# Patient Record
Sex: Male | Born: 1945
Health system: Southern US, Community
[De-identification: ages and names within clinical notes are randomized; demographics above are authoritative.]

## PROBLEM LIST (undated history)

## (undated) DIAGNOSIS — F29 Unspecified psychosis not due to a substance or known physiological condition: Secondary | ICD-10-CM

## (undated) DIAGNOSIS — I10 Essential (primary) hypertension: Secondary | ICD-10-CM

## (undated) DIAGNOSIS — I639 Cerebral infarction, unspecified: Secondary | ICD-10-CM

## (undated) DIAGNOSIS — F209 Schizophrenia, unspecified: Secondary | ICD-10-CM

## (undated) HISTORY — DX: Essential (primary) hypertension: I10

## (undated) HISTORY — DX: Unspecified psychosis not due to a substance or known physiological condition: F29

## (undated) HISTORY — DX: Cerebral infarction, unspecified: I63.9

## (undated) HISTORY — DX: Schizophrenia, unspecified: F20.9

---

## 1972-12-07 DIAGNOSIS — F209 Schizophrenia, unspecified: Secondary | ICD-10-CM

## 1972-12-07 HISTORY — DX: Schizophrenia, unspecified: F20.9

## 2008-08-26 ENCOUNTER — Encounter (INDEPENDENT_AMBULATORY_CARE_PROVIDER_SITE_OTHER): Payer: Self-pay | Admitting: Internal Medicine

## 2008-08-26 ENCOUNTER — Inpatient Hospital Stay (HOSPITAL_COMMUNITY): Admission: EM | Admit: 2008-08-26 | Discharge: 2008-09-03 | Payer: Self-pay | Admitting: Emergency Medicine

## 2008-08-26 ENCOUNTER — Ambulatory Visit: Payer: Self-pay | Admitting: Internal Medicine

## 2008-09-21 ENCOUNTER — Encounter (INDEPENDENT_AMBULATORY_CARE_PROVIDER_SITE_OTHER): Payer: Self-pay | Admitting: Internal Medicine

## 2008-09-25 ENCOUNTER — Encounter: Payer: Self-pay | Admitting: Internal Medicine

## 2008-10-16 ENCOUNTER — Ambulatory Visit: Payer: Self-pay | Admitting: Infectious Diseases

## 2008-10-16 DIAGNOSIS — F29 Unspecified psychosis not due to a substance or known physiological condition: Secondary | ICD-10-CM | POA: Insufficient documentation

## 2008-10-16 DIAGNOSIS — F1911 Other psychoactive substance abuse, in remission: Secondary | ICD-10-CM

## 2008-10-16 DIAGNOSIS — I10 Essential (primary) hypertension: Secondary | ICD-10-CM | POA: Insufficient documentation

## 2008-10-16 DIAGNOSIS — R269 Unspecified abnormalities of gait and mobility: Secondary | ICD-10-CM

## 2008-10-16 DIAGNOSIS — D649 Anemia, unspecified: Secondary | ICD-10-CM

## 2008-10-16 DIAGNOSIS — R5381 Other malaise: Secondary | ICD-10-CM

## 2008-10-16 DIAGNOSIS — R5383 Other fatigue: Secondary | ICD-10-CM

## 2008-11-15 ENCOUNTER — Ambulatory Visit: Payer: Self-pay | Admitting: Internal Medicine

## 2008-12-07 DIAGNOSIS — I639 Cerebral infarction, unspecified: Secondary | ICD-10-CM

## 2008-12-07 HISTORY — DX: Cerebral infarction, unspecified: I63.9

## 2009-01-02 ENCOUNTER — Telehealth (INDEPENDENT_AMBULATORY_CARE_PROVIDER_SITE_OTHER): Payer: Self-pay | Admitting: *Deleted

## 2009-02-14 ENCOUNTER — Ambulatory Visit: Payer: Self-pay | Admitting: Internal Medicine

## 2009-02-14 ENCOUNTER — Telehealth: Payer: Self-pay | Admitting: Internal Medicine

## 2009-02-14 ENCOUNTER — Encounter: Payer: Self-pay | Admitting: Internal Medicine

## 2009-02-14 LAB — CONVERTED CEMR LAB
BUN: 19 mg/dL (ref 6–23)
CO2: 24 meq/L (ref 19–32)
Chloride: 106 meq/L (ref 96–112)
Creatinine, Ser: 1.52 mg/dL — ABNORMAL HIGH (ref 0.40–1.50)
Glucose, Bld: 106 mg/dL — ABNORMAL HIGH (ref 70–99)

## 2009-03-05 ENCOUNTER — Telehealth (INDEPENDENT_AMBULATORY_CARE_PROVIDER_SITE_OTHER): Payer: Self-pay | Admitting: *Deleted

## 2009-04-01 ENCOUNTER — Telehealth: Payer: Self-pay | Admitting: Internal Medicine

## 2009-04-15 ENCOUNTER — Telehealth: Payer: Self-pay | Admitting: Internal Medicine

## 2009-05-14 ENCOUNTER — Telehealth: Payer: Self-pay | Admitting: Internal Medicine

## 2009-05-29 ENCOUNTER — Telehealth: Payer: Self-pay | Admitting: Internal Medicine

## 2009-06-03 ENCOUNTER — Telehealth: Payer: Self-pay | Admitting: Internal Medicine

## 2009-07-08 ENCOUNTER — Ambulatory Visit: Payer: Self-pay | Admitting: Surgery

## 2009-07-08 ENCOUNTER — Emergency Department (HOSPITAL_COMMUNITY): Admission: EM | Admit: 2009-07-08 | Discharge: 2009-07-08 | Payer: Self-pay | Admitting: Emergency Medicine

## 2009-07-08 ENCOUNTER — Telehealth: Payer: Self-pay | Admitting: *Deleted

## 2009-07-08 ENCOUNTER — Encounter (INDEPENDENT_AMBULATORY_CARE_PROVIDER_SITE_OTHER): Payer: Self-pay | Admitting: Emergency Medicine

## 2009-07-30 ENCOUNTER — Ambulatory Visit: Payer: Self-pay | Admitting: Infectious Disease

## 2009-07-30 ENCOUNTER — Inpatient Hospital Stay (HOSPITAL_COMMUNITY): Admission: EM | Admit: 2009-07-30 | Discharge: 2009-08-05 | Payer: Self-pay | Admitting: Emergency Medicine

## 2009-07-30 ENCOUNTER — Ambulatory Visit: Payer: Self-pay | Admitting: Internal Medicine

## 2009-07-30 ENCOUNTER — Encounter (INDEPENDENT_AMBULATORY_CARE_PROVIDER_SITE_OTHER): Payer: Self-pay | Admitting: Internal Medicine

## 2009-07-31 ENCOUNTER — Encounter: Payer: Self-pay | Admitting: *Deleted

## 2009-08-01 ENCOUNTER — Encounter: Payer: Self-pay | Admitting: Internal Medicine

## 2009-08-02 ENCOUNTER — Ambulatory Visit: Payer: Self-pay | Admitting: Physical Medicine & Rehabilitation

## 2009-08-02 ENCOUNTER — Encounter: Payer: Self-pay | Admitting: Internal Medicine

## 2009-08-02 ENCOUNTER — Ambulatory Visit: Payer: Self-pay | Admitting: Vascular Surgery

## 2009-08-02 ENCOUNTER — Encounter (INDEPENDENT_AMBULATORY_CARE_PROVIDER_SITE_OTHER): Payer: Self-pay | Admitting: Internal Medicine

## 2009-08-05 ENCOUNTER — Inpatient Hospital Stay (HOSPITAL_COMMUNITY)
Admission: RE | Admit: 2009-08-05 | Discharge: 2009-08-16 | Payer: Self-pay | Admitting: Physical Medicine & Rehabilitation

## 2009-08-20 ENCOUNTER — Encounter: Payer: Self-pay | Admitting: *Deleted

## 2009-09-02 ENCOUNTER — Telehealth: Payer: Self-pay | Admitting: Internal Medicine

## 2009-09-03 ENCOUNTER — Encounter
Admission: RE | Admit: 2009-09-03 | Discharge: 2009-09-10 | Payer: Self-pay | Admitting: Physical Medicine & Rehabilitation

## 2009-09-04 ENCOUNTER — Encounter: Payer: Self-pay | Admitting: Internal Medicine

## 2009-09-04 ENCOUNTER — Ambulatory Visit: Payer: Self-pay | Admitting: Vascular Surgery

## 2009-09-10 ENCOUNTER — Ambulatory Visit: Payer: Self-pay | Admitting: Physical Medicine & Rehabilitation

## 2009-10-04 ENCOUNTER — Encounter: Payer: Self-pay | Admitting: Internal Medicine

## 2009-10-17 ENCOUNTER — Ambulatory Visit: Payer: Self-pay | Admitting: Internal Medicine

## 2009-10-17 DIAGNOSIS — I69959 Hemiplegia and hemiparesis following unspecified cerebrovascular disease affecting unspecified side: Secondary | ICD-10-CM

## 2009-10-21 ENCOUNTER — Encounter: Payer: Self-pay | Admitting: Licensed Clinical Social Worker

## 2009-11-04 ENCOUNTER — Encounter: Payer: Self-pay | Admitting: Licensed Clinical Social Worker

## 2009-12-13 ENCOUNTER — Telehealth: Payer: Self-pay | Admitting: Internal Medicine

## 2010-02-14 ENCOUNTER — Telehealth: Payer: Self-pay | Admitting: Internal Medicine

## 2010-02-18 ENCOUNTER — Telehealth: Payer: Self-pay | Admitting: Internal Medicine

## 2010-06-16 ENCOUNTER — Telehealth: Payer: Self-pay | Admitting: Internal Medicine

## 2010-06-17 ENCOUNTER — Telehealth: Payer: Self-pay | Admitting: Internal Medicine

## 2010-07-18 ENCOUNTER — Telehealth (INDEPENDENT_AMBULATORY_CARE_PROVIDER_SITE_OTHER): Payer: Self-pay | Admitting: *Deleted

## 2010-08-06 ENCOUNTER — Ambulatory Visit: Payer: Self-pay | Admitting: Internal Medicine

## 2010-08-06 DIAGNOSIS — R143 Flatulence: Secondary | ICD-10-CM

## 2010-08-06 DIAGNOSIS — R141 Gas pain: Secondary | ICD-10-CM

## 2010-08-06 DIAGNOSIS — R142 Eructation: Secondary | ICD-10-CM

## 2010-08-06 LAB — CONVERTED CEMR LAB

## 2010-08-07 ENCOUNTER — Encounter: Payer: Self-pay | Admitting: Internal Medicine

## 2010-08-07 ENCOUNTER — Telehealth: Payer: Self-pay | Admitting: *Deleted

## 2010-08-13 ENCOUNTER — Ambulatory Visit: Payer: Self-pay | Admitting: Internal Medicine

## 2010-08-13 ENCOUNTER — Ambulatory Visit (HOSPITAL_COMMUNITY): Admission: RE | Admit: 2010-08-13 | Discharge: 2010-08-13 | Payer: Self-pay | Admitting: Internal Medicine

## 2010-08-13 ENCOUNTER — Encounter: Payer: Self-pay | Admitting: Internal Medicine

## 2010-08-13 LAB — CONVERTED CEMR LAB
Basophils Relative: 0 % (ref 0–1)
MCHC: 32.8 g/dL (ref 30.0–36.0)
Monocytes Relative: 11 % (ref 3–12)
Neutro Abs: 2.8 10*3/uL (ref 1.7–7.7)
Neutrophils Relative %: 54 % (ref 43–77)
RBC: 4.04 M/uL — ABNORMAL LOW (ref 4.22–5.81)
WBC: 5.2 10*3/uL (ref 4.0–10.5)

## 2010-08-14 LAB — CONVERTED CEMR LAB
ALT: 17 units/L (ref 0–53)
AST: 19 units/L (ref 0–37)
Albumin: 4.3 g/dL (ref 3.5–5.2)
BUN: 19 mg/dL (ref 6–23)
Calcium: 9.7 mg/dL (ref 8.4–10.5)
Chloride: 107 meq/L (ref 96–112)
Potassium: 4.7 meq/L (ref 3.5–5.3)

## 2010-10-10 ENCOUNTER — Telehealth: Payer: Self-pay | Admitting: Internal Medicine

## 2011-01-06 NOTE — Progress Notes (Signed)
Summary: Refill/gh  Phone Note Refill Request Message from:  Fax from Pharmacy on October 10, 2010 9:32 AM  Refills Requested: Medication #1:  KLOR-CON M20 20 MEQ CR-TABS Take 1 tablet by mouth two times a day   Last Refilled: 09/11/2010  Method Requested: Electronic Initial call taken by: Sander Nephew RN,  October 10, 2010 9:32 AM  Follow-up for Phone Call       Follow-up by: Pershing Cox MD,  October 10, 2010 12:09 PM    Prescriptions: KLOR-CON M20 20 MEQ CR-TABS (POTASSIUM CHLORIDE CRYS CR) Take 1 tablet by mouth two times a day  #60 x 3   Entered and Authorized by:   Pershing Cox MD   Signed by:   Pershing Cox MD on 10/10/2010   Method used:   Electronically to        [facility] [phone]* (retail)       [place], [place]  [id number]       Ph: [phone] or [phone]       Fax: [phone]   RxID:   [id number]

## 2011-01-06 NOTE — Progress Notes (Signed)
Summary: med refill/gp  Phone Note Refill Request Message from:  Fax from Pharmacy on December 13, 2009 10:58 AM  Refills Requested: Medication #1:  KLOR-CON M20 20 MEQ CR-TABS Take 1 tablet by mouth two times a day   Last Refilled: 11/14/2009  Method Requested: Electronic Initial call taken by: Morrison Old RN,  December 13, 2009 10:58 AM  Follow-up for Phone Call       Follow-up by: Pershing Cox MD,  December 13, 2009 11:04 AM    Prescriptions: KLOR-CON M20 20 MEQ CR-TABS (POTASSIUM CHLORIDE CRYS CR) Take 1 tablet by mouth two times a day  #60 x 3   Entered and Authorized by:   Pershing Cox MD   Signed by:   Pershing Cox MD on 12/13/2009   Method used:   Electronically to        [facility] [phone]* (retail)       [place], [place]  [id number]       Ph: [phone] or [phone]       Fax: [phone]   RxID:   [id number]

## 2011-01-06 NOTE — Progress Notes (Signed)
Summary: refil/gg  Phone Note Refill Request  on June 16, 2010 3:23 PM  Refills Requested: Medication #1:  PLAVIX 75 MG TABS Take 1 tablet by mouth once daily..   Last Refilled: 05/14/2010  Medication #2:  HYDROCHLOROTHIAZIDE 25 MG TABS Take 1 tablet by mouth once a day   Last Refilled: 05/14/2010  Medication #3:  LISINOPRIL 20 MG TABS Take 1 tablet by mouth once a day   Last Refilled: 05/14/2010  Method Requested: Electronic Initial call taken by: Gevena Cotton RN,  June 16, 2010 3:23 PM  Follow-up for Phone Call        Pt needs to be seen in the clinic. We last refilled in march so I am not sure who refilled it until june. He has not been seen in the clinic in last 6 months.  Follow-up by: Pershing Cox MD,  June 16, 2010 8:36 PM    Prescriptions: PLAVIX 75 MG TABS (CLOPIDOGREL BISULFATE) Take 1 tablet by mouth once daily.  #30 x 3   Entered and Authorized by:   Pershing Cox MD   Signed by:   Pershing Cox MD on 06/16/2010   Method used:   Electronically to        [facility] [phone]* (retail)       [place], [place]  [id number]       Ph: [phone] or [phone]       Fax: [phone]   RxID:   [id number] HYDROCHLOROTHIAZIDE 25 MG TABS (HYDROCHLOROTHIAZIDE) Take 1 tablet by mouth once a day  #30 x 3   Entered and Authorized by:   Pershing Cox MD   Signed by:   Pershing Cox MD on 06/16/2010   Method used:   Electronically to        [facility] [phone]* (retail)       [place], [place]  [id number]       Ph: [phone] or [phone]       Fax: [phone]   RxID:   [id number] LISINOPRIL 20 MG TABS (LISINOPRIL) Take 1 tablet by mouth once a day  #30 x 3   Entered and Authorized by:   Pershing Cox MD   Signed by:   Pershing Cox MD on 06/16/2010   Method used:   Electronically to        [facility] [phone]* (retail)       [place], [place]  [id number]       Ph: [phone] or [id number]       Fax: [phone]   RxID:   [id number]

## 2011-01-06 NOTE — Progress Notes (Signed)
Summary: REfill/gh  Phone Note Refill Request Message from:  Fax from Pharmacy on February 14, 2010 12:27 PM  Refills Requested: Medication #1:  LISINOPRIL 20 MG TABS Take 1 tablet by mouth once a day   Last Refilled: 01/14/2010  Medication #2:  PLAVIX 75 MG TABS Take 1 tablet by mouth once daily..   Last Refilled: 01/14/2010  Medication #3:  HYDROCHLOROTHIAZIDE 25 MG TABS Take 1 tablet by mouth once a day   Last Refilled: 01/14/2010  Method Requested: Electronic Initial call taken by: Sander Nephew RN,  February 14, 2010 12:29 PM  Follow-up for Phone Call       Follow-up by: Pershing Cox MD,  February 14, 2010 2:11 PM    Prescriptions: METOPROLOL TARTRATE 100 MG TABS (METOPROLOL TARTRATE) Take 1 tablet by mouth two times a day  #60 x 4   Entered and Authorized by:   Pershing Cox MD   Signed by:   Pershing Cox MD on 02/14/2010   Method used:   Electronically to        [facility] [phone]* (retail)       [place], [place]  [id number]       Ph: [phone] or [phone]       Fax: [phone]   RxID:   [id number] PLAVIX 75 MG TABS (CLOPIDOGREL BISULFATE) Take 1 tablet by mouth once daily.  #30 x 3   Entered and Authorized by:   Pershing Cox MD   Signed by:   Pershing Cox MD on 02/14/2010   Method used:   Electronically to        [facility] [phone]* (retail)       [place], [place]  [id number]       Ph: [phone] or [phone]       Fax: [phone]   RxID:   [id number] KLOR-CON M20 20 MEQ CR-TABS (POTASSIUM CHLORIDE CRYS CR) Take 1 tablet by mouth two times a day  #60 x 3   Entered and Authorized by:   Pershing Cox MD   Signed by:   Pershing Cox MD on 02/14/2010   Method used:   Electronically to        [facility] [phone]* (retail)       [place], [place]  PL:[id number]       Ph:  [phone] or [phone]       Fax: [phone]   [id number] HYDROCHLOROTHIAZIDE 25 MG TABS (HYDROCHLOROTHIAZIDE) Take 1 tablet by mouth once a day  #30 x 3   Entered and Authorized by:   Pershing Cox MD   Signed by:   Pershing Cox MD on 02/14/2010   Method used:   Electronically to        [facility] [phone]* (retail)       [place], [place]  [id number]       Ph: [phone] or [id number]       Fax: [phone]   RxID:   [id number] LISINOPRIL 20 MG TABS (LISINOPRIL) Take 1 tablet by mouth once a day  #  30 x 3   Entered and Authorized by:   Pershing Cox MD   Signed by:   Pershing Cox MD on 02/14/2010   Method used:   Electronically to        [facility] [phone]* (retail)       [place], [place]  [id number]       Ph: [phone] or [phone]       Fax: [phone]   RxID:   [id number]

## 2011-01-06 NOTE — Progress Notes (Signed)
Summary: med refill/gp  Phone Note Refill Request Message from:  Fax from Pharmacy on July 18, 2010 10:34 AM  Refills Requested: Medication #1:  METOPROLOL TARTRATE 100 MG TABS Take 1 tablet by mouth two times a day   Last Refilled: 06/16/2010 Last appt. 10/17/09; has an appt. scheduled Aug 30,2011.   Method Requested: Electronic Initial call taken by: Morrison Old RN,  July 18, 2010 10:34 AM    Prescriptions: METOPROLOL TARTRATE 100 MG TABS (METOPROLOL TARTRATE) Take 1 tablet by mouth two times a day  #60 x 6   Entered and Authorized by:   Burman Freestone MD   Signed by:   Burman Freestone MD on 07/18/2010   Method used:   Electronically to        [facility] [phone]* (retail)       [place], [place]  [id number]       Ph: [phone] or [phone]       Fax: [phone]   RxID:   [id number]

## 2011-01-06 NOTE — Progress Notes (Signed)
Summary: refill/ hla  Phone Note Refill Request Message from:  Fax from Pharmacy on February 18, 2010 4:32 PM  Refills Requested: Medication #1:  NEURONTIN 100 MG CAPS one tablet three times daily   Last Refilled: 10/17/2009 Initial call taken by: Freddy Finner RN,  February 18, 2010 4:49 PM  Follow-up for Phone Call       Follow-up by: Pershing Cox MD,  February 20, 2010 1:59 PM    Prescriptions: NEURONTIN 100 MG CAPS (GABAPENTIN) one tablet three times daily  #90 x 3   Entered and Authorized by:   Pershing Cox MD   Signed by:   Pershing Cox MD on 02/20/2010   Method used:   Electronically to        [facility] [phone]* (retail)       [place], [place]  [id number]       Ph: [phone] or [phone]       Fax: [phone]   RxID:   [id number]

## 2011-01-06 NOTE — Progress Notes (Signed)
  Phone Note Outgoing Call   Call placed by: Lucky Rathke NT II,  August 07, 2010 2:23 PM Call placed to: Patient Details for Reason: ABD U/S Summary of Call: [facility] , [place]. THEN CALLED HIS DAUGHTER'S # [phone] LEFT VOICE MESSAGE WITH APPT INFO. LEFT MESSAGE FOR HER TO CALL BACK IF SHE HAS ANY QUESTIONS ABOUT THIS CALL. Lela Sturdivant NT II  August 07, 2010 2:24 PM

## 2011-01-06 NOTE — Progress Notes (Signed)
Summary: refill/ hla  Phone Note Refill Request Message from:  Fax from Pharmacy on June 17, 2010 12:07 PM  Refills Requested: Medication #1:  KLOR-CON M20 20 MEQ CR-TABS Take 1 tablet by mouth two times a day   Dosage confirmed as above?Dosage Confirmed   Last Refilled: 12/9  Medication #2:  NEURONTIN 100 MG CAPS one tablet three times daily   Last Refilled: 6/14 10/17/2009  Initial call taken by: Freddy Finner RN,  June 17, 2010 12:07 PM  Follow-up for Phone Call       Follow-up by: Pershing Cox MD,  June 17, 2010 7:59 PM    Prescriptions: NEURONTIN 100 MG CAPS (GABAPENTIN) one tablet three times daily  #90 x 3   Entered and Authorized by:   Pershing Cox MD   Signed by:   Pershing Cox MD on 06/17/2010   Method used:   Electronically to        [facility] [phone]* (retail)       [place], [place]  [id number]       Ph: [phone] or [phone]       Fax: [phone]   RxID:   [id number] KLOR-CON M20 20 MEQ CR-TABS (POTASSIUM CHLORIDE CRYS CR) Take 1 tablet by mouth two times a day  #60 x 3   Entered and Authorized by:   Pershing Cox MD   Signed by:   Pershing Cox MD on 06/17/2010   Method used:   Electronically to        [facility] [phone]* (retail)       [place], [place]  PL:[id number]       Ph: [phone] or [phone]       Fax: [phone]   RxID:   [id number]

## 2011-01-06 NOTE — Progress Notes (Signed)
  Phone Note Outgoing Call   Call placed by: Lucky Rathke NT II,  August 07, 2010 12:07 PM Call placed to: Patient Details for Reason: ABD ULTRASOUND Summary of Call: [place] [facility] PHONE, CALLED AND NO ANSWER APPT: [facility] 7, 011 AT [place] AT 8:45AM PATIENT TO BE NPO AFTERMIDNIGHT. Lela Sturdivant NT II  August 07, 2010 12:08 PM

## 2011-01-06 NOTE — Progress Notes (Signed)
Summary: Refill/gh  Phone Note Refill Request Message from:  Fax from Pharmacy on October 10, 2010 9:26 AM  Refills Requested: Medication #1:  PLAVIX 75 MG TABS Take 1 tablet by mouth once daily..   Last Refilled: 09/11/2010  Medication #2:  HYDROCHLOROTHIAZIDE 25 MG TABS Take 1 tablet by mouth once a day   Last Refilled: 09/11/2010  Medication #3:  NEURONTIN 100 MG CAPS one tablet three times daily   Last Refilled: 09/11/2010  Medication #4:  LISINOPRIL 20 MG TABS Take 1 tablet by mouth once a day   Last Refilled: 09/11/2010  Method Requested: Electronic Initial call taken by: Sander Nephew RN,  October 10, 2010 9:26 AM  Follow-up for Phone Call       Follow-up by: Pershing Cox MD,  October 10, 2010 12:09 PM    Prescriptions: PLAVIX 75 MG TABS (CLOPIDOGREL BISULFATE) Take 1 tablet by mouth once daily.  #30 x 3   Entered and Authorized by:   Pershing Cox MD   Signed by:   Pershing Cox MD on 10/10/2010   Method used:   Electronically to        [facility] [phone]* (retail)       [place], [place]  [id number]       Ph: [phone] or [phone]       Fax: [phone]   RxID:   [id number] HYDROCHLOROTHIAZIDE 25 MG TABS (HYDROCHLOROTHIAZIDE) Take 1 tablet by mouth once a day  #30 x 3   Entered and Authorized by:   Pershing Cox MD   Signed by:   Pershing Cox MD on 10/10/2010   Method used:   Electronically to        [facility] [phone]* (retail)       [place], [place]  PL:[id number]       Ph: [phone] or [phone]       Fax: [phone]   RxID:   [id number] LISINOPRIL 20 MG TABS (LISINOPRIL) Take 1 tablet by mouth once a day  #30 x 3   Entered and Authorized by:   Pershing Cox MD   Signed by:   Pershing Cox MD on 10/10/2010   Method used:   Electronically to        [facility] [phone]* (retail)       [place], [place]  [id number]       Ph: [phone] or [id number]       Fax: [phone]   RxID:   [id number] FOLIC ACID 1 MG TABS (FOLIC ACID) Take 1 tablet by mouth once a day  #30 x 11   Entered and Authorized by:   Pershing Cox MD   Signed by:   Pershing Cox MD on 10/10/2010   Method used:   Electronically to        [facility] [phone]* (retail)       [place], [place]  PL:[id number]       Ph: [phone] or [phone]       Fax: [phone]   RxID:  [id number] NEURONTIN 100 MG CAPS (GABAPENTIN) one tablet three times daily  #90 x 3   Entered and Authorized by:   Pershing Cox MD   Signed by:   Pershing Cox MD on 10/10/2010   Method used:   Electronically to        [facility] [phone]* (retail)       [place], [place]  [id number]       Ph: [phone] or [phone]       Fax: [phone]   RxID:   [id number]
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
## 2011-02-10 ENCOUNTER — Other Ambulatory Visit: Payer: Self-pay | Admitting: Internal Medicine

## 2011-02-12 ENCOUNTER — Other Ambulatory Visit: Payer: Self-pay | Admitting: *Deleted

## 2011-02-12 MED ORDER — GABAPENTIN 100 MG PO CAPS: 100.0000 mg | ORAL_CAPSULE | Freq: Three times a day (TID) | ORAL | Status: DC

## 2011-02-12 MED ORDER — POTASSIUM CHLORIDE CRYS ER 20 MEQ PO TBCR: 20.0000 meq | EXTENDED_RELEASE_TABLET | Freq: Two times a day (BID) | ORAL | Status: DC

## 2011-02-12 MED ORDER — LISINOPRIL 20 MG PO TABS: 20.0000 mg | ORAL_TABLET | Freq: Every day | ORAL | Status: DC

## 2011-02-12 MED ORDER — HYDROCHLOROTHIAZIDE 25 MG PO TABS: 25.0000 mg | ORAL_TABLET | Freq: Every day | ORAL | Status: DC

## 2011-02-12 MED ORDER — CLOPIDOGREL BISULFATE 75 MG PO TABS: 75.0000 mg | ORAL_TABLET | Freq: Every day | ORAL | Status: DC

## 2011-03-13 LAB — BASIC METABOLIC PANEL
BUN: 19 mg/dL (ref 6–23)
Creatinine, Ser: 1.54 mg/dL — ABNORMAL HIGH (ref 0.4–1.5)
GFR calc non Af Amer: 46 mL/min — ABNORMAL LOW (ref >60)
Potassium: 4 mEq/L (ref 3.5–5.1)

## 2011-03-14 LAB — COMPREHENSIVE METABOLIC PANEL
ALT: 16 U/L (ref 0–53)
ALT: 16 U/L (ref 0–53)
AST: 23 U/L (ref 0–37)
Albumin: 4.1 g/dL (ref 3.5–5.2)
Alkaline Phosphatase: 65 U/L (ref 39–117)
BUN: 23 mg/dL (ref 6–23)
CO2: 24 mEq/L (ref 19–32)
CO2: 25 mEq/L (ref 19–32)
Calcium: 9.5 mg/dL (ref 8.4–10.5)
Chloride: 102 mEq/L (ref 96–112)
Chloride: 106 mEq/L (ref 96–112)
GFR calc Af Amer: 57 mL/min — ABNORMAL LOW (ref >60)
GFR calc non Af Amer: 45 mL/min — ABNORMAL LOW (ref >60)
GFR calc non Af Amer: 47 mL/min — ABNORMAL LOW (ref >60)
Glucose, Bld: 86 mg/dL (ref 70–99)
Potassium: 3.9 mEq/L (ref 3.5–5.1)
Sodium: 135 mEq/L (ref 135–145)
Sodium: 138 mEq/L (ref 135–145)
Total Bilirubin: 0.5 mg/dL (ref 0.3–1.2)
Total Protein: 7.2 g/dL (ref 6.0–8.3)

## 2011-03-14 LAB — DIFFERENTIAL
Basophils Absolute: 0.1 10*3/uL (ref 0.0–0.1)
Basophils Relative: 1 % (ref 0–1)
Basophils Relative: 1 % (ref 0–1)
Eosinophils Absolute: 0.3 10*3/uL (ref 0.0–0.7)
Lymphs Abs: 1.3 10*3/uL (ref 0.7–4.0)
Monocytes Relative: 8 % (ref 3–12)
Neutro Abs: 2.6 10*3/uL (ref 1.7–7.7)
Neutro Abs: 4.8 10*3/uL (ref 1.7–7.7)
Neutrophils Relative %: 49 % (ref 43–77)
Neutrophils Relative %: 70 % (ref 43–77)

## 2011-03-14 LAB — CBC
HCT: 35.5 % — ABNORMAL LOW (ref 39.0–52.0)
Hemoglobin: 11.9 g/dL — ABNORMAL LOW (ref 13.0–17.0)
MCHC: 33.5 g/dL (ref 30.0–36.0)
MCV: 93 fL (ref 78.0–100.0)
Platelets: 257 10*3/uL (ref 150–400)
RBC: 3.74 MIL/uL — ABNORMAL LOW (ref 4.22–5.81)
RBC: 3.82 MIL/uL — ABNORMAL LOW (ref 4.22–5.81)
RDW: 12.2 % (ref 11.5–15.5)
WBC: 6.9 10*3/uL (ref 4.0–10.5)

## 2011-03-14 LAB — BASIC METABOLIC PANEL
BUN: 28 mg/dL — ABNORMAL HIGH (ref 6–23)
BUN: 39 mg/dL — ABNORMAL HIGH (ref 6–23)
CO2: 25 mEq/L (ref 19–32)
CO2: 25 mEq/L (ref 19–32)
Calcium: 9.2 mg/dL (ref 8.4–10.5)
Calcium: 9.8 mg/dL (ref 8.4–10.5)
Chloride: 100 mEq/L (ref 96–112)
Creatinine, Ser: 1.68 mg/dL — ABNORMAL HIGH (ref 0.4–1.5)
Creatinine, Ser: 1.84 mg/dL — ABNORMAL HIGH (ref 0.4–1.5)
Creatinine, Ser: 2.03 mg/dL — ABNORMAL HIGH (ref 0.4–1.5)
GFR calc Af Amer: 45 mL/min — ABNORMAL LOW (ref >60)
GFR calc Af Amer: 50 mL/min — ABNORMAL LOW (ref >60)
GFR calc non Af Amer: 37 mL/min — ABNORMAL LOW (ref >60)
Glucose, Bld: 88 mg/dL (ref 70–99)

## 2011-03-14 LAB — URINALYSIS, ROUTINE W REFLEX MICROSCOPIC
Bilirubin Urine: NEGATIVE
Glucose, UA: NEGATIVE mg/dL
Glucose, UA: NEGATIVE mg/dL
Hgb urine dipstick: NEGATIVE
Ketones, ur: NEGATIVE mg/dL
Ketones, ur: NEGATIVE mg/dL
Protein, ur: NEGATIVE mg/dL
Specific Gravity, Urine: 1.015 (ref 1.005–1.030)
pH: 5 (ref 5.0–8.0)

## 2011-03-14 LAB — URINE CULTURE: Special Requests: NEGATIVE

## 2011-03-14 LAB — IRON AND TIBC
Saturation Ratios: 18 % — ABNORMAL LOW (ref 20–55)
TIBC: 244 ug/dL (ref 215–435)

## 2011-03-14 LAB — POCT I-STAT, CHEM 8
Creatinine, Ser: 1.9 mg/dL — ABNORMAL HIGH (ref 0.4–1.5)
Glucose, Bld: 104 mg/dL — ABNORMAL HIGH (ref 70–99)
Hemoglobin: 12.2 g/dL — ABNORMAL LOW (ref 13.0–17.0)
TCO2: 23 mmol/L (ref 0–100)

## 2011-03-14 LAB — FERRITIN: Ferritin: 488 ng/mL — ABNORMAL HIGH (ref 22–322)

## 2011-03-14 LAB — FOLATE: Folate: >20 ng/mL

## 2011-03-14 LAB — VITAMIN B12: Vitamin B-12: 1622 pg/mL — ABNORMAL HIGH (ref 211–911)

## 2011-03-14 LAB — LIPID PANEL
LDL Cholesterol: 107 mg/dL — ABNORMAL HIGH (ref 0–99)
Total CHOL/HDL Ratio: 4.2 RATIO
Triglycerides: 69 mg/dL (ref <150)
VLDL: 14 mg/dL (ref 0–40)

## 2011-03-14 LAB — GLUCOSE, CAPILLARY: Glucose-Capillary: 120 mg/dL — ABNORMAL HIGH (ref 70–99)

## 2011-03-14 LAB — RAPID URINE DRUG SCREEN, HOSP PERFORMED
Barbiturates: NOT DETECTED
Benzodiazepines: NOT DETECTED

## 2011-03-14 LAB — TSH: TSH: 0.45 u[IU]/mL (ref 0.350–4.500)

## 2011-03-14 LAB — CARDIAC PANEL(CRET KIN+CKTOT+MB+TROPI)
CK, MB: 3.9 ng/mL (ref 0.3–4.0)
Relative Index: 2.2 (ref 0.0–2.5)
Troponin I: 0.03 ng/mL (ref 0.00–0.06)

## 2011-03-14 LAB — CREATININE, URINE, RANDOM: Creatinine, Urine: 114.5 mg/dL

## 2011-03-14 LAB — RETICULOCYTES
RBC.: 4.11 MIL/uL — ABNORMAL LOW (ref 4.22–5.81)
Retic Ct Pct: 0.7 % (ref 0.4–3.1)

## 2011-03-14 LAB — HIV ANTIBODY (ROUTINE TESTING W REFLEX): HIV: NONREACTIVE
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
## 2011-04-21 NOTE — Discharge Summary (Signed)
NAMEDMITRI, TSO NO.:  [id number]   MEDICAL RECORD NO.:  [id number]          PATIENT TYPE:  INP   LOCATION:  6705                         FACILITY:  [facility]   PHYSICIAN:  Alcide Evener, MD  DATE OF BIRTH:  [birth year]   DATE OF ADMISSION:  07/30/2009  DATE OF DISCHARGE:                               DISCHARGE SUMMARY   DATE OF DISCHARGE:  August 05, 2009.   DISCHARGE DIAGNOSES:  1. Acute infarct in the left temporal brain and extension into the      left frontal region.  2. Acute renal failure.  3. Normocytic anemia.  4. Hypertension.  5. Remote alcohol abuse.  6. Smoking.   DISCHARGE MEDICATIONS WITH ACCURATE DOSES:  1. Men's multivitamin plus tablets take 1 tablet by mouth once a day.  2. Neurontin 100 mg capsules 1 tablet 3 times a day.  3. Folic acid 1 mg tablets 1 tablet by mouth once daily.  4. Metoprolol tartrate 100 mg tablets 1 tablet by mouth 2 times a day.  5. Lisinopril 20 mg tablets 1 tablet by mouth once a day.  6. Hydrochlorothiazide 25 mg tablets 1 tablet by mouth once a day.  7. Klor-Con M20 20 mEq CR tablets take 1 tablet by mouth 2 times a      day.  8. Plavix 75 mg tablets 1 tablet by mouth once daily.   DISPOSITION AND FOLLOW UP:  The patient is to be transferred to [place] and he also has an appointment with Dr. Manuella Ghazi  on September 13 at 1:50 p.m. and he is to follow with a 2-D Doppler and  echo results and the regular followup visit.   PROCEDURES PERFORMED:  1. CT head without contrast was done on July 30, 2009.  Stable,      noncontrast appearance of the brain and no acute intracranial      abnormality.  2. Chest x-ray done July 30, 2009.  No acute disease.  3. X-ray of rip, right, done on July 31, 2009.  There is no evidence      of any hip fracture or dislocation.  It was negative.  4. MRA head without contrast done on July 31, 2009, suggestive of      acute infarction of the left  temporoparietal lobe extending over      the convexity and anteriorly into the left frontal lobe.  This      pattern arises the possibility of a watershed infarct due to      hypoperfusion in the anterior condition on the left.  Cerebral      emboli are another possibility.  Also, moderate-to-advanced      intracranial atherosclerotic disease most severe in the anterior      circulation.  5. A 2-D echo and Doppler results are pending, look for an addendum to      this discharge summary to no further results of that.   CONSULTATIONS:  No consultations were done during the inpatient. See the  addendum for details.   ADMITTING HISTORY AND PHYSICAL:  Mr. Cappella is a 65 year old man with  past medical history of left extremity weakness, hypertension,  hyperlipidemia, possible schizophrenia accompanied by his sister who  presents with progressive weakness of right leg and numbness of right  arm and leg.  He reports that he has had left extremity weakness for  approximately 9 months and has been using a cane or a walker to get  around.  For the past few days, he has only been able to get to the  bathroom and back and today, he was not able to get out of bed.  He does  note 3-4 falls over the last month and notes his right leg just gave out  on him at 1 time.  He denies any injuries associated with these falls.  Of note, history is inconsistent at times.  He denies headache, vision  change, altered mental status, dysphagia, and loss of consciousness.  Then, asked about his slurred speech and right facial droop, he reports  his speech has been slurred off and on and it is no different from his  usual speech.  In terms of his facial droop, he reports this is a new  finding.  He denies any other complaints including shortness of breath,  cough and nausea, vomiting, diarrhea, and urinary symptoms.   PHYSICAL EXAMINATION AT THE TIME OF PRESENTATION:  VITAL SIGNS:  Temperature 98.4, pulse 107,  blood pressure 150/92, respirations 19, and  oxygen saturation 100% on 2 L.  GENERAL:  Alert, interactive, mild slurred speech with right facial  droop, pleasant, no distress.  HEENT: Normocephalic, atraumatic.  Mucous moist membranes.  Poor  dentition, multiple teeth missing.  NECK:  No carotid bruits.  JVD of left adenopathy.  CARDIOVASCULAR:  Regular rate, slightly irregular rhythm.  Frequent PVCs  on monitor.  No murmurs, rubs, or gallops.  LUNGS:  Clear to auscultation bilaterally.  Normal effort.  ABDOMEN:  Positive bowel sounds, soft, nontender, nondistended.  Ventral  hernia noted.  EXTREMITIES:  No peripheral edema.  Decreased DP pulses bilaterally.  Right lower extremity cooler than left.  Capillary refill appropriate.  Doppler, dorsalis pedis and posterior tibial on right.  Unable to  Doppler the dorsalis pedis pulse on the left.  Doppler, posterior tibial  pulse on the left.  Right hip was externally rotated.  SKIN:  Dry.  NEUROLOGIC:  Alert, oriented x3, slightly dysarthric, right facial  droop.  Cranial nerves II through XII intact except for cranial nerve  VII, which was slightly weak.  Decreased strength in right lower  extremity, completely unable to plantar flex.  Strength with hip flexion  significantly decreased 2/5, strength in right upper extremity 3/5.  Somewhat difficult to assess because he has difficulty with following  even 1-stage commands at times.  Finger-to-nose normal bilaterally.  Reflexes decreased and symmetric.  Unable to assess gait because the  patient is unable to stand.   HOSPITAL COURSE BY PROBLEM:  1. Acute stroke, the left side.  The differential diagnosis that were      considered at the time of admission were unclear, etiology was      unclear and the CT scan was negative for any acute stroke.  We      considered transient ischemic attack, neuropathy, thyroid      dysfunction on electrolyte abnormality as some of the potential      causes  of development of new onset weakness and slurred speech.      Electrolytes were normal.  Thyroid  was normal.  There was no      significant neuropathy noted.  He had an MRI showing significant      vascular disease less than 1 year ago, so he was definitely a risk      factor for another cardiovascular events, so we went ahead and did      an MRI of his head and the results were consistent with acute left-      sided stroke.  The patient was monitored on telemetry initially,      but then he was transferred to floor.  There were no acute events      during the hospital stay.  The patient got physiotherapy and      occupational therapy consults and he was doing well.  The condition      did not deteriorate during the hospital course of stay and the      patient was transferred to the rehab facility of [facility] in view of continued followup with PT and OT.  2. Acute renal failure.  Creatinine during previous admission was 1.5,      so we considered an element of chronic renal failure.  The      creatinine during the hospital a course of stay did not      deteriorate.  It was around 1.5, so we did hold ACE inhibitors and      diuretics for initial one day, but then we restarted them and there      was no deterioration in renal function during the hospital course      of stay.  3. Normocytic anemia.  He was followed up with anemia panel.  Anemia      panel was consistent with low percentage saturation of 18%, mildly      low.  We did not start any medication and decided to continue with      folic acid therapy in view of chronic alcohol abuse.  4. Hypertension.  He continued the metoprolol and ACE inhibitors were      held because of possible acute renal failure, but was restarted at      the time of discharge.  5. Remote alcohol abuse.  We continued him on folic acid.  6. For smoking, counseling was provided during the hospital stay.  7. For DVT prophylaxis, he was started  on heparin.   DISCHARGE LABORATORY DATA AND VITALS:  Temperature 98.5, pulse 76,  respirations 16, systolic blood pressure 123XX123, diastolic 73, and oxygen  saturation 96% on 2 L.   CRP was 0.6.  HIV antibody nonreactive.  TSH was 0.450.  Urine drug  screen was positive for tetrahydrocannabinols.  ESR was high at 35.  CMET is from July 30, 2009, sodium 138, potassium 4.1, chloride 106,  carbon dioxide 24, glucose 109, BUN 14, creatinine 1.51, total bilirubin  0.6, alkaline phosphate 74, SGOT 23, SGPT 16, total protein 7.7, albumin  bound 4.1, and calcium 9.5.  CBC:  WBC 6.9, RBC 3.74, hemoglobin 11.7,  hematocrit 34.5, MCV 92.0, MCHC 33.9, RDW 12.8, and platelets 281.      Janell Quiet, MD  Electronically Signed      Alcide Evener, MD  Electronically Signed    AG/MEDQ  D:  08/01/2009  T:  08/02/2009  Job:  [id number]   cc:   Jessy Oto. Oneida Alar, MD

## 2011-04-21 NOTE — Procedures (Signed)
CAROTID DUPLEX EXAM   INDICATION:  Re-evaluate left carotid.   HISTORY:  Diabetes:  No.  Cardiac:  No.  Hypertension:  No.  Smoking:  Previous.  Previous Surgery:  No.  CV History:  History of mini stroke with right lower extremity residual  symptoms.  Amaurosis Fugax No, Paresthesias No, Hemiparesis Yes                                       RIGHT             LEFT  Brachial systolic pressure:  Brachial Doppler waveforms:         Normal            Normal  Vertebral direction of flow:                          Antegrade  DUPLEX VELOCITIES (cm/sec)  CCA peak systolic                                     88  ECA peak systolic                                     Q000111Q  ICA peak systolic                                     Q000111Q  ICA end diastolic                                     121  PLAQUE MORPHOLOGY:                                    Heterogeneous  PLAQUE AMOUNT:                                        Severe  PLAQUE LOCATION:                                      ICA/ECA/CCA   IMPRESSION:  Doppler velocity suggests an 80-99% stenosis of the left  proximal internal carotid artery.   ___________________________________________  Jessy Oto Fields, MD   CH/MEDQ  D:  09/04/2009  T:  09/04/2009  Job:  [id number]

## 2011-04-21 NOTE — Assessment & Plan Note (Signed)
OFFICE VISIT   Daniel [name], Daniel [name]  DOB:  24-Mar-1946                                       09/04/2009  CHART#:[id number]   Patient is a 65 year old male who was previously seen in the hospital at  the time of a left brain stroke.  This was in late August 2010.  At that  time, he had a fairly dense right hemiplegia and it was decided to wait  several weeks to consider carotid endarterectomy.  Carotid duplex at the  time of his hospitalization showed a 60 to 80% left internal carotid  artery stenosis.   He returns for follow-up today.  He is currently on Plavix for stroke  prophylaxis.  Other atherosclerotic risk factors include hypertension  and elevated cholesterol, smoking, prior stroke.   He reports no new neurologic symptoms since his stroke.  He states he  has made significant improvement in physical therapy and is able to walk  currently with a walker.  He still has some weakness in his right arm or  leg, but they are improved significantly.   MEDICATIONS:  Plavix 75 mg once a day, Crestor 20 mg q.h.s., Lopressor  100 mg twice a day, ferrous sulfate 325 mg twice a day, multivitamin  once a day, Norvasc 5 mg once a day, gabapentin 300 mg t.i.d., folate 1  mg once a day.   ALLERGIES:  He has no known drug allergies.   FAMILY HISTORY:  Unremarkable.   SOCIAL HISTORY:  He is single.  Currently smokes 1 cigarette per day but  has been a heavier smoker in the past.  He slowed down on his smoking  considerably in 1999.  He does not consume alcohol regularly.  He does  have a history of perhaps some type of psychosis or schizophrenia.   REVIEW OF SYSTEMS:  He is 5 foot 6, 170 pounds.  Cardiac, pulmonary, GI,  renal, neurologic, orthopedic, psychiatric, ENT, and hematologic review  of systems are otherwise negative.  Vascular system:  He has some  occasional pain in his legs with walking, but this does not sound like  claudication.   PHYSICAL EXAMINATION:   Blood pressure is 192/93 in the left arm, 172/91  in the right arm.  Pulse is 60 and regular.  HEENT is unremarkable.  Neck:  He has a left carotid bruit.  Carotid pulses are 2+ bilaterally.  Chest is clear to auscultation.  Cardiac:  A regular rate and rhythm  without murmur.  Abdomen is soft, nontender, nondistended.  No mass.  Extremities:  He has 2+ brachial, radial, femoral pulses bilaterally.  He has no lower extremity edema.  He has no ulcerations on the feet.  Neurologic exam shows 4/5 right upper extremity and right lower  extremity motor strength.  He also has a right foot drop.  He has some  difficulty raising his right shoulder and extending his right arm  completely.   I had a lengthy discussion with Daniel [name] today regarding the need for  left carotid endarterectomy for further stroke prophylaxis.  He  currently is refusing this.  I informed him today that his risk of  stroke is higher with medical therapy than with an operation.  He still  continues to refuse the operation.  Also because of the question of  possible mental illness in the past, I discussed  all of these findings  and treatment options with his sister, Daniel [name], who was here for the  office visit today as well.  She understands his risk of stroke also.  Currently patient is refusing to have carotid endarterectomy.  He will  follow up on an as-needed basis.  He will call if he wishes to schedule  this at some point in the future.   Jessy Oto. Fields, MD  Electronically Signed   CEF/MEDQ  D:  09/04/2009  T:  09/05/2009  Job:  [id number]   cc:   Charlett Blake, M.D.  Alcide Evener, MD  Pershing Cox, MD PhD

## 2011-04-21 NOTE — Consult Note (Signed)
Daniel [name], Daniel NO.:  [id number]   MEDICAL RECORD NO.:  [id number]          PATIENT TYPE:  INP   LOCATION:  6705                         FACILITY:  [facility]   PHYSICIAN:  Jessy Oto. Fields, MD  DATE OF BIRTH:  19-Oct-1946   DATE OF CONSULTATION:  08/02/2009  DATE OF DISCHARGE:                                 CONSULTATION   REQUESTING PHYSICIAN:  Alcide Evener, MD   REASON FOR CONSULTATION:  Left internal carotid artery stenosis in the  face of stroke.   HISTORY OF PRESENT ILLNESS:  The patient is a 65 year old male with  recent history of a left brain stroke manifesting with symptoms of right  hemiplegia.  He had a carotid duplex scan recently which showed 60-80%  left internal carotid artery stenosis.  He had no significant right  internal carotid artery stenosis.  Atherosclerotic risk factors include  hypertension and tobacco abuse as well as elevated cholesterol.  The  patient states he has had right-sided weakness for approximately 9  months, but this acutely got worse this week.  He was using a cane to  walk and now is requiring a walker to be ambulatory.   PAST MEDICAL HISTORY:  Psychosis, schizophrenia, renal insufficiency,  anemia.   REVIEW OF SYSTEMS:  Negative for full review of systems including GI,  renal, neuro, pulmonary, cardiac, endocrine, musculoskeletal, overall  general, as well as all other systems.   MEDICATIONS:  Multivitamin, Neurontin, folate, aspirin, Lopressor,  Crestor, Plavix.   SOCIAL HISTORY:  History of alcohol abuse in the past.  Tobacco history  as mentioned above, lives with his sister currently.   PHYSICAL EXAM:  VITAL SIGNS:  Blood pressure is 140/72, heart rate 76,  temperature 98.4.  GENERAL:  Black male in no acute distress, alert, oriented x3.  HEENT:  Unremarkable.  NECK:  2+ carotid pulses bilaterally.  CHEST:  Clear to auscultation.  CARDIAC:  Regular rate and rhythm.  ABDOMEN:  Soft, nontender,  nondistended.  EXTREMITIES:  He has 2+ radial pulses bilaterally.  NEUROLOGIC:  Right upper extremity is 3/5 motor, right lower extremity  is 0/5 motor, left upper extremity is 4/5 motor, left lower extremity  4/5 motor.   Review of his MRI of the brain shows a large frontal and parietal  infarct on the left side.   ASSESSMENT:  Large left parietal, frontal infarct with hemiplegia and  internal carotid artery stenosis on the left side.  I will consider left  carotid endarterectomy in 4-6 weeks as his risk of intracranial  hemorrhage would be high  currently with revascularizations in the setting of this large stroke.  I spoke with the patient and his sister and will schedule followup for  them in 1 month.  Sister was also given the address of our office as  well as the phone number.  He is okay for him to continue on his Plavix  for now for stroke prophylaxis.      Jessy Oto. Fields, MD  Electronically Signed     CEF/MEDQ  D:  08/02/2009  T:  08/03/2009  Job:  [id number]

## 2011-04-21 NOTE — Consult Note (Signed)
Daniel [name], BLYDEN NO.:  [id number]   MEDICAL RECORD NO.:  [id number]          PATIENT TYPE:  INP   LOCATION:  [id number]                         FACILITY:  [facility]   PHYSICIAN:  Felizardo Hoffmann, M.D.  DATE OF BIRTH:  10/30/1947   DATE OF CONSULTATION:  08/29/2008  DATE OF DISCHARGE:                                 CONSULTATION   REQUESTING PHYSICIAN:  Grayland Jack Phifer, MD   REASON FOR CONSULTATION:  Psychosis and mental status changes.   HISTORY OF PRESENT ILLNESS:  [name] is a 65 year old male  admitted to the [facility] on August 25, 2008, due to evaluation  of hypertensive crisis.   [name] presented with bilateral lower extremity weakness.  At home,  he has been having frequent falls.  He also has been displaying  hallucinations.  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  He also demonstrates impaired judgment.   Upon admission, [name] did have a presentation of alcohol dependence  and was placed on the Ativan protocol for withdrawal.   His hallucinations were occurring for approximately 2 weeks.  They have  now resolved.  However, he does have the chronic delusion as mentioned  above.   [name] is cooperative with bedside care.  He is noncombative.  He is  redirectable.   PAST PSYCHIATRIC HISTORY:  In review of the past medical record, there  is no account of [name] psychiatric admission or treatment.  However, his family stated to the general medical team that he was  admitted to [facility] for 2 weeks in the 1980s.   The patient cannot recall what psychotropic medication that he was  receiving.   FAMILY PSYCHIATRIC HISTORY:  None known.   SOCIAL HISTORY:  [name] has been drinking 2 glasses of gin per day  or beer or wine daily.  He resides with his 2 sisters.  Occupation:  Unemployed.  He denies any use of illegal drugs.  However, his urine  drug screen is positive  for marijuana.   Mr. Rhew has not received any medical care for several years.  He is  from [place].  He has 2 brothers and 7 sisters.  He is not married.  He has no children.   PAST MEDICAL HISTORY:  High blood pressure.   MEDICATIONS:  MAR is reviewed.  He is on a multivitamin daily, thiamine  100 mg daily, Haldol 1 mg q.4 h. p.r.n., Ambien 5 mg nightly p.r.n.   ALLERGIES:  He has no known drug allergies.   An MRI without contrast as well as a CT without contrast showed  nonspecific white matter changes.   Sodium 133, BUN 5, creatinine 0.87, glucose 116, WBC 7.5, hemoglobin  11.8, platelet count 278, SGOT 30, SGPT 32.   INR, RPR, HIV, ammonia, folic acid, hepatitis panel, ASA, magnesium,  alcohol, TSH:  All unremarkable.   Urine drug screen positive for marijuana.   REVIEW OF SYSTEMS:  Constitutional,  HEENT,  mouth, neurologic,  psychiatric, cardiovascular, respiratory, gastrointestinal,  genitourinary, skin,  musculoskeletal, hematologic, lymphatic, endocrine,  metabolic:  All unremarkable.   PHYSICAL EXAMINATION:  VITAL SIGNS:  Temperature 97.9, pulse 84,  respiratory rate 20, blood pressure 125/80, O2 saturation on room air  99%.  GENERAL APPEARANCE:  [name] is a middle-aged male sitting up in his  hospital chair with no abnormal involuntary movements.   MENTAL STATUS EXAM:  [name] alternates between eye contact with the  undersigned and scribbling on his notepad.  He is making large lines in  a diagonal pattern.   His attention span is mildly decreased.  His concentration is mildly  decreased.  Affect involves inappropriate laughter at times.  Abstraction testing:  He fails 3/4 proverbs.  His mood is grossly within  normal limits other than the inappropriate laughter that he displays at  times.  On orientation testing, he is completely intact except for the  day of the month.  He thinks it is September 05, 2008.  Memory testing:  3/3 words immediate,  0/3 words on recall.  His speech involves normal  rate and prosody without dysarthria except when his prosody is  interrupted by some inappropriate laughter occasionally.  Thought  process involves some vague speech.  There is no looseness of  associations.  There is occasional illogical thought content, no  thoughts of harming himself, no thoughts of harming others, no  hallucinations.  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  This was thoroughly explored, and it does not  represent a primitive manner in which he describes how he is paid.  He  describes a process of working in [employer] office.   Insight is poor.  Judgment is impaired.   ASSESSMENT:  Axis I:  293.81  Psychotic disorder, not otherwise  specified.  [name] displays at least partial criteria for  schizophrenia, chronic, undifferentiated type.  His delusion is not  likely to respond to antipsychotic medication.    294.9  Unspecified persistent mental disorder, not otherwise  specified.  [name] does have a form of dementia.  It may be primary.  Axis II:  None.  Axis III:  See past medical history.  Axis IV:  General medical primary support group.  Axis V:  30.   [name] does display critical deficits in memory as well as the  ability to appreciate his risks.  He has impaired reasoning.   [name] does not have the capacity for informed consent.  He does not  have the capacity to choose his environment after discharge.   RECOMMENDATIONS:  Would schedule [name] an appointment with an  outpatient psychiatrist.  Clinics available:  [facility] or [facility].  This will allow monitoring of his mental  status and possibly an antipsychotic trial, although please see the  discussion above.   The outpatient psychiatric appointment would also allow further mental  status screening as he continues to receive his thiamine.  Some cases of   alcoholism with nutritional deficit will continue to respond to thiamine  over 3-4 weeks.      Felizardo Hoffmann, M.D.  Electronically Signed     JW/MEDQ  D:  08/29/2008  T:  08/29/2008  Job:  [id number]

## 2011-04-21 NOTE — Discharge Summary (Signed)
NAMEPARLEY, [name] NO.:  [id number]   MEDICAL RECORD NO.:  [id number]          PATIENT TYPE:  INP   LOCATION:  6705                         FACILITY:  [facility]   PHYSICIAN:  Daniel Evener, MD  DATE OF BIRTH:  [birth year]   DATE OF ADMISSION:  07/30/2009  DATE OF DISCHARGE:  08/05/2009                               DISCHARGE SUMMARY   ADDENDUM   DISCHARGE DIAGNOSES:  1. Acute stroke, left sided.  2. Acute renal failure.  3. Normocytic anemia.  4. Hypertension.  5. Remote alcohol abuse.  6. Tobacco abuse.   DISCHARGE MEDICATIONS:  With accurate doses, please see the dictated  discharge on August 01, 2009.   DISPOSITION AND FOLLOWUP:  The patient is to follow up with Dr. Manuella [name] in  the outpatient clinic on August 19, 2009, at 1:50 p.m. and he has to  follow up with the patient's recovery and continue medication  compliance.  The patient also has to follow up with Dr. Ruta [name],  vascular surgeon in 1 month time from the discharge.  The patient's  sister has the information for Dr. Ruta [name]' office and she was  told to schedule an appointment with him in 1 month time.   PROCEDURES PERFORMED:  Other than the procedures that has been dictated  on the previous discharge summary on August 01, 2009, the patient  underwent 2D echocardiography. 2D echocardiography on August 02, 2009,  left ventricle was hyperdynamic with near cavity obliteration and  systole.  The cavity size was normal.  Wall thickness was normal.  Systolic function was vigorous.  The estimated ejection fraction was in  the range of 65-70%.  Doppler parameters are consistent with abnormal  left ventricular relaxation and grade 1 diastolic dysfunction.  Echocardiography, M-mode complete, 2D - spectral Doppler and color  Doppler.  The patient status was inpatient.  Location was bedside.  There was carotid Doppler done on August 02, 2009, study was routine.  The conclusion of the study  was EBI on right demonstrated 0.37 and on  the left ABI demonstrated 0.37.  Other specific details can be found on  the table.  No DVT was noted.  Neurovascular carotid exam for the  Doppler was done on August 02, 2009.  The carotid duplex exam was  suggestive of mild-to-moderate mixed plaque on the posterior wall and  bifurcation extending into the proximal segment of the internal carotid  artery this was on the right side. There was 60-79% stenosis noted in  proximal segment of left internal carotid artery, moderate mixed plaque  noted in the bifurcation and into the proximal internal carotid artery.   CONSULTATIONS:  Dr. Ruta [name], vascular surgeon was consulted for  carotid Doppler abnormalities and he suggested that the patient should  go carotid endarterectomy after 4-6 weeks.   ADMITTING HISTORY OF PRESENT ILLNESS:  Same as dictated on the previous  discharge.   HOSPITAL COURSE:  As dictated in the previous discharge summary on  August 01, 2009.  Other than that, the patient did not have any  significant events over the next  3 days of his hospital stay.  1. Left sided weakness:The patient's slurred speech and the patient's      strength improved with physical therapy during the hospital course      of stay and the patient was transferred to rehab in the [facility] on August 05, 2009.  2. Acute on Chronic RF: The patient's labs improved and on the day of      discharge, his creatinine level was 1.68 as compared to the      creatinine level of 2 before.  The patient was making good recovery      for his loss of renal function as noted by improvement in the      creatinine.   DISCHARGING VITALS AND LABS:  On August 05, 2009, temperature 98.4,  pulse 69, respirations 20, systolic blood pressure [id number], diastolic blood  pressure 81, oxygen saturation 100% on room air.  His labs, sodium 135,  potassium 3.7, chloride 102, carbon dioxide 225, glucose 95, BUN 28,   creatinine 1.68, GFR 41, calcium 9.2.      Daniel Quiet, MD  Electronically Signed      Daniel Evener, MD  Electronically Signed    AG/MEDQ  D:  08/05/2009  T:  08/06/2009  Job:  [id number]   cc:   Daniel [name]. Daniel Alar, MD  Daniel Cox, MD PhD

## 2011-04-24 NOTE — Discharge Summary (Signed)
Daniel [name], YAP NO.:  [id number]   MEDICAL RECORD NO.:  [id number]          PATIENT TYPE:  INP   LOCATION:  3002                         FACILITY:  [facility]   PHYSICIAN:  Evette Doffing, M.D.  DATE OF BIRTH:  10/30/1947   DATE OF ADMISSION:  08/25/2008  DATE OF DISCHARGE:  09/03/2008                               DISCHARGE SUMMARY   DISCHARGE DIAGNOSES:  1. Lower extremity weakness, likely secondary to ischemic dementia.  2. Psychosis with schizophrenia.  3. Hypertension.  4. Hypotension.  5. Vitamin B12 deficiency.  6. Anemia.  7. Nonspecific T-wave abnormalities.   DISCHARGE MEDICATIONS:  1. Metoprolol 100 mg twice daily.  2. Lisinopril 20 mg once daily.  3. Hydrochlorothiazide 25 mg once daily.  4. Amlodipine 5 mg once daily.  5. Aspirin 325 mg once daily.  6. Neurontin 100 mg 3 times a day for nerve pain.  7. Multivitamin 1 tablet daily.  8. K-Dur 40 mEq once daily.  9. Folic acid 1 mg once daily.   DISPOSITION AND FOLLOWUP:  The patient to return to outpatient care to  see Dr. Manuella Ghazi in 1 month.  Appointment date would be obtained by the  patient's family by calling [phone].  Plan to obtain CBC, to follow  anemia and BMET, to follow electrolytes after starting metoprolol and  lisinopril as well as hydrochlorothiazide.   PROCEDURES PERFORMED:  Lumbar puncture was performed by Dr. Manuella Ghazi and Dr.  Ermalinda Memos on the bedside.  Procedure did not result in successful obtaining  of CSF fluid for further analysis.  The patient had no complications  from the procedure.  The patient then was referred to Interventional  Radiology for lumbar puncture under fluoroscopy.  The patient had  collection of CSF for analysis under Interventional Radiology procedure.   CONSULTATIONS:  Psychiatry, Daniel [name], M.D. from the [facility].   BRIEF ADMITTING HISTORY AND PHYSICAL:  A 65 year old African-American  male without known significant medical  history, who presented to  emergency department with complaint of fall and progressive weakness for  last 1 week.  The patient had 3 falls in last week.  The last fall  happened on the day of admission.  In the morning, the patient fell down  near bathtub, could not easily get up, and required family help.  Family  called EMS, and he was brought to the emergency department for further  evaluation.  The patient remained conscious before the fall and after  the fall.  The patient was hurt on the left hand.  The patient denies  having chest pain, shortness of breath, nausea or vomiting, diarrhea,  urinary incontinence, headache, loss of consciousness, or seizure-like  activity.  The patient was incoherent at the time of admission.  Sister  who came to the ER to drop him off was contacted on the phone.  Further  history was acquired from sister.  Sister informed us that he was having  severe gait disturbance for last 1 week.  He has a history of substance  abuse including marijuana and alcohol.  She also confirmed  all of the  history obtained from the patient.   ALLERGIES:  The patient has no known drug allergies.   PHYSICAL EXAMINATION:  VITALS:  On presentation, temperature 97.4, blood  pressure 180/91, pulse of 116, respiratory rate of 18, O2 saturation of  99% on room air.  GENERAL:  The patient appeared disheveled, sitting on the edge of the  bed and uncomfortable.  HEENT:  EOMI, muddy sclerae, PERRLA.  NECK:  Supple, full range of motion.  No prominent mass palpitated.  RESPIRATION:  Good air entry bilaterally.  No wheezing.  CARDIOVASCULAR:  Normal S1, S2.  No murmur.  GI:  Soft and nontender belly, no varicose veins, no fluid observed.  No  hepatosplenomegaly.  EXTREMITIES:  Left hand bruises and leg bruises.  No pedal edema.  SKIN:  No rashes.  LYMPH NODE:  No lymphadenopathy.  NEUROLOGICAL:  No focal deficits.  Cranial nerves were grossly normal.  Motor 5/5, hypotonia, and  decreased tone in the legs.  Reflexes are  present.  Sensation normal in both legs and both arms.  Gait abnormal,  wide, spastic, and unsteady.  PSYCH:  Normal affect and mood.   LABORATORY DATA:  Sodium 136, potassium 3.5, chloride 106, bicarb of 17,  BUN of 3, creatinine of 0.9, glucose of 107, anion gap of 13, bilirubin  1.1, alkaline phosphatase 118, SGOT 40, SGPT 32, protein 6.7, albumin  3.5, calcium 8.9.  UDS positive for THC, alcohol level less than 5.  Urinalysis clear.  Point-of-care cardiac enzymes, myoglobin of 361,  troponin less than 0.05, CK-MB 8, PT of 13.3, INR is 1, blood ammonia  level is 20.  CT of head, no acute intracranial abnormality.  Chronic  microvascular changes but advanced for age.   Chest x-ray borderline heart size, mild hyperinflation, no active  disease.   Hemoglobin 12.1, white blood cell count of 6.5, platelet of 289, ANC of  4.7, MCV of 106.3.  MRA showed suspicion of high-grade stenosis and  right middle cerebral artery occlusions, possible moderate stenosis of  right vertebrobasilar junction, probable high-grade stenosis of the left  posterior cerebral artery at its proximal aspect.  MRI of the brain  showed no evidence of acute ischemia, nonspecific subcortical white  matter changes supratentorially, likely ischemic gliosis related to  small-vessel disease due to hypertension and/or diabetes, possible  demyelinating process or vasculitis.  Mild-to-moderate thickening of  mucosa in ethmoid air cells and frontal sinuses.  CSF showed no anomalies for IgG in serum or CSF culture.  No WBCs were  seen.  No organisms were seen and no growth were seen in 3 days.  CSF-  VDRL remained negative.  On cell count and differential of CSF, color  was colorless, clarity was clear, WBC count was 0, and RBC count was 80,  likely secondary to hemorrhaging  puncture.  On protein and glucose,  glucose was 63 while total protein was mildly elevated at 54, normal   range is 15-45.   HOSPITAL COURSE:  1. Lower extremity weakness, gait disturbance likely secondary to      ischemic dementia and gliosis.  The patient was admitted with      unstable gait.  The patient was evaluated clinically with CT and      MRI.  The results clearly indicated no acute process.  The patient      had no medical attention for last 20 years.  The patient had a      severe and likely chronic  hypertension for many years that was      untreated.  His CT scan showed ischemic gliosis secondary to      chronic small vessel disease likely precipitated by hypertension.      He also is a chronic alcoholic which may have also contributed to      this process.  The patient had a gait disturbance that persisted      through his admission.  The patient, at times, did not realize his      fall risk, tried to get up to use bathroom, and fell down on      several occasions.  The patient had a sitter available to prevent      further falls.  The patient's family was contacted and requested to      be at the bedside to ensure his safety.  It remained difficult to      contact family on a regular basis and to help them participate in      the patient's care.  Clinical Social Work was consulted.  Family      meeting was arranged to discuss his care.  We remained concerned      about his safety and explained to the family that he needs 24x7      help for his day-to-day activity.  Mono was      suggested.  The patient and his family did not agree to such      placement.  Dr. Rhona Raider from [facility] was also      contacted to evaluate his psychiatric condition as well as to      facilitate the patient's care.  The patient's hypertension was      addressed with antihypertensive medications.  Along with CT and      MRI, the patient was evaluated for neurosyphilis with RPR for HIV      infection, cryptococcal infection which was negative.  Clinical      Social  Work advised that family would be helped in the patient's      care by making social worker as well as Marine scientist available at home      health.  The patient was discharged home with clear instructions to      family to provide continuous observation and help to ensure the      patient's safety.  Social Work as well as the Marine scientist aid would also      document the patient's well-being inside the home in future.  The      patient will be closely followed at the Outpatient Clinic with      appointment to be made by family with Dr. Manuella Ghazi.  2. Alcohol withdrawal.  The patient had history of alcohol abuse.  On      admission, the patient's alcohol level was less than 5.  The      patient was incoherent and generally malnourished.  The patient did      not demonstrate any tremors, agitation, confusion at the time of      exam.  The patient was put on a watch for alcohol withdrawal and      lorazepam if withdrawal became more apparent during the course of      admission.  The patient was given Ativan for agitation,      approximately 5-6 times over 3 days of admission.  The patient was      provided banana bag containing glucose and  vitamin B12 after      providing thiamine.  The patient was also provided magnesium      supplementation.  The patient was provided substance abuse      counseling.  3. The patient was progressively hypertensive at the time of      admission.  The patient's systolic blood pressure remained around      [id number] while diastolic blood pressure climbed up to 110-117.  He      also became tachycardic with pulse of about 112.  The patient's      blood pressure was initially treated with nitroglycerin drip.  The      patient was then transferred to oral medication regimen that      included metoprolol 100 mg twice daily, lisinopril 20 mg once      daily, hydrochlorothiazide 25 mg once daily, amlodipine 5 mg once      daily.  The patient's blood pressure at the time of discharge was       well controlled.  The patient will continue to use the same      medications at home.  4. Psychosis with schizophrenia.  The patient, on admission,      repeatedly informed me that he works at the [employer].      This information was not true as per family as well as his health      records.  The patient also demonstrated lack of understanding and      safety by frequent falls.  The patient had mini-mental status exam.      He scored 20 on this exam, clearly demonstrating lack of ability to      recall as well as carry out complex command.  The patient was      referred to [facility].  Dr. Rhona Raider was      consulted.  He evaluated the patient and determined that he did not      need active antipsychotic medications as his delusion was unlikely      to respond to them.  He also determined that the patient required      24x7 supervision secondary to lack of capacity to self care.      According to his advice, the Ativan was discontinued and Haldol was      started for agitation.  5. Hypotension.  On September 01, 2008, the patient had systolic blood      pressure in 0000000 and diastolic blood pressure in 50s.  This      transient hypotension corrected itself.  No active management was      required for his hypotension.  6. Anemia.  The patient had a hemoglobin between 11-15 throughout the      admission.  The patient had no active bleeding.  The patient had no      occult blood in the stool.  His MCV was more than 106.5.  This was      likely secondary to alcoholism.  The patient was not actively      treated for anemia while inpatient.  This would be followed up at      outpatient clinic.  His vitamin B12 level was 737, folate was 6.4,      ferritin 168, iron was 38, total iron-binding capacity was 180,      retic count of 1.4%, absolute retic count 42.6.  Based on the      anemia panel result, the patient likely was  having anemia secondary      to chronic  disease and alcoholism.  7. Nonspecific T-wave anomaly.  On admission, EKG had nonspecific T-      wave anomalies and no previous EKG was available to compare.  His      point-of-care cardiac markers remained negative as well as the      cardiac enzymes that were cycled later on.  The patient had no      complaint of chest pain.  This was likely secondary to chronic      hypertension.  We will continue to monitor his EKG.   On the day of the discharge, the patient's vitals were as following:  Temperature of 98.1, pulse of 85, respirations of 18, systolic pressure  of [id number], diastolic of 70, O2 saturation 99% on room air.   On September 02, 2008, his white blood cell count was 6.1, hemoglobin of  11.7, MCV of 106.5 with platelet of 343.  Sodium was 136, potassium was  4.5, chloride was 104, bicarb was 22, BUN was 94, creatinine 1.48.      Pershing Cox, MD  Electronically Signed      Evette Doffing, M.D.  Electronically Signed    RS/MEDQ  D:  09/12/2008  T:  09/13/2008  Job:  [id number]   cc:   Outpatient Clinic  Daniel [name], M.D.

## 2011-06-03 ENCOUNTER — Encounter: Payer: Self-pay | Admitting: Internal Medicine

## 2011-07-22 ENCOUNTER — Other Ambulatory Visit: Payer: Self-pay | Admitting: *Deleted

## 2011-07-22 NOTE — Telephone Encounter (Signed)
Pharmacy is requesting [facility] Men's Multi-Vit Tablet

## 2011-07-22 NOTE — Telephone Encounter (Signed)
Has not been seen since 8/11 and reading last note he really needs close F/U. Will ask that he make appt - first available.

## 2011-07-31 ENCOUNTER — Ambulatory Visit: Payer: Self-pay | Admitting: Internal Medicine

## 2011-08-04 ENCOUNTER — Ambulatory Visit: Payer: Self-pay | Admitting: Internal Medicine

## 2011-08-12 ENCOUNTER — Ambulatory Visit: Payer: Self-pay | Admitting: Internal Medicine

## 2011-08-17 ENCOUNTER — Ambulatory Visit (INDEPENDENT_AMBULATORY_CARE_PROVIDER_SITE_OTHER): Payer: Medicare Other | Admitting: Internal Medicine

## 2011-08-17 ENCOUNTER — Encounter: Payer: Self-pay | Admitting: Internal Medicine

## 2011-08-17 DIAGNOSIS — I10 Essential (primary) hypertension: Secondary | ICD-10-CM

## 2011-08-17 DIAGNOSIS — E785 Hyperlipidemia, unspecified: Secondary | ICD-10-CM | POA: Insufficient documentation

## 2011-08-17 DIAGNOSIS — I69959 Hemiplegia and hemiparesis following unspecified cerebrovascular disease affecting unspecified side: Secondary | ICD-10-CM

## 2011-08-17 MED ORDER — CLOPIDOGREL BISULFATE 75 MG PO TABS: 75.0000 mg | ORAL_TABLET | Freq: Every day | ORAL | Status: DC

## 2011-08-17 MED ORDER — METOPROLOL TARTRATE 100 MG PO TABS: 100.0000 mg | ORAL_TABLET | Freq: Two times a day (BID) | ORAL | Status: DC

## 2011-08-17 MED ORDER — LISINOPRIL 20 MG PO TABS: 20.0000 mg | ORAL_TABLET | Freq: Every day | ORAL | Status: DC

## 2011-08-17 MED ORDER — HYDROCHLOROTHIAZIDE 25 MG PO TABS: 25.0000 mg | ORAL_TABLET | Freq: Every day | ORAL | Status: DC

## 2011-08-17 MED ORDER — FOLIC ACID 1 MG PO TABS: 1.0000 mg | ORAL_TABLET | Freq: Every day | ORAL | Status: DC

## 2011-08-17 MED ORDER — GABAPENTIN 100 MG PO CAPS: 100.0000 mg | ORAL_CAPSULE | Freq: Three times a day (TID) | ORAL | Status: DC

## 2011-08-17 MED ORDER — MULTI-VITAMIN/MINERALS PO TABS: 1.0000 | ORAL_TABLET | Freq: Every day | ORAL | Status: DC

## 2011-08-17 MED ORDER — POTASSIUM CHLORIDE CRYS ER 20 MEQ PO TBCR: 20.0000 meq | EXTENDED_RELEASE_TABLET | Freq: Two times a day (BID) | ORAL | Status: DC

## 2011-08-17 NOTE — Progress Notes (Signed)
Subjective:   Patient ID: Daniel [name] male   DOB: 09-18-1946 65 y.o.   MRN: [id number]  HPI: Daniel [name] is a 65 y.o. man who presents to clinic today for refill of his medications.  He has not been seen in clinic for over a year.  He states that he has been taking his medications as prescribed.  He denies any side effects from his medications and states that overall he has been feeling well.  His sister is present with him and states that he has been doing well since his last visit.   Past Medical History  Diagnosis Date  . Schizophrenia 1974  . Stroke 2010    Right Hemiparesis  . Hypertension   . Psychosis    Current Outpatient Prescriptions  Medication Sig Dispense Refill  . folic acid (FOLVITE) 1 MG tablet Take 1 mg by mouth daily.        . Multiple Vitamins-Minerals (MULTIVITAMIN WITH MINERALS) tablet Take 1 tablet by mouth daily.        . clopidogrel (PLAVIX) 75 MG tablet Take 1 tablet (75 mg total) by mouth daily.  30 tablet  5  . gabapentin (NEURONTIN) 100 MG capsule Take 1 capsule (100 mg total) by mouth 3 (three) times daily.  90 capsule  5  . hydrochlorothiazide 25 MG tablet Take 1 tablet (25 mg total) by mouth daily.  30 tablet  11  . lisinopril (PRINIVIL,ZESTRIL) 20 MG tablet Take 1 tablet (20 mg total) by mouth daily.  30 tablet  5  . metoprolol (LOPRESSOR) 100 MG tablet TAKE 1 TABLET BY MOUTH TWO TIMES A DAY  60 tablet  6  . potassium chloride SA (KLOR-CON M20) 20 MEQ tablet Take 1 tablet (20 mEq total) by mouth 2 (two) times daily.  60 tablet  5   No family history on file. History   Social History  . Marital Status: Single    Spouse Name: N/A    Number of Children: N/A  . Years of Education: N/A   Social History Main Topics  . Smoking status: Current Everyday Smoker -- 0.1 packs/day    Types: Cigarettes  . Smokeless tobacco: None  . Alcohol Use: None  . Drug Use: None  . Sexually Active: None   Other Topics Concern  . None   Social History  Narrative   Lives with 2 sisters. Has clear signs of psychosis, being cared for younger sister.    Review of Systems: Constitutional: Denies fever, chills, diaphoresis, appetite change and fatigue.  HEENT: Denies photophobia, eye pain, redness, hearing loss, ear pain, congestion, sore throat, rhinorrhea, sneezing, mouth sores, trouble swallowing, neck pain, neck stiffness and tinnitus.   Respiratory: Denies SOB, DOE, cough, chest tightness,  and wheezing.   Cardiovascular: Denies chest pain, palpitations and leg swelling.  Gastrointestinal: Denies nausea, vomiting, abdominal pain, diarrhea, constipation, blood in stool and abdominal distention.  Genitourinary: Denies dysuria, urgency, frequency, hematuria, flank pain and difficulty urinating.  Musculoskeletal: Denies myalgias, back pain, joint swelling, arthralgias and gait problem.  Skin: Denies pallor, rash and wound.  Neurological: Denies dizziness, seizures, syncope, weakness, light-headedness, numbness and headaches.  Hematological: Denies adenopathy. Easy bruising, personal or family bleeding history  Psychiatric/Behavioral: Denies suicidal ideation, mood changes, confusion, nervousness, sleep disturbance and agitation  Objective:  Physical Exam: Filed Vitals:   08/17/11 1630  BP: 144/77  Pulse: 65  Temp: 98.5 F (36.9 C)  TempSrc: Oral  Height: 5' 8.9" (1.75 m)  Weight: 174 lb 12.8 oz (  79.289 kg)  SpO2: 98%   Constitutional: Vital signs reviewed.  Patient is a well-developed and well-nourished man in no acute distress and cooperative with exam. Alert and oriented x3.  Head: Normocephalic and atraumatic Ear: TM normal bilaterally Mouth: no erythema or exudates, MMM Eyes: PERRL, EOMI, conjunctivae normal, No scleral icterus.  Neck: Supple, Trachea midline normal ROM, No JVD, mass, thyromegaly, or carotid bruit present.  Cardiovascular: RRR, S1 normal, S2 normal, no MRG, pulses symmetric and intact bilaterally Pulmonary/Chest:  CTAB, no wheezes, rales, or rhonchi Abdominal: Soft. Non-tender, non-distended, bowel sounds are normal, no masses, organomegaly, or guarding present.  GU: no CVA tenderness Musculoskeletal: No joint deformities, erythema, or stiffness, ROM full and no nontender Hematology: no cervical, inginal, or axillary adenopathy.  Neurological: A&O x3, mild right hemiparesis noted Strength is normal on the left and 4/5 in the right.  cranial nerve II-XII are grossly intact, no focal motor deficit, sensory intact to light touch bilaterally.  Skin: Warm, dry and intact. No rash, cyanosis, or clubbing.  Psychiatric: Normal mood and affect. speech and behavior is normal. Judgment and thought content normal. Cognition and memory are normal.   Assessment & Plan:

## 2011-08-17 NOTE — Patient Instructions (Signed)
Please come back in the morning before breakfast to have your blood drawn.  If we need to follow up on anything I will give you a call.  Continue taking your medications as prescribed.  Follow up with [place] in 3-6 months.

## 2011-08-21 ENCOUNTER — Other Ambulatory Visit (INDEPENDENT_AMBULATORY_CARE_PROVIDER_SITE_OTHER): Payer: Medicare Other

## 2011-08-21 DIAGNOSIS — E785 Hyperlipidemia, unspecified: Secondary | ICD-10-CM

## 2011-08-21 DIAGNOSIS — I10 Essential (primary) hypertension: Secondary | ICD-10-CM

## 2011-08-21 LAB — COMPREHENSIVE METABOLIC PANEL
ALT: 10 U/L (ref 0–53)
AST: 16 U/L (ref 0–37)
CO2: 25 mEq/L (ref 19–32)
Calcium: 9.3 mg/dL (ref 8.4–10.5)
Chloride: 102 mEq/L (ref 96–112)
Sodium: 140 mEq/L (ref 135–145)
Total Bilirubin: 0.3 mg/dL (ref 0.3–1.2)
Total Protein: 7.8 g/dL (ref 6.0–8.3)

## 2011-08-21 LAB — LIPID PANEL: LDL Cholesterol: 99 mg/dL (ref 0–99)

## 2011-08-24 NOTE — Assessment & Plan Note (Addendum)
BP Readings from Last 3 Encounters:  08/17/11 144/77  08/06/10 140/72  10/17/09 183/86   Blood pressure today is stable from previous.  He is due for monitoring labs today but will be coming back fasting for his lipid panel so we will defer the draw until then.  We will follow up when his labs are available.   Basic Metabolic Panel:    Component Value Date/Time   NA 140 08/21/2011 0919   K 4.2 08/21/2011 0919   CL 102 08/21/2011 0919   CO2 25 08/21/2011 0919   BUN 23 08/21/2011 0919   CREATININE 1.97* 08/21/2011 0919   CREATININE 1.60* 08/13/2010 1012   GLUCOSE 94 08/21/2011 0919   CALCIUM 9.3 08/21/2011 0919   His creatinine is elevated.  He has no previously noted kidney dysfunction in his problem list but in reviewing his creatinine and GFR he has been in stage III CKD for sometime.  He will likely need a referral to nephrology at his follow up appointment if his creatinine continues to rise.

## 2011-08-24 NOTE — Assessment & Plan Note (Signed)
He has some mild residual right sided hemiparesis but is otherwise being managed for his risk factors and is doing relatively well.  We will continue to monitor and aggressively treat his risk factors.

## 2011-08-24 NOTE — Assessment & Plan Note (Addendum)
Lab Results  Component Value Date   CHOL 164 08/21/2011   CHOL  Value: 159        ATP III CLASSIFICATION:  <200     mg/dL   Desirable  200-239  mg/dL   Borderline High  >=240    mg/dL   High        07/31/2009   CHOL 159 07/31/2009   Lab Results  Component Value Date   HDL 36* 08/21/2011   HDL 38* 07/31/2009   HDL 38 07/31/2009   Lab Results  Component Value Date   LDLCALC 99 08/21/2011   LDLCALC  Value: 107        07/31/2009   LDLCALC 107 07/31/2009   Lab Results  Component Value Date   TRIG 146 08/21/2011   TRIG 69 07/31/2009   TRIG 69 07/31/2009   Lab Results  Component Value Date   CHOLHDL 4.6 08/21/2011   CHOLHDL 4.2 07/31/2009   No results found for this basename: LDLDIRECT   His LDL is below his goal of <100 but just barely.  We will encourage him to continue to work with his diet and weight loss.  If he continues to skirt the border at his follow up because of his history of CVA he may benefit from a low level statin therapy.

## 2011-09-07 LAB — SALICYLATE LEVEL: Salicylate Lvl: <4

## 2011-09-07 LAB — CK TOTAL AND CKMB (NOT AT ARMC): Relative Index: 4.5 — ABNORMAL HIGH

## 2011-09-07 LAB — OLIGOCLONAL BANDS, CSF + SERM
Albumin Index: 8.6 ratio (ref 0.0–9.0)
Albumin, CSF: 34 mg/dL (ref 0–35)
Albumin, Serum(Neph): 3940 mg/dL (ref 3500–5200)
CSF Oligoclonal Bands: NEGATIVE
IgG Index, CSF: 0.53 ratio (ref 0.28–0.66)
IgG, CSF: 5.3 mg/dL (ref 0.0–6.0)
IgG, Serum: 1150 mg/dL (ref 768–1632)
IgG/Albumin Ratio, CSF: 0.16 ratio (ref 0.09–0.25)
MS CNS IgG Synthesis Rate: 0 mg/d (ref <=8.0)

## 2011-09-07 LAB — BASIC METABOLIC PANEL
BUN: 22
BUN: 3 — ABNORMAL LOW
BUN: 4 — ABNORMAL LOW
BUN: 4 — ABNORMAL LOW
BUN: 5 — ABNORMAL LOW
BUN: 6
BUN: 7
CO2: 19
CO2: 22
CO2: 22
CO2: 24
Calcium: 8.4
Calcium: 8.8
Calcium: 8.9
Calcium: 9.5
Calcium: 9.5
Chloride: 102
Chloride: 103
Chloride: 104
Chloride: 108
Creatinine, Ser: 0.79
Creatinine, Ser: 0.87
Creatinine, Ser: 0.9
Creatinine, Ser: 0.92
Creatinine, Ser: 0.96
Creatinine, Ser: 1.48
GFR calc Af Amer: 58 — ABNORMAL LOW
GFR calc Af Amer: >60
GFR calc Af Amer: >60
GFR calc non Af Amer: 48 — ABNORMAL LOW
GFR calc non Af Amer: >60
GFR calc non Af Amer: >60
GFR calc non Af Amer: >60
Glucose, Bld: 100 — ABNORMAL HIGH
Glucose, Bld: 101 — ABNORMAL HIGH
Glucose, Bld: 94
Glucose, Bld: 95
Glucose, Bld: 99
Potassium: 3.4 — ABNORMAL LOW
Potassium: 4.1
Potassium: 4.5
Sodium: 131 — ABNORMAL LOW
Sodium: 136

## 2011-09-07 LAB — CBC
HCT: 35.2 — ABNORMAL LOW
HCT: 35.7 — ABNORMAL LOW
HCT: 38 — ABNORMAL LOW
Hemoglobin: 11.7 — ABNORMAL LOW
Hemoglobin: 11.8 — ABNORMAL LOW
Hemoglobin: 12.1 — ABNORMAL LOW
MCHC: 33.3
MCHC: 33.4
MCV: 106.3 — ABNORMAL HIGH
MCV: 106.7 — ABNORMAL HIGH
Platelets: 278
Platelets: 323
Platelets: 343
RBC: 3.41 — ABNORMAL LOW
RDW: 14.3
RDW: 14.6
RDW: 15
WBC: 7.1

## 2011-09-07 LAB — COMPREHENSIVE METABOLIC PANEL
CO2: 17 — ABNORMAL LOW
Calcium: 8.9
Creatinine, Ser: 0.9
GFR calc non Af Amer: >60
Glucose, Bld: 107 — ABNORMAL HIGH

## 2011-09-07 LAB — CSF CELL COUNT WITH DIFFERENTIAL: WBC, CSF: 0

## 2011-09-07 LAB — CARDIAC PANEL(CRET KIN+CKTOT+MB+TROPI)
CK, MB: 7 — ABNORMAL HIGH
Relative Index: 3.4 — ABNORMAL HIGH
Total CK: 205
Total CK: 248 — ABNORMAL HIGH
Troponin I: 0.03
Troponin I: 0.03

## 2011-09-07 LAB — DIFFERENTIAL
Lymphocytes Relative: 16
Lymphs Abs: 1
Neutro Abs: 4.7
Neutrophils Relative %: 73

## 2011-09-07 LAB — RAPID URINE DRUG SCREEN, HOSP PERFORMED: Benzodiazepines: NOT DETECTED

## 2011-09-07 LAB — LIPID PANEL
Cholesterol: 143
HDL: 42
LDL Cholesterol: 87
Triglycerides: 71

## 2011-09-07 LAB — IRON AND TIBC
Iron: 38 — ABNORMAL LOW
Saturation Ratios: 21
TIBC: 180 — ABNORMAL LOW
UIBC: 142

## 2011-09-07 LAB — POCT I-STAT 3, ART BLOOD GAS (G3+)
Acid-base deficit: 1
pO2, Arterial: 94

## 2011-09-07 LAB — URINALYSIS, ROUTINE W REFLEX MICROSCOPIC
Bilirubin Urine: NEGATIVE
Glucose, UA: NEGATIVE
Ketones, ur: NEGATIVE
Protein, ur: NEGATIVE

## 2011-09-07 LAB — VITAMIN B12: Vitamin B-12: 737 (ref 211–911)

## 2011-09-07 LAB — TSH: TSH: 0.783

## 2011-09-07 LAB — CRYPTOCOCCAL ANTIGEN: Crypto Ag: NEGATIVE

## 2011-09-07 LAB — POCT CARDIAC MARKERS
CKMB, poc: 8
Myoglobin, poc: 361

## 2011-09-07 LAB — CSF CULTURE W GRAM STAIN: Gram Stain: NONE SEEN

## 2011-09-07 LAB — AMMONIA: Ammonia: 20

## 2011-09-07 LAB — HEPATITIS PANEL, ACUTE: Hepatitis B Surface Ag: NEGATIVE

## 2011-09-07 LAB — TROPONIN I: Troponin I: 0.03

## 2011-09-07 LAB — PROTIME-INR
INR: 1
Prothrombin Time: 13.3

## 2011-09-07 LAB — LACTIC ACID, PLASMA: Lactic Acid, Venous: 1.6

## 2011-09-07 LAB — PROTEIN AND GLUCOSE, CSF: Glucose, CSF: 63

## 2011-09-07 LAB — FERRITIN: Ferritin: 868 — ABNORMAL HIGH (ref 22–322)

## 2012-03-14 ENCOUNTER — Other Ambulatory Visit: Payer: Self-pay | Admitting: Internal Medicine

## 2012-03-14 NOTE — Telephone Encounter (Signed)
Pt needs appt. I gave 2 month supply so that he has time to get appt with PCP. THnaks

## 2012-03-15 NOTE — Telephone Encounter (Signed)
Pt has an appt 04/18/12.

## 2012-04-12 ENCOUNTER — Other Ambulatory Visit: Payer: Self-pay | Admitting: Internal Medicine

## 2012-04-18 ENCOUNTER — Encounter: Payer: Self-pay | Admitting: Internal Medicine

## 2012-04-18 ENCOUNTER — Ambulatory Visit (INDEPENDENT_AMBULATORY_CARE_PROVIDER_SITE_OTHER): Payer: Medicare Other | Admitting: Internal Medicine

## 2012-04-18 VITALS — BP 140/72 | HR 63 | Temp 97.5°F | Ht 69.0 in | Wt 173.6 lb

## 2012-04-18 DIAGNOSIS — Z0000 Encounter for general adult medical examination without abnormal findings: Secondary | ICD-10-CM

## 2012-04-18 DIAGNOSIS — F1021 Alcohol dependence, in remission: Secondary | ICD-10-CM

## 2012-04-18 DIAGNOSIS — Z23 Encounter for immunization: Secondary | ICD-10-CM

## 2012-04-18 DIAGNOSIS — I10 Essential (primary) hypertension: Secondary | ICD-10-CM

## 2012-04-18 MED ORDER — LISINOPRIL 20 MG PO TABS: 20.0000 mg | ORAL_TABLET | Freq: Every day | ORAL | Status: DC

## 2012-04-18 MED ORDER — POTASSIUM CHLORIDE CRYS ER 20 MEQ PO TBCR: 20.0000 meq | EXTENDED_RELEASE_TABLET | Freq: Two times a day (BID) | ORAL | Status: DC

## 2012-04-18 MED ORDER — CLOPIDOGREL BISULFATE 75 MG PO TABS: 75.0000 mg | ORAL_TABLET | Freq: Every day | ORAL | Status: DC

## 2012-04-18 MED ORDER — GABAPENTIN 100 MG PO CAPS: 100.0000 mg | ORAL_CAPSULE | Freq: Three times a day (TID) | ORAL | Status: DC

## 2012-04-18 NOTE — Patient Instructions (Signed)
Please return to clinic in 3-4 months

## 2012-05-07 ENCOUNTER — Encounter: Payer: Self-pay | Admitting: Internal Medicine

## 2012-05-07 NOTE — Assessment & Plan Note (Signed)
140/72 on my recheck today. This is an ok pressure.  Continue current regimen, follow-up in 3 months

## 2012-05-07 NOTE — Assessment & Plan Note (Addendum)
Pneumovax given today.  Med refills given. Discussed FOBT cards with patient, he is going to do them at home

## 2012-05-07 NOTE — Assessment & Plan Note (Signed)
Pt reported he quit drinking 1 month ago.  Congratulated him on this

## 2012-05-07 NOTE — Progress Notes (Signed)
Subjective:   Patient ID: Daniel [name] male   DOB: 12-04-1946 66 y.o.   MRN: [id number]  HPI: [name] is a 66 y.o. with HTN here for follow-up.  He is doing well with no complaints.  He took his BP meds today.  He is here for refills of lisinopril, gabapentin, Plavix, and potassium.      Past Medical History  Diagnosis Date  . Schizophrenia 1974  . Stroke 2010    Right Hemiparesis  . Hypertension   . Psychosis    Current Outpatient Prescriptions  Medication Sig Dispense Refill  . clopidogrel (PLAVIX) 75 MG tablet Take 1 tablet (75 mg total) by mouth daily.  30 tablet  3  . folic acid (FOLVITE) 1 MG tablet Take 1 tablet (1 mg total) by mouth daily.  30 tablet  11  . gabapentin (NEURONTIN) 100 MG capsule Take 1 capsule (100 mg total) by mouth 3 (three) times daily.  90 capsule  3  . hydrochlorothiazide 25 MG tablet Take 1 tablet (25 mg total) by mouth daily.  30 tablet  11  . lisinopril (PRINIVIL,ZESTRIL) 20 MG tablet Take 1 tablet (20 mg total) by mouth daily.  30 tablet  3  . metoprolol (LOPRESSOR) 100 MG tablet Take 1 tablet (100 mg total) by mouth 2 (two) times daily.  60 tablet  6  . Multiple Vitamins-Minerals (MULTIVITAMIN WITH MINERALS) tablet Take 1 tablet by mouth daily.  30 tablet  11  . potassium chloride SA (KLOR-CON M20) 20 MEQ tablet Take 1 tablet (20 mEq total) by mouth 2 (two) times daily.  60 tablet  5   No family history on file. History   Social History  . Marital Status: Single    Spouse Name: N/A    Number of Children: N/A  . Years of Education: N/A   Social History Main Topics  . Smoking status: Current Everyday Smoker -- 0.1 packs/day    Types: Cigarettes  . Smokeless tobacco: None  . Alcohol Use: None  . Drug Use: None  . Sexually Active: None   Other Topics Concern  . None   Social History Narrative   Lives with 2 sisters. Has clear signs of psychosis, being cared for younger sister.    Review of Systems: Constitutional: Denies  fever, chills, diaphoresis, appetite change and fatigue.  Respiratory: Denies SOB, DOE, cough, chest tightness,  and wheezing.   Cardiovascular: Denies chest pain, palpitations and leg swelling.  Gastrointestinal: Denies nausea, vomiting, abdominal pain, diarrhea, constipation, blood in stool and abdominal distention.  Genitourinary: Denies dysuria, urgency, frequency, hematuria, flank pain and difficulty urinating.  Skin: Denies pallor, rash and wound.  Neurological: Denies dizziness, seizures, syncope, weakness, light-headedness, numbness and headaches.    Objective:  Physical Exam: Filed Vitals:   04/18/12 1439 04/18/12 1445  BP: 149/88 140/72  Pulse: 63   Temp: 97.5 F (36.4 C)   TempSrc: Oral   Height: 5\' 9"  (1.753 m)   Weight: 173 lb 9.6 oz (78.744 kg)    Constitutional: Vital signs reviewed.  Patient is a well-developed and well-nourished man in no acute distress and cooperative with exam. Alert and oriented x3.  Head: Normocephalic and atraumatic Mouth: no erythema or exudates, MMM Eyes: PERRL, EOMI, conjunctivae normal, No scleral icterus.  Neck: No JVD Cardiovascular: RRR, S1 normal, S2 normal, no MRG, pulses symmetric and intact bilaterally Pulmonary/Chest: CTAB, no wheezes, rales, or rhonchi Abdominal: Soft. Non-tender, non-distended, bowel sounds are normal, no masses, organomegaly, or guarding present.  Neurological: A&O x3, Strength is normal and symmetric bilaterally, cranial nerve II-XII are grossly intact, no focal motor deficit, sensory intact to light touch bilaterally.  Skin: Warm, dry and intact. No rash, cyanosis, or clubbing.    Assessment & Plan:

## 2012-06-13 ENCOUNTER — Other Ambulatory Visit: Payer: Self-pay | Admitting: Internal Medicine

## 2012-08-17 ENCOUNTER — Other Ambulatory Visit: Payer: Self-pay | Admitting: *Deleted

## 2012-08-17 MED ORDER — CLOPIDOGREL BISULFATE 75 MG PO TABS: 75.0000 mg | ORAL_TABLET | Freq: Every day | ORAL | Status: DC

## 2012-08-17 MED ORDER — LISINOPRIL 20 MG PO TABS: 20.0000 mg | ORAL_TABLET | Freq: Every day | ORAL | Status: DC

## 2012-09-12 ENCOUNTER — Other Ambulatory Visit: Payer: Self-pay | Admitting: Internal Medicine

## 2012-09-13 ENCOUNTER — Other Ambulatory Visit: Payer: Self-pay | Admitting: *Deleted

## 2012-09-13 MED ORDER — GABAPENTIN 100 MG PO CAPS: 100.0000 mg | ORAL_CAPSULE | Freq: Three times a day (TID) | ORAL | Status: DC

## 2012-10-12 ENCOUNTER — Other Ambulatory Visit: Payer: Self-pay | Admitting: *Deleted

## 2012-10-12 DIAGNOSIS — I10 Essential (primary) hypertension: Secondary | ICD-10-CM

## 2012-10-12 NOTE — Telephone Encounter (Signed)
Patient needs a CMP before KCl is refilled, he hasn't had his potassium checked in over a year and has kidney disease. Thanks. Please call him and inform him of need to have this blood test in next two weeks.

## 2012-10-17 NOTE — Telephone Encounter (Signed)
Flag sent to front desk pool - needs lab next 2 weeks per Dr Murlean Caller ( [place] ).

## 2012-10-26 ENCOUNTER — Encounter: Payer: Self-pay | Admitting: Internal Medicine

## 2012-10-27 ENCOUNTER — Other Ambulatory Visit: Payer: Self-pay | Admitting: *Deleted

## 2012-10-27 DIAGNOSIS — I10 Essential (primary) hypertension: Secondary | ICD-10-CM

## 2012-10-27 NOTE — Telephone Encounter (Signed)
Needs appointment and repeat CMET before refilled, I see none on file since 9/12.

## 2012-11-01 ENCOUNTER — Other Ambulatory Visit: Payer: Medicare Other

## 2012-11-07 ENCOUNTER — Other Ambulatory Visit: Payer: Medicare Other

## 2012-11-22 ENCOUNTER — Other Ambulatory Visit: Payer: Self-pay | Admitting: *Deleted

## 2012-11-23 ENCOUNTER — Other Ambulatory Visit: Payer: Self-pay | Admitting: *Deleted

## 2012-11-23 DIAGNOSIS — I10 Essential (primary) hypertension: Secondary | ICD-10-CM

## 2012-11-23 MED ORDER — GABAPENTIN 100 MG PO CAPS: 100.0000 mg | ORAL_CAPSULE | Freq: Three times a day (TID) | ORAL | Status: DC

## 2012-11-23 MED ORDER — HYDROCHLOROTHIAZIDE 25 MG PO TABS: 25.0000 mg | ORAL_TABLET | Freq: Every day | ORAL | Status: DC

## 2012-11-23 NOTE — Telephone Encounter (Signed)
Needs to have CMET drawn before Rx for KCl is given. He has chronic kidney disease. Thanks.

## 2012-11-28 NOTE — Telephone Encounter (Signed)
Talked with sister why potassium med was refused at this time. Pt has appt 12/08/12 - will wait and do labs on 12/08/12.

## 2012-12-08 ENCOUNTER — Ambulatory Visit: Payer: Medicare Other | Admitting: Internal Medicine

## 2012-12-19 ENCOUNTER — Other Ambulatory Visit: Payer: Self-pay | Admitting: *Deleted

## 2012-12-19 DIAGNOSIS — Z79899 Other long term (current) drug therapy: Secondary | ICD-10-CM

## 2012-12-19 MED ORDER — CLOPIDOGREL BISULFATE 75 MG PO TABS: 75.0000 mg | ORAL_TABLET | Freq: Every day | ORAL | Status: DC

## 2012-12-19 MED ORDER — LISINOPRIL 20 MG PO TABS: 20.0000 mg | ORAL_TABLET | Freq: Every day | ORAL | Status: DC

## 2012-12-19 NOTE — Telephone Encounter (Signed)
Schedule request sent to front desk.

## 2012-12-19 NOTE — Telephone Encounter (Signed)
Received request to refill Plavix and Lisinopril. He needs to have CMP that was scheduled for Nov 2013 done and I have added a CBC. Please have him come in this month for these labs, then a follow up appointment within 1 month.

## 2012-12-26 ENCOUNTER — Other Ambulatory Visit: Payer: Self-pay | Admitting: Internal Medicine

## 2013-01-05 ENCOUNTER — Ambulatory Visit: Payer: Medicare Other | Admitting: Internal Medicine

## 2013-01-12 ENCOUNTER — Ambulatory Visit (INDEPENDENT_AMBULATORY_CARE_PROVIDER_SITE_OTHER): Payer: Medicare Other | Admitting: Internal Medicine

## 2013-01-12 VITALS — BP 171/89 | HR 63 | Temp 97.5°F | Ht 73.0 in | Wt 156.6 lb

## 2013-01-12 DIAGNOSIS — N189 Chronic kidney disease, unspecified: Secondary | ICD-10-CM

## 2013-01-12 DIAGNOSIS — I10 Essential (primary) hypertension: Secondary | ICD-10-CM

## 2013-01-12 DIAGNOSIS — R531 Weakness: Secondary | ICD-10-CM

## 2013-01-12 DIAGNOSIS — F17200 Nicotine dependence, unspecified, uncomplicated: Secondary | ICD-10-CM

## 2013-01-12 DIAGNOSIS — I739 Peripheral vascular disease, unspecified: Secondary | ICD-10-CM

## 2013-01-12 DIAGNOSIS — R5381 Other malaise: Secondary | ICD-10-CM

## 2013-01-12 DIAGNOSIS — D649 Anemia, unspecified: Secondary | ICD-10-CM

## 2013-01-12 DIAGNOSIS — Z720 Tobacco use: Secondary | ICD-10-CM

## 2013-01-12 DIAGNOSIS — Z79899 Other long term (current) drug therapy: Secondary | ICD-10-CM

## 2013-01-12 LAB — CBC WITH DIFFERENTIAL/PLATELET
Basophils Absolute: 0 10*3/uL (ref 0.0–0.1)
Basophils Relative: 0 % (ref 0–1)
MCHC: 33.7 g/dL (ref 30.0–36.0)
Neutro Abs: 4 10*3/uL (ref 1.7–7.7)
Neutrophils Relative %: 67 % (ref 43–77)
Platelets: 250 10*3/uL (ref 150–400)
RDW: 13.6 % (ref 11.5–15.5)

## 2013-01-12 LAB — LIPID PANEL
Cholesterol: 172 mg/dL (ref 0–200)
Total CHOL/HDL Ratio: 3.7 Ratio
VLDL: 20 mg/dL (ref 0–40)

## 2013-01-12 LAB — COMPLETE METABOLIC PANEL WITH GFR
AST: 15 U/L (ref 0–37)
Albumin: 4.6 g/dL (ref 3.5–5.2)
Alkaline Phosphatase: 80 U/L (ref 39–117)
BUN: 17 mg/dL (ref 6–23)
Potassium: 4.5 mEq/L (ref 3.5–5.3)
Sodium: 143 mEq/L (ref 135–145)
Total Protein: 8 g/dL (ref 6.0–8.3)

## 2013-01-12 LAB — VITAMIN B12: Vitamin B-12: 870 pg/mL (ref 211–911)

## 2013-01-12 MED ORDER — HYDROCHLOROTHIAZIDE 25 MG PO TABS: 25.0000 mg | ORAL_TABLET | Freq: Every day | ORAL | Status: DC

## 2013-01-12 NOTE — Patient Instructions (Addendum)
Blood work today. Return in one month. Have colonoscopy done. Start taking your water pill (hydrochlorothiazide).

## 2013-01-12 NOTE — Progress Notes (Signed)
  Subjective:    Patient ID: Daniel [name], male    DOB: 19-Dec-1945, 67 y.o.   MRN: [id number]  HPI Feels well. States he's not taking HCTZ or KCl because "the drug store ran out". Presents today with his sister.  He states he is able to get around with his cane.  Denies any falls.  Admits to 3 cigarettes a day for 10 years (5-7 cigs at the most).  Denies EtOH use, denies ever being a heavy drinker. He's lost 16 pounds since last visit in May, not purposeful. Denies CP, SOB, N/V/D/C. Denies abdominal pain. Denies cough.   Review of Systems Complete 12 point review of systems otherwise negative except for that stated in the HPI.    Objective:   Physical Exam Filed Vitals:   01/12/13 0931  BP: 171/89  Pulse: 63  Temp: 97.5 F (36.4 C)   GEN: AAOx3, NAD. HEENT: EOMI, very poor dentition, no adenopathy, EOMI. CV: S1S2, 2/6 SEM RUSB, no r/g. No JVD. PULM: CTA bilat but decreased breath sounds. GI/ABD: Soft, protuberant abdomen, no HSM, no definite, palpable masses, +BS. LE/UE: 2/4 pulses, poorly kempt toenails/ no lesions. NEURO: CN II-XII intact. 3/5 strength of RLE. 2/5 strength dorsiflexion L foot.      Assessment & Plan:  3 yr. Old AAM w/ pmhx significant for HTN, CVA w/ right hemiparesis, likely CKD 3, HL, presents for follow up. 1) HTN: Poorly controlled.  Advised him to begin taking his HCTZ, Rx sent to pharmacy. Nonadherence. 2) CKD 3, likely secondary to hypertensive nephropathy: Check BMP. Check urine microalb/creat ratio. 3) CVA w/ R hemiparesis: Continue plavix. Needs better BP control. 4) HL: Check Lipid panel. No currently on statin. 5) Normocytic anemia: Refer for colonoscopy. Check CBC. 6) Tobacco abuse/hx alcohol abuse: Educated on quitting. States he quit EtOH a year ago. 7) Poor dentition: Will ask for referral for dentistry, he may need some extractions. I do not see evidence of infection at this time. 8) Health Maintenance: States had flu vaccine. Pneumovax UTD.  Needs Colonoscopy. I will see him again next month to review care plan once again.

## 2013-01-13 LAB — MICROALBUMIN / CREATININE URINE RATIO: Creatinine, Urine: 79.4 mg/dL

## 2013-01-17 ENCOUNTER — Encounter: Payer: Self-pay | Admitting: Internal Medicine

## 2013-01-21 ENCOUNTER — Other Ambulatory Visit: Payer: Self-pay | Admitting: Internal Medicine

## 2013-02-23 ENCOUNTER — Ambulatory Visit: Payer: Medicare Other | Admitting: Internal Medicine

## 2013-02-24 ENCOUNTER — Telehealth: Payer: Self-pay | Admitting: *Deleted

## 2013-02-24 NOTE — Telephone Encounter (Signed)
Call to [facility] to check on pt's refills after talking with caregiver on yesterday who said that pt did not have refills on his B/P med.  Someone picked up patients B/P meds on 02/22/2013.  Erven Colla, RN 02/24/2013 5:39 PM.

## 2013-02-28 ENCOUNTER — Encounter: Payer: Self-pay | Admitting: *Deleted

## 2013-03-02 ENCOUNTER — Encounter: Payer: Medicare Other | Admitting: Internal Medicine

## 2013-03-06 ENCOUNTER — Encounter: Payer: Medicare Other | Admitting: Internal Medicine

## 2013-03-13 NOTE — Addendum Note (Signed)
Addended by: Truddie Crumble on: 03/13/2013 03:24 PM   Modules accepted: Orders

## 2013-03-13 NOTE — Addendum Note (Signed)
Addended by: Hulan Fray on: 03/13/2013 08:32 PM   Modules accepted: Orders

## 2013-03-14 NOTE — Addendum Note (Signed)
Addended by: Truddie Crumble on: 03/14/2013 12:33 PM   Modules accepted: Orders

## 2013-04-17 ENCOUNTER — Other Ambulatory Visit: Payer: Self-pay | Admitting: *Deleted

## 2013-04-17 MED ORDER — GABAPENTIN 100 MG PO CAPS: 100.0000 mg | ORAL_CAPSULE | Freq: Three times a day (TID) | ORAL | Status: DC

## 2013-04-20 ENCOUNTER — Encounter: Payer: Self-pay | Admitting: Internal Medicine

## 2013-04-20 ENCOUNTER — Ambulatory Visit (INDEPENDENT_AMBULATORY_CARE_PROVIDER_SITE_OTHER): Payer: Medicare Other | Admitting: Internal Medicine

## 2013-04-20 VITALS — BP 159/79 | HR 82 | Temp 97.8°F | Ht 68.0 in | Wt 165.5 lb

## 2013-04-20 DIAGNOSIS — E785 Hyperlipidemia, unspecified: Secondary | ICD-10-CM

## 2013-04-20 DIAGNOSIS — K089 Disorder of teeth and supporting structures, unspecified: Secondary | ICD-10-CM

## 2013-04-20 DIAGNOSIS — R079 Chest pain, unspecified: Secondary | ICD-10-CM

## 2013-04-20 MED ORDER — ATORVASTATIN CALCIUM 40 MG PO TABS: 40.0000 mg | ORAL_TABLET | Freq: Every day | ORAL | Status: DC

## 2013-04-20 MED ORDER — LISINOPRIL 20 MG PO TABS: 20.0000 mg | ORAL_TABLET | Freq: Every day | ORAL | Status: DC

## 2013-04-20 MED ORDER — CLOPIDOGREL BISULFATE 75 MG PO TABS: 75.0000 mg | ORAL_TABLET | Freq: Every day | ORAL | Status: DC

## 2013-04-20 NOTE — Progress Notes (Signed)
  Subjective:    Patient ID: Daniel [name], male    DOB: May 31, 1946, 67 y.o.   MRN: [id number]  HPI States he feels well.  States he's had episodes of CP while in bed, last 3-4 minutes.  States no associated symptoms. No SOB.  The pain feels like a "heavy load".  Denies CP with exertion. Denies any current CP. States the pain occurs randomly. Denies any radiation of the pain. States he has not been taking Plavix because he ran out of it. Admits to 1-2 cigarettes a day.  Denies alcohol use. He brings his medications but he is not taking Lisinopril or Plavix.  Review of Systems Complete 12 point review of systems otherwise negative except for that stated in the HPI.    Objective:   Physical Exam Filed Vitals:   04/20/13 1022  BP: 159/79  Pulse: 82  Temp: 97.8 F (36.6 C)   GEN: AAOx3, NAD.  HEENT: Poor dentition. EOMI, PERRL, no icterus, no adenopathy. CV: S1S2, no m/r/g, RRR PULM: CTA bilat ABD/GI: Soft, NT, +BS, no guarding, no distention. LE/UE: 2/4 pulses, no c/c/e. NEURO: CN II-XII intact.     Assessment & Plan:  48 yr. Old AAM w/ pmhx significant for HTN, CVA w/ right hemiparesis, likely CKD 3, HL, presents for follow up.  1) HTN: He is not taking lisinopril, states the pharmacy did not give this to him. I will resend this. This explains his BP not at goal. 2) CP: Does not occur with exertion. Due to risk factors, I will send him to cardiology for stress testing. 2) CKD 3, likely secondary to hypertensive nephropathy: +microalbuminuria.  Lisinopril re-sent to pharmacy, educated on taking his meds. 3) CVA w/ R hemiparesis: Plavix resent to pharmacy, educated on adherence. 4) HL: 10 year ASCVD risk is 38%.  I will start atorvastatin 40 mg at this time.  5) Normocytic anemia: He did not start colonoscopy process. I will hold off due to CP complaints. 6) Tobacco abuse/hx alcohol abuse: Educated on quitting. States he quit EtOH a year ago.  7) Poor dentition: Will ask for referral  for dentistry, he may need some extractions. I do not see evidence of infection at this time.  8) Health Maintenance: States had flu vaccine. Pneumovax UTD. Needs Colonoscopy, will hold off while he is ruled out for cardiac causes. Return in 3 months.

## 2013-04-20 NOTE — Patient Instructions (Addendum)
Make sure you are taking Lisinopril and plavix as on your medication list. I have added a medication for cholesterol, atorvastatin (Lipitor) for you to take daily. Return in 3 months. I have referred you to cardiology to evaluate your chest pain. If it reoccurs, call 911 and go to the emergency room. I have referred you to a dentist to perhaps extract some teeth.

## 2013-04-21 DIAGNOSIS — R079 Chest pain, unspecified: Secondary | ICD-10-CM | POA: Insufficient documentation

## 2013-05-31 ENCOUNTER — Ambulatory Visit: Payer: Medicare Other | Admitting: Cardiovascular Disease

## 2013-06-21 ENCOUNTER — Other Ambulatory Visit: Payer: Self-pay | Admitting: *Deleted

## 2013-06-21 MED ORDER — METOPROLOL TARTRATE 100 MG PO TABS: 100.0000 mg | ORAL_TABLET | Freq: Two times a day (BID) | ORAL | Status: DC

## 2013-06-29 ENCOUNTER — Other Ambulatory Visit: Payer: Self-pay | Admitting: *Deleted

## 2013-06-29 DIAGNOSIS — I10 Essential (primary) hypertension: Secondary | ICD-10-CM

## 2013-06-29 MED ORDER — HYDROCHLOROTHIAZIDE 25 MG PO TABS: 25.0000 mg | ORAL_TABLET | Freq: Every day | ORAL | Status: DC

## 2013-07-24 ENCOUNTER — Other Ambulatory Visit: Payer: Self-pay | Admitting: Internal Medicine

## 2013-08-21 ENCOUNTER — Other Ambulatory Visit: Payer: Self-pay | Admitting: Internal Medicine

## 2013-09-27 ENCOUNTER — Other Ambulatory Visit: Payer: Self-pay | Admitting: Internal Medicine

## 2013-09-29 ENCOUNTER — Other Ambulatory Visit: Payer: Self-pay | Admitting: Internal Medicine

## 2013-11-25 ENCOUNTER — Other Ambulatory Visit: Payer: Self-pay | Admitting: Internal Medicine

## 2014-01-18 ENCOUNTER — Ambulatory Visit (INDEPENDENT_AMBULATORY_CARE_PROVIDER_SITE_OTHER): Payer: Medicare Other | Admitting: Internal Medicine

## 2014-01-18 ENCOUNTER — Encounter: Payer: Self-pay | Admitting: Internal Medicine

## 2014-01-18 VITALS — BP 129/76 | HR 57 | Temp 97.5°F | Ht 68.0 in | Wt 162.9 lb

## 2014-01-18 DIAGNOSIS — N183 Chronic kidney disease, stage 3 unspecified: Secondary | ICD-10-CM | POA: Insufficient documentation

## 2014-01-18 DIAGNOSIS — E785 Hyperlipidemia, unspecified: Secondary | ICD-10-CM

## 2014-01-18 DIAGNOSIS — I10 Essential (primary) hypertension: Secondary | ICD-10-CM

## 2014-01-18 DIAGNOSIS — K089 Disorder of teeth and supporting structures, unspecified: Secondary | ICD-10-CM

## 2014-01-18 DIAGNOSIS — I69959 Hemiplegia and hemiparesis following unspecified cerebrovascular disease affecting unspecified side: Secondary | ICD-10-CM

## 2014-01-18 DIAGNOSIS — N189 Chronic kidney disease, unspecified: Secondary | ICD-10-CM

## 2014-01-18 DIAGNOSIS — Z23 Encounter for immunization: Secondary | ICD-10-CM

## 2014-01-18 DIAGNOSIS — R7302 Impaired glucose tolerance (oral): Secondary | ICD-10-CM

## 2014-01-18 DIAGNOSIS — I129 Hypertensive chronic kidney disease with stage 1 through stage 4 chronic kidney disease, or unspecified chronic kidney disease: Secondary | ICD-10-CM

## 2014-01-18 DIAGNOSIS — D649 Anemia, unspecified: Secondary | ICD-10-CM

## 2014-01-18 DIAGNOSIS — I739 Peripheral vascular disease, unspecified: Secondary | ICD-10-CM

## 2014-01-18 LAB — HEMOGLOBIN A1C
HEMOGLOBIN A1C: 5.9 % — AB (ref <5.7)
Mean Plasma Glucose: 123 mg/dL — ABNORMAL HIGH (ref <117)

## 2014-01-18 LAB — COMPLETE METABOLIC PANEL WITH GFR
ALBUMIN: 4.4 g/dL (ref 3.5–5.2)
ALT: 17 U/L (ref 0–53)
AST: 16 U/L (ref 0–37)
Alkaline Phosphatase: 94 U/L (ref 39–117)
BILIRUBIN TOTAL: 0.3 mg/dL (ref 0.2–1.2)
BUN: 28 mg/dL — ABNORMAL HIGH (ref 6–23)
CALCIUM: 9.2 mg/dL (ref 8.4–10.5)
CHLORIDE: 105 meq/L (ref 96–112)
CO2: 29 mEq/L (ref 19–32)
Creat: 2.1 mg/dL — ABNORMAL HIGH (ref 0.50–1.35)
GFR, EST AFRICAN AMERICAN: 36 mL/min — AB
GFR, EST NON AFRICAN AMERICAN: 31 mL/min — AB
GLUCOSE: 91 mg/dL (ref 70–99)
POTASSIUM: 3.6 meq/L (ref 3.5–5.3)
Sodium: 141 mEq/L (ref 135–145)
TOTAL PROTEIN: 7.4 g/dL (ref 6.0–8.3)

## 2014-01-18 LAB — CBC WITH DIFFERENTIAL/PLATELET
Basophils Absolute: 0 10*3/uL (ref 0.0–0.1)
Basophils Relative: 0 % (ref 0–1)
Eosinophils Absolute: 0.3 10*3/uL (ref 0.0–0.7)
Eosinophils Relative: 6 % — ABNORMAL HIGH (ref 0–5)
HCT: 35.4 % — ABNORMAL LOW (ref 39.0–52.0)
Hemoglobin: 12.2 g/dL — ABNORMAL LOW (ref 13.0–17.0)
LYMPHS PCT: 23 % (ref 12–46)
Lymphs Abs: 1.2 10*3/uL (ref 0.7–4.0)
MCH: 30.1 pg (ref 26.0–34.0)
MCHC: 34.5 g/dL (ref 30.0–36.0)
MCV: 87.4 fL (ref 78.0–100.0)
MONOS PCT: 10 % (ref 3–12)
Monocytes Absolute: 0.5 10*3/uL (ref 0.1–1.0)
NEUTROS ABS: 3.2 10*3/uL (ref 1.7–7.7)
NEUTROS PCT: 61 % (ref 43–77)
PLATELETS: 213 10*3/uL (ref 150–400)
RBC: 4.05 MIL/uL — AB (ref 4.22–5.81)
RDW: 13.4 % (ref 11.5–15.5)
WBC: 5.3 10*3/uL (ref 4.0–10.5)

## 2014-01-18 LAB — LIPID PANEL
CHOL/HDL RATIO: 2.4 ratio
Cholesterol: 99 mg/dL (ref 0–200)
HDL: 42 mg/dL (ref >39)
LDL Cholesterol: 45 mg/dL (ref 0–99)
Triglycerides: 62 mg/dL (ref <150)
VLDL: 12 mg/dL (ref 0–40)

## 2014-01-18 MED ORDER — ZOSTER VACCINE LIVE 19400 UNT/0.65ML ~~LOC~~ SOLR: 0.6500 mL | Freq: Once | SUBCUTANEOUS | Status: DC

## 2014-01-18 MED ORDER — LISINOPRIL 20 MG PO TABS: 20.0000 mg | ORAL_TABLET | Freq: Every day | ORAL | Status: DC

## 2014-01-18 MED ORDER — METOPROLOL TARTRATE 100 MG PO TABS: ORAL_TABLET | ORAL | Status: DC

## 2014-01-18 MED ORDER — ATORVASTATIN CALCIUM 40 MG PO TABS: ORAL_TABLET | ORAL | Status: DC

## 2014-01-18 MED ORDER — HYDROCHLOROTHIAZIDE 25 MG PO TABS: 25.0000 mg | ORAL_TABLET | Freq: Every day | ORAL | Status: DC

## 2014-01-18 MED ORDER — CLOPIDOGREL BISULFATE 75 MG PO TABS: ORAL_TABLET | ORAL | Status: DC

## 2014-01-18 MED ORDER — GABAPENTIN 100 MG PO CAPS: ORAL_CAPSULE | ORAL | Status: DC

## 2014-01-18 NOTE — Patient Instructions (Addendum)
-  Referred you to get a colonoscopy. -Referred you to dentistry. -Blood work today, urine will be tested as well for protein. -Continue current medications. -Flu vaccine today. -Shingles vaccine sent to your pharmacy.

## 2014-01-18 NOTE — Progress Notes (Signed)
   Subjective:    Patient ID: Daniel [name], male    DOB: 11-Jan-1946, 68 y.o.   MRN: [id number]  HPI States everything is all right. He states he's taking his medications. Denies any HA's, SOB, CP. States he's taking his medications.    Review of Systems  Constitutional: Negative for fever, chills and appetite change.  HENT: Negative for trouble swallowing.   Respiratory: Negative for chest tightness and shortness of breath.   Cardiovascular: Negative for chest pain, palpitations and leg swelling.  Gastrointestinal: Negative for nausea, vomiting, abdominal pain and blood in stool.  Genitourinary: Negative for dysuria, urgency and decreased urine volume.  Neurological: Negative for dizziness and syncope.  Psychiatric/Behavioral: Negative for confusion.       Objective:   Physical Exam  Constitutional: He is oriented to person, place, and time. He appears well-developed and well-nourished.  HENT:  Head: Normocephalic and atraumatic.  Poor dentition  Eyes: Conjunctivae and EOM are normal. Pupils are equal, round, and reactive to light. Right eye exhibits no discharge. Left eye exhibits no discharge.  Neck: Normal range of motion. Neck supple. No JVD present. No tracheal deviation present. No thyromegaly present.  Cardiovascular: Normal rate, regular rhythm and normal heart sounds.  Exam reveals no friction rub.   No murmur heard. Pulmonary/Chest: Effort normal and breath sounds normal. He has no wheezes. He has no rales.  Abdominal: Soft. Bowel sounds are normal. He exhibits no distension and no mass. There is no tenderness. There is no guarding.  Lymphadenopathy:    He has no cervical adenopathy.  Neurological: He is alert and oriented to person, place, and time.  3/5 strength in RLE and 4/5 strength RUE. 5/5 strength LUE and LLE.  Skin: Skin is warm.  Psychiatric: He has a normal mood and affect. His behavior is normal.   Filed Vitals:   01/18/14 1047  BP: 146/77  Pulse: 69    Temp: 97.5 F (36.4 C)       Assessment & Plan:  16 yr. Old AAM w/ pmhx significant for HTN, CVA w/ right hemiparesis, likely CKD 3, HL, presents for follow up.   1) HTN: States he's been taking medications, BP on repeat is 129/76 mmHg. He has evidence of proteinuric renal disease. No changes at this time. He denies any more CP, need stress test results. He saw Dr. Irish Lack of [place].  2) CKD 3, likely secondary to hypertensive nephropathy: +microalbuminuria, noted on 01/2013 (89.8 mg/g). Repeat today. BP below target.  3) CVA w/ R hemiparesis: On plavix. Continue therapy.  4) HL: 10 year ASCVD risk is 38%. He has had a CVA. Continue current tx with statin.   5) Normocytic anemia: repeat CBC today, obtain iron panel. Refer for colonoscopy.   6) Tobacco abuse/hx alcohol abuse: States he still uses a few cigarettes a day, educating on quitting. Denies any more EtOH use.  7) Poor dentition: He did not go to dentistry, will refer again.   8) Health Maintenance: Flu vaccine. Pneumovax UTD. Will need colonoscopy, referred. Shingles vaccine sent to pharmarcy. Return in 3 months.

## 2014-01-19 LAB — MICROALBUMIN / CREATININE URINE RATIO
Creatinine, Urine: 102.5 mg/dL
MICROALB/CREAT RATIO: 26.1 mg/g (ref 0.0–30.0)
Microalb, Ur: 2.68 mg/dL — ABNORMAL HIGH (ref 0.00–1.89)

## 2014-01-24 ENCOUNTER — Other Ambulatory Visit: Payer: Self-pay | Admitting: Internal Medicine

## 2014-03-24 ENCOUNTER — Other Ambulatory Visit: Payer: Self-pay | Admitting: Internal Medicine

## 2014-05-28 ENCOUNTER — Other Ambulatory Visit: Payer: Self-pay | Admitting: Internal Medicine

## 2014-05-28 NOTE — Telephone Encounter (Signed)
Former pt of DrPaya, last office visit 01/18/2014, no upcoming appts scheduled.  Will forward refill request to new pcp.Daniel [name], [name]6/22/20159:09 AM

## 2014-06-23 ENCOUNTER — Other Ambulatory Visit: Payer: Self-pay | Admitting: Internal Medicine

## 2014-08-25 ENCOUNTER — Other Ambulatory Visit: Payer: Self-pay | Admitting: Internal Medicine

## 2014-09-24 ENCOUNTER — Other Ambulatory Visit: Payer: Self-pay | Admitting: Internal Medicine

## 2014-10-24 ENCOUNTER — Other Ambulatory Visit: Payer: Self-pay | Admitting: Internal Medicine

## 2014-10-24 NOTE — Telephone Encounter (Signed)
Sent message to front desk pool for appt as outlined.

## 2014-11-22 ENCOUNTER — Other Ambulatory Visit: Payer: Self-pay | Admitting: Internal Medicine

## 2014-12-22 ENCOUNTER — Other Ambulatory Visit: Payer: Self-pay | Admitting: Internal Medicine

## 2014-12-23 ENCOUNTER — Other Ambulatory Visit: Payer: Self-pay | Admitting: Internal Medicine

## 2015-01-17 ENCOUNTER — Ambulatory Visit (INDEPENDENT_AMBULATORY_CARE_PROVIDER_SITE_OTHER): Payer: Medicare Other | Admitting: Internal Medicine

## 2015-01-17 ENCOUNTER — Encounter: Payer: Self-pay | Admitting: Internal Medicine

## 2015-01-17 DIAGNOSIS — N183 Chronic kidney disease, stage 3 unspecified: Secondary | ICD-10-CM

## 2015-01-17 DIAGNOSIS — D649 Anemia, unspecified: Secondary | ICD-10-CM

## 2015-01-17 DIAGNOSIS — K089 Disorder of teeth and supporting structures, unspecified: Secondary | ICD-10-CM | POA: Insufficient documentation

## 2015-01-17 DIAGNOSIS — Z23 Encounter for immunization: Secondary | ICD-10-CM

## 2015-01-17 DIAGNOSIS — K088 Other specified disorders of teeth and supporting structures: Secondary | ICD-10-CM

## 2015-01-17 DIAGNOSIS — I129 Hypertensive chronic kidney disease with stage 1 through stage 4 chronic kidney disease, or unspecified chronic kidney disease: Secondary | ICD-10-CM | POA: Diagnosis not present

## 2015-01-17 DIAGNOSIS — Z0000 Encounter for general adult medical examination without abnormal findings: Secondary | ICD-10-CM

## 2015-01-17 LAB — CBC WITH DIFFERENTIAL/PLATELET
BASOS ABS: 0 10*3/uL (ref 0.0–0.1)
Basophils Relative: 1 % (ref 0–1)
EOS ABS: 0.2 10*3/uL (ref 0.0–0.7)
EOS PCT: 6 % — AB (ref 0–5)
HCT: 34.4 % — ABNORMAL LOW (ref 39.0–52.0)
Hemoglobin: 11.6 g/dL — ABNORMAL LOW (ref 13.0–17.0)
Lymphocytes Relative: 28 % (ref 12–46)
Lymphs Abs: 1.1 10*3/uL (ref 0.7–4.0)
MCH: 29.7 pg (ref 26.0–34.0)
MCHC: 33.7 g/dL (ref 30.0–36.0)
MCV: 88.2 fL (ref 78.0–100.0)
MONO ABS: 0.4 10*3/uL (ref 0.1–1.0)
MPV: 11.2 fL (ref 8.6–12.4)
Monocytes Relative: 10 % (ref 3–12)
Neutro Abs: 2.2 10*3/uL (ref 1.7–7.7)
Neutrophils Relative %: 55 % (ref 43–77)
PLATELETS: 188 10*3/uL (ref 150–400)
RBC: 3.9 MIL/uL — AB (ref 4.22–5.81)
RDW: 13.5 % (ref 11.5–15.5)
WBC: 4 10*3/uL (ref 4.0–10.5)

## 2015-01-17 LAB — BASIC METABOLIC PANEL WITH GFR
BUN: 35 mg/dL — ABNORMAL HIGH (ref 6–23)
CALCIUM: 9.1 mg/dL (ref 8.4–10.5)
CO2: 24 mEq/L (ref 19–32)
Chloride: 107 mEq/L (ref 96–112)
Creat: 2.35 mg/dL — ABNORMAL HIGH (ref 0.50–1.35)
GFR, EST AFRICAN AMERICAN: 31 mL/min — AB
GFR, Est Non African American: 27 mL/min — ABNORMAL LOW
GLUCOSE: 88 mg/dL (ref 70–99)
POTASSIUM: 3.9 meq/L (ref 3.5–5.3)
SODIUM: 142 meq/L (ref 135–145)

## 2015-01-17 LAB — IRON: Iron: 44 ug/dL (ref 42–165)

## 2015-01-17 NOTE — Patient Instructions (Addendum)
-   It was a pleasure seeing you today - I have referred you to a dentist for your dental care - I have also referred you to a gastroenterologist for a colonoscopy - Your BP is a little high today. I will recheck it on your follow up appointment - Please follow up in 3 months - I am also going to check some blood work today  Colonoscopy A colonoscopy is an exam to look at the entire large intestine (colon). This exam can help find problems such as tumors, polyps, inflammation, and areas of bleeding. The exam takes about 1 hour.  LET [facility] CARE PROVIDER KNOW ABOUT:   Any allergies you have.  All medicines you are taking, including vitamins, herbs, eye drops, creams, and over-the-counter medicines.  Previous problems you or members of your family have had with the use of anesthetics.  Any blood disorders you have.  Previous surgeries you have had.  Medical conditions you have. RISKS AND COMPLICATIONS  Generally, this is a safe procedure. However, as with any procedure, complications can occur. Possible complications include:  Bleeding.  Tearing or rupture of the colon wall.  Reaction to medicines given during the exam.  Infection (rare). BEFORE THE PROCEDURE   Ask your health care provider about changing or stopping your regular medicines.  You may be prescribed an oral bowel prep. This involves drinking a large amount of medicated liquid, starting the day before your procedure. The liquid will cause you to have multiple loose stools until your stool is almost clear or light green. This cleans out your colon in preparation for the procedure.  Do not eat or drink anything else once you have started the bowel prep, unless your health care provider tells you it is safe to do so.  Arrange for someone to drive you home after the procedure. PROCEDURE   You will be given medicine to help you relax (sedative).  You will lie on your side with your knees bent.  A long, flexible  tube with a light and camera on the end (colonoscope) will be inserted through the rectum and into the colon. The camera sends video back to a computer screen as it moves through the colon. The colonoscope also releases carbon dioxide gas to inflate the colon. This helps your health care provider see the area better.  During the exam, your health care provider may take a small tissue sample (biopsy) to be examined under a microscope if any abnormalities are found.  The exam is finished when the entire colon has been viewed. AFTER THE PROCEDURE   Do not drive for 24 hours after the exam.  You may have a small amount of blood in your stool.  You may pass moderate amounts of gas and have mild abdominal cramping or bloating. This is caused by the gas used to inflate your colon during the exam.  Ask when your test results will be ready and how you will get your results. Make sure you get your test results. Document Released: 11/20/2000 Document Revised: 09/13/2013 Document Reviewed: 07/31/2013 [facility] Patient Information 2015 [place], [place]. This information is not intended to replace advice given to you by your health care provider. Make sure you discuss any questions you have with your health care provider.

## 2015-01-17 NOTE — Assessment & Plan Note (Signed)
BP Readings from Last 3 Encounters:  01/17/15 152/81  01/18/14 129/76  04/20/13 159/79    Lab Results  Component Value Date   NA 141 01/18/2014   K 3.6 01/18/2014   CREATININE 2.10* 01/18/2014    Assessment: Blood pressure control:  fair Progress toward BP goal:   deteriorated Comments: pt is compliant with his medications. Will not change meds given BP only mildly elevated and has been controlled prior  Plan: Medications:  continue current medications Educational resources provided: brochure (denies) Self management tools provided:   Other plans: Will check BMET today. If worsening CKD he may need referral to nephrology soon and have his BP meds changed off HCTZ and lisinopril. Will follow up

## 2015-01-17 NOTE — Assessment & Plan Note (Signed)
-   Uncertain etiology. Possibly secondary to CKD -Will repeat CBC today. Pt will need a colonoscopy for screening - Will check iron and ferritin today

## 2015-01-17 NOTE — Assessment & Plan Note (Signed)
-   Patient to follow up with dentist - Importance of follow up explained to patient - Will get patient in touch with Bonna Gains to see if she can refer the patient to a dentist

## 2015-01-17 NOTE — Progress Notes (Signed)
   Subjective:    Patient ID: Daniel [name], male    DOB: [birth year], 69 y.o.   MRN: [id number]  HPI Pt here for routine follow up. He has no complaints at the current time  As per Dr. Martie Round last note he had referred Mr. Hogenson for a colonoscopy and to a dentist but patient states that he hasn't gone to any appointments  He is compliant with his meds and states that he does not leave the house much. He ambulates at home with a cane. He denies any recent falls.   Review of Systems  Constitutional: Negative.   HENT: Negative.   Respiratory: Negative.   Cardiovascular: Negative.   Gastrointestinal: Negative.   Musculoskeletal: Negative.   Skin: Negative.   Neurological: Negative.   Psychiatric/Behavioral: Negative.        Objective:   Physical Exam  Constitutional: He is oriented to person, place, and time. He appears well-developed and well-nourished.  HENT:  Head: Normocephalic and atraumatic.  Eyes: Conjunctivae are normal.  Neck: Normal range of motion.  Cardiovascular: Normal rate and regular rhythm.   Pulmonary/Chest: Effort normal and breath sounds normal. No respiratory distress. He has no wheezes. He has no rales.  Abdominal: Soft. Bowel sounds are normal. He exhibits no distension. There is no tenderness.  Musculoskeletal: Normal range of motion. He exhibits no edema or tenderness.  Neurological: He is alert and oriented to person, place, and time.  Skin: Skin is warm and dry.  Psychiatric: He has a normal mood and affect. His behavior is normal.          Assessment & Plan:   Please see problem based charting for assessment and plan:

## 2015-01-17 NOTE — Assessment & Plan Note (Signed)
-   pt referred for colonoscopy  - Prevnar 13 given today

## 2015-01-18 ENCOUNTER — Telehealth: Payer: Self-pay | Admitting: Internal Medicine

## 2015-01-18 ENCOUNTER — Other Ambulatory Visit: Payer: Self-pay | Admitting: Internal Medicine

## 2015-01-18 LAB — FERRITIN: Ferritin: 208 ng/mL (ref 22–322)

## 2015-01-18 MED ORDER — AMLODIPINE BESYLATE 5 MG PO TABS: 5.0000 mg | ORAL_TABLET | Freq: Every day | ORAL | Status: DC

## 2015-01-18 NOTE — Telephone Encounter (Signed)
I attempted to call Mr. [place] regarding his blood work which showed mildly worsening renal failure. I was unable to reach him but left a voicemail asking him to call me back. I would like him to stop taking his HCTZ and start amlodipine 5 mg and follow up in 1 week. Will attempt to call him again later

## 2015-01-18 NOTE — Telephone Encounter (Signed)
I was able to get in touch with the sister of the patient who informed me that Regino Schultze R.N had already spoken to her about the changes in medication, I spoke about the need to follow up this week in clinic and she will attempto set up transport and call the clinic for an appointment once she finds out about the availability of transport. Would recheck BMET on follow up this week and consider holding ACE-I as well if no improvement in creatinine.

## 2015-02-12 ENCOUNTER — Other Ambulatory Visit: Payer: Self-pay | Admitting: Internal Medicine

## 2015-02-21 ENCOUNTER — Other Ambulatory Visit: Payer: Self-pay | Admitting: Internal Medicine

## 2015-03-18 ENCOUNTER — Encounter: Payer: Self-pay | Admitting: Internal Medicine

## 2015-03-21 ENCOUNTER — Other Ambulatory Visit: Payer: Self-pay | Admitting: Internal Medicine

## 2015-03-22 ENCOUNTER — Other Ambulatory Visit: Payer: Self-pay | Admitting: Internal Medicine

## 2015-04-11 ENCOUNTER — Ambulatory Visit: Payer: Medicare Other | Admitting: Internal Medicine

## 2015-05-30 ENCOUNTER — Ambulatory Visit (INDEPENDENT_AMBULATORY_CARE_PROVIDER_SITE_OTHER): Payer: Medicare Other | Admitting: Internal Medicine

## 2015-05-30 ENCOUNTER — Encounter: Payer: Self-pay | Admitting: Internal Medicine

## 2015-05-30 VITALS — BP 169/75 | HR 59 | Temp 98.0°F | Ht 72.0 in | Wt 156.4 lb

## 2015-05-30 DIAGNOSIS — IMO0001 Reserved for inherently not codable concepts without codable children: Secondary | ICD-10-CM | POA: Insufficient documentation

## 2015-05-30 DIAGNOSIS — Z23 Encounter for immunization: Secondary | ICD-10-CM

## 2015-05-30 DIAGNOSIS — N183 Chronic kidney disease, stage 3 (moderate): Secondary | ICD-10-CM

## 2015-05-30 DIAGNOSIS — E785 Hyperlipidemia, unspecified: Secondary | ICD-10-CM

## 2015-05-30 DIAGNOSIS — Z0000 Encounter for general adult medical examination without abnormal findings: Secondary | ICD-10-CM

## 2015-05-30 DIAGNOSIS — I129 Hypertensive chronic kidney disease with stage 1 through stage 4 chronic kidney disease, or unspecified chronic kidney disease: Secondary | ICD-10-CM

## 2015-05-30 DIAGNOSIS — K089 Disorder of teeth and supporting structures, unspecified: Secondary | ICD-10-CM

## 2015-05-30 DIAGNOSIS — K088 Other specified disorders of teeth and supporting structures: Secondary | ICD-10-CM

## 2015-05-30 LAB — BASIC METABOLIC PANEL WITH GFR
BUN: 20 mg/dL (ref 6–23)
CALCIUM: 9 mg/dL (ref 8.4–10.5)
CHLORIDE: 108 meq/L (ref 96–112)
CO2: 26 mEq/L (ref 19–32)
CREATININE: 1.96 mg/dL — AB (ref 0.50–1.35)
GFR, EST AFRICAN AMERICAN: 39 mL/min — AB
GFR, Est Non African American: 34 mL/min — ABNORMAL LOW
GLUCOSE: 92 mg/dL (ref 70–99)
POTASSIUM: 3.5 meq/L (ref 3.5–5.3)
SODIUM: 144 meq/L (ref 135–145)

## 2015-05-30 LAB — LIPID PANEL
Cholesterol: 92 mg/dL (ref 0–200)
HDL: 46 mg/dL (ref >=40)
LDL CALC: 34 mg/dL (ref 0–99)
Total CHOL/HDL Ratio: 2 Ratio
Triglycerides: 58 mg/dL (ref <150)
VLDL: 12 mg/dL (ref 0–40)

## 2015-05-30 MED ORDER — ATORVASTATIN CALCIUM 40 MG PO TABS: 40.0000 mg | ORAL_TABLET | Freq: Every day | ORAL | Status: DC

## 2015-05-30 MED ORDER — METOPROLOL TARTRATE 100 MG PO TABS: 100.0000 mg | ORAL_TABLET | Freq: Two times a day (BID) | ORAL | Status: DC

## 2015-05-30 MED ORDER — AMLODIPINE BESYLATE 5 MG PO TABS: ORAL_TABLET | ORAL | Status: DC

## 2015-05-30 MED ORDER — FOLIC ACID 1 MG PO TABS: 1.0000 mg | ORAL_TABLET | Freq: Every day | ORAL | Status: DC

## 2015-05-30 MED ORDER — LISINOPRIL 20 MG PO TABS: 20.0000 mg | ORAL_TABLET | Freq: Every day | ORAL | Status: DC

## 2015-05-30 NOTE — Assessment & Plan Note (Signed)
-   patient has not followed up with a dentist - Oral exam reveals caries and tooth decay - He will need follow up with a dentist - referral placed and importance of follow up explained to patient and his niece in detail

## 2015-05-30 NOTE — Progress Notes (Signed)
   Subjective:    Patient ID: Daniel [name], male    DOB: 05-Apr-1946, 69 y.o.   MRN: [id number]  HPI Patient seen and examined. He presents today for routine follow up of his HTN and CKD. He has no complaints at current time except that he occasionally gets gas at night relieved with burping.   Review of Systems  Constitutional: Negative.   HENT: Positive for dental problem. Negative for congestion, drooling, ear pain, hearing loss, mouth sores, sneezing, sore throat and voice change.   Eyes: Negative.   Respiratory: Negative.   Cardiovascular: Negative.   Gastrointestinal: Negative.   Genitourinary:       Complains of pruritis occasionally in the groin area  Musculoskeletal: Negative.   Skin: Negative.   Neurological: Negative.   Psychiatric/Behavioral: Negative.        Objective:   Physical Exam  Constitutional: He is oriented to person, place, and time. He appears well-developed and well-nourished.  HENT:  Head: Normocephalic and atraumatic.  Eyes: Conjunctivae are normal.  Neck: Normal range of motion.  Cardiovascular: Normal rate and regular rhythm.   No murmur heard. Pulmonary/Chest: Effort normal and breath sounds normal. No respiratory distress. He has no wheezes.  Abdominal: Bowel sounds are normal. He exhibits no distension. There is no tenderness.  Genitourinary: Penis normal.  No rash noted in the groin area  Musculoskeletal: Normal range of motion. He exhibits no edema.  Neurological: He is alert and oriented to person, place, and time.  Skin: Skin is warm and dry. No rash noted.  Psychiatric: He has a normal mood and affect. His behavior is normal.          Assessment & Plan:  Please see problem based charting for assessment and plan:

## 2015-05-30 NOTE — Assessment & Plan Note (Signed)
-   c/w atorvastatin for now - Will check lipid profile today

## 2015-05-30 NOTE — Assessment & Plan Note (Signed)
-   Patient referred for colonoscopy again - Importance of getting a colonoscopy once again explained to patient in detail and to niece as well - He states he will attempt to get an appointment in September - Will give Tdap today

## 2015-05-30 NOTE — Assessment & Plan Note (Signed)
BP Readings from Last 3 Encounters:  05/30/15 169/75  01/17/15 152/81  01/18/14 129/76    Lab Results  Component Value Date   NA 142 01/17/2015   K 3.9 01/17/2015   CREATININE 2.35* 01/17/2015    Assessment: Blood pressure control:  fair Progress toward BP goal:   deteriorated Comments: patient is compliant with his meds  Plan: Medications:  continue current medications Educational resources provided: brochure Self management tools provided:   Other plans: Will recheck BP on follow up and consider increasing norvasc to 10 mg if still elevated. Will also check BMP today given CKD

## 2015-05-30 NOTE — Patient Instructions (Signed)
-   It was a pleasure seeing you today - Please follow up with the stomach doctor for your colonoscopy. I will ask our office to try and make an appointment for you and call you - Please follow up with a dentist - We will give you a tetanus shot today - We will check some blood work today - If you get gas at night please try Gas- X over the counter - If gas pains persist please let me know - I will send refills today  Colonoscopy A colonoscopy is an exam to look at the entire large intestine (colon). This exam can help find problems such as tumors, polyps, inflammation, and areas of bleeding. The exam takes about 1 hour.  LET [facility] CARE PROVIDER KNOW ABOUT:   Any allergies you have.  All medicines you are taking, including vitamins, herbs, eye drops, creams, and over-the-counter medicines.  Previous problems you or members of your family have had with the use of anesthetics.  Any blood disorders you have.  Previous surgeries you have had.  Medical conditions you have. RISKS AND COMPLICATIONS  Generally, this is a safe procedure. However, as with any procedure, complications can occur. Possible complications include:  Bleeding.  Tearing or rupture of the colon wall.  Reaction to medicines given during the exam.  Infection (rare). BEFORE THE PROCEDURE   Ask your health care provider about changing or stopping your regular medicines.  You may be prescribed an oral bowel prep. This involves drinking a large amount of medicated liquid, starting the day before your procedure. The liquid will cause you to have multiple loose stools until your stool is almost clear or light green. This cleans out your colon in preparation for the procedure.  Do not eat or drink anything else once you have started the bowel prep, unless your health care provider tells you it is safe to do so.  Arrange for someone to drive you home after the procedure. PROCEDURE   You will be given medicine to  help you relax (sedative).  You will lie on your side with your knees bent.  A long, flexible tube with a light and camera on the end (colonoscope) will be inserted through the rectum and into the colon. The camera sends video back to a computer screen as it moves through the colon. The colonoscope also releases carbon dioxide gas to inflate the colon. This helps your health care provider see the area better.  During the exam, your health care provider may take a small tissue sample (biopsy) to be examined under a microscope if any abnormalities are found.  The exam is finished when the entire colon has been viewed. AFTER THE PROCEDURE   Do not drive for 24 hours after the exam.  You may have a small amount of blood in your stool.  You may pass moderate amounts of gas and have mild abdominal cramping or bloating. This is caused by the gas used to inflate your colon during the exam.  Ask when your test results will be ready and how you will get your results. Make sure you get your test results. Document Released: 11/20/2000 Document Revised: 09/13/2013 Document Reviewed: 07/31/2013 [facility] Patient Information 2015 [place], [place]. This information is not intended to replace advice given to you by your health care provider. Make sure you discuss any questions you have with your health care provider.

## 2015-06-19 ENCOUNTER — Other Ambulatory Visit: Payer: Self-pay | Admitting: Internal Medicine

## 2015-07-20 ENCOUNTER — Other Ambulatory Visit: Payer: Self-pay | Admitting: Internal Medicine

## 2015-08-18 ENCOUNTER — Other Ambulatory Visit: Payer: Self-pay | Admitting: Internal Medicine

## 2015-08-27 ENCOUNTER — Encounter: Payer: Self-pay | Admitting: Internal Medicine

## 2015-08-27 ENCOUNTER — Ambulatory Visit: Payer: Medicare Other | Admitting: Internal Medicine

## 2015-09-06 ENCOUNTER — Other Ambulatory Visit: Payer: Self-pay | Admitting: Internal Medicine

## 2015-09-06 DIAGNOSIS — Z1211 Encounter for screening for malignant neoplasm of colon: Secondary | ICD-10-CM

## 2015-09-12 ENCOUNTER — Encounter: Payer: Self-pay | Admitting: *Deleted

## 2015-09-12 ENCOUNTER — Telehealth: Payer: Self-pay | Admitting: *Deleted

## 2015-09-12 NOTE — Telephone Encounter (Signed)
Attempts to call patient to ask about Colonoscopy appointment calls from [facility] to schedule an appointment.  Have been unable to contact patient to schedule.  Call to contact number no answer.  Letter sent to patient to call the Clinics to verify if he wants to go for the Colonoscopy and a phone number  to contact him.  Sander Nephew, RN 09/12/2015 3:29 PM

## 2015-09-16 ENCOUNTER — Other Ambulatory Visit: Payer: Self-pay | Admitting: Internal Medicine

## 2015-10-16 ENCOUNTER — Other Ambulatory Visit: Payer: Self-pay | Admitting: Internal Medicine

## 2015-12-16 ENCOUNTER — Other Ambulatory Visit: Payer: Self-pay | Admitting: Internal Medicine

## 2016-02-17 ENCOUNTER — Other Ambulatory Visit: Payer: Self-pay | Admitting: Internal Medicine

## 2016-03-15 ENCOUNTER — Other Ambulatory Visit: Payer: Self-pay | Admitting: Internal Medicine

## 2016-03-16 NOTE — Telephone Encounter (Signed)
Asked to refill both clopidogrel and folic acid.  These medications are not linked to any diagnoses and "view all notes" for CVA with right hemiparesis, peripheral vascular disease, anemia, and EtOH abuse do not make note of these medications in the respective problem list diagnoses.  When last seen by Mr. Lewy PCP, Dr. Dareen Piano, these medications were prescribed and therefore I believe indicated, likely for one or more of the above reasons.  Also of note, he has an anemia that was normocytic when last checked in 01/2015.  I refilled 1 month of each medication with no refills and will ask Dr. Dareen Piano to review indications upon his return.  Thanks.

## 2016-03-21 ENCOUNTER — Other Ambulatory Visit: Payer: Self-pay | Admitting: Student in an Organized Health Care Education/Training Program

## 2016-04-18 ENCOUNTER — Telehealth: Payer: Self-pay | Admitting: Internal Medicine

## 2016-04-20 NOTE — Telephone Encounter (Signed)
Last appointment 05/30/2015. Letter sent to schedule appointment 08/27/2015. Could you call and resend a letter for appointment? Thanks!

## 2016-04-23 ENCOUNTER — Encounter: Payer: Self-pay | Admitting: Internal Medicine

## 2016-04-23 NOTE — Telephone Encounter (Signed)
Patient was called this am to get appt scheduled, but no answer.  Left message asking to please call back to make an appt.  Also will send a letter.

## 2016-04-23 NOTE — Telephone Encounter (Signed)
Thank you Charsetta!

## 2016-05-18 ENCOUNTER — Other Ambulatory Visit: Payer: Self-pay | Admitting: Internal Medicine

## 2016-05-18 NOTE — Telephone Encounter (Signed)
Attempts to get patient scheduled have been unsuccessful.  Not seen since this time last year.

## 2016-06-17 ENCOUNTER — Other Ambulatory Visit: Payer: Self-pay | Admitting: Internal Medicine

## 2016-06-17 NOTE — Telephone Encounter (Signed)
Patient has not been seen since 05/30/2015. Letter sent out for appointment 04/23/2016. No future appointments

## 2016-06-17 NOTE — Telephone Encounter (Signed)
Could you try to contact him for appointment. Per Dr. Dareen Piano we will not be able to fill his meds. If he does not follow up. Appointment letter sent 04/23/2016. Thanks

## 2016-06-18 ENCOUNTER — Encounter: Payer: Self-pay | Admitting: Internal Medicine

## 2016-06-18 ENCOUNTER — Other Ambulatory Visit: Payer: Self-pay | Admitting: Internal Medicine

## 2016-06-19 ENCOUNTER — Encounter: Payer: Self-pay | Admitting: Internal Medicine

## 2016-07-21 ENCOUNTER — Ambulatory Visit: Payer: Medicare Other | Admitting: Internal Medicine

## 2016-09-01 ENCOUNTER — Encounter: Payer: Self-pay | Admitting: Internal Medicine

## 2016-09-01 ENCOUNTER — Ambulatory Visit (INDEPENDENT_AMBULATORY_CARE_PROVIDER_SITE_OTHER): Payer: Medicare Other | Admitting: Internal Medicine

## 2016-09-01 VITALS — BP 155/74 | HR 86 | Temp 97.9°F | Ht 69.0 in | Wt 143.5 lb

## 2016-09-01 DIAGNOSIS — Z23 Encounter for immunization: Secondary | ICD-10-CM

## 2016-09-01 DIAGNOSIS — K089 Disorder of teeth and supporting structures, unspecified: Secondary | ICD-10-CM

## 2016-09-01 DIAGNOSIS — I129 Hypertensive chronic kidney disease with stage 1 through stage 4 chronic kidney disease, or unspecified chronic kidney disease: Secondary | ICD-10-CM

## 2016-09-01 DIAGNOSIS — I69351 Hemiplegia and hemiparesis following cerebral infarction affecting right dominant side: Secondary | ICD-10-CM | POA: Diagnosis not present

## 2016-09-01 DIAGNOSIS — E785 Hyperlipidemia, unspecified: Secondary | ICD-10-CM

## 2016-09-01 DIAGNOSIS — K0889 Other specified disorders of teeth and supporting structures: Secondary | ICD-10-CM

## 2016-09-01 DIAGNOSIS — Z79899 Other long term (current) drug therapy: Secondary | ICD-10-CM

## 2016-09-01 DIAGNOSIS — N183 Chronic kidney disease, stage 3 (moderate): Secondary | ICD-10-CM | POA: Diagnosis not present

## 2016-09-01 DIAGNOSIS — Z0000 Encounter for general adult medical examination without abnormal findings: Secondary | ICD-10-CM

## 2016-09-01 DIAGNOSIS — I69959 Hemiplegia and hemiparesis following unspecified cerebrovascular disease affecting unspecified side: Secondary | ICD-10-CM

## 2016-09-01 DIAGNOSIS — F17210 Nicotine dependence, cigarettes, uncomplicated: Secondary | ICD-10-CM

## 2016-09-01 MED ORDER — CLOPIDOGREL BISULFATE 75 MG PO TABS: ORAL_TABLET | ORAL | 3 refills | Status: DC

## 2016-09-01 MED ORDER — METOPROLOL TARTRATE 100 MG PO TABS: 100.0000 mg | ORAL_TABLET | Freq: Two times a day (BID) | ORAL | 1 refills | Status: DC

## 2016-09-01 MED ORDER — LISINOPRIL 20 MG PO TABS: ORAL_TABLET | ORAL | 3 refills | Status: DC

## 2016-09-01 NOTE — Assessment & Plan Note (Signed)
-   Has not followed up with dentist - States he feels well as is and is not interested in follow up at this time - Will readdress on next visit

## 2016-09-01 NOTE — Assessment & Plan Note (Signed)
-   Recheck lipid panel today - Will c/w atorvastatin

## 2016-09-01 NOTE — Assessment & Plan Note (Signed)
-   Patient with mild right residual hemiparesis - Will c/w lipitor, plavix for now

## 2016-09-01 NOTE — Assessment & Plan Note (Signed)
-   Flu shot given today - Once again discussed the importance of getting a colonoscopy. Patient did not schedule an appointment like we had discussed - Will readdress on follow in 3 months

## 2016-09-01 NOTE — Assessment & Plan Note (Signed)
BP Readings from Last 3 Encounters:  09/01/16 (!) 155/74  05/30/15 (!) 169/75  01/17/15 (!) 152/81    Lab Results  Component Value Date   NA 144 05/30/2015   K 3.5 05/30/2015   CREATININE 1.96 (H) 05/30/2015    Assessment: Blood pressure control:  fair Progress toward BP goal:   improved Comments: Patient has not been taking norvasc or metoprolol. Compliant with lisinopril 20 mg  Plan: Medications:  will c/w lisinopril and restart his home dose of metoprolol. Will d/c norvasc for now Educational resources provided: brochure (denies ) Self management tools provided:   Other plans: Check BMP. Patient with a history of CKD with last creatinine stable but would consider referral to nephrology if renal function worsens

## 2016-09-01 NOTE — Progress Notes (Signed)
   Subjective:    Patient ID: Daniel [name], male    DOB: 26-Feb-1946, 70 y.o.   MRN: [id number]  HPI  Patient seen and examined. He is here for routine follow up of his HTN and CKD. Patient has no new complaints and feels well. States he is compliant with his meds but on medication review it was noted he was not on norvasc or metoprolol. States he remembers being on them but does not take them anymore. He also forgot to set up his colonoscopy appointment and did not follow up with a dentist as we had discussed on last visit.     Review of Systems  Constitutional: Negative.   HENT: Negative.   Respiratory: Negative.   Cardiovascular: Positive for leg swelling.  Gastrointestinal: Negative.   Musculoskeletal: Negative.   Neurological: Negative.   Psychiatric/Behavioral: Negative.        Objective:   Physical Exam  Constitutional: He is oriented to person, place, and time. He appears well-developed and well-nourished.  Cardiovascular: Normal rate, regular rhythm and normal heart sounds.   Pulmonary/Chest: Effort normal and breath sounds normal. He has no wheezes. He has no rales.  Abdominal: Soft. Bowel sounds are normal. He exhibits no distension. There is no tenderness.  Musculoskeletal: Normal range of motion. He exhibits edema.  Trace b/l LE edema  Neurological: He is alert and oriented to person, place, and time.  Skin: Skin is warm. No rash noted. No erythema.  Psychiatric: He has a normal mood and affect. His behavior is normal. Thought content normal.          Assessment & Plan:  Please see problem based charting for assessment and plan:

## 2016-09-01 NOTE — Patient Instructions (Addendum)
-   It was a pleasure seeing you today - We will check some blood work today to assess your kidney function - I have restarted you on metoprolol which is a blood pressure pill - Please continue to take all your medications regularly - We need to get you an appointment for a colonoscopy - We will give you a flu shot today - Please follow up in 3 months

## 2016-09-02 ENCOUNTER — Telehealth: Payer: Self-pay | Admitting: Internal Medicine

## 2016-09-02 LAB — LIPID PANEL
CHOL/HDL RATIO: 1.9 ratio (ref 0.0–5.0)
CHOLESTEROL TOTAL: 89 mg/dL — AB (ref 100–199)
HDL: 48 mg/dL (ref >39)
LDL CALC: 32 mg/dL (ref 0–99)
Triglycerides: 44 mg/dL (ref 0–149)
VLDL CHOLESTEROL CAL: 9 mg/dL (ref 5–40)

## 2016-09-02 LAB — BMP8+ANION GAP
Anion Gap: 16 mmol/L (ref 10.0–18.0)
BUN / CREAT RATIO: 10 (ref 10–24)
BUN: 20 mg/dL (ref 8–27)
CO2: 23 mmol/L (ref 18–29)
CREATININE: 2 mg/dL — AB (ref 0.76–1.27)
Calcium: 9.2 mg/dL (ref 8.6–10.2)
Chloride: 105 mmol/L (ref 96–106)
GFR calc non Af Amer: 33 mL/min/{1.73_m2} — ABNORMAL LOW (ref >59)
GFR, EST AFRICAN AMERICAN: 38 mL/min/{1.73_m2} — AB (ref >59)
GLUCOSE: 97 mg/dL (ref 65–99)
Potassium: 4 mmol/L (ref 3.5–5.2)
SODIUM: 144 mmol/L (ref 134–144)

## 2016-09-02 NOTE — Telephone Encounter (Signed)
Lab work discussed with patient's sister (his emergency contact) over the phone. Explained that his creatinine is stable and his BMP is otherwise unremarkable. Lipid profile is wnl as well. No further w/u for now. She expresses understanding and will convey message to Mr. Madlock

## 2016-09-19 ENCOUNTER — Other Ambulatory Visit: Payer: Self-pay | Admitting: Internal Medicine

## 2017-01-16 ENCOUNTER — Other Ambulatory Visit: Payer: Self-pay | Admitting: Internal Medicine

## 2017-01-18 NOTE — Telephone Encounter (Signed)
Needs PCP appt HTN F/U

## 2017-01-19 NOTE — Telephone Encounter (Signed)
Message sent to front office to schedule pt an appt. 

## 2017-01-23 ENCOUNTER — Other Ambulatory Visit: Payer: Self-pay | Admitting: Internal Medicine

## 2017-03-09 ENCOUNTER — Other Ambulatory Visit: Payer: Self-pay | Admitting: Internal Medicine

## 2017-04-08 ENCOUNTER — Other Ambulatory Visit: Payer: Self-pay | Admitting: Internal Medicine

## 2017-04-08 DIAGNOSIS — I69959 Hemiplegia and hemiparesis following unspecified cerebrovascular disease affecting unspecified side: Secondary | ICD-10-CM

## 2017-04-08 NOTE — Assessment & Plan Note (Signed)
-   Patient has been on neurontin since his stroke in 2009 - I will continue this for now and address if he continues to need this on his follow up

## 2017-04-19 ENCOUNTER — Telehealth: Payer: Self-pay | Admitting: Internal Medicine

## 2017-04-19 NOTE — Telephone Encounter (Signed)
Unable to LVM for his appt on 04/20/2017 with Dr. Dareen Piano as his VM is not set up.

## 2017-04-20 ENCOUNTER — Ambulatory Visit: Payer: Medicare Other | Admitting: Internal Medicine

## 2017-04-20 ENCOUNTER — Encounter: Payer: Self-pay | Admitting: Internal Medicine

## 2017-07-20 ENCOUNTER — Encounter: Payer: Self-pay | Admitting: Internal Medicine

## 2017-07-20 ENCOUNTER — Other Ambulatory Visit: Payer: Self-pay | Admitting: Internal Medicine

## 2017-07-20 NOTE — Telephone Encounter (Signed)
Message sent to front office to schedule pt an appt. 

## 2017-07-21 ENCOUNTER — Encounter: Payer: Self-pay | Admitting: Internal Medicine

## 2017-08-12 ENCOUNTER — Other Ambulatory Visit: Payer: Self-pay | Admitting: Internal Medicine

## 2017-08-24 ENCOUNTER — Ambulatory Visit: Payer: Medicare Other | Admitting: Internal Medicine

## 2017-09-19 ENCOUNTER — Other Ambulatory Visit: Payer: Self-pay | Admitting: Internal Medicine

## 2017-09-19 DIAGNOSIS — I69959 Hemiplegia and hemiparesis following unspecified cerebrovascular disease affecting unspecified side: Secondary | ICD-10-CM

## 2017-09-20 ENCOUNTER — Encounter: Payer: Self-pay | Admitting: Internal Medicine

## 2017-09-20 NOTE — Telephone Encounter (Signed)
Sent 30 day letter Not clear why on gaba after reviewing Problem list. Only 30 day supply

## 2017-10-22 ENCOUNTER — Ambulatory Visit: Payer: Medicare Other

## 2017-10-26 ENCOUNTER — Encounter: Payer: Self-pay | Admitting: Internal Medicine

## 2017-11-12 DIAGNOSIS — E785 Hyperlipidemia, unspecified: Secondary | ICD-10-CM | POA: Diagnosis not present

## 2017-11-12 DIAGNOSIS — R2681 Unsteadiness on feet: Secondary | ICD-10-CM | POA: Diagnosis not present

## 2017-11-12 DIAGNOSIS — I10 Essential (primary) hypertension: Secondary | ICD-10-CM | POA: Diagnosis not present

## 2017-11-12 DIAGNOSIS — G629 Polyneuropathy, unspecified: Secondary | ICD-10-CM | POA: Diagnosis not present

## 2017-11-12 DIAGNOSIS — Z8673 Personal history of transient ischemic attack (TIA), and cerebral infarction without residual deficits: Secondary | ICD-10-CM | POA: Diagnosis not present

## 2017-11-12 DIAGNOSIS — Z7902 Long term (current) use of antithrombotics/antiplatelets: Secondary | ICD-10-CM | POA: Diagnosis not present

## 2017-11-16 ENCOUNTER — Other Ambulatory Visit: Payer: Self-pay | Admitting: Internal Medicine

## 2017-11-19 ENCOUNTER — Other Ambulatory Visit: Payer: Self-pay | Admitting: Internal Medicine

## 2017-11-19 ENCOUNTER — Other Ambulatory Visit: Payer: Self-pay | Admitting: Pharmacist

## 2017-11-19 NOTE — Patient Outreach (Signed)
Incoming call from [name] and his sister in response to the EMMI Medication Adherence Campaign. Speak with patient. HIPAA identifiers verified and verbal consent received. Mr. Haze gives permission for me to speak with his sister regarding his medications.  Patient's sister reports that the patient takes his lisinopril daily as directed. Denies any missed doses or barriers to taking his medication. Note that per Epic this prescription was just refilled today. Sister reports that they will pick this up from the pharmacy.  Patient's sister denies any further medication questions or concerns on behalf of the patient today.  Harlow Asa, PharmD, [facility] [phone]

## 2018-01-26 ENCOUNTER — Other Ambulatory Visit: Payer: Self-pay | Admitting: Internal Medicine

## 2018-02-18 ENCOUNTER — Other Ambulatory Visit: Payer: Self-pay | Admitting: Internal Medicine

## 2018-03-29 ENCOUNTER — Encounter: Payer: Self-pay | Admitting: Family Medicine

## 2018-03-29 ENCOUNTER — Ambulatory Visit: Payer: Medicare PPO | Admitting: Family Medicine

## 2018-03-29 VITALS — BP 170/80 | HR 100 | Temp 98.2°F | Ht 69.0 in | Wt 143.0 lb

## 2018-03-29 DIAGNOSIS — I129 Hypertensive chronic kidney disease with stage 1 through stage 4 chronic kidney disease, or unspecified chronic kidney disease: Secondary | ICD-10-CM | POA: Diagnosis not present

## 2018-03-29 DIAGNOSIS — N183 Chronic kidney disease, stage 3 unspecified: Secondary | ICD-10-CM

## 2018-03-29 DIAGNOSIS — R350 Frequency of micturition: Secondary | ICD-10-CM

## 2018-03-29 DIAGNOSIS — I739 Peripheral vascular disease, unspecified: Secondary | ICD-10-CM | POA: Diagnosis not present

## 2018-03-29 DIAGNOSIS — F17200 Nicotine dependence, unspecified, uncomplicated: Secondary | ICD-10-CM | POA: Diagnosis not present

## 2018-03-29 DIAGNOSIS — I69959 Hemiplegia and hemiparesis following unspecified cerebrovascular disease affecting unspecified side: Secondary | ICD-10-CM

## 2018-03-29 DIAGNOSIS — F209 Schizophrenia, unspecified: Secondary | ICD-10-CM

## 2018-03-29 DIAGNOSIS — E785 Hyperlipidemia, unspecified: Secondary | ICD-10-CM

## 2018-03-29 DIAGNOSIS — G629 Polyneuropathy, unspecified: Secondary | ICD-10-CM | POA: Diagnosis not present

## 2018-03-29 DIAGNOSIS — F17210 Nicotine dependence, cigarettes, uncomplicated: Secondary | ICD-10-CM | POA: Diagnosis not present

## 2018-03-29 DIAGNOSIS — F1911 Other psychoactive substance abuse, in remission: Secondary | ICD-10-CM | POA: Diagnosis not present

## 2018-03-29 DIAGNOSIS — R739 Hyperglycemia, unspecified: Secondary | ICD-10-CM | POA: Diagnosis not present

## 2018-03-29 LAB — TSH: TSH: 0.64 u[IU]/mL (ref 0.35–4.50)

## 2018-03-29 LAB — COMPREHENSIVE METABOLIC PANEL
ALT: 11 U/L (ref 0–53)
AST: 16 U/L (ref 0–37)
Albumin: 4.5 g/dL (ref 3.5–5.2)
Alkaline Phosphatase: 82 U/L (ref 39–117)
BUN: 21 mg/dL (ref 6–23)
CALCIUM: 9.6 mg/dL (ref 8.4–10.5)
CHLORIDE: 103 meq/L (ref 96–112)
CO2: 29 meq/L (ref 19–32)
CREATININE: 1.93 mg/dL — AB (ref 0.40–1.50)
GFR: 44.18 mL/min — ABNORMAL LOW (ref >60.00)
GLUCOSE: 79 mg/dL (ref 70–99)
Potassium: 3.7 mEq/L (ref 3.5–5.1)
Sodium: 140 mEq/L (ref 135–145)
Total Bilirubin: 0.4 mg/dL (ref 0.2–1.2)
Total Protein: 7.8 g/dL (ref 6.0–8.3)

## 2018-03-29 LAB — CBC
HCT: 37.7 % — ABNORMAL LOW (ref 39.0–52.0)
Hemoglobin: 12.5 g/dL — ABNORMAL LOW (ref 13.0–17.0)
MCHC: 33.2 g/dL (ref 30.0–36.0)
MCV: 91 fl (ref 78.0–100.0)
PLATELETS: 203 10*3/uL (ref 150.0–400.0)
RBC: 4.15 Mil/uL — ABNORMAL LOW (ref 4.22–5.81)
RDW: 13.4 % (ref 11.5–15.5)
WBC: 5.8 10*3/uL (ref 4.0–10.5)

## 2018-03-29 LAB — POCT URINALYSIS DIPSTICK
Bilirubin, UA: NEGATIVE
Glucose, UA: NEGATIVE
KETONES UA: NEGATIVE
LEUKOCYTES UA: NEGATIVE
NITRITE UA: NEGATIVE
PH UA: 7 (ref 5.0–8.0)
Spec Grav, UA: 1.015 (ref 1.010–1.025)
UROBILINOGEN UA: 0.2 U/dL

## 2018-03-29 LAB — FOLATE

## 2018-03-29 LAB — LIPID PANEL
CHOLESTEROL: 158 mg/dL (ref 0–200)
HDL: 66.2 mg/dL (ref >39.00)
LDL CALC: 81 mg/dL (ref 0–99)
NONHDL: 91.5
Total CHOL/HDL Ratio: 2
Triglycerides: 52 mg/dL (ref 0.0–149.0)
VLDL: 10.4 mg/dL (ref 0.0–40.0)

## 2018-03-29 LAB — HEMOGLOBIN A1C: Hgb A1c MFr Bld: 5.4 % (ref 4.6–6.5)

## 2018-03-29 LAB — VITAMIN B12: VITAMIN B 12: 1066 pg/mL — AB (ref 211–911)

## 2018-03-29 MED ORDER — CLOPIDOGREL BISULFATE 75 MG PO TABS: 75.0000 mg | ORAL_TABLET | Freq: Every day | ORAL | 1 refills | Status: DC

## 2018-03-29 MED ORDER — GABAPENTIN 100 MG PO CAPS: 100.0000 mg | ORAL_CAPSULE | Freq: Three times a day (TID) | ORAL | 1 refills | Status: DC

## 2018-03-29 MED ORDER — METOPROLOL TARTRATE 100 MG PO TABS: 100.0000 mg | ORAL_TABLET | Freq: Two times a day (BID) | ORAL | 1 refills | Status: DC

## 2018-03-29 MED ORDER — ATORVASTATIN CALCIUM 40 MG PO TABS: 40.0000 mg | ORAL_TABLET | Freq: Every day | ORAL | 1 refills | Status: DC

## 2018-03-29 MED ORDER — LISINOPRIL 20 MG PO TABS: 20.0000 mg | ORAL_TABLET | Freq: Every day | ORAL | 1 refills | Status: DC

## 2018-03-29 MED ORDER — FOLIC ACID 1 MG PO TABS: 1.0000 mg | ORAL_TABLET | Freq: Every day | ORAL | 1 refills | Status: DC

## 2018-03-29 NOTE — Assessment & Plan Note (Signed)
Patient was asked about his tobacco use today and was strongly advised to quit. Patient is currently pre-contemplative. We reviewed treatment options to assist him quit smoking. He is not ready to attempt to quit today. Follow up at next office visit.   Total time spent counseling approximately 3 minutes.

## 2018-03-29 NOTE — Assessment & Plan Note (Signed)
Continue Lipitor 40 mg daily.  Check lipid panel.

## 2018-03-29 NOTE — Assessment & Plan Note (Signed)
No apparent psychosis today.  Continue with watchful waiting.  Will need referral to psychiatry if starts to show recurrent symptoms.

## 2018-03-29 NOTE — Assessment & Plan Note (Signed)
Encouraged continued cessation from alcohol.

## 2018-03-29 NOTE — Assessment & Plan Note (Signed)
Stable.  Patient's worsening peripheral neuropathy most likely secondary to his stroke.  We will restart gabapentin 100 mg 3 times daily today.  We will also check CBC, TSH, B12, folate, and CMET to look for other causes of peripheral neuropathy.  The symptoms not improving despite gabapentin and above negative work-up, will will likely need repeat brain MRI and/or neurology referral.

## 2018-03-29 NOTE — Assessment & Plan Note (Signed)
Continue Plavix 75 mg daily and Lipitor 40 mg daily.

## 2018-03-29 NOTE — Progress Notes (Signed)
Subjective:  Daniel [name] is a 72 y.o. male who presents today with a chief complaint of urinary frequency and to establish care.  History is provided by patient and his niece.  HPI:  Urinary Frequency, New Problem Started within the past few months.  Patient complains of urinating frequently.  States that he goes about 3-4 times per day.  Does not wake up at night to use the restroom.  No dysuria.  No abdominal pain.  No fevers or chills.  No dribbling or weak stream.  Right foot pain, chronic problem, new to this provider Patient with worsening pins-and-needles pain in his feet bilaterally, however worse in the right.  No obvious precipitating events.  No fevers or chills.  He has a history of a stroke about 10 years ago with subsequent right lower extremity weakness.  Denies any new weakness.  Hypertension, chronic problem, new to this provider Several year history.  Currently on lisinopril 20 mg daily and metoprolol tartrate 100 mg twice daily.  Tolerates both these well without side effects.  Hyperlipidemia/history of stroke, chronic problems, new to this provider As noted above, patient has residual right-sided hemiparesis.  He is currently on Lipitor 80 mg daily and Plavix 75 mg daily.  Tolerates these well without side effects.  History of schizophrenia, chronic problem, new to this provider Patient diagnosed approximately 50 years ago.  Per his niece, he does not have any current symptoms.  He does not see psychiatry for this.  He is not currently on any medications.  Nicotine dependence, chronic problem, new to this provider Currently smokes 2 to 3 cigarettes/day.  ROS: Per HPI, otherwise a complete review of systems was negative.   PMH:  The following were reviewed and entered/updated in epic: Past Medical History:  Diagnosis Date  . Hypertension   . Psychosis ([place])   . Schizophrenia ([place]) 1974  . Stroke [facility]) 2010   Right Hemiparesis   Patient Active Problem  List   Diagnosis Date Noted  . Nicotine dependence with current use 03/29/2018  . Poor dentition 01/17/2015  . Peripheral vascular disease ([place]) 01/18/2014  . Benign hypertension with CKD (chronic kidney disease) stage III ([place]) 01/18/2014  . Hyperlipidemia 08/17/2011  . Hemiplegia, late effect of cerebrovascular disease ([place]) 10/17/2009  . Anemia 10/16/2008  . Substance abuse in remission ([place]) 10/16/2008  . Schizophrenia ([place]) 12/07/1972   History reviewed. No pertinent surgical history.  Family History  Problem Relation Age of Onset  . Hypertension Mother   . CVA Sister   . Breast cancer Sister   . Prostate cancer Brother     Medications- reviewed and updated Current Outpatient Medications  Medication Sig Dispense Refill  . atorvastatin (LIPITOR) 40 MG tablet Take 1 tablet (40 mg total) by mouth daily. 90 tablet 1  . clopidogrel (PLAVIX) 75 MG tablet Take 1 tablet (75 mg total) by mouth daily. 90 tablet 1  . folic acid (FOLVITE) 1 MG tablet Take 1 tablet (1 mg total) by mouth daily. 90 tablet 1  . gabapentin (NEURONTIN) 100 MG capsule Take 1 capsule (100 mg total) by mouth 3 (three) times daily. 270 capsule 1  . lisinopril (PRINIVIL,ZESTRIL) 20 MG tablet Take 1 tablet (20 mg total) by mouth daily. 90 tablet 1  . metoprolol tartrate (LOPRESSOR) 100 MG tablet Take 1 tablet (100 mg total) by mouth 2 (two) times daily. 180 tablet 1  . Multiple Vitamins-Minerals (MULTIVITAMIN WITH MINERALS) tablet Take 1 tablet by mouth daily. [place]  tablet 11   No current facility-administered medications for this visit.     Allergies-reviewed and updated No Known Allergies  Social History   Socioeconomic History  . Marital status: Single    Spouse name: Not on file  . Number of children: Not on file  . Years of education: Not on file  . Highest education level: Not on file  Occupational History  . Not on file  Social Needs  . Financial resource strain: Not on file  . Food insecurity:     Worry: Not on file    Inability: Not on file  . Transportation needs:    Medical: Not on file    Non-medical: Not on file  Tobacco Use  . Smoking status: Current Every Day Smoker    Packs/day: 0.10    Types: Cigarettes  . Smokeless tobacco: Never Used  . Tobacco comment: thinking about.  Cutting back 2-3 cigs per day  Substance and Sexual Activity  . Alcohol use: Not Currently    Alcohol/week: 0.0 oz  . Drug use: Not Currently  . Sexual activity: Not on file  Lifestyle  . Physical activity:    Days per week: Not on file    Minutes per session: Not on file  . Stress: Not on file  Relationships  . Social connections:    Talks on phone: Not on file    Gets together: Not on file    Attends religious service: Not on file    Active member of club or organization: Not on file    Attends meetings of clubs or organizations: Not on file    Relationship status: Not on file  Other Topics Concern  . Not on file  Social History Narrative   Lives with 2 sisters. Has clear signs of psychosis, being cared for younger sister.      Objective:  Physical Exam: BP (!) 170/80 Comment: PT HAS NOT TAKEN B/P MEDS  Pulse 100   Temp 98.2 F (36.8 C) (Oral)   Ht 5\' 9"  (1.753 m)   Wt 143 lb (64.9 kg)   SpO2 100%   BMI 21.12 kg/m   Gen: NAD, resting comfortably CV: RRR with no murmurs appreciated Pulm: NWOB, CTAB with no crackles, wheezes, or rhonchi GI: Normal bowel sounds present. Soft, Nontender, Nondistended. MSK:  -Lower extremities: Contracture noted in right foot.  Trace pretibial edema bilaterally.  Sensation light touch diminished on the right compared to left.  Diffuse weakness in right lower extremity compared to left (chronic finding) Skin: Warm, dry Neuro: Grossly normal, moves all extremities Psych: Blunted affect. Poor eye contact.   Assessment/Plan:  Benign hypertension with CKD (chronic kidney disease) stage III Will, though patient has not been on his blood pressure  medications.  We will refill his lisinopril 20 mg daily and metoprolol tartrate 100 mg twice daily.  He will follow-up with me in 2 to 3 months.  Check CMET today.  Peripheral vascular disease Continue Plavix 75 mg daily and Lipitor 40 mg daily.  Hemiplegia, late effect of cerebrovascular disease ([place]) Stable.  Patient's worsening peripheral neuropathy most likely secondary to his stroke.  We will restart gabapentin 100 mg 3 times daily today.  We will also check CBC, TSH, B12, folate, and CMET to look for other causes of peripheral neuropathy.  The symptoms not improving despite gabapentin and above negative work-up, will will likely need repeat brain MRI and/or neurology referral.  Substance abuse in remission [facility]) Encouraged continued cessation from alcohol.  Hyperlipidemia Continue Lipitor 40 mg daily.  Check lipid panel.  Schizophrenia ([place]) No apparent psychosis today.  Continue with watchful waiting.  Will need referral to psychiatry if starts to show recurrent symptoms.   Nicotine dependence with current use Patient was asked about his tobacco use today and was strongly advised to quit. Patient is currently pre-contemplative. We reviewed treatment options to assist him quit smoking. He is not ready to attempt to quit today. Follow up at next office visit.   Total time spent counseling approximately 3 minutes.    Urinary frequency Based on his history, this does not seem to be pathologic given that he is only urinating 3-4 times daily and he does not have nocturia or any other LUTS symptoms.  We will check UA with urine culture to rule out infection.  Screen for diabetes on blood work as well.  If continues to be problematic, would consider starting low-dose Flomax to treat for any possible underlying BPH.  [name]. [name], MD 03/29/2018 10:17 AM

## 2018-03-29 NOTE — Patient Instructions (Addendum)
We will check blood work today and your urine sample today.   Please let me know if you would like assistance to help with your smoking.  I will refill all of your meds today.  We may start a prostate medication if your urine sample is negative.  Come back to see me in 2-3 months, or sooner as needed.  Take care, Dr [name]

## 2018-03-29 NOTE — Assessment & Plan Note (Signed)
Will, though patient has not been on his blood pressure medications.  We will refill his lisinopril 20 mg daily and metoprolol tartrate 100 mg twice daily.  He will follow-up with me in 2 to 3 months.  Check CMET today.

## 2018-03-30 LAB — URINE CULTURE
MICRO NUMBER: 90495756
RESULT: NO GROWTH
SPECIMEN QUALITY:: ADEQUATE

## 2018-04-11 DIAGNOSIS — Z8673 Personal history of transient ischemic attack (TIA), and cerebral infarction without residual deficits: Secondary | ICD-10-CM | POA: Diagnosis not present

## 2018-04-11 DIAGNOSIS — M792 Neuralgia and neuritis, unspecified: Secondary | ICD-10-CM | POA: Diagnosis not present

## 2018-04-11 DIAGNOSIS — F17210 Nicotine dependence, cigarettes, uncomplicated: Secondary | ICD-10-CM | POA: Diagnosis not present

## 2018-04-11 DIAGNOSIS — Z681 Body mass index (BMI) 19 or less, adult: Secondary | ICD-10-CM | POA: Diagnosis not present

## 2018-04-11 DIAGNOSIS — Z7901 Long term (current) use of anticoagulants: Secondary | ICD-10-CM | POA: Diagnosis not present

## 2018-04-11 DIAGNOSIS — E785 Hyperlipidemia, unspecified: Secondary | ICD-10-CM | POA: Diagnosis not present

## 2018-04-11 DIAGNOSIS — I10 Essential (primary) hypertension: Secondary | ICD-10-CM | POA: Diagnosis not present

## 2018-04-11 DIAGNOSIS — R636 Underweight: Secondary | ICD-10-CM | POA: Diagnosis not present

## 2018-05-27 ENCOUNTER — Telehealth: Payer: Self-pay

## 2018-05-27 NOTE — Telephone Encounter (Signed)
I spoke with the patient's sister, [name], who confirmed that Daniel [name] is the patient's PCP. VDM (DD)

## 2018-06-23 ENCOUNTER — Other Ambulatory Visit: Payer: Self-pay | Admitting: Family Medicine

## 2018-06-23 DIAGNOSIS — I69959 Hemiplegia and hemiparesis following unspecified cerebrovascular disease affecting unspecified side: Secondary | ICD-10-CM

## 2018-09-30 ENCOUNTER — Other Ambulatory Visit: Payer: Self-pay | Admitting: Family Medicine

## 2018-12-30 ENCOUNTER — Other Ambulatory Visit: Payer: Self-pay | Admitting: Family Medicine

## 2019-02-07 ENCOUNTER — Other Ambulatory Visit: Payer: Self-pay | Admitting: Family Medicine

## 2019-05-09 ENCOUNTER — Other Ambulatory Visit: Payer: Self-pay | Admitting: Family Medicine

## 2019-05-12 NOTE — Telephone Encounter (Signed)
Appt scheduled

## 2019-05-15 ENCOUNTER — Ambulatory Visit (INDEPENDENT_AMBULATORY_CARE_PROVIDER_SITE_OTHER): Payer: Medicare PPO | Admitting: Family Medicine

## 2019-05-15 DIAGNOSIS — I69959 Hemiplegia and hemiparesis following unspecified cerebrovascular disease affecting unspecified side: Secondary | ICD-10-CM

## 2019-05-15 DIAGNOSIS — E785 Hyperlipidemia, unspecified: Secondary | ICD-10-CM | POA: Diagnosis not present

## 2019-05-15 DIAGNOSIS — I129 Hypertensive chronic kidney disease with stage 1 through stage 4 chronic kidney disease, or unspecified chronic kidney disease: Secondary | ICD-10-CM

## 2019-05-15 DIAGNOSIS — I739 Peripheral vascular disease, unspecified: Secondary | ICD-10-CM | POA: Diagnosis not present

## 2019-05-15 DIAGNOSIS — F209 Schizophrenia, unspecified: Secondary | ICD-10-CM

## 2019-05-15 DIAGNOSIS — N183 Chronic kidney disease, stage 3 (moderate): Secondary | ICD-10-CM

## 2019-05-15 MED ORDER — FOLIC ACID 1 MG PO TABS: 1.0000 mg | ORAL_TABLET | Freq: Every day | ORAL | 3 refills | Status: DC

## 2019-05-15 MED ORDER — LISINOPRIL 20 MG PO TABS: 20.0000 mg | ORAL_TABLET | Freq: Every day | ORAL | 3 refills | Status: DC

## 2019-05-15 MED ORDER — METOPROLOL TARTRATE 100 MG PO TABS: 100.0000 mg | ORAL_TABLET | Freq: Two times a day (BID) | ORAL | 3 refills | Status: DC

## 2019-05-15 MED ORDER — ATORVASTATIN CALCIUM 40 MG PO TABS: 40.0000 mg | ORAL_TABLET | Freq: Every day | ORAL | 3 refills | Status: DC

## 2019-05-15 MED ORDER — GABAPENTIN 100 MG PO CAPS: ORAL_CAPSULE | ORAL | 3 refills | Status: DC

## 2019-05-15 MED ORDER — CLOPIDOGREL BISULFATE 75 MG PO TABS: 75.0000 mg | ORAL_TABLET | Freq: Every day | ORAL | 3 refills | Status: DC

## 2019-05-15 NOTE — Assessment & Plan Note (Signed)
Check C met with next blood draw.  Will refill lisinopril 2 mg daily and Metroprolol tartrate 100 mg twice daily.

## 2019-05-15 NOTE — Assessment & Plan Note (Signed)
Stable off medications.  

## 2019-05-15 NOTE — Assessment & Plan Note (Signed)
Stable.  Will refill Plavix 75 mg daily and Lipitor 40 mg daily.

## 2019-05-15 NOTE — Assessment & Plan Note (Addendum)
Stable. Continue plavix and statin.   We will refill gabapentin.

## 2019-05-15 NOTE — Assessment & Plan Note (Addendum)
Stable.  Continue Lipitor 40 mg daily.  Check lipid panel with blood draw.

## 2019-05-15 NOTE — Progress Notes (Signed)
    Chief Complaint:  Daniel [name] is a 73 y.o. male who presents for a telephone visit with a chief complaint of HTN follow up.   Assessment/Plan:  Schizophrenia ([place]) Stable off medications.  Peripheral vascular disease Stable.  Will refill Plavix 75 mg daily and Lipitor 40 mg daily.  Hyperlipidemia Stable.  Continue Lipitor 40 mg daily.  Check lipid panel with blood draw.  Hemiplegia, late effect of cerebrovascular disease ([place]) Stable. Continue plavix and statin.   We will refill gabapentin.  Benign hypertension with CKD (chronic kidney disease) stage III Check C met with next blood draw.  Will refill lisinopril 2 mg daily and Metroprolol tartrate 100 mg twice daily.     Subjective:  HPI:  His stable, chronic medical conditions are outlined below:  # Benign hypertension with CKD (chronic kidney disease) stage III - On lisinopril 20 mg daily and metoprolol tartrate 100 mg twice daily. - ROS: No reported chest pain or shortness of breath  # Peripheral vascular disease / Dyslipidemia - On Plavix 75 mg daily and Lipitor 40 mg daily. tolerating well. - ROS: No reported myalgias  # Hemiplegia secondary to stroke / Neuropathy - On gabapentin '100mg'$  three times daily - Symptoms are stable  # Substance abuse in remission - Has been abstinent from alcohol  # Schizophrenia  - Not currently on any medications.    ROS: Per HPI  PMH: He reports that he has been smoking cigarettes. He has been smoking about 0.10 packs per day. He has never used smokeless tobacco. He reports previous alcohol use. He reports previous drug use.      Objective/Observations   NAD, speaking in full sentences.   Telephone Visit   I connected with Daniel [name] on 05/15/19 at 11:00 AM EDT via telephone and verified that I am speaking with the correct person using two identifiers. I discussed the limitations of evaluation and management by telemedicine and the availability of in person  appointments. The patient expressed understanding and agreed to proceed.   Patient location: Home Provider location: [place] participating in the virtual visit: Myself and Patient  A total of 11 minutes were spent on medical discussion.      [name]. [name], MD 05/15/2019 11:46 AM

## 2019-10-13 DIAGNOSIS — I10 Essential (primary) hypertension: Secondary | ICD-10-CM | POA: Diagnosis not present

## 2019-10-13 DIAGNOSIS — Z7902 Long term (current) use of antithrombotics/antiplatelets: Secondary | ICD-10-CM | POA: Diagnosis not present

## 2019-10-13 DIAGNOSIS — F17210 Nicotine dependence, cigarettes, uncomplicated: Secondary | ICD-10-CM | POA: Diagnosis not present

## 2019-10-13 DIAGNOSIS — G629 Polyneuropathy, unspecified: Secondary | ICD-10-CM | POA: Diagnosis not present

## 2019-10-13 DIAGNOSIS — Z6822 Body mass index (BMI) 22.0-22.9, adult: Secondary | ICD-10-CM | POA: Diagnosis not present

## 2019-10-13 DIAGNOSIS — I69398 Other sequelae of cerebral infarction: Secondary | ICD-10-CM | POA: Diagnosis not present

## 2019-10-13 DIAGNOSIS — E785 Hyperlipidemia, unspecified: Secondary | ICD-10-CM | POA: Diagnosis not present

## 2019-10-25 ENCOUNTER — Telehealth: Payer: Self-pay | Admitting: Family Medicine

## 2019-10-25 NOTE — Telephone Encounter (Signed)
I left a message asking the patient to call and schedule Medicare AWV with Courtney ([facility]).  If patient calls back, please schedule Medicare Wellness Visit (initial) at next available opening.  VDM (Dee-Dee) 

## 2019-12-11 ENCOUNTER — Encounter (HOSPITAL_COMMUNITY): Payer: Self-pay | Admitting: Emergency Medicine

## 2019-12-11 ENCOUNTER — Emergency Department (HOSPITAL_COMMUNITY): Payer: Medicare PPO

## 2019-12-11 ENCOUNTER — Inpatient Hospital Stay (HOSPITAL_COMMUNITY): Payer: Medicare PPO

## 2019-12-11 ENCOUNTER — Inpatient Hospital Stay (HOSPITAL_COMMUNITY)
Admission: EM | Admit: 2019-12-11 | Discharge: 2019-12-20 | DRG: 481 | Disposition: A | Discharge status: Home Health | Payer: Medicare PPO | Attending: Internal Medicine | Admitting: Internal Medicine

## 2019-12-11 DIAGNOSIS — R5381 Other malaise: Secondary | ICD-10-CM | POA: Diagnosis not present

## 2019-12-11 DIAGNOSIS — R7989 Other specified abnormal findings of blood chemistry: Secondary | ICD-10-CM

## 2019-12-11 DIAGNOSIS — S299XXA Unspecified injury of thorax, initial encounter: Secondary | ICD-10-CM | POA: Diagnosis not present

## 2019-12-11 DIAGNOSIS — S72001A Fracture of unspecified part of neck of right femur, initial encounter for closed fracture: Secondary | ICD-10-CM

## 2019-12-11 DIAGNOSIS — R531 Weakness: Secondary | ICD-10-CM | POA: Diagnosis not present

## 2019-12-11 DIAGNOSIS — M25571 Pain in right ankle and joints of right foot: Secondary | ICD-10-CM | POA: Diagnosis not present

## 2019-12-11 DIAGNOSIS — E611 Iron deficiency: Secondary | ICD-10-CM | POA: Diagnosis not present

## 2019-12-11 DIAGNOSIS — Z803 Family history of malignant neoplasm of breast: Secondary | ICD-10-CM

## 2019-12-11 DIAGNOSIS — I10 Essential (primary) hypertension: Secondary | ICD-10-CM | POA: Diagnosis not present

## 2019-12-11 DIAGNOSIS — D649 Anemia, unspecified: Secondary | ICD-10-CM | POA: Diagnosis not present

## 2019-12-11 DIAGNOSIS — W19XXXA Unspecified fall, initial encounter: Secondary | ICD-10-CM

## 2019-12-11 DIAGNOSIS — S0990XA Unspecified injury of head, initial encounter: Secondary | ICD-10-CM | POA: Diagnosis not present

## 2019-12-11 DIAGNOSIS — R4182 Altered mental status, unspecified: Secondary | ICD-10-CM | POA: Diagnosis present

## 2019-12-11 DIAGNOSIS — N281 Cyst of kidney, acquired: Secondary | ICD-10-CM | POA: Diagnosis not present

## 2019-12-11 DIAGNOSIS — R404 Transient alteration of awareness: Secondary | ICD-10-CM | POA: Diagnosis not present

## 2019-12-11 DIAGNOSIS — R29818 Other symptoms and signs involving the nervous system: Secondary | ICD-10-CM | POA: Diagnosis not present

## 2019-12-11 DIAGNOSIS — N179 Acute kidney failure, unspecified: Secondary | ICD-10-CM

## 2019-12-11 DIAGNOSIS — E785 Hyperlipidemia, unspecified: Secondary | ICD-10-CM | POA: Diagnosis not present

## 2019-12-11 DIAGNOSIS — E876 Hypokalemia: Secondary | ICD-10-CM | POA: Diagnosis not present

## 2019-12-11 DIAGNOSIS — S82831A Other fracture of upper and lower end of right fibula, initial encounter for closed fracture: Secondary | ICD-10-CM | POA: Diagnosis not present

## 2019-12-11 DIAGNOSIS — D62 Acute posthemorrhagic anemia: Secondary | ICD-10-CM | POA: Diagnosis present

## 2019-12-11 DIAGNOSIS — N183 Chronic kidney disease, stage 3 unspecified: Secondary | ICD-10-CM | POA: Diagnosis present

## 2019-12-11 DIAGNOSIS — K7689 Other specified diseases of liver: Secondary | ICD-10-CM | POA: Diagnosis not present

## 2019-12-11 DIAGNOSIS — I129 Hypertensive chronic kidney disease with stage 1 through stage 4 chronic kidney disease, or unspecified chronic kidney disease: Secondary | ICD-10-CM | POA: Diagnosis present

## 2019-12-11 DIAGNOSIS — M7989 Other specified soft tissue disorders: Secondary | ICD-10-CM | POA: Diagnosis not present

## 2019-12-11 DIAGNOSIS — S72141A Displaced intertrochanteric fracture of right femur, initial encounter for closed fracture: Principal | ICD-10-CM

## 2019-12-11 DIAGNOSIS — S72141D Displaced intertrochanteric fracture of right femur, subsequent encounter for closed fracture with routine healing: Secondary | ICD-10-CM | POA: Diagnosis not present

## 2019-12-11 DIAGNOSIS — M79661 Pain in right lower leg: Secondary | ICD-10-CM | POA: Diagnosis not present

## 2019-12-11 DIAGNOSIS — Z823 Family history of stroke: Secondary | ICD-10-CM | POA: Diagnosis not present

## 2019-12-11 DIAGNOSIS — Z20822 Contact with and (suspected) exposure to covid-19: Secondary | ICD-10-CM | POA: Diagnosis not present

## 2019-12-11 DIAGNOSIS — I69351 Hemiplegia and hemiparesis following cerebral infarction affecting right dominant side: Secondary | ICD-10-CM | POA: Diagnosis not present

## 2019-12-11 DIAGNOSIS — Z7401 Bed confinement status: Secondary | ICD-10-CM | POA: Diagnosis not present

## 2019-12-11 DIAGNOSIS — R55 Syncope and collapse: Secondary | ICD-10-CM | POA: Diagnosis not present

## 2019-12-11 DIAGNOSIS — Z87891 Personal history of nicotine dependence: Secondary | ICD-10-CM | POA: Diagnosis not present

## 2019-12-11 DIAGNOSIS — M2550 Pain in unspecified joint: Secondary | ICD-10-CM | POA: Diagnosis not present

## 2019-12-11 DIAGNOSIS — M25561 Pain in right knee: Secondary | ICD-10-CM | POA: Diagnosis not present

## 2019-12-11 DIAGNOSIS — Z03818 Encounter for observation for suspected exposure to other biological agents ruled out: Secondary | ICD-10-CM | POA: Diagnosis not present

## 2019-12-11 DIAGNOSIS — F209 Schizophrenia, unspecified: Secondary | ICD-10-CM | POA: Diagnosis present

## 2019-12-11 DIAGNOSIS — E782 Mixed hyperlipidemia: Secondary | ICD-10-CM | POA: Diagnosis not present

## 2019-12-11 DIAGNOSIS — M79662 Pain in left lower leg: Secondary | ICD-10-CM | POA: Diagnosis not present

## 2019-12-11 DIAGNOSIS — R7401 Elevation of levels of liver transaminase levels: Secondary | ICD-10-CM | POA: Diagnosis present

## 2019-12-11 DIAGNOSIS — F29 Unspecified psychosis not due to a substance or known physiological condition: Secondary | ICD-10-CM | POA: Diagnosis not present

## 2019-12-11 DIAGNOSIS — R079 Chest pain, unspecified: Secondary | ICD-10-CM | POA: Diagnosis not present

## 2019-12-11 DIAGNOSIS — Z8249 Family history of ischemic heart disease and other diseases of the circulatory system: Secondary | ICD-10-CM

## 2019-12-11 DIAGNOSIS — D631 Anemia in chronic kidney disease: Secondary | ICD-10-CM | POA: Diagnosis not present

## 2019-12-11 DIAGNOSIS — R0902 Hypoxemia: Secondary | ICD-10-CM | POA: Diagnosis not present

## 2019-12-11 DIAGNOSIS — M79671 Pain in right foot: Secondary | ICD-10-CM | POA: Diagnosis not present

## 2019-12-11 DIAGNOSIS — Z8042 Family history of malignant neoplasm of prostate: Secondary | ICD-10-CM | POA: Diagnosis not present

## 2019-12-11 DIAGNOSIS — Z419 Encounter for procedure for purposes other than remedying health state, unspecified: Secondary | ICD-10-CM

## 2019-12-11 LAB — URINALYSIS, ROUTINE W REFLEX MICROSCOPIC
Bilirubin Urine: NEGATIVE
Glucose, UA: NEGATIVE mg/dL
Ketones, ur: NEGATIVE mg/dL
Nitrite: NEGATIVE
Protein, ur: 30 mg/dL — AB
Specific Gravity, Urine: 1.012 (ref 1.005–1.030)
pH: 5 (ref 5.0–8.0)

## 2019-12-11 LAB — COMPREHENSIVE METABOLIC PANEL
ALT: 73 U/L — ABNORMAL HIGH (ref 0–44)
AST: 168 U/L — ABNORMAL HIGH (ref 15–41)
Albumin: 4 g/dL (ref 3.5–5.0)
Alkaline Phosphatase: 60 U/L (ref 38–126)
Anion gap: 13 (ref 5–15)
BUN: 55 mg/dL — ABNORMAL HIGH (ref 8–23)
CO2: 23 mmol/L (ref 22–32)
Calcium: 8.8 mg/dL — ABNORMAL LOW (ref 8.9–10.3)
Chloride: 104 mmol/L (ref 98–111)
Creatinine, Ser: 3.22 mg/dL — ABNORMAL HIGH (ref 0.61–1.24)
GFR calc Af Amer: 21 mL/min — ABNORMAL LOW (ref >60)
GFR calc non Af Amer: 18 mL/min — ABNORMAL LOW (ref >60)
Glucose, Bld: 106 mg/dL — ABNORMAL HIGH (ref 70–99)
Potassium: 3.8 mmol/L (ref 3.5–5.1)
Sodium: 140 mmol/L (ref 135–145)
Total Bilirubin: 1.7 mg/dL — ABNORMAL HIGH (ref 0.3–1.2)
Total Protein: 7.7 g/dL (ref 6.5–8.1)

## 2019-12-11 LAB — CBC WITH DIFFERENTIAL/PLATELET
Abs Immature Granulocytes: 0.07 10*3/uL (ref 0.00–0.07)
Basophils Absolute: 0 10*3/uL (ref 0.0–0.1)
Basophils Relative: 0 %
Eosinophils Absolute: 0 10*3/uL (ref 0.0–0.5)
Eosinophils Relative: 0 %
HCT: 26.2 % — ABNORMAL LOW (ref 39.0–52.0)
Hemoglobin: 8.7 g/dL — ABNORMAL LOW (ref 13.0–17.0)
Immature Granulocytes: 1 %
Lymphocytes Relative: 7 %
Lymphs Abs: 0.9 10*3/uL (ref 0.7–4.0)
MCH: 30.6 pg (ref 26.0–34.0)
MCHC: 33.2 g/dL (ref 30.0–36.0)
MCV: 92.3 fL (ref 80.0–100.0)
Monocytes Absolute: 1.5 10*3/uL — ABNORMAL HIGH (ref 0.1–1.0)
Monocytes Relative: 11 %
Neutro Abs: 10.9 10*3/uL — ABNORMAL HIGH (ref 1.7–7.7)
Neutrophils Relative %: 81 %
Platelets: 211 10*3/uL (ref 150–400)
RBC: 2.84 MIL/uL — ABNORMAL LOW (ref 4.22–5.81)
RDW: 13.2 % (ref 11.5–15.5)
WBC: 13.4 10*3/uL — ABNORMAL HIGH (ref 4.0–10.5)
nRBC: 0 % (ref 0.0–0.2)

## 2019-12-11 MED ORDER — CHLORHEXIDINE GLUCONATE 4 % EX LIQD: 60.0000 mL | Freq: Once | CUTANEOUS | Status: DC

## 2019-12-11 MED ORDER — ACETAMINOPHEN 500 MG PO TABS
1000.0000 mg | ORAL_TABLET | Freq: Once | ORAL | Status: AC
  Administered 2019-12-12: 12:00:00 1000 mg via ORAL
  Filled 2019-12-11: qty 2

## 2019-12-11 MED ORDER — SODIUM CHLORIDE 0.9 % IV SOLN: INTRAVENOUS | Status: DC

## 2019-12-11 MED ORDER — POVIDONE-IODINE 10 % EX SWAB: 2.0000 "application " | Freq: Once | CUTANEOUS | Status: DC

## 2019-12-11 MED ORDER — ADULT MULTIVITAMIN W/MINERALS CH
1.0000 | ORAL_TABLET | Freq: Every day | ORAL | Status: DC
  Administered 2019-12-13 – 2019-12-20 (×8): 1 via ORAL
  Filled 2019-12-11 (×9): qty 1

## 2019-12-11 MED ORDER — FOLIC ACID 1 MG PO TABS
1.0000 mg | ORAL_TABLET | Freq: Every day | ORAL | Status: DC
  Administered 2019-12-13 – 2019-12-20 (×8): 1 mg via ORAL
  Filled 2019-12-11 (×9): qty 1

## 2019-12-11 MED ORDER — ACETAMINOPHEN 325 MG PO TABS
650.0000 mg | ORAL_TABLET | Freq: Four times a day (QID) | ORAL | Status: DC | PRN
  Administered 2019-12-11 – 2019-12-20 (×10): 650 mg via ORAL
  Filled 2019-12-11 (×10): qty 2

## 2019-12-11 MED ORDER — ENSURE PRE-SURGERY PO LIQD
296.0000 mL | Freq: Once | ORAL | Status: AC
  Administered 2019-12-12: 04:00:00 296 mL via ORAL
  Filled 2019-12-11: qty 296

## 2019-12-11 MED ORDER — HEPARIN SODIUM (PORCINE) 5000 UNIT/ML IJ SOLN
5000.0000 [IU] | Freq: Three times a day (TID) | INTRAMUSCULAR | Status: DC
  Administered 2019-12-11: 21:00:00 5000 [IU] via SUBCUTANEOUS
  Filled 2019-12-11: qty 1

## 2019-12-11 MED ORDER — ATORVASTATIN CALCIUM 40 MG PO TABS
40.0000 mg | ORAL_TABLET | Freq: Every day | ORAL | Status: DC
  Administered 2019-12-13 – 2019-12-20 (×8): 40 mg via ORAL
  Filled 2019-12-11 (×9): qty 1

## 2019-12-11 MED ORDER — ACETAMINOPHEN 650 MG RE SUPP: 650.0000 mg | Freq: Four times a day (QID) | RECTAL | Status: DC | PRN

## 2019-12-11 MED ORDER — ONDANSETRON HCL 4 MG PO TABS: 4.0000 mg | ORAL_TABLET | Freq: Four times a day (QID) | ORAL | Status: DC | PRN

## 2019-12-11 MED ORDER — SENNOSIDES-DOCUSATE SODIUM 8.6-50 MG PO TABS: 1.0000 | ORAL_TABLET | Freq: Every evening | ORAL | Status: DC | PRN

## 2019-12-11 MED ORDER — ONDANSETRON HCL 4 MG/2ML IJ SOLN: 4.0000 mg | Freq: Four times a day (QID) | INTRAMUSCULAR | Status: DC | PRN

## 2019-12-11 MED ORDER — LISINOPRIL 20 MG PO TABS
20.0000 mg | ORAL_TABLET | Freq: Every day | ORAL | Status: DC
  Administered 2019-12-13 – 2019-12-20 (×8): 20 mg via ORAL
  Filled 2019-12-11 (×9): qty 1

## 2019-12-11 MED ORDER — CLOPIDOGREL BISULFATE 75 MG PO TABS: 75.0000 mg | ORAL_TABLET | Freq: Every day | ORAL | Status: DC

## 2019-12-11 MED ORDER — METOPROLOL TARTRATE 50 MG PO TABS
100.0000 mg | ORAL_TABLET | Freq: Two times a day (BID) | ORAL | Status: DC
  Administered 2019-12-11 – 2019-12-20 (×18): 100 mg via ORAL
  Filled 2019-12-11 (×18): qty 2

## 2019-12-11 MED ORDER — GABAPENTIN 100 MG PO CAPS
100.0000 mg | ORAL_CAPSULE | Freq: Every day | ORAL | Status: DC
  Administered 2019-12-11 – 2019-12-19 (×9): 100 mg via ORAL
  Filled 2019-12-11 (×9): qty 1

## 2019-12-11 MED ORDER — OXYCODONE HCL 5 MG PO TABS
5.0000 mg | ORAL_TABLET | ORAL | Status: DC | PRN
  Administered 2019-12-11 – 2019-12-20 (×16): 5 mg via ORAL
  Filled 2019-12-11 (×20): qty 1

## 2019-12-11 MED ORDER — SODIUM CHLORIDE 0.9 % IV BOLUS
1000.0000 mL | Freq: Once | INTRAVENOUS | Status: AC
  Administered 2019-12-11: 17:00:00 1000 mL via INTRAVENOUS

## 2019-12-11 MED ORDER — CEFAZOLIN SODIUM-DEXTROSE 2-4 GM/100ML-% IV SOLN
2.0000 g | INTRAVENOUS | Status: AC
  Administered 2019-12-12: 13:00:00 2 g via INTRAVENOUS
  Filled 2019-12-11: qty 100

## 2019-12-11 NOTE — H&P (View-Only) (Signed)
ORTHOPAEDIC CONSULTATION   Chief Complaint: right leg pain  HPI: Daniel [name] is a 74 y.o. male  with past medical history is positive for HTN, CVA with residual right hemiparesis in 2010 on Plavix, schizophrenia, and psychosis. According to triage nurse, EMS came to his home and found him to be clammy, fatigued, and lethargic and brought him to the ED. When patient was questioned, he states that he "just hit the floor" and "maybe he was mopping the floor". When asked other questions, he became somnolent and did not answer.    I called sister Daniel [name], whom he lives with. She states that at baseline he walks with a cane when outside the home and without an assistive device around the house. He was trying to straighten up his bed 2 days ago, had to put pressure on right leg, and fell on side of bed. He stayed on the floor for 2-3 hours after fall until sister could get someone to help get him up (she is in a wheelchair). She states that she questioned him regarding pain and hitting his head and he only complained of his right leg hurting. He has been laying down or in a chair since the fall 2 days ago. He tried to get up and put weight on his right leg again today and fell a second time. Daniel [name] called EMS. She states that the leg has been swollen for "a while now" but seemed more swollen today.   Past Medical History:  Diagnosis Date  . Hypertension   . Psychosis ([place])   . Schizophrenia ([place]) 1974  . Stroke [facility]) 2010   Right Hemiparesis   History reviewed. No pertinent surgical history. Social History   Socioeconomic History  . Marital status: Single    Spouse name: Not on file  . Number of children: Not on file  . Years of education: Not on file  . Highest education level: Not on file  Occupational History  . Not on file  Tobacco Use  . Smoking status: Current Every Day Smoker    Packs/day: 0.10    Types: Cigarettes  . Smokeless tobacco: Never Used  . Tobacco comment: thinking  about.  Cutting back 2-3 cigs per day  Substance and Sexual Activity  . Alcohol use: Not Currently    Alcohol/week: 0.0 standard drinks  . Drug use: Not Currently  . Sexual activity: Not on file  Other Topics Concern  . Not on file  Social History Narrative   Lives with 2 sisters. Has clear signs of psychosis, being cared for younger sister.    Social Determinants of Health   Financial Resource Strain:   . Difficulty of Paying Living Expenses: Not on file  Food Insecurity:   . Worried About Charity fundraiser in the Last Year: Not on file  . Ran Out of Food in the Last Year: Not on file  Transportation Needs:   . Lack of Transportation (Medical): Not on file  . Lack of Transportation (Non-Medical): Not on file  Physical Activity:   . Days of Exercise per Week: Not on file  . Minutes of Exercise per Session: Not on file  Stress:   . Feeling of Stress : Not on file  Social Connections:   . Frequency of Communication with Friends and Family: Not on file  . Frequency of Social Gatherings with Friends and Family: Not on file  . Attends Religious Services: Not on file  . Active Member of Clubs or Organizations:  Not on file  . Attends Archivist Meetings: Not on file  . Marital Status: Not on file   Family History  Problem Relation Age of Onset  . Hypertension Mother   . CVA Sister   . Breast cancer Sister   . Prostate cancer Brother    No Known Allergies   Positive ROS: All other systems have been reviewed and were otherwise negative with the exception of those mentioned in the HPI and as above.  Physical Exam: General: Frail elderly male, laying in bed. In no acute distress.  Cardio: Significant edema to RLE below knee, no LLE edema. Respiratory: No cyanosis, no use of accessory musculature GI: No organomegaly, abdomen is soft and non-tender Skin: No lesions seen near right hip. Neurologic: Unable to be assessed.  Psychiatric: Patient is not competent for  consent. History of psychosis, does not answer questions logically.  Lymphatic: No axillary or cervical lymphadenopathy  MUSCULOSKELETAL: RLE shortened and internally rotated. Grunting illicited from patient with log roll test. No TTP to right ankle. No TTP to right groin or R greater trochanter.  2+ edema to dorsal aspect of right foot. Patient able to follow command to wiggle toes of left foot, but unable to with right. Patient unable to answer questions regarding distal sensation.  R foot radiograph - no body abnormality, soft tissue swelling R ankle radiograph - nondisplaced and incomplete fracture of distal diaphysis of fibula, soft tissue swelling R hip radiographs - acute right intertrochanteric fracture with subtrochanteric extension   Assessment: Active Problems:   Closed right hip fracture, initial encounter ([place])  Plan: R ankle non-displaced incomplete fracture  - will plan for non-operative management  Closed R intertrochanteric femur fracture with subtrochanteric extension - plan for admission to medicine service by Dr. Roosevelt [name] due to anemia, worsening renal function and leukocytosis - plan for surgical management tomorrow with Dr. Mardelle [name] tomorrow afternoon - Non weight bearing RLE until taken to the OR  Risks, benefits, and alternatives of surgical intervention were explained to sister [name] who consented patient for surgery.   The risks benefits and alternatives were discussed with the patient and the family including but not limited to the risks of nonoperative treatment, versus surgical intervention including infection, bleeding, nerve injury, malunion, nonunion, the need for revision surgery, hardware prominence, hardware failure, the need for hardware removal, blood clots, cardiopulmonary complications, morbidity, mortality, among others, and they were willing to proceed.     Daniel Bruns, PA-C Cell [phone]   12/11/2019 6:12 PM  Reviewed, discussed, agree  with above.  Plan for nonsurgical management of his ankle fracture, but he needs surgical intervention to stabilize his hip and femur.  Hopefully he will be medically optimized by tomorrow and we can proceed if appropriate with the medical service.  Appreciate their help.  Johnny Bridge, MD

## 2019-12-11 NOTE — ED Notes (Signed)
Clean patient up and put gown on placed a condom cath patient is resting with call bell in reach

## 2019-12-11 NOTE — ED Triage Notes (Signed)
Pt from home after a fall , had called ems and [facility] had responded but upon their arrival  Pt became clammy and lethargic had fallen a few days ago and had some brusing from that and has rt leg and foot swelling  Pt lives at home with a person in the w/c has some rt sided defit from a previous stroke cbg 134 tem 97.5 150/80  Hear rate 73

## 2019-12-11 NOTE — H&P (Signed)
History and Physical    Daniel [name] [id number] DOB: [birth year] DOA: 12/11/2019  PCP: Daniel Barrack, MD   Patient coming from:home I have personally briefly reviewed patient's old medical records in [place]  Chief Complaint: Falls HPI: Daniel [name] is a 74 y.o. male with medical history significant of HTN, CVA with residual right hemiparesis in 2010 on Plavix, schizophrenia, and psychosis.  Patient was just mumbling most times when I talked to him, also have history obtained from patient history and ED staff. according to sister Daniel [name], whom he lives with patient baseline uses a cane to ambulate. He fell down 2 days ago, trying to get out of bed, and the patient stayed on the floor for 2 hours before he could use some help from neighbor.  Since then patient refused to get out of bed and walk again, continue to complain about the right leg pain and " all locked up of right hip". He tried to get up and put weight on his right leg again today and fell a second time. Daniel [name] called EMS. She states that the leg has been swollen for "a while now" but seemed more swollen today.riage nurse, EMS came to his home and found him to be clammy, fatigued, and lethargic and brought him to the ED. When patient was questioned, he states that he "just hit the floor" and "maybe he was mopping the floor". When asked other questions, he became somnolent and did not answer.      ED Course: Study showed right ankle nondisplaced fracture and right intertrochanteric fracture.  Review of Systems:  unable to perform patient just mumbling most of the time   Past Medical History:  Diagnosis Date   Hypertension    Psychosis ([place])    Schizophrenia ([place]) 1974   Stroke ([place]) 2010   Right Hemiparesis    History reviewed. No pertinent surgical history.   reports that he has been smoking cigarettes. He has been smoking about 0.10 packs per day. He has never used smokeless tobacco. He reports previous  alcohol use. He reports previous drug use.  No Known Allergies  Family History  Problem Relation Age of Onset   Hypertension Mother    CVA Sister    Breast cancer Sister    Prostate cancer Brother      Prior to Admission medications   Medication Sig Start Date End Date Taking? Authorizing Provider  atorvastatin (LIPITOR) 40 MG tablet Take 1 tablet (40 mg total) by mouth daily. 05/15/19  Yes Daniel Barrack, MD  clopidogrel (PLAVIX) 75 MG tablet Take 1 tablet (75 mg total) by mouth daily. 05/15/19  Yes Daniel Barrack, MD  lisinopril (ZESTRIL) 20 MG tablet Take 1 tablet (20 mg total) by mouth daily. 05/15/19  Yes Daniel Barrack, MD  metoprolol tartrate (LOPRESSOR) 100 MG tablet Take 1 tablet (100 mg total) by mouth 2 (two) times daily. 05/15/19  Yes Daniel Barrack, MD  folic acid (FOLVITE) 1 MG tablet Take 1 tablet (1 mg total) by mouth daily. 05/15/19   Daniel Barrack, MD  gabapentin (NEURONTIN) 100 MG capsule TAKE 1 CAPSULE BY MOUTH THREE TIMES A DAY 05/15/19   Daniel Barrack, MD  Multiple Vitamins-Minerals (MULTIVITAMIN WITH MINERALS) tablet Take 1 tablet by mouth daily. 08/17/11   Daniel Fountain, MD    Physical Exam: Vitals:   12/11/19 1645 12/11/19 1700 12/11/19 1725 12/11/19 1806  BP:   (!) 158/94 (!) 173/87  Pulse: 69 77  79 75  Resp: 13 13 14 15   SpO2: 98% 98% 100% 100%    Constitutional: NAD, calm, comfortable Vitals:   12/11/19 1645 12/11/19 1700 12/11/19 1725 12/11/19 1806  BP:   (!) 158/94 (!) 173/87  Pulse: 69 77 79 75  Resp: 13 13 14 15   SpO2: 98% 98% 100% 100%   Eyes: PERRL, lids and conjunctivae normal ENMT: Mucous membranes are moist. Posterior pharynx clear of any exudate or lesions.Normal dentition.  Neck: normal, supple, no masses, no thyromegaly Respiratory: clear to auscultation bilaterally, no wheezing, no crackles. Normal respiratory effort. No accessory muscle use.  Cardiovascular: Regular rate and rhythm, no murmurs / rubs / gallops.  Right side  2+ pitting edema up to the upper shin level. 2+ pedal pulses. No carotid bruits.  Abdomen: no tenderness, no masses palpated. No hepatosplenomegaly. Bowel sounds positive.  Musculoskeletal: no clubbing / cyanosis. No joint deformity upper and lower extremities. Good ROM, no contractures. Normal muscle tone.  Skin: no rashes, lesions, ulcers. No induration Neurologic: Follows simple command but just mumbling otherwise compliant with rest of the neuro exam Psychiatric: Opens eyes, responds to voice,   Labs on Admission: I have personally reviewed following labs and imaging studies  CBC: Recent Labs  Lab 12/11/19 1410  WBC 13.4*  NEUTROABS 10.9*  HGB 8.7*  HCT 26.2*  MCV 92.3  PLT 123456   Basic Metabolic Panel: Recent Labs  Lab 12/11/19 1410  NA 140  K 3.8  CL 104  CO2 23  GLUCOSE 106*  BUN 55*  CREATININE 3.22*  CALCIUM 8.8*   GFR: CrCl cannot be calculated (Unknown ideal weight.). Liver Function Tests: Recent Labs  Lab 12/11/19 1410  AST 168*  ALT 73*  ALKPHOS 60  BILITOT 1.7*  PROT 7.7  ALBUMIN 4.0   No results for input(s): LIPASE, AMYLASE in the last 168 hours. No results for input(s): AMMONIA in the last 168 hours. Coagulation Profile: No results for input(s): INR, PROTIME in the last 168 hours. Cardiac Enzymes: No results for input(s): CKTOTAL, CKMB, CKMBINDEX, TROPONINI in the last 168 hours. BNP (last 3 results) No results for input(s): PROBNP in the last 8760 hours. HbA1C: No results for input(s): HGBA1C in the last 72 hours. CBG: No results for input(s): GLUCAP in the last 168 hours. Lipid Profile: No results for input(s): CHOL, HDL, LDLCALC, TRIG, CHOLHDL, LDLDIRECT in the last 72 hours. Thyroid Function Tests: No results for input(s): TSH, T4TOTAL, FREET4, T3FREE, THYROIDAB in the last 72 hours. Anemia Panel: No results for input(s): VITAMINB12, FOLATE, FERRITIN, TIBC, IRON, RETICCTPCT in the last 72 hours. Urine analysis:    Component Value  Date/Time   COLORURINE YELLOW 12/11/2019 1410   APPEARANCEUR HAZY (A) 12/11/2019 1410   LABSPEC 1.012 12/11/2019 1410   PHURINE 5.0 12/11/2019 1410   GLUCOSEU NEGATIVE 12/11/2019 1410   HGBUR LARGE (A) 12/11/2019 1410   BILIRUBINUR NEGATIVE 12/11/2019 1410   BILIRUBINUR Negative 03/29/2018 1009   KETONESUR NEGATIVE 12/11/2019 1410   PROTEINUR 30 (A) 12/11/2019 1410   UROBILINOGEN 0.2 03/29/2018 1009   UROBILINOGEN 0.2 08/05/2009 2334   NITRITE NEGATIVE 12/11/2019 1410   LEUKOCYTESUR TRACE (A) 12/11/2019 1410    Radiological Exams on Admission: DG Chest 1 View  Result Date: 12/11/2019 CLINICAL DATA:  Pain status post fall EXAM: CHEST  1 VIEW COMPARISON:  07/30/2009 FINDINGS: The heart size and mediastinal contours are within normal limits. Both lungs are clear. The visualized skeletal structures are unremarkable. IMPRESSION: No active disease. Electronically Signed  By: Constance Holster M.D.   On: 12/11/2019 15:42   DG Knee 2 Views Right  Result Date: 12/11/2019 CLINICAL DATA:  Right knee pain after fall today. Initial encounter. EXAM: RIGHT KNEE - 1-2 VIEW COMPARISON:  None. FINDINGS: There is no acute bony or joint abnormality. No joint effusion. Soft tissues anterior to the patella appear mildly swollen which may be due to contusion. IMPRESSION: Negative for fracture. Soft tissue swelling anterior to the patella could be due to contusion. Electronically Signed   By: Inge Rise M.D.   On: 12/11/2019 15:44   DG Ankle Complete Right  Result Date: 12/11/2019 CLINICAL DATA:  Right ankle pain after a fall today. Initial encounter. EXAM: RIGHT ANKLE - COMPLETE 3+ VIEW COMPARISON:  None. FINDINGS: There is soft tissue swelling about the ankle. The patient has a nondisplaced and incomplete appearing fracture of the distal diaphysis of the fibula. No other acute bony abnormality is seen. IMPRESSION: Nondisplaced and incomplete fracture of the distal diaphysis of the fibula. Soft tissue  swelling. Electronically Signed   By: Inge Rise M.D.   On: 12/11/2019 15:42   CT Head Wo Contrast  Result Date: 12/11/2019 CLINICAL DATA:  Neuro deficit(s), subacute. Additional history provided: Patient presents after fall at home, clammy and lethargic, had fallen a few days ago EXAM: CT HEAD WITHOUT CONTRAST TECHNIQUE: Contiguous axial images were obtained from the base of the skull through the vertex without intravenous contrast. COMPARISON:  MRI/MRA head 07/31/2009, head CT 07/30/2009 FINDINGS: Brain: Again demonstrated are now chronic cortical/subcortical infarcts involving the left anterior cerebral artery vascular territory as well as left anterior cerebral artery/middle cerebral artery vascular watershed territory. These infarcts involve the left frontal lobe, left parietooccipital lobes and posterior left temporal lobe. No acute infarct is identified. No evidence of acute intracranial hemorrhage. No midline shift or extra-axial fluid collection. No evidence of intracranial mass. Ill-defined hypoattenuation within the cerebral white matter is nonspecific, but consistent with chronic small vessel ischemic disease. Mild generalized parenchymal atrophy. Vascular: No hyperdense vessel.  Atherosclerotic calcifications. Skull: No calvarial fracture. New from prior examination in 2010, there is a heterogeneous, sclerotic and ground-glass appearance within portions of the left sphenoid wing. This is indeterminate. Sinuses/Orbits: Visualized orbits demonstrate no acute abnormality. Mild scattered paranasal sinus mucosal thickening greatest within the bilateral ethmoid air cells. No significant mastoid effusion. IMPRESSION: No CT evidence of acute intracranial abnormality. Redemonstrated now chronic cortical/subcortical infarcts within the left ACA territory and ACA/MCA watershed territory as described. Generalized parenchymal atrophy and chronic small vessel ischemic disease. New from prior examination in  2010, there is a heterogeneous, sclerotic and ground-glass appearance within portions of the left sphenoid wing. This is nonspecific and of unclear clinical significance. Nonemergent contrast-enhanced brain MRI is recommended for further evaluation, as clinically warranted. Electronically Signed   By: Kellie Simmering DO   On: 12/11/2019 16:59   DG Foot Complete Right  Result Date: 12/11/2019 CLINICAL DATA:  Right foot pain after a fall today. Initial encounter. EXAM: RIGHT FOOT COMPLETE - 3+ VIEW COMPARISON:  None. FINDINGS: Bones are osteopenic. No acute bony or joint abnormality is seen. Soft tissues are diffusely swollen. IMPRESSION: No acute bony abnormality. Diffuse soft tissue swelling. Osteopenia. Electronically Signed   By: Inge Rise M.D.   On: 12/11/2019 15:43   DG Hip Unilat W or Wo Pelvis 2-3 Views Right  Result Date: 12/11/2019 CLINICAL DATA:  Right hip pain after a fall today. Initial encounter. EXAM: DG HIP (WITH OR WITHOUT  PELVIS) 2-3V RIGHT COMPARISON:  None. FINDINGS: The patient has an acute right intertrochanteric fracture with subtrochanteric extension. The lesser trochanter is a separate fragment. No other acute abnormality is identified. IMPRESSION: Acute right intertrochanteric fracture with subtrochanteric extension. Electronically Signed   By: Inge Rise M.D.   On: 12/11/2019 15:43    EKG: Independently reviewed.   Assessment/Plan Active Problems:   Closed right hip fracture, initial encounter ([place])   Closed right intertrochanteric femoral fracture, discussed with orthopedic PA, probably OR tomorrow for ORIF.  Right ankle nondisplaced incomplete fracture, orthopedic PA recommend conservative management, nonweightbearing for now.  Acute blood loss anemia, likely from multifractures on the right leg, type and screen crossmatch for the surgery.  Check iron study.  Right leg swelling, disproportional to the fracture on the ankle will check DVT study.   AKI on  CKD stage III, significant elevation of creatinine level compared to 1 year ago, hydration and recheck. FeNa and renal U/S.  AMS, probably because pain, sister reports mental status has been stable till this morning, but does have frequent mood swings.  Reevaluate tomorrow.  History of stroke with right-sided weakness, hold Plavix for possible OR tomorrow.  Transaminitis, check RUQ ultrasound, repeat liver function tomorrow.        DVT prophylaxis: Heparin subcu Code Status: Code Family Communication: Sister over the phone Disposition Plan: Probably rehab Consults called: Orthopedic PA at bedside Admission status: Telemetry admission   Lequita Halt MD [facility] Pager [phone]  If 7PM-7AM, please contact night-coverage [email or web address] Password [email or web address]  12/11/2019, 7:20 PM

## 2019-12-11 NOTE — ED Provider Notes (Signed)
[facility] EMERGENCY DEPARTMENT Provider Note   CSN: [id number] Arrival date & time: 12/11/19  1332     History Chief Complaint  Patient presents with  . Fatigue  . Fall    Daniel [name] is a 74 y.o. male with PMH significant for CVA on Plavix, schizophrenia, and psychosis who according to EMS in triage presents to the ED via EMS for fatigue and lethargy.  Patient altered on my examination, so obtained history from his sister, Daniel [name], who he lives with.  [name] states that 2 days ago he fell which is unusual for him.  This morning he fell once again, both times claiming that it was mechanical due to right leg issues.  He also denied any head injury when asked by his sister and instead complained of right leg pain.  On my examination, patient states that he has "body sores".  I asked his sister if that is baseline for him and she states that "sometimes he says things that do not make sense".  She ensures that he takes his medications, as prescribed, every day.  She denies him complaining of any recent illness, fevers or chills, chest pain or difficulty breathing, abdominal pain, nausea or vomiting, or other symptoms.   HPI     Past Medical History:  Diagnosis Date  . Hypertension   . Psychosis ([place])   . Schizophrenia ([place]) 1974  . Stroke [facility]) 2010   Right Hemiparesis    Patient Active Problem List   Diagnosis Date Noted  . Nicotine dependence with current use 03/29/2018  . Poor dentition 01/17/2015  . Peripheral vascular disease ([place]) 01/18/2014  . Benign hypertension with CKD (chronic kidney disease) stage III 01/18/2014  . Hyperlipidemia 08/17/2011  . Hemiplegia, late effect of cerebrovascular disease ([place]) 10/17/2009  . Anemia 10/16/2008  . Substance abuse in remission ([place]) 10/16/2008  . Schizophrenia ([place]) 12/07/1972    History reviewed. No pertinent surgical history.     Family History  Problem Relation Age of Onset  . Hypertension Mother   .  CVA Sister   . Breast cancer Sister   . Prostate cancer Brother     Social History   Tobacco Use  . Smoking status: Current Every Day Smoker    Packs/day: 0.10    Types: Cigarettes  . Smokeless tobacco: Never Used  . Tobacco comment: thinking about.  Cutting back 2-3 cigs per day  Substance Use Topics  . Alcohol use: Not Currently    Alcohol/week: 0.0 standard drinks  . Drug use: Not Currently    Home Medications Prior to Admission medications   Medication Sig Start Date End Date Taking? Authorizing Provider  atorvastatin (LIPITOR) 40 MG tablet Take 1 tablet (40 mg total) by mouth daily. 05/15/19   Vivi Barrack, MD  clopidogrel (PLAVIX) 75 MG tablet Take 1 tablet (75 mg total) by mouth daily. 05/15/19   Vivi Barrack, MD  folic acid (FOLVITE) 1 MG tablet Take 1 tablet (1 mg total) by mouth daily. 05/15/19   Vivi Barrack, MD  gabapentin (NEURONTIN) 100 MG capsule TAKE 1 CAPSULE BY MOUTH THREE TIMES A DAY 05/15/19   Vivi Barrack, MD  lisinopril (ZESTRIL) 20 MG tablet Take 1 tablet (20 mg total) by mouth daily. 05/15/19   Vivi Barrack, MD  metoprolol tartrate (LOPRESSOR) 100 MG tablet Take 1 tablet (100 mg total) by mouth 2 (two) times daily. 05/15/19   Vivi Barrack, MD  Multiple Vitamins-Minerals (MULTIVITAMIN WITH  MINERALS) tablet Take 1 tablet by mouth daily. 08/17/11   Trish Fountain, MD    Allergies    Patient has no known allergies.  Review of Systems   Review of Systems  Unable to perform ROS: Mental status change    Physical Exam Updated Vital Signs BP (!) 181/111 (BP Location: Right Arm)   Pulse 69   Resp 18   SpO2 96%   Physical Exam Vitals and nursing note reviewed. Exam conducted with a chaperone present.  Constitutional:      Appearance: Normal appearance.     Comments: Fatigued.  HENT:     Head: Normocephalic and atraumatic.     Comments: No evidence of basilar skull fracture.  No palpable skull defects. Eyes:     General: No scleral  icterus.    Extraocular Movements: Extraocular movements intact.     Conjunctiva/sclera: Conjunctivae normal.  Cardiovascular:     Rate and Rhythm: Normal rate and regular rhythm.     Pulses: Normal pulses.     Heart sounds: Normal heart sounds.  Pulmonary:     Effort: Pulmonary effort is normal. No respiratory distress.     Breath sounds: Normal breath sounds. No wheezing or rales.  Abdominal:     General: Abdomen is flat. There is no distension.     Palpations: Abdomen is soft.     Tenderness: There is no abdominal tenderness. There is no guarding.  Musculoskeletal:     Cervical back: Normal range of motion. No rigidity or tenderness.     Comments: Right leg: Significant below-knee swelling relative left leg.  No significant tenderness to palpation.  Diminished sensation.  Pulses intact.  Ecchymoses over right hamstring.  Skin:    General: Skin is dry.  Neurological:     Mental Status: He is alert.     GCS: GCS eye subscore is 4. GCS verbal subscore is 5. GCS motor subscore is 6.  Psychiatric:     Comments: Intermittently answers questions appropriately. Occasionally strange comments. Sleepy.         ED Results / Procedures / Treatments   Labs (all labs ordered are listed, but only abnormal results are displayed) Labs Reviewed  CBC WITH DIFFERENTIAL/PLATELET - Abnormal; Notable for the following components:      Result Value   WBC 13.4 (*)    RBC 2.84 (*)    Hemoglobin 8.7 (*)    HCT 26.2 (*)    Neutro Abs 10.9 (*)    Monocytes Absolute 1.5 (*)    All other components within normal limits  COMPREHENSIVE METABOLIC PANEL - Abnormal; Notable for the following components:   Glucose, Bld 106 (*)    BUN 55 (*)    Creatinine, Ser 3.22 (*)    Calcium 8.8 (*)    AST 168 (*)    ALT 73 (*)    Total Bilirubin 1.7 (*)    GFR calc non Af Amer 18 (*)    GFR calc Af Amer 21 (*)    All other components within normal limits  URINALYSIS, ROUTINE W REFLEX MICROSCOPIC - Abnormal;  Notable for the following components:   APPearance HAZY (*)    Hgb urine dipstick LARGE (*)    Protein, ur 30 (*)    Leukocytes,Ua TRACE (*)    Bacteria, UA RARE (*)    All other components within normal limits  SARS CORONAVIRUS 2 (TAT 6-24 HRS)  AMMONIA  IRON AND TIBC  FERRITIN  RETICULOCYTES  VITAMIN B12  TYPE AND SCREEN    EKG None  Radiology DG Chest 1 View  Result Date: 12/11/2019 CLINICAL DATA:  Pain status post fall EXAM: CHEST  1 VIEW COMPARISON:  07/30/2009 FINDINGS: The heart size and mediastinal contours are within normal limits. Both lungs are clear. The visualized skeletal structures are unremarkable. IMPRESSION: No active disease. Electronically Signed   By: Constance Holster M.D.   On: 12/11/2019 15:42   DG Knee 2 Views Right  Result Date: 12/11/2019 CLINICAL DATA:  Right knee pain after fall today. Initial encounter. EXAM: RIGHT KNEE - 1-2 VIEW COMPARISON:  None. FINDINGS: There is no acute bony or joint abnormality. No joint effusion. Soft tissues anterior to the patella appear mildly swollen which may be due to contusion. IMPRESSION: Negative for fracture. Soft tissue swelling anterior to the patella could be due to contusion. Electronically Signed   By: Inge Rise M.D.   On: 12/11/2019 15:44   DG Ankle Complete Right  Result Date: 12/11/2019 CLINICAL DATA:  Right ankle pain after a fall today. Initial encounter. EXAM: RIGHT ANKLE - COMPLETE 3+ VIEW COMPARISON:  None. FINDINGS: There is soft tissue swelling about the ankle. The patient has a nondisplaced and incomplete appearing fracture of the distal diaphysis of the fibula. No other acute bony abnormality is seen. IMPRESSION: Nondisplaced and incomplete fracture of the distal diaphysis of the fibula. Soft tissue swelling. Electronically Signed   By: Inge Rise M.D.   On: 12/11/2019 15:42   DG Foot Complete Right  Result Date: 12/11/2019 CLINICAL DATA:  Right foot pain after a fall today. Initial  encounter. EXAM: RIGHT FOOT COMPLETE - 3+ VIEW COMPARISON:  None. FINDINGS: Bones are osteopenic. No acute bony or joint abnormality is seen. Soft tissues are diffusely swollen. IMPRESSION: No acute bony abnormality. Diffuse soft tissue swelling. Osteopenia. Electronically Signed   By: Inge Rise M.D.   On: 12/11/2019 15:43   DG Hip Unilat W or Wo Pelvis 2-3 Views Right  Result Date: 12/11/2019 CLINICAL DATA:  Right hip pain after a fall today. Initial encounter. EXAM: DG HIP (WITH OR WITHOUT PELVIS) 2-3V RIGHT COMPARISON:  None. FINDINGS: The patient has an acute right intertrochanteric fracture with subtrochanteric extension. The lesser trochanter is a separate fragment. No other acute abnormality is identified. IMPRESSION: Acute right intertrochanteric fracture with subtrochanteric extension. Electronically Signed   By: Inge Rise M.D.   On: 12/11/2019 15:43    Procedures Procedures (including critical care time)  Medications Ordered in ED Medications  sodium chloride 0.9 % bolus 1,000 mL (has no administration in time range)    ED Course  I have reviewed the triage vital signs and the nursing notes.  Pertinent labs & imaging results that were available during my care of the patient were reviewed by me and considered in my medical decision making (see chart for details).  Clinical Course as of Dec 10 1699  Mon Dec 11, 2019  1659 Spoke with Dr. Roosevelt Locks who will evaluate and admit patient.   [GG]    Clinical Course User Index [GG] Corena Herter, PA-C   MDM Rules/Calculators/A&P                      Patient is anemic from 12.5 hemoglobin 1 year ago to 8.7 hemoglobin today. Patient is on Plavix.  CBC also remarkable for leukocytosis to 13.4.    Plain films right leg demonstrates an acute right intertrochanteric fracture with subtrochanteric extension as well as a  nondisplaced incomplete fracture of the distal diaphysis of the fibula.  Given patient's anemia, concern for an  acute hemorrhagic bleed.  Evidence of bleeding into the right hamstring from his intertrochanteric fracture.  CT head without contrast still pending.     Consulted with Dr. Mardelle Matte who will ultimately see patient, but would like him to be admitted to medicine.  CT head still pending.  Will consult to medicine for admission for anemia, worsening renal function, and his hip fracture requiring surgical correction.  Spoke with Dr. Roosevelt Locks who will evaluate and admit patient.  Final Clinical Impression(s) / ED Diagnoses Final diagnoses:  Closed displaced intertrochanteric fracture of right femur, initial encounter [facility])    Rx / [place] Orders ED Discharge Orders    None       Corena Herter, PA-C 12/11/19 [place], MD 12/12/19 [phone]

## 2019-12-11 NOTE — Consult Note (Addendum)
ORTHOPAEDIC CONSULTATION   Chief Complaint: right leg pain  HPI: Daniel [name] is a 74 y.o. male  with past medical history is positive for HTN, CVA with residual right hemiparesis in 2010 on Plavix, schizophrenia, and psychosis. According to triage nurse, EMS came to his home and found him to be clammy, fatigued, and lethargic and brought him to the ED. When patient was questioned, he states that he "just hit the floor" and "maybe he was mopping the floor". When asked other questions, he became somnolent and did not answer.    I called sister Daniel [name], whom he lives with. She states that at baseline he walks with a cane when outside the home and without an assistive device around the house. He was trying to straighten up his bed 2 days ago, had to put pressure on right leg, and fell on side of bed. He stayed on the floor for 2-3 hours after fall until sister could get someone to help get him up (she is in a wheelchair). She states that she questioned him regarding pain and hitting his head and he only complained of his right leg hurting. He has been laying down or in a chair since the fall 2 days ago. He tried to get up and put weight on his right leg again today and fell a second time. Daniel [name] called EMS. She states that the leg has been swollen for "a while now" but seemed more swollen today.   Past Medical History:  Diagnosis Date  . Hypertension   . Psychosis ([place])   . Schizophrenia ([place]) 1974  . Stroke [facility]) 2010   Right Hemiparesis   History reviewed. No pertinent surgical history. Social History   Socioeconomic History  . Marital status: Single    Spouse name: Not on file  . Number of children: Not on file  . Years of education: Not on file  . Highest education level: Not on file  Occupational History  . Not on file  Tobacco Use  . Smoking status: Current Every Day Smoker    Packs/day: 0.10    Types: Cigarettes  . Smokeless tobacco: Never Used  . Tobacco comment: thinking  about.  Cutting back 2-3 cigs per day  Substance and Sexual Activity  . Alcohol use: Not Currently    Alcohol/week: 0.0 standard drinks  . Drug use: Not Currently  . Sexual activity: Not on file  Other Topics Concern  . Not on file  Social History Narrative   Lives with 2 sisters. Has clear signs of psychosis, being cared for younger sister.    Social Determinants of Health   Financial Resource Strain:   . Difficulty of Paying Living Expenses: Not on file  Food Insecurity:   . Worried About Charity fundraiser in the Last Year: Not on file  . Ran Out of Food in the Last Year: Not on file  Transportation Needs:   . Lack of Transportation (Medical): Not on file  . Lack of Transportation (Non-Medical): Not on file  Physical Activity:   . Days of Exercise per Week: Not on file  . Minutes of Exercise per Session: Not on file  Stress:   . Feeling of Stress : Not on file  Social Connections:   . Frequency of Communication with Friends and Family: Not on file  . Frequency of Social Gatherings with Friends and Family: Not on file  . Attends Religious Services: Not on file  . Active Member of Clubs or Organizations:  Not on file  . Attends Archivist Meetings: Not on file  . Marital Status: Not on file   Family History  Problem Relation Age of Onset  . Hypertension Mother   . CVA Sister   . Breast cancer Sister   . Prostate cancer Brother    No Known Allergies   Positive ROS: All other systems have been reviewed and were otherwise negative with the exception of those mentioned in the HPI and as above.  Physical Exam: General: Frail elderly male, laying in bed. In no acute distress.  Cardio: Significant edema to RLE below knee, no LLE edema. Respiratory: No cyanosis, no use of accessory musculature GI: No organomegaly, abdomen is soft and non-tender Skin: No lesions seen near right hip. Neurologic: Unable to be assessed.  Psychiatric: Patient is not competent for  consent. History of psychosis, does not answer questions logically.  Lymphatic: No axillary or cervical lymphadenopathy  MUSCULOSKELETAL: RLE shortened and internally rotated. Grunting illicited from patient with log roll test. No TTP to right ankle. No TTP to right groin or R greater trochanter.  2+ edema to dorsal aspect of right foot. Patient able to follow command to wiggle toes of left foot, but unable to with right. Patient unable to answer questions regarding distal sensation.  R foot radiograph - no body abnormality, soft tissue swelling R ankle radiograph - nondisplaced and incomplete fracture of distal diaphysis of fibula, soft tissue swelling R hip radiographs - acute right intertrochanteric fracture with subtrochanteric extension   Assessment: Active Problems:   Closed right hip fracture, initial encounter ([place])  Plan: R ankle non-displaced incomplete fracture  - will plan for non-operative management  Closed R intertrochanteric femur fracture with subtrochanteric extension - plan for admission to medicine service by Dr. Roosevelt Locks due to anemia, worsening renal function and leukocytosis - plan for surgical management tomorrow with Dr. Mardelle Matte tomorrow afternoon - Non weight bearing RLE until taken to the OR  Risks, benefits, and alternatives of surgical intervention were explained to sister [place] who consented patient for surgery.   The risks benefits and alternatives were discussed with the patient and the family including but not limited to the risks of nonoperative treatment, versus surgical intervention including infection, bleeding, nerve injury, malunion, nonunion, the need for revision surgery, hardware prominence, hardware failure, the need for hardware removal, blood clots, cardiopulmonary complications, morbidity, mortality, among others, and they were willing to proceed.     Ventura Bruns, PA-C Cell [phone]   12/11/2019 6:12 PM  Reviewed, discussed, agree  with above.  Plan for nonsurgical management of his ankle fracture, but he needs surgical intervention to stabilize his hip and femur.  Hopefully he will be medically optimized by tomorrow and we can proceed if appropriate with the medical service.  Appreciate their help.  Johnny Bridge, MD

## 2019-12-12 ENCOUNTER — Encounter (HOSPITAL_COMMUNITY)
Admission: EM | Disposition: A | Discharge status: Home Health | Payer: Self-pay | Source: Home / Self Care | Attending: Internal Medicine

## 2019-12-12 ENCOUNTER — Inpatient Hospital Stay (HOSPITAL_COMMUNITY): Payer: Medicare PPO

## 2019-12-12 ENCOUNTER — Encounter (HOSPITAL_COMMUNITY): Payer: Self-pay | Admitting: Internal Medicine

## 2019-12-12 ENCOUNTER — Other Ambulatory Visit: Payer: Self-pay

## 2019-12-12 DIAGNOSIS — M79661 Pain in right lower leg: Secondary | ICD-10-CM

## 2019-12-12 DIAGNOSIS — E782 Mixed hyperlipidemia: Secondary | ICD-10-CM

## 2019-12-12 DIAGNOSIS — M79662 Pain in left lower leg: Secondary | ICD-10-CM

## 2019-12-12 DIAGNOSIS — I10 Essential (primary) hypertension: Secondary | ICD-10-CM

## 2019-12-12 DIAGNOSIS — D649 Anemia, unspecified: Secondary | ICD-10-CM

## 2019-12-12 HISTORY — PX: FEMUR IM NAIL: SHX1597

## 2019-12-12 LAB — COMPREHENSIVE METABOLIC PANEL
ALT: 57 U/L — ABNORMAL HIGH (ref 0–44)
AST: 121 U/L — ABNORMAL HIGH (ref 15–41)
Albumin: 3.2 g/dL — ABNORMAL LOW (ref 3.5–5.0)
Alkaline Phosphatase: 47 U/L (ref 38–126)
Anion gap: 12 (ref 5–15)
BUN: 51 mg/dL — ABNORMAL HIGH (ref 8–23)
CO2: 24 mmol/L (ref 22–32)
Calcium: 7.9 mg/dL — ABNORMAL LOW (ref 8.9–10.3)
Chloride: 104 mmol/L (ref 98–111)
Creatinine, Ser: 2.93 mg/dL — ABNORMAL HIGH (ref 0.61–1.24)
GFR calc Af Amer: 23 mL/min — ABNORMAL LOW (ref >60)
GFR calc non Af Amer: 20 mL/min — ABNORMAL LOW (ref >60)
Glucose, Bld: 122 mg/dL — ABNORMAL HIGH (ref 70–99)
Potassium: 3.4 mmol/L — ABNORMAL LOW (ref 3.5–5.1)
Sodium: 140 mmol/L (ref 135–145)
Total Bilirubin: 1.2 mg/dL (ref 0.3–1.2)
Total Protein: 6 g/dL — ABNORMAL LOW (ref 6.5–8.1)

## 2019-12-12 LAB — RETICULOCYTES
Immature Retic Fract: 36.7 % — ABNORMAL HIGH (ref 2.3–15.9)
RBC.: 2.28 MIL/uL — ABNORMAL LOW (ref 4.22–5.81)
Retic Count, Absolute: 76.6 10*3/uL (ref 19.0–186.0)
Retic Ct Pct: 3.4 % — ABNORMAL HIGH (ref 0.4–3.1)

## 2019-12-12 LAB — CBC
HCT: 20.8 % — ABNORMAL LOW (ref 39.0–52.0)
Hemoglobin: 7.1 g/dL — ABNORMAL LOW (ref 13.0–17.0)
MCH: 31 pg (ref 26.0–34.0)
MCHC: 34.1 g/dL (ref 30.0–36.0)
MCV: 90.8 fL (ref 80.0–100.0)
Platelets: 195 10*3/uL (ref 150–400)
RBC: 2.29 MIL/uL — ABNORMAL LOW (ref 4.22–5.81)
RDW: 12.9 % (ref 11.5–15.5)
WBC: 10.2 10*3/uL (ref 4.0–10.5)
nRBC: 0 % (ref 0.0–0.2)

## 2019-12-12 LAB — IRON AND TIBC
Iron: 28 ug/dL — ABNORMAL LOW (ref 45–182)
Saturation Ratios: 12 % — ABNORMAL LOW (ref 17.9–39.5)
TIBC: 231 ug/dL — ABNORMAL LOW (ref 250–450)
UIBC: 203 ug/dL

## 2019-12-12 LAB — VITAMIN B12: Vitamin B-12: 754 pg/mL (ref 180–914)

## 2019-12-12 LAB — HEMOGLOBIN AND HEMATOCRIT, BLOOD
HCT: 28.9 % — ABNORMAL LOW (ref 39.0–52.0)
Hemoglobin: 9.7 g/dL — ABNORMAL LOW (ref 13.0–17.0)

## 2019-12-12 LAB — FERRITIN: Ferritin: 131 ng/mL (ref 24–336)

## 2019-12-12 LAB — SURGICAL PCR SCREEN
MRSA, PCR: NEGATIVE
Staphylococcus aureus: POSITIVE — AB

## 2019-12-12 LAB — CREATININE, URINE, RANDOM: Creatinine, Urine: 73.45 mg/dL

## 2019-12-12 LAB — AMMONIA: Ammonia: 40 umol/L — ABNORMAL HIGH (ref 9–35)

## 2019-12-12 LAB — PREPARE RBC (CROSSMATCH)

## 2019-12-12 LAB — ABO/RH: ABO/RH(D): B POS

## 2019-12-12 LAB — SARS CORONAVIRUS 2 (TAT 6-24 HRS): SARS Coronavirus 2: NEGATIVE

## 2019-12-12 LAB — SODIUM, URINE, RANDOM: Sodium, Ur: 51 mmol/L

## 2019-12-12 SURGERY — INSERTION, INTRAMEDULLARY ROD, FEMUR
Anesthesia: General | Site: Leg Upper | Laterality: Right

## 2019-12-12 MED ORDER — ENOXAPARIN SODIUM 30 MG/0.3ML ~~LOC~~ SOLN: 30.0000 mg | SUBCUTANEOUS | 0 refills | Status: AC

## 2019-12-12 MED ORDER — HYDROMORPHONE HCL 1 MG/ML IJ SOLN: 0.2500 mg | INTRAMUSCULAR | Status: DC | PRN

## 2019-12-12 MED ORDER — SODIUM CHLORIDE 0.9% IV SOLUTION: Freq: Once | INTRAVENOUS | Status: AC

## 2019-12-12 MED ORDER — 0.9 % SODIUM CHLORIDE (POUR BTL) OPTIME
TOPICAL | Status: DC | PRN
  Administered 2019-12-12: 1000 mL

## 2019-12-12 MED ORDER — PROPOFOL 10 MG/ML IV BOLUS
INTRAVENOUS | Status: DC | PRN
  Administered 2019-12-12: 100 mg via INTRAVENOUS

## 2019-12-12 MED ORDER — OXYCODONE HCL 5 MG PO TABS
ORAL_TABLET | ORAL | Status: AC
  Filled 2019-12-12: qty 1

## 2019-12-12 MED ORDER — ONDANSETRON HCL 4 MG/2ML IJ SOLN
INTRAMUSCULAR | Status: AC
  Filled 2019-12-12: qty 2

## 2019-12-12 MED ORDER — PHENYLEPHRINE HCL-NACL 10-0.9 MG/250ML-% IV SOLN
INTRAVENOUS | Status: DC | PRN
  Administered 2019-12-12: 50 ug/min via INTRAVENOUS

## 2019-12-12 MED ORDER — CHLORHEXIDINE GLUCONATE CLOTH 2 % EX PADS
6.0000 | MEDICATED_PAD | Freq: Every day | CUTANEOUS | Status: AC
  Administered 2019-12-12 – 2019-12-16 (×5): 6 via TOPICAL

## 2019-12-12 MED ORDER — SODIUM CHLORIDE 0.9% IV SOLUTION: Freq: Once | INTRAVENOUS | Status: DC

## 2019-12-12 MED ORDER — ONDANSETRON HCL 4 MG/2ML IJ SOLN: 4.0000 mg | Freq: Once | INTRAMUSCULAR | Status: DC | PRN

## 2019-12-12 MED ORDER — ONDANSETRON HCL 4 MG/2ML IJ SOLN: 4.0000 mg | Freq: Four times a day (QID) | INTRAMUSCULAR | Status: DC | PRN

## 2019-12-12 MED ORDER — SODIUM CHLORIDE 0.9 % IV SOLN: INTRAVENOUS | Status: DC

## 2019-12-12 MED ORDER — MIDAZOLAM HCL 2 MG/2ML IJ SOLN
INTRAMUSCULAR | Status: AC
  Filled 2019-12-12: qty 2

## 2019-12-12 MED ORDER — FENTANYL CITRATE (PF) 250 MCG/5ML IJ SOLN
INTRAMUSCULAR | Status: AC
  Filled 2019-12-12: qty 5

## 2019-12-12 MED ORDER — OXYCODONE HCL 5 MG/5ML PO SOLN: 5.0000 mg | Freq: Once | ORAL | Status: AC | PRN

## 2019-12-12 MED ORDER — MORPHINE SULFATE (PF) 2 MG/ML IV SOLN
0.5000 mg | INTRAVENOUS | Status: DC | PRN
  Administered 2019-12-13 – 2019-12-18 (×5): 1 mg via INTRAVENOUS
  Filled 2019-12-12 (×5): qty 1

## 2019-12-12 MED ORDER — EPHEDRINE 5 MG/ML INJ
INTRAVENOUS | Status: AC
  Filled 2019-12-12: qty 10

## 2019-12-12 MED ORDER — METOCLOPRAMIDE HCL 5 MG/ML IJ SOLN: 5.0000 mg | Freq: Three times a day (TID) | INTRAMUSCULAR | Status: DC | PRN

## 2019-12-12 MED ORDER — EPHEDRINE SULFATE-NACL 50-0.9 MG/10ML-% IV SOSY
PREFILLED_SYRINGE | INTRAVENOUS | Status: DC | PRN
  Administered 2019-12-12: 10 mg via INTRAVENOUS

## 2019-12-12 MED ORDER — FENTANYL CITRATE (PF) 250 MCG/5ML IJ SOLN
INTRAMUSCULAR | Status: DC | PRN
  Administered 2019-12-12: 100 ug via INTRAVENOUS
  Administered 2019-12-12: 50 ug via INTRAVENOUS

## 2019-12-12 MED ORDER — ONDANSETRON HCL 4 MG PO TABS: 4.0000 mg | ORAL_TABLET | Freq: Four times a day (QID) | ORAL | Status: DC | PRN

## 2019-12-12 MED ORDER — PHENYLEPHRINE 40 MCG/ML (10ML) SYRINGE FOR IV PUSH (FOR BLOOD PRESSURE SUPPORT)
PREFILLED_SYRINGE | INTRAVENOUS | Status: AC
  Filled 2019-12-12: qty 10

## 2019-12-12 MED ORDER — LACTATED RINGERS IV SOLN: INTRAVENOUS | Status: DC

## 2019-12-12 MED ORDER — HYDROCODONE-ACETAMINOPHEN 5-325 MG PO TABS: 1.0000 | ORAL_TABLET | ORAL | 0 refills | Status: DC | PRN

## 2019-12-12 MED ORDER — POLYETHYLENE GLYCOL 3350 17 G PO PACK: 17.0000 g | PACK | Freq: Every day | ORAL | Status: DC | PRN

## 2019-12-12 MED ORDER — ROCURONIUM BROMIDE 10 MG/ML (PF) SYRINGE
PREFILLED_SYRINGE | INTRAVENOUS | Status: DC | PRN
  Administered 2019-12-12: 40 mg via INTRAVENOUS

## 2019-12-12 MED ORDER — METOCLOPRAMIDE HCL 5 MG PO TABS: 5.0000 mg | ORAL_TABLET | Freq: Three times a day (TID) | ORAL | Status: DC | PRN

## 2019-12-12 MED ORDER — SUGAMMADEX SODIUM 200 MG/2ML IV SOLN
INTRAVENOUS | Status: DC | PRN
  Administered 2019-12-12: 160 mg via INTRAVENOUS

## 2019-12-12 MED ORDER — PHENYLEPHRINE 40 MCG/ML (10ML) SYRINGE FOR IV PUSH (FOR BLOOD PRESSURE SUPPORT)
PREFILLED_SYRINGE | INTRAVENOUS | Status: DC | PRN
  Administered 2019-12-12: 80 ug via INTRAVENOUS
  Administered 2019-12-12: 200 ug via INTRAVENOUS
  Administered 2019-12-12: 120 ug via INTRAVENOUS

## 2019-12-12 MED ORDER — OXYCODONE HCL 5 MG PO TABS
5.0000 mg | ORAL_TABLET | Freq: Once | ORAL | Status: AC | PRN
  Administered 2019-12-12: 15:00:00 5 mg via ORAL

## 2019-12-12 MED ORDER — ENOXAPARIN SODIUM 30 MG/0.3ML ~~LOC~~ SOLN
30.0000 mg | SUBCUTANEOUS | Status: DC
  Administered 2019-12-13 – 2019-12-20 (×8): 30 mg via SUBCUTANEOUS
  Filled 2019-12-12 (×8): qty 0.3

## 2019-12-12 MED ORDER — LIDOCAINE 2% (20 MG/ML) 5 ML SYRINGE
INTRAMUSCULAR | Status: DC | PRN
  Administered 2019-12-12: 60 mg via INTRAVENOUS

## 2019-12-12 MED ORDER — FLEET ENEMA 7-19 GM/118ML RE ENEM: 1.0000 | ENEMA | Freq: Once | RECTAL | Status: DC | PRN

## 2019-12-12 MED ORDER — SODIUM CHLORIDE 0.9 % IV SOLN
INTRAVENOUS | Status: DC | PRN
  Administered 2019-12-12 (×2): 250 mL via INTRAVENOUS

## 2019-12-12 MED ORDER — PHENYLEPHRINE HCL (PRESSORS) 10 MG/ML IV SOLN
INTRAVENOUS | Status: AC
  Filled 2019-12-12: qty 1

## 2019-12-12 MED ORDER — MUPIROCIN 2 % EX OINT
1.0000 "application " | TOPICAL_OINTMENT | Freq: Two times a day (BID) | CUTANEOUS | Status: AC
  Administered 2019-12-12 – 2019-12-16 (×10): 1 via NASAL
  Filled 2019-12-12 (×4): qty 22

## 2019-12-12 MED ORDER — DEXAMETHASONE SODIUM PHOSPHATE 10 MG/ML IJ SOLN
INTRAMUSCULAR | Status: AC
  Filled 2019-12-12: qty 1

## 2019-12-12 MED ORDER — ONDANSETRON HCL 4 MG/2ML IJ SOLN
INTRAMUSCULAR | Status: DC | PRN
  Administered 2019-12-12: 4 mg via INTRAVENOUS

## 2019-12-12 MED ORDER — CEFAZOLIN SODIUM-DEXTROSE 1-4 GM/50ML-% IV SOLN
1.0000 g | Freq: Four times a day (QID) | INTRAVENOUS | Status: AC
  Administered 2019-12-12 – 2019-12-13 (×2): 1 g via INTRAVENOUS
  Filled 2019-12-12 (×3): qty 50

## 2019-12-12 MED ORDER — BISACODYL 10 MG RE SUPP: 10.0000 mg | Freq: Every day | RECTAL | Status: DC | PRN

## 2019-12-12 MED ORDER — SODIUM CHLORIDE 0.9 % IV SOLN: INTRAVENOUS | Status: DC | PRN

## 2019-12-12 SURGICAL SUPPLY — 50 items
APL SKNCLS STERI-STRIP NONHPOA (GAUZE/BANDAGES/DRESSINGS) ×1
BENZOIN TINCTURE PRP APPL 2/3 (GAUZE/BANDAGES/DRESSINGS) ×3 IMPLANT
BIT DRILL AO GAMMA 4.2X180 (BIT) ×2 IMPLANT
BOOTCOVER CLEANROOM LRG (PROTECTIVE WEAR) ×6 IMPLANT
CLOSURE STERI-STRIP 1/2X4 (GAUZE/BANDAGES/DRESSINGS) ×1
CLSR STERI-STRIP ANTIMIC 1/2X4 (GAUZE/BANDAGES/DRESSINGS) ×2 IMPLANT
COVER PERINEAL POST (MISCELLANEOUS) ×3 IMPLANT
COVER SURGICAL LIGHT HANDLE (MISCELLANEOUS) ×3 IMPLANT
COVER WAND RF STERILE (DRAPES) ×3 IMPLANT
DRAPE STERI IOBAN 125X83 (DRAPES) ×3 IMPLANT
DRSG MEPILEX BORDER 4X4 (GAUZE/BANDAGES/DRESSINGS) ×5 IMPLANT
DRSG MEPILEX BORDER 4X8 (GAUZE/BANDAGES/DRESSINGS) ×6 IMPLANT
DURAPREP 26ML APPLICATOR (WOUND CARE) ×3 IMPLANT
ELECT CAUTERY BLADE 6.4 (BLADE) ×3 IMPLANT
ELECT REM PT RETURN 9FT ADLT (ELECTROSURGICAL) ×3
ELECTRODE REM PT RTRN 9FT ADLT (ELECTROSURGICAL) ×1 IMPLANT
EVACUATOR 1/8 PVC DRAIN (DRAIN) IMPLANT
FACESHIELD WRAPAROUND (MASK) ×6 IMPLANT
FACESHIELD WRAPAROUND OR TEAM (MASK) ×2 IMPLANT
GAUZE XEROFORM 5X9 LF (GAUZE/BANDAGES/DRESSINGS) ×3 IMPLANT
GLOVE BIOGEL PI ORTHO PRO SZ8 (GLOVE) ×4
GLOVE ORTHO TXT STRL SZ7.5 (GLOVE) ×6 IMPLANT
GLOVE PI ORTHO PRO STRL SZ8 (GLOVE) ×2 IMPLANT
GLOVE SURG ORTHO 8.0 STRL STRW (GLOVE) ×6 IMPLANT
GOWN STRL REUS W/ TWL XL LVL3 (GOWN DISPOSABLE) ×1 IMPLANT
GOWN STRL REUS W/TWL 2XL LVL3 (GOWN DISPOSABLE) IMPLANT
GOWN STRL REUS W/TWL XL LVL3 (GOWN DISPOSABLE) ×3
GUIDEROD T2 3X1000 (ROD) ×2 IMPLANT
K-WIRE  3.2X450M STR (WIRE) ×2
K-WIRE 3.2X450M STR (WIRE) ×1
KIT TURNOVER KIT B (KITS) ×3 IMPLANT
KWIRE 3.2X450M STR (WIRE) IMPLANT
MANIFOLD NEPTUNE II (INSTRUMENTS) ×3 IMPLANT
NAIL GAMMA LG R 5TI 10X400X125 (Nail) ×2 IMPLANT
NS IRRIG 1000ML POUR BTL (IV SOLUTION) ×3 IMPLANT
PACK GENERAL/GYN (CUSTOM PROCEDURE TRAY) ×3 IMPLANT
PAD ARMBOARD 7.5X6 YLW CONV (MISCELLANEOUS) ×6 IMPLANT
SCREW LAG GAMMA 3 95MM (Screw) ×2 IMPLANT
SCREW LOCKING T2 F/T  5MMX45MM (Screw) ×2 IMPLANT
SCREW LOCKING T2 F/T 5MMX45MM (Screw) IMPLANT
STAPLER VISISTAT 35W (STAPLE) ×3 IMPLANT
SUT VIC AB 0 CT1 27 (SUTURE) ×3
SUT VIC AB 0 CT1 27XBRD ANBCTR (SUTURE) ×1 IMPLANT
SUT VIC AB 2-0 FS1 27 (SUTURE) ×3 IMPLANT
SUT VIC AB 2-0 SH 27 (SUTURE)
SUT VIC AB 2-0 SH 27XBRD (SUTURE) IMPLANT
SUT VIC AB 3-0 SH 8-18 (SUTURE) ×3 IMPLANT
TOWEL GREEN STERILE (TOWEL DISPOSABLE) ×3 IMPLANT
TOWEL GREEN STERILE FF (TOWEL DISPOSABLE) ×3 IMPLANT
WATER STERILE IRR 1000ML POUR (IV SOLUTION) ×3 IMPLANT

## 2019-12-12 NOTE — Anesthesia Preprocedure Evaluation (Addendum)
Anesthesia Evaluation  Patient identified by MRN, date of birth, ID band Patient awake    Reviewed: Allergy & Precautions, NPO status , Patient's Chart, lab work & pertinent test results  Airway Mallampati: II  TM Distance: >3 FB Neck ROM: Full    Dental  (+) Poor Dentition, Chipped, Edentulous Upper, Dental Advisory Given   Pulmonary Current Smoker,    Pulmonary exam normal breath sounds clear to auscultation       Cardiovascular hypertension, Normal cardiovascular exam Rhythm:Regular Rate:Normal     Neuro/Psych PSYCHIATRIC DISORDERS Schizophrenia R hemiplegia CVA    GI/Hepatic   Endo/Other  negative endocrine ROS  Renal/GU K+ 3.4 Cr 2.93     Musculoskeletal negative musculoskeletal ROS (+)   Abdominal   Peds  Hematology  (+) anemia ,   Anesthesia Other Findings On plavix  Reproductive/Obstetrics                           Anesthesia Physical Anesthesia Plan  ASA: IV  Anesthesia Plan: General   Post-op Pain Management:    Induction: Intravenous  PONV Risk Score and Plan: 2 and Treatment may vary due to age or medical condition and Ondansetron  Airway Management Planned: Oral ETT  Additional Equipment: None  Intra-op Plan:   Post-operative Plan: Extubation in OR  Informed Consent: I have reviewed the patients History and Physical, chart, labs and discussed the procedure including the risks, benefits and alternatives for the proposed anesthesia with the patient or authorized representative who has indicated his/her understanding and acceptance.     Dental advisory given  Plan Discussed with:   Anesthesia Plan Comments:        Anesthesia Quick Evaluation

## 2019-12-12 NOTE — Progress Notes (Addendum)
PROGRESS NOTE    Daniel [name]  [id number] DOB: Jul 05, 1946 DOA: 12/11/2019 PCP: Vivi Barrack, MD (Confirm with patient/family/NH records and if not entered, this HAS to be entered at [facility] point of entry. "No PCP" if truly none.)   Brief Narrative:  As per Dr. Roosevelt Locks HPI: Daniel [name] is a 74 y.o. male with medical history significant of HTN, CVA with residual right hemiparesis in 2010 on Plavix, schizophrenia, and psychosis.  Patient was just mumbling most times when I talked to him, also have history obtained from patient history and ED staff. according to sister [name], whom he lives with patient baseline uses a cane to ambulate. He fell down 2 days ago, trying to get out of bed, and the patient stayed on the floor for 2 hours before he could use some help from neighbor.  Since then patient refused to get out of bed and walk again, continue to complain about the right leg pain and " all locked up of right hip". He tried to get up and put weight on his right leg again today and fell a second time. [name] called EMS. She states that the leg has been swollen for "a while now" but seemed more swollen today.riage nurse, EMS came to his home and found him to be clammy, fatigued, and lethargic and brought him to the ED. When patient was questioned, he states that he "just hit the floor" and "maybe he was mopping the floor". When asked other questions, he became somnolent and did not answer.   Plan for OR 12/12/19 for hip fracture, ordered 2 units PRBC for Hg 7.1 12/12/19  Assessment & Plan:   Active Problems:   Closed displaced intertrochanteric fracture of right femur ([place])  Right closed intertrochanteric fracture right femur: -Likely OR 12/12/19 for repair -PT/OT per orthopedic recommendations -pain control with oxycodone and gabapentin -senokot/d for bowel regimen  AKI: -admit Cr 3.22 trending down to 2.93 today -on IVF due to NPO will need to continue peri-operatively -monitor Cr  daily -avoid nephrotoxic agents  Anemia: -Likely some dilutional component 8.7 on admit to 7.1 12/12/19 -Ordered 2 units PRBC prior to OR after consult with orthopedics -anemia panel 12/11/19 iron 28, ferritin 131, B12 754 -monitor CBC daily  Elevated LFTs: -could be from acute trauma -trending down AST 168-121, ALT 73-57, bili 1.7-1.2 -no abdominal tenderness, if worsening may need hepatitis panel and RUQ ultrasound  HTN: -home meds lisinopril, metoprolol  -BP 140-160/80s so continue home meds -pain control   Hyperlipidemia: -continue home lipitor  DVT prophylaxis: heparin Code Status: full Family Communication: no family at bedside Disposition Plan: per orthopedics post operatively  Consultants:   Orthopedics  Procedures:   OR planned 12/12/19 Dr. Mardelle Matte  Antimicrobials:   Ancef peri-operatively   Subjective: Right leg is hurting this morning. Slept okay. Denies chest pains, cough, SOB. Denies abdominal pain, nausea/vomiting, diarrhea/constipation. Overall doing well. Wants to get hip fixed and get better.   Objective: Vitals:   12/11/19 1948 12/11/19 2119 12/12/19 0402 12/12/19 0758  BP: (!) 161/71 (!) 155/93 (!) 158/80 (!) 163/85  Pulse: 66 91 70 76  Resp: 12 18 18 17   Temp:  98.4 F (36.9 C) 98.7 F (37.1 C) 99.2 F (37.3 C)  TempSrc:  Oral Oral Oral  SpO2: 100% 100% 100% 100%  Weight:  67.5 kg    Height:  6\' 1"  (1.854 m)      Intake/Output Summary (Last 24 hours) at 12/12/2019 1059 Last data filed  at 12/12/2019 0900 Gross per 24 hour  Intake 841.67 ml  Output --  Net 841.67 ml   Filed Weights   12/11/19 2119  Weight: 67.5 kg    Examination:  General exam: Appears calm and comfortable  Respiratory system: Clear to auscultation. Respiratory effort normal. Cardiovascular system: S1 & S2 heard, RRR. No JVD, murmurs, rubs, gallops or clicks. No pedal edema. Gastrointestinal system: Abdomen is nondistended, soft and nontender. No organomegaly or  masses felt. Normal bowel sounds heard. Central nervous system: Alert and oriented. No focal neurological deficits. Extremities: Symmetric 5 x 5 power. Skin: No rashes, lesions or ulcers Psychiatry: Judgement and insight appear normal. Mood & affect appropriate.   Data Reviewed: I have personally reviewed following labs and imaging studies  CBC: Recent Labs  Lab 12/11/19 1410 12/12/19 0009  WBC 13.4* 10.2  NEUTROABS 10.9*  --   HGB 8.7* 7.1*  HCT 26.2* 20.8*  MCV 92.3 90.8  PLT 211 0000000   Basic Metabolic Panel: Recent Labs  Lab 12/11/19 1410 12/12/19 0009  NA 140 140  K 3.8 3.4*  CL 104 104  CO2 23 24  GLUCOSE 106* 122*  BUN 55* 51*  CREATININE 3.22* 2.93*  CALCIUM 8.8* 7.9*   GFR: Estimated Creatinine Clearance: 21.1 mL/min (A) (by C-G formula based on SCr of 2.93 mg/dL (H)). Liver Function Tests: Recent Labs  Lab 12/11/19 1410 12/12/19 0009  AST 168* 121*  ALT 73* 57*  ALKPHOS 60 47  BILITOT 1.7* 1.2  PROT 7.7 6.0*  ALBUMIN 4.0 3.2*   No results for input(s): LIPASE, AMYLASE in the last 168 hours. Recent Labs  Lab 12/12/19 0009  AMMONIA 40*   Coagulation Profile: No results for input(s): INR, PROTIME in the last 168 hours. Cardiac Enzymes: No results for input(s): CKTOTAL, CKMB, CKMBINDEX, TROPONINI in the last 168 hours. BNP (last 3 results) No results for input(s): PROBNP in the last 8760 hours. HbA1C: No results for input(s): HGBA1C in the last 72 hours. CBG: No results for input(s): GLUCAP in the last 168 hours. Lipid Profile: No results for input(s): CHOL, HDL, LDLCALC, TRIG, CHOLHDL, LDLDIRECT in the last 72 hours. Thyroid Function Tests: No results for input(s): TSH, T4TOTAL, FREET4, T3FREE, THYROIDAB in the last 72 hours. Anemia Panel: Recent Labs    12/12/19 0009  VITAMINB12 754  FERRITIN 131  TIBC 231*  IRON 28*  RETICCTPCT 3.4*   Sepsis Labs: No results for input(s): PROCALCITON, LATICACIDVEN in the last 168 hours.  Recent  Results (from the past 240 hour(s))  Surgical pcr screen     Status: Abnormal   Collection Time: 12/11/19  9:24 PM   Specimen: Nasal Mucosa; Nasal Swab  Result Value Ref Range Status   MRSA, PCR NEGATIVE NEGATIVE Final   Staphylococcus aureus POSITIVE (A) NEGATIVE Final    Comment: (NOTE) The Xpert SA Assay (FDA approved for NASAL specimens in patients 26 years of age and older), is one component of a comprehensive surveillance program. It is not intended to diagnose infection nor to guide or monitor treatment. Performed at [facility], [place] [address] [place], [place] [address]   SARS CORONAVIRUS 2 (TAT 6-24 HRS) Nasopharyngeal Nasopharyngeal Swab     Status: None   Collection Time: 12/11/19  9:45 PM   Specimen: Nasopharyngeal Swab  Result Value Ref Range Status   SARS Coronavirus 2 NEGATIVE NEGATIVE Final    Comment: (NOTE) SARS-CoV-2 target nucleic acids are NOT DETECTED. The SARS-CoV-2 RNA is generally detectable in  upper and lower respiratory specimens during the acute phase of infection. Negative results do not preclude SARS-CoV-2 infection, do not rule out co-infections with other pathogens, and should not be used as the sole basis for treatment or other patient management decisions. Negative results must be combined with clinical observations, patient history, and epidemiological information. The expected result is Negative. Fact Sheet for Patients: SugarRoll.be Fact Sheet for Healthcare Providers: https://www.woods-mathews.com/ This test is not yet approved or cleared by the [place] FDA and  has been authorized for detection and/or diagnosis of SARS-CoV-2 by FDA under an Emergency Use Authorization (EUA). This EUA will remain  in effect (meaning this test can be used) for the duration of the COVID-19 declaration under Section 56 4(b)(1) of the Act, 21 U.S.C. section 360bbb-3(b)(1), unless the authorization is  terminated or revoked sooner. Performed at [facility], [place] [address], [place], [place] [address]      Radiology Studies: DG Chest 1 View  Result Date: 12/11/2019 CLINICAL DATA:  Pain status post fall EXAM: CHEST  1 VIEW COMPARISON:  07/30/2009 FINDINGS: The heart size and mediastinal contours are within normal limits. Both lungs are clear. The visualized skeletal structures are unremarkable. IMPRESSION: No active disease. Electronically Signed   By: Constance Holster M.D.   On: 12/11/2019 15:42   DG Knee 2 Views Right  Result Date: 12/11/2019 CLINICAL DATA:  Right knee pain after fall today. Initial encounter. EXAM: RIGHT KNEE - 1-2 VIEW COMPARISON:  None. FINDINGS: There is no acute bony or joint abnormality. No joint effusion. Soft tissues anterior to the patella appear mildly swollen which may be due to contusion. IMPRESSION: Negative for fracture. Soft tissue swelling anterior to the patella could be due to contusion. Electronically Signed   By: Inge Rise M.D.   On: 12/11/2019 15:44   DG Ankle Complete Right  Result Date: 12/11/2019 CLINICAL DATA:  Right ankle pain after a fall today. Initial encounter. EXAM: RIGHT ANKLE - COMPLETE 3+ VIEW COMPARISON:  None. FINDINGS: There is soft tissue swelling about the ankle. The patient has a nondisplaced and incomplete appearing fracture of the distal diaphysis of the fibula. No other acute bony abnormality is seen. IMPRESSION: Nondisplaced and incomplete fracture of the distal diaphysis of the fibula. Soft tissue swelling. Electronically Signed   By: Inge Rise M.D.   On: 12/11/2019 15:42   CT Head Wo Contrast  Result Date: 12/11/2019 CLINICAL DATA:  Neuro deficit(s), subacute. Additional history provided: Patient presents after fall at home, clammy and lethargic, had fallen a few days ago EXAM: CT HEAD WITHOUT CONTRAST TECHNIQUE: Contiguous axial images were obtained from the base of the skull through the vertex without  intravenous contrast. COMPARISON:  MRI/MRA head 07/31/2009, head CT 07/30/2009 FINDINGS: Brain: Again demonstrated are now chronic cortical/subcortical infarcts involving the left anterior cerebral artery vascular territory as well as left anterior cerebral artery/middle cerebral artery vascular watershed territory. These infarcts involve the left frontal lobe, left parietooccipital lobes and posterior left temporal lobe. No acute infarct is identified. No evidence of acute intracranial hemorrhage. No midline shift or extra-axial fluid collection. No evidence of intracranial mass. Ill-defined hypoattenuation within the cerebral white matter is nonspecific, but consistent with chronic small vessel ischemic disease. Mild generalized parenchymal atrophy. Vascular: No hyperdense vessel.  Atherosclerotic calcifications. Skull: No calvarial fracture. New from prior examination in 2010, there is a heterogeneous, sclerotic and ground-glass appearance within portions of the left sphenoid wing. This is indeterminate. Sinuses/Orbits: Visualized orbits demonstrate no  acute abnormality. Mild scattered paranasal sinus mucosal thickening greatest within the bilateral ethmoid air cells. No significant mastoid effusion. IMPRESSION: No CT evidence of acute intracranial abnormality. Redemonstrated now chronic cortical/subcortical infarcts within the left ACA territory and ACA/MCA watershed territory as described. Generalized parenchymal atrophy and chronic small vessel ischemic disease. New from prior examination in 2010, there is a heterogeneous, sclerotic and ground-glass appearance within portions of the left sphenoid wing. This is nonspecific and of unclear clinical significance. Nonemergent contrast-enhanced brain MRI is recommended for further evaluation, as clinically warranted. Electronically Signed   By: Kellie Simmering DO   On: 12/11/2019 16:59   US RENAL  Result Date: 12/11/2019 CLINICAL DATA:  Hypertension EXAM: RENAL /  URINARY TRACT ULTRASOUND COMPLETE COMPARISON:  None. FINDINGS: Right Kidney: Renal measurements: 9.4 x 4.2 x 4.3 cm. = volume: 89 mL. Slight increased echogenicity is noted. No hydronephrosis is seen. Left Kidney: Renal measurements: 10.0 x 5.1 x 4.7 cm = volume: 125 mL. Multiple cysts are noted. The largest of these lies in the lower pole of the left kidney medially. No obstructive changes are seen. Mild increased echogenicity is noted. Bladder: Appears normal for degree of bladder distention. Other: None. IMPRESSION: Left renal cysts. Increased echogenicity consistent with medical renal disease. Electronically Signed   By: Inez Catalina M.D.   On: 12/11/2019 20:45   DG Foot Complete Right  Result Date: 12/11/2019 CLINICAL DATA:  Right foot pain after a fall today. Initial encounter. EXAM: RIGHT FOOT COMPLETE - 3+ VIEW COMPARISON:  None. FINDINGS: Bones are osteopenic. No acute bony or joint abnormality is seen. Soft tissues are diffusely swollen. IMPRESSION: No acute bony abnormality. Diffuse soft tissue swelling. Osteopenia. Electronically Signed   By: Inge Rise M.D.   On: 12/11/2019 15:43   DG Hip Unilat W or Wo Pelvis 2-3 Views Right  Result Date: 12/11/2019 CLINICAL DATA:  Right hip pain after a fall today. Initial encounter. EXAM: DG HIP (WITH OR WITHOUT PELVIS) 2-3V RIGHT COMPARISON:  None. FINDINGS: The patient has an acute right intertrochanteric fracture with subtrochanteric extension. The lesser trochanter is a separate fragment. No other acute abnormality is identified. IMPRESSION: Acute right intertrochanteric fracture with subtrochanteric extension. Electronically Signed   By: Inge Rise M.D.   On: 12/11/2019 15:43   US Abdomen Limited RUQ  Result Date: 12/11/2019 CLINICAL DATA:  Elevated LFTs and bilirubin EXAM: ULTRASOUND ABDOMEN LIMITED RIGHT UPPER QUADRANT COMPARISON:  None. FINDINGS: Gallbladder: No gallstones or wall thickening visualized. No sonographic Murphy sign noted by  sonographer. Common bile duct: Diameter: 2.7 mm. Liver: 1.4 cm cyst in the left lobe of the liver is noted. No other focal hepatic abnormality is seen. Portal vein is patent on color Doppler imaging with normal direction of blood flow towards the liver. Other: None. IMPRESSION: Left hepatic cyst.  No other focal abnormality is seen. Electronically Signed   By: Inez Catalina M.D.   On: 12/11/2019 20:44    Scheduled Meds: . sodium chloride   Intravenous Once  . acetaminophen  1,000 mg Oral Once  . atorvastatin  40 mg Oral Daily  . chlorhexidine  60 mL Topical Once  . folic acid  1 mg Oral Daily  . gabapentin  100 mg Oral QHS  . heparin  5,000 Units Subcutaneous Q8H  . lisinopril  20 mg Oral Daily  . metoprolol tartrate  100 mg Oral BID  . multivitamin with minerals  1 tablet Oral Daily  . povidone-iodine  2 application Topical Once  Continuous Infusions: . sodium chloride 100 mL/hr at 12/11/19 1947  .  ceFAZolin (ANCEF) IV       LOS: 1 day    Time spent: [place], MD [facility] Pager [phone]  If 7PM-7AM, please contact night-coverage [email or web address] Password [email or web address] 12/12/2019, 10:59 AM

## 2019-12-12 NOTE — Care Management (Signed)
TOC consult acknowledged to assist with any HH/DME needs. Awaiting PT/OT eval for DCP recommendations and will continue to follow.  Midge Minium MSN, RN, NCM-BC, ACM-RN [phone]

## 2019-12-12 NOTE — Anesthesia Procedure Notes (Signed)
Procedure Name: Intubation Date/Time: 12/12/2019 12:52 PM Performed by: Harden Mo, CRNA Pre-anesthesia Checklist: Patient identified, Emergency Drugs available, Suction available and Patient being monitored Patient Re-evaluated:Patient Re-evaluated prior to induction Oxygen Delivery Method: Circle System Utilized Preoxygenation: Pre-oxygenation with 100% oxygen Induction Type: IV induction Ventilation: Mask ventilation without difficulty and Oral airway inserted - appropriate to patient size Laryngoscope Size: Sabra Heck and 2 Grade View: Grade I Tube type: Oral Tube size: 7.5 mm Number of attempts: 1 Airway Equipment and Method: Stylet and Oral airway Placement Confirmation: ETT inserted through vocal cords under direct vision,  positive ETCO2 and breath sounds checked- equal and bilateral Secured at: 23 cm Tube secured with: Tape Dental Injury: Teeth and Oropharynx as per pre-operative assessment

## 2019-12-12 NOTE — Interval H&P Note (Signed)
I participated in the care of this patient and agree with the above history, physical and evaluation. I performed a review of the history and a physical exam as detailed   Raysha Tilmon Daniel Georjean Toya MD  

## 2019-12-12 NOTE — Plan of Care (Signed)
  Problem: Education: Goal: Knowledge of General Education information will improve Description: Including pain rating scale, medication(s)/side effects and non-pharmacologic comfort measures Outcome: Progressing   Problem: Health Behavior/Discharge Planning: Goal: Ability to manage health-related needs will improve Outcome: Progressing   Problem: Clinical Measurements: Goal: Diagnostic test results will improve Outcome: Progressing Goal: Respiratory complications will improve Outcome: Progressing Goal: Cardiovascular complication will be avoided Outcome: Progressing   Problem: Coping: Goal: Level of anxiety will decrease Outcome: Progressing   Problem: Pain Managment: Goal: General experience of comfort will improve Outcome: Progressing   Problem: Safety: Goal: Ability to remain free from injury will improve Outcome: Progressing   Problem: Skin Integrity: Goal: Risk for impaired skin integrity will decrease Outcome: Progressing

## 2019-12-12 NOTE — Progress Notes (Signed)
Lower extremity venous has been completed.   Preliminary results in CV Proc.   Abram Sander 12/12/2019 4:22 PM

## 2019-12-12 NOTE — Transfer of Care (Signed)
Immediate Anesthesia Transfer of Care Note  Patient: Daniel [name]  Procedure(s) Performed: INTRAMEDULLARY (IM) NAIL FEMORAL (Right Leg Upper)  Patient Location: PACU  Anesthesia Type:General  Level of Consciousness: awake, alert  and oriented  Airway & Oxygen Therapy: Patient Spontanous Breathing  Post-op Assessment: Report given to RN and Post -op Vital signs reviewed and stable  Post vital signs: Reviewed and stable  Last Vitals:  Vitals Value Taken Time  BP 147/66 12/12/19 1358  Temp    Pulse 60 12/12/19 1402  Resp 9 12/12/19 1402  SpO2 100 % 12/12/19 1402  Vitals shown include unvalidated device data.  Last Pain:  Vitals:   12/12/19 1205  TempSrc: Oral  PainSc:       Patients Stated Pain Goal: 2 ([id number])  Complications: No apparent anesthesia complications

## 2019-12-12 NOTE — Plan of Care (Addendum)
  Problem: Skin Integrity: Goal: Risk for impaired skin integrity will decrease Outcome: Progressing   Problem: Clinical Measurements: Goal: Ability to maintain clinical measurements within normal limits will improve Outcome: Progressing   Problem: Clinical Measurements: Goal: Will remain free from infection Outcome: Progressing    Pt noted with a few open areas on penis. Cleansed  And barrier cream applied.  Noted R upper back with cyst like protrusion. Pt states " neither of the above hurt" only My right leg hurts." Noted bruising inside R inner thigh area.   Pt has slight weakness on the Right side, ambulates with a cane at home. Remained NPO for surgery scheduled with Dr. Mardelle Matte at 1643pm

## 2019-12-12 NOTE — Op Note (Signed)
DATE OF SURGERY:  12/12/2019  TIME: 12:29 PM  PATIENT NAME:  Daniel [name]  AGE: 74 y.o.  PRE-OPERATIVE DIAGNOSIS:  right hip fracture  POST-OPERATIVE DIAGNOSIS:  SAME  PROCEDURE:  INTRAMEDULLARY (IM) NAIL FEMORAL  SURGEON:  Renette Butters  ASSISTANT:  Roxan Hockey, PA-C, he was present and scrubbed throughout the case, critical for completion in a timely fashion, and for retraction, instrumentation, and closure.   OPERATIVE IMPLANTS: Stryker Gamma Nail  PREOPERATIVE INDICATIONS:  Daniel [name] is a 73 y.o. year old who fell and suffered a hip fracture. He was brought into the ER and then admitted and optimized and then elected for surgical intervention.    The risks benefits and alternatives were discussed with the patient including but not limited to the risks of nonoperative treatment, versus surgical intervention including infection, bleeding, nerve injury, malunion, nonunion, hardware prominence, hardware failure, need for hardware removal, blood clots, cardiopulmonary complications, morbidity, mortality, among others, and they were willing to proceed.    OPERATIVE PROCEDURE:  The patient was brought to the operating room and placed in the supine position. General anesthesia was administered. He was placed on the fracture table.  Closed reduction was performed under C-arm guidance. Time out was then performed after sterile prep and drape. He received preoperative antibiotics.  Incision was made proximal to the greater trochanter. A guidewire was placed in the appropriate position. Confirmation was made on AP and lateral views. The above-named nail was opened. I opened the proximal femur with a reamer. I then placed the nail by hand easily down. I did not need to ream the femur.  Once the nail was completely seated, I placed a guidepin into the femoral head into the center center position. I measured the length, and then reamed the lateral cortex and up into the head. I then  placed the lag screw. Slight compression was applied. Anatomic fixation achieved. Bone quality was mediocre.  I then secured the proximal interlocking bolt, and took off a half a turn, and then removed the instruments, and took final C-arm pictures AP and lateral the entire length of the leg.  I then used perfect circles technique to place a distal interlock screw.   Anatomic reconstruction was achieved, and the wounds were irrigated copiously and closed with Vicryl followed by staples and sterile gauze for the skin. The patient was awakened and returned to PACU in stable and satisfactory condition. There no complications and the patient tolerated the procedure well.  He will be weightbearing as tolerated, and will be on chemical px  for a period of four weeks after discharge.   Daniel [name], M.D.

## 2019-12-12 NOTE — Anesthesia Postprocedure Evaluation (Signed)
Anesthesia Post Note  Patient: Daniel [name]  Procedure(s) Performed: INTRAMEDULLARY (IM) NAIL FEMORAL (Right Leg Upper)     Patient location during evaluation: PACU Anesthesia Type: General Level of consciousness: awake and alert Pain management: pain level controlled Vital Signs Assessment: post-procedure vital signs reviewed and stable Respiratory status: spontaneous breathing, nonlabored ventilation, respiratory function stable and patient connected to nasal cannula oxygen Cardiovascular status: blood pressure returned to baseline and stable Postop Assessment: no apparent nausea or vomiting Anesthetic complications: no    Last Vitals:  Vitals:   12/12/19 1443 12/12/19 1448  BP: (!) 163/86 (!) 165/83  Pulse: 66 66  Resp: 14 14  Temp:  36.6 C  SpO2: 100% 100%    Last Pain:  Vitals:   12/12/19 1413  TempSrc:   PainSc: 0-No pain                 Barnet Glasgow

## 2019-12-12 NOTE — Progress Notes (Signed)
Orthopedic Tech Progress Note Patient Details:  Daniel [name] 1946-02-24 [id number]  Ortho Devices Type of Ortho Device: CAM walker Ortho Device/Splint Location: rle Ortho Device/Splint Interventions: Ordered, Application, Adjustment   Post Interventions Patient Tolerated: Well Instructions Provided: Care of device, Adjustment of device   Karolee Stamps 12/12/2019, 2:21 PM

## 2019-12-13 ENCOUNTER — Encounter: Payer: Self-pay | Admitting: *Deleted

## 2019-12-13 DIAGNOSIS — N183 Chronic kidney disease, stage 3 unspecified: Secondary | ICD-10-CM

## 2019-12-13 DIAGNOSIS — I129 Hypertensive chronic kidney disease with stage 1 through stage 4 chronic kidney disease, or unspecified chronic kidney disease: Secondary | ICD-10-CM

## 2019-12-13 DIAGNOSIS — E785 Hyperlipidemia, unspecified: Secondary | ICD-10-CM

## 2019-12-13 LAB — BPAM RBC
Blood Product Expiration Date: 202101262359
Blood Product Expiration Date: 202101312359
ISSUE DATE / TIME: 202101051127
ISSUE DATE / TIME: 202101051254
Unit Type and Rh: 7300
Unit Type and Rh: 7300

## 2019-12-13 LAB — CBC
HCT: 25.8 % — ABNORMAL LOW (ref 39.0–52.0)
Hemoglobin: 8.5 g/dL — ABNORMAL LOW (ref 13.0–17.0)
MCH: 29.2 pg (ref 26.0–34.0)
MCHC: 32.9 g/dL (ref 30.0–36.0)
MCV: 88.7 fL (ref 80.0–100.0)
Platelets: 173 10*3/uL (ref 150–400)
RBC: 2.91 MIL/uL — ABNORMAL LOW (ref 4.22–5.81)
RDW: 17.2 % — ABNORMAL HIGH (ref 11.5–15.5)
WBC: 9.1 10*3/uL (ref 4.0–10.5)
nRBC: 0 % (ref 0.0–0.2)

## 2019-12-13 LAB — TYPE AND SCREEN
ABO/RH(D): B POS
Antibody Screen: NEGATIVE
Unit division: 0
Unit division: 0

## 2019-12-13 LAB — COMPREHENSIVE METABOLIC PANEL
ALT: 35 U/L (ref 0–44)
AST: 69 U/L — ABNORMAL HIGH (ref 15–41)
Albumin: 2.6 g/dL — ABNORMAL LOW (ref 3.5–5.0)
Alkaline Phosphatase: 43 U/L (ref 38–126)
Anion gap: 11 (ref 5–15)
BUN: 39 mg/dL — ABNORMAL HIGH (ref 8–23)
CO2: 25 mmol/L (ref 22–32)
Calcium: 7.5 mg/dL — ABNORMAL LOW (ref 8.9–10.3)
Chloride: 105 mmol/L (ref 98–111)
Creatinine, Ser: 2.5 mg/dL — ABNORMAL HIGH (ref 0.61–1.24)
GFR calc Af Amer: 28 mL/min — ABNORMAL LOW (ref >60)
GFR calc non Af Amer: 24 mL/min — ABNORMAL LOW (ref >60)
Glucose, Bld: 107 mg/dL — ABNORMAL HIGH (ref 70–99)
Potassium: 3.4 mmol/L — ABNORMAL LOW (ref 3.5–5.1)
Sodium: 141 mmol/L (ref 135–145)
Total Bilirubin: 0.7 mg/dL (ref 0.3–1.2)
Total Protein: 5.6 g/dL — ABNORMAL LOW (ref 6.5–8.1)

## 2019-12-13 MED ORDER — POTASSIUM CHLORIDE CRYS ER 20 MEQ PO TBCR
20.0000 meq | EXTENDED_RELEASE_TABLET | Freq: Once | ORAL | Status: AC
  Administered 2019-12-13: 15:00:00 20 meq via ORAL
  Filled 2019-12-13: qty 1

## 2019-12-13 NOTE — Progress Notes (Signed)
Daniel [name]  PROGRESS NOTE    Daniel [name]  [id number] DOB: September 01, 1946 DOA: 12/11/2019 PCP: Vivi Barrack, MD   Brief Narrative:   Daniel [name] a 74 y.o.malewith medical history significant ofHTN, CVA with residual right hemiparesis in 2010 on Plavix, schizophrenia, and psychosis.Patient was just mumbling most times when I talked to him,also have history obtained from patient history and ED staff.according to sister [name], whom he lives withpatient baseline usesa caneto ambulate. Hefell down2 days ago, trying to get out of bed,and the patient stayed on the floor for 2 hours before he coulduse some help from neighbor.Since then patient refused to get out of bed and walk again,continue to complain about the right leg pain and " all locked up of right hip". He tried to get up and put weight on his right leg again today and fell a second time. [name] called EMS. She states that the leg has been swollen for "a while now" but seemed more swollen today.riage nurse, EMS came to his home and found him to be clammy, fatigued, and lethargic and brought him to the ED. When patient was questioned, he states that he "just hit the floor" and "maybe he was mopping the floor". When asked other questions, he became somnolent and did not answer.   12/13/19: Hgb is at 8.5 this AM, no signs of frank bleed, monitor. Needs PT/OT assessment   Assessment & Plan:   Active Problems:   Anemia   Hyperlipidemia   Benign hypertension with CKD (chronic kidney disease) stage III   Schizophrenia (HCC)   Closed displaced intertrochanteric fracture of right femur (HCC)   LFT elevation   AKI (acute kidney injury) ([place])  Right closed intertrochanteric fracture right femur     - s/p R femoral IMN     - PT/OT per orthopedic recommendations     - pain control with oxycodone and gabapentin     - senokot-docusate   AKI     - admission SCr 3.22 trending down to 2.50 today     - continue fluids     - watch  nephrotoxins     - has CKD w/ an unknown baseline, can not be more specific that CKD 3 - 4; need outside records  Normocytic Anemia Iron deficiency     - Likely some dilutional component 8.7 on admit to 7.1 12/12/19     - s/p 2 units PRBC prior to OR after consult with orthopedics     - anemia panel 12/11/19 iron 28, ferritin 131, B12 754; supplement iron at discharge     - Hgb is 8.5 this AM, monitor     - no signs of frank bleed  Elevated LFTs     - could be from acute trauma     - continues to trend down     - exam normal, continue to monitor  HTN     - continue lisinopril, metoprolol     - titrate as necessary  Hyperlipidemia     - continue home lipitor; monitor LFTs  Mild Hypokalemia     - he has been stable at 3.4 for the last 2 days (normal 3.5)     - check Mg2+     - add OT KCl  DVT prophylaxis: lovenox Code Status: FULL Family Communication: None at bedside   Disposition Plan: TBD  Consultants:   Orthopedics  Procedures:   R femur IMN  ROS:  Denies CP, N, V, dyspnea, HA . Remainder  10-pt ROS is negative for all not previously mentioned.  Subjective: "When are they bringing breakfast?"  Objective: Vitals:   12/12/19 2043 12/13/19 0023 12/13/19 0435 12/13/19 0749  BP: (!) 144/66 139/72 (!) 144/73 (!) 153/71  Pulse: 72 70 70 78  Resp: 19 16 15 16   Temp: 98.9 F (37.2 C) 98.9 F (37.2 C) 99.3 F (37.4 C) 99.3 F (37.4 C)  TempSrc: Oral Oral Oral Oral  SpO2: 100% 99% 98% 96%  Weight:      Height:        Intake/Output Summary (Last 24 hours) at 12/13/2019 1328 Last data filed at 12/13/2019 0900 Gross per 24 hour  Intake 2690 ml  Output 1150 ml  Net 1540 ml   Filed Weights   12/11/19 2119  Weight: 67.5 kg    Examination:  General: 74 y.o. male resting in bed in chair Cardiovascular: RRR, +S1, S2, no m/g/r, equal pulses throughout Respiratory: CTABL, no w/r/r, normal WOB GI: BS+, NDNT, no masses noted, no organomegaly noted MSK: No  e/c/c Neuro: A&O x 3, no focal deficits Psyc: calm/cooperative   Data Reviewed: I have personally reviewed following labs and imaging studies.  CBC: Recent Labs  Lab 12/11/19 1410 12/12/19 0009 12/12/19 1941 12/13/19 0448  WBC 13.4* 10.2  --  9.1  NEUTROABS 10.9*  --   --   --   HGB 8.7* 7.1* 9.7* 8.5*  HCT 26.2* 20.8* 28.9* 25.8*  MCV 92.3 90.8  --  88.7  PLT 211 195  --  [id number]   Basic Metabolic Panel: Recent Labs  Lab 12/11/19 1410 12/12/19 0009 12/13/19 0448  NA 140 140 141  K 3.8 3.4* 3.4*  CL 104 104 105  CO2 23 24 25   GLUCOSE 106* 122* 107*  BUN 55* 51* 39*  CREATININE 3.22* 2.93* 2.50*  CALCIUM 8.8* 7.9* 7.5*   GFR: Estimated Creatinine Clearance: 24.8 mL/min (A) (by C-G formula based on SCr of 2.5 mg/dL (H)). Liver Function Tests: Recent Labs  Lab 12/11/19 1410 12/12/19 0009 12/13/19 0448  AST 168* 121* 69*  ALT 73* 57* 35  ALKPHOS 60 47 43  BILITOT 1.7* 1.2 0.7  PROT 7.7 6.0* 5.6*  ALBUMIN 4.0 3.2* 2.6*   No results for input(s): LIPASE, AMYLASE in the last 168 hours. Recent Labs  Lab 12/12/19 0009  AMMONIA 40*   Coagulation Profile: No results for input(s): INR, PROTIME in the last 168 hours. Cardiac Enzymes: No results for input(s): CKTOTAL, CKMB, CKMBINDEX, TROPONINI in the last 168 hours. BNP (last 3 results) No results for input(s): PROBNP in the last 8760 hours. HbA1C: No results for input(s): HGBA1C in the last 72 hours. CBG: No results for input(s): GLUCAP in the last 168 hours. Lipid Profile: No results for input(s): CHOL, HDL, LDLCALC, TRIG, CHOLHDL, LDLDIRECT in the last 72 hours. Thyroid Function Tests: No results for input(s): TSH, T4TOTAL, FREET4, T3FREE, THYROIDAB in the last 72 hours. Anemia Panel: Recent Labs    12/12/19 0009  VITAMINB12 754  FERRITIN 131  TIBC 231*  IRON 28*  RETICCTPCT 3.4*   Sepsis Labs: No results for input(s): PROCALCITON, LATICACIDVEN in the last 168 hours.  Recent Results (from the past  240 hour(s))  Surgical pcr screen     Status: Abnormal   Collection Time: 12/11/19  9:24 PM   Specimen: Nasal Mucosa; Nasal Swab  Result Value Ref Range Status   MRSA, PCR NEGATIVE NEGATIVE Final   Staphylococcus aureus POSITIVE (A) NEGATIVE Final    Comment: (  NOTE) The Xpert SA Assay (FDA approved for NASAL specimens in patients 18 years of age and older), is one component of a comprehensive surveillance program. It is not intended to diagnose infection nor to guide or monitor treatment. Performed at [facility], [place] [address], [place], [place] [address]   SARS CORONAVIRUS 2 (TAT 6-24 HRS) Nasopharyngeal Nasopharyngeal Swab     Status: None   Collection Time: 12/11/19  9:45 PM   Specimen: Nasopharyngeal Swab  Result Value Ref Range Status   SARS Coronavirus 2 NEGATIVE NEGATIVE Final    Comment: (NOTE) SARS-CoV-2 target nucleic acids are NOT DETECTED. The SARS-CoV-2 RNA is generally detectable in upper and lower respiratory specimens during the acute phase of infection. Negative results do not preclude SARS-CoV-2 infection, do not rule out co-infections with other pathogens, and should not be used as the sole basis for treatment or other patient management decisions. Negative results must be combined with clinical observations, patient history, and epidemiological information. The expected result is Negative. Fact Sheet for Patients: SugarRoll.be Fact Sheet for Healthcare Providers: https://www.woods-mathews.com/ This test is not yet approved or cleared by the [place] FDA and  has been authorized for detection and/or diagnosis of SARS-CoV-2 by FDA under an Emergency Use Authorization (EUA). This EUA will remain  in effect (meaning this test can be used) for the duration of the COVID-19 declaration under Section 56 4(b)(1) of the Act, 21 U.S.C. section 360bbb-3(b)(1), unless the authorization is terminated or revoked  sooner. Performed at [facility], [place] [address], [place], [place] [address]       Radiology Studies: DG Chest 1 View  Result Date: 12/11/2019 CLINICAL DATA:  Pain status post fall EXAM: CHEST  1 VIEW COMPARISON:  07/30/2009 FINDINGS: The heart size and mediastinal contours are within normal limits. Both lungs are clear. The visualized skeletal structures are unremarkable. IMPRESSION: No active disease. Electronically Signed   By: Constance Holster M.D.   On: 12/11/2019 15:42   DG Knee 2 Views Right  Result Date: 12/11/2019 CLINICAL DATA:  Right knee pain after fall today. Initial encounter. EXAM: RIGHT KNEE - 1-2 VIEW COMPARISON:  None. FINDINGS: There is no acute bony or joint abnormality. No joint effusion. Soft tissues anterior to the patella appear mildly swollen which may be due to contusion. IMPRESSION: Negative for fracture. Soft tissue swelling anterior to the patella could be due to contusion. Electronically Signed   By: Inge Rise M.D.   On: 12/11/2019 15:44   DG Ankle Complete Right  Result Date: 12/11/2019 CLINICAL DATA:  Right ankle pain after a fall today. Initial encounter. EXAM: RIGHT ANKLE - COMPLETE 3+ VIEW COMPARISON:  None. FINDINGS: There is soft tissue swelling about the ankle. The patient has a nondisplaced and incomplete appearing fracture of the distal diaphysis of the fibula. No other acute bony abnormality is seen. IMPRESSION: Nondisplaced and incomplete fracture of the distal diaphysis of the fibula. Soft tissue swelling. Electronically Signed   By: Inge Rise M.D.   On: 12/11/2019 15:42   CT Head Wo Contrast  Result Date: 12/11/2019 CLINICAL DATA:  Neuro deficit(s), subacute. Additional history provided: Patient presents after fall at home, clammy and lethargic, had fallen a few days ago EXAM: CT HEAD WITHOUT CONTRAST TECHNIQUE: Contiguous axial images were obtained from the base of the skull through the vertex without intravenous contrast.  COMPARISON:  MRI/MRA head 07/31/2009, head CT 07/30/2009 FINDINGS: Brain: Again demonstrated are now chronic cortical/subcortical infarcts involving the left  anterior cerebral artery vascular territory as well as left anterior cerebral artery/middle cerebral artery vascular watershed territory. These infarcts involve the left frontal lobe, left parietooccipital lobes and posterior left temporal lobe. No acute infarct is identified. No evidence of acute intracranial hemorrhage. No midline shift or extra-axial fluid collection. No evidence of intracranial mass. Ill-defined hypoattenuation within the cerebral white matter is nonspecific, but consistent with chronic small vessel ischemic disease. Mild generalized parenchymal atrophy. Vascular: No hyperdense vessel.  Atherosclerotic calcifications. Skull: No calvarial fracture. New from prior examination in 2010, there is a heterogeneous, sclerotic and ground-glass appearance within portions of the left sphenoid wing. This is indeterminate. Sinuses/Orbits: Visualized orbits demonstrate no acute abnormality. Mild scattered paranasal sinus mucosal thickening greatest within the bilateral ethmoid air cells. No significant mastoid effusion. IMPRESSION: No CT evidence of acute intracranial abnormality. Redemonstrated now chronic cortical/subcortical infarcts within the left ACA territory and ACA/MCA watershed territory as described. Generalized parenchymal atrophy and chronic small vessel ischemic disease. New from prior examination in 2010, there is a heterogeneous, sclerotic and ground-glass appearance within portions of the left sphenoid wing. This is nonspecific and of unclear clinical significance. Nonemergent contrast-enhanced brain MRI is recommended for further evaluation, as clinically warranted. Electronically Signed   By: Kellie Simmering DO   On: 12/11/2019 16:59   US RENAL  Result Date: 12/11/2019 CLINICAL DATA:  Hypertension EXAM: RENAL / URINARY TRACT ULTRASOUND  COMPLETE COMPARISON:  None. FINDINGS: Right Kidney: Renal measurements: 9.4 x 4.2 x 4.3 cm. = volume: 89 mL. Slight increased echogenicity is noted. No hydronephrosis is seen. Left Kidney: Renal measurements: 10.0 x 5.1 x 4.7 cm = volume: 125 mL. Multiple cysts are noted. The largest of these lies in the lower pole of the left kidney medially. No obstructive changes are seen. Mild increased echogenicity is noted. Bladder: Appears normal for degree of bladder distention. Other: None. IMPRESSION: Left renal cysts. Increased echogenicity consistent with medical renal disease. Electronically Signed   By: Inez Catalina M.D.   On: 12/11/2019 20:45   DG Foot Complete Right  Result Date: 12/11/2019 CLINICAL DATA:  Right foot pain after a fall today. Initial encounter. EXAM: RIGHT FOOT COMPLETE - 3+ VIEW COMPARISON:  None. FINDINGS: Bones are osteopenic. No acute bony or joint abnormality is seen. Soft tissues are diffusely swollen. IMPRESSION: No acute bony abnormality. Diffuse soft tissue swelling. Osteopenia. Electronically Signed   By: Inge Rise M.D.   On: 12/11/2019 15:43   DG C-Arm 1-60 Min  Result Date: 12/12/2019 CLINICAL DATA:  Right intertrochanteric femur fracture ORIF. EXAM: RIGHT FEMUR 2 VIEWS; DG C-ARM 1-60 MIN COMPARISON:  Right hip x-rays from yesterday. FLUOROSCOPY TIME:  57 seconds. C-arm fluoroscopic images were obtained intraoperatively and submitted for post operative interpretation. FINDINGS: Multiple intraoperative fluoroscopic images demonstrate interval cephalomedullary nail fixation of the right intertrochanteric femur fracture with improved alignment of the dominant fracture fragments. The lesser trochanter fragment remains displaced. IMPRESSION: Intraoperative fluoroscopic guidance for right intertrochanteric femur fracture ORIF. Electronically Signed   By: Titus Dubin M.D.   On: 12/12/2019 13:45   DG Hip Unilat W or Wo Pelvis 2-3 Views Right  Result Date: 12/11/2019 CLINICAL  DATA:  Right hip pain after a fall today. Initial encounter. EXAM: DG HIP (WITH OR WITHOUT PELVIS) 2-3V RIGHT COMPARISON:  None. FINDINGS: The patient has an acute right intertrochanteric fracture with subtrochanteric extension. The lesser trochanter is a separate fragment. No other acute abnormality is identified. IMPRESSION: Acute right intertrochanteric fracture with subtrochanteric extension. Electronically  Signed   By: Inge Rise M.D.   On: 12/11/2019 15:43   DG FEMUR, MIN 2 VIEWS RIGHT  Result Date: 12/12/2019 CLINICAL DATA:  Right intertrochanteric femur fracture ORIF. EXAM: RIGHT FEMUR 2 VIEWS; DG C-ARM 1-60 MIN COMPARISON:  Right hip x-rays from yesterday. FLUOROSCOPY TIME:  57 seconds. C-arm fluoroscopic images were obtained intraoperatively and submitted for post operative interpretation. FINDINGS: Multiple intraoperative fluoroscopic images demonstrate interval cephalomedullary nail fixation of the right intertrochanteric femur fracture with improved alignment of the dominant fracture fragments. The lesser trochanter fragment remains displaced. IMPRESSION: Intraoperative fluoroscopic guidance for right intertrochanteric femur fracture ORIF. Electronically Signed   By: Titus Dubin M.D.   On: 12/12/2019 13:45   VAS [place] LOWER EXTREMITY VENOUS (DVT)  Result Date: 12/12/2019  Lower Venous Study Indications: Pain.  Limitations: Patient positioning. Performing Technologist: Abram Sander RVS  Examination Guidelines: A complete evaluation includes B-mode imaging, spectral Doppler, color Doppler, and power Doppler as needed of all accessible portions of each vessel. Bilateral testing is considered an integral part of a complete examination. Limited examinations for reoccurring indications may be performed as noted.  +---------+---------------+---------+-----------+----------+--------------+ RIGHT    CompressibilityPhasicitySpontaneityPropertiesThrombus Aging  +---------+---------------+---------+-----------+----------+--------------+ CFV      Full           Yes      Yes                                 +---------+---------------+---------+-----------+----------+--------------+ SFJ      Full                                                        +---------+---------------+---------+-----------+----------+--------------+ FV Prox  Full                                                        +---------+---------------+---------+-----------+----------+--------------+ FV Mid                  Yes      Yes                                 +---------+---------------+---------+-----------+----------+--------------+ FV Distal               Yes      Yes                                 +---------+---------------+---------+-----------+----------+--------------+ PFV      Full                                                        +---------+---------------+---------+-----------+----------+--------------+ POP                     Yes      Yes                                 +---------+---------------+---------+-----------+----------+--------------+  PTV      Full                                                        +---------+---------------+---------+-----------+----------+--------------+ PERO                                                  Not visualized +---------+---------------+---------+-----------+----------+--------------+   +---------+---------------+---------+-----------+----------+--------------+ LEFT     CompressibilityPhasicitySpontaneityPropertiesThrombus Aging +---------+---------------+---------+-----------+----------+--------------+ CFV      Full           Yes      Yes                                 +---------+---------------+---------+-----------+----------+--------------+ SFJ      Full                                                         +---------+---------------+---------+-----------+----------+--------------+ FV Prox  Full                                                        +---------+---------------+---------+-----------+----------+--------------+ FV Mid   Full                                                        +---------+---------------+---------+-----------+----------+--------------+ FV DistalFull                                                        +---------+---------------+---------+-----------+----------+--------------+ PFV      Full                                                        +---------+---------------+---------+-----------+----------+--------------+ POP      Full           Yes      Yes                                 +---------+---------------+---------+-----------+----------+--------------+ PTV      Full                                                        +---------+---------------+---------+-----------+----------+--------------+  PERO     Full                                                        +---------+---------------+---------+-----------+----------+--------------+     Summary: Right: There is no evidence of deep vein thrombosis in the lower extremity. However, portions of this examination were limited- see technologist comments above. No cystic structure found in the popliteal fossa. Left: There is no evidence of deep vein thrombosis in the lower extremity. No cystic structure found in the popliteal fossa.  *See table(s) above for measurements and observations. Electronically signed by Harold Barban MD on 12/12/2019 at 7:39:15 PM.    Final    US Abdomen Limited RUQ  Result Date: 12/11/2019 CLINICAL DATA:  Elevated LFTs and bilirubin EXAM: ULTRASOUND ABDOMEN LIMITED RIGHT UPPER QUADRANT COMPARISON:  None. FINDINGS: Gallbladder: No gallstones or wall thickening visualized. No sonographic Murphy sign noted by sonographer. Common bile duct: Diameter: 2.7 mm.  Liver: 1.4 cm cyst in the left lobe of the liver is noted. No other focal hepatic abnormality is seen. Portal vein is patent on color Doppler imaging with normal direction of blood flow towards the liver. Other: None. IMPRESSION: Left hepatic cyst.  No other focal abnormality is seen. Electronically Signed   By: Inez Catalina M.D.   On: 12/11/2019 20:44     Scheduled Meds: . sodium chloride   Intravenous Once  . atorvastatin  40 mg Oral Daily  . Chlorhexidine Gluconate Cloth  6 each Topical Daily  . enoxaparin (LOVENOX) injection  30 mg Subcutaneous Q24H  . folic acid  1 mg Oral Daily  . gabapentin  100 mg Oral QHS  . lisinopril  20 mg Oral Daily  . metoprolol tartrate  100 mg Oral BID  . multivitamin with minerals  1 tablet Oral Daily  . mupirocin ointment  1 application Nasal BID   Continuous Infusions: . sodium chloride 250 mL (12/12/19 1146)  . lactated ringers 100 mL/hr at 12/13/19 0442     LOS: 2 days    Time spent: 25 minutes spent in the coordination of care today   Jonnie Finner, DO [facility]  If 7PM-7AM, please contact night-coverage [email or web address] 12/13/2019, 1:28 PM

## 2019-12-13 NOTE — Evaluation (Signed)
Physical Therapy Evaluation Patient Details Name: Daniel [name] MRN: [id number] DOB: 1946/12/05 Today's Date: 12/13/2019   History of Present Illness  74 yo s/p IM nail r femoral on 1/5 and R non displaced ankle fx after fall at home. PMH HTN CVA with R hemiparesis 2010, schizophrenia and psychosis  Clinical Impression  Pt in bed upon entry of PT/OT, agreeable to session at this time. The pt presents with significant deficits in mobility and independence due to above dx as well as generalized weakness and debility. The pt requires significant assist to perform all bed mobility, and required use of a stedy to perform a pivot transfer to the recliner. He was able to perform a sit-to-stand transfer with a RW, but was limited in any further mobility or ambulation due to significant weakness and pain. The pt will continue to benefit from skilled PT in hospital as well as prior to d/c home as he currently needs more assistance than his sister is able to provide.     Follow Up Recommendations SNF;Supervision/Assistance - 24 hour    Equipment Recommendations  None recommended by PT    Recommendations for Other Services       Precautions / Restrictions Precautions Precautions: Fall Required Braces or Orthoses: Other Brace Other Brace: Cam boot RLE Restrictions Weight Bearing Restrictions: Yes RLE Weight Bearing: Weight bearing as tolerated      Mobility  Bed Mobility Overal bed mobility: Needs Assistance Bed Mobility: Supine to Sit     Supine to sit: Mod assist;+2 for physical assistance;HOB elevated     General bed mobility comments: with able to follow instructions for  initiation of movements of BLE and BUE, but needs extra time and assit to complete movement as well as modA to moove pads to position hips at EOB. Pt required multiple cues for posture sitting EOB to correct strong posterior lean.  Transfers Overall transfer level: Needs assistance Equipment used: Ambulation  equipment used Transfers: Sit to/from [facility] Sit to Stand: Mod assist;Max assist;+2 physical assistance Stand pivot transfers: Total assist;+2 physical assistance       General transfer comment: pt attempted sit-stand with RW and mod/maxA of 2, but was unable to take any steps and had to return to sitting due to knee buckling (unsure which leg buckled first). Pt then used stedy and required modA to rise with significant reliance on BUE.  Ambulation/Gait             General Gait Details: unable  Stairs            Wheelchair Mobility    Modified Rankin (Stroke Patients Only) Modified Rankin (Stroke Patients Only) Pre-Morbid Rankin Score: (unknown, stroke occured 10 years ago) Modified Rankin: Moderately severe disability     Balance Overall balance assessment: Needs assistance Sitting-balance support: Bilateral upper extremity supported;Feet supported Sitting balance-Leahy Scale: Fair Sitting balance - Comments: pt needs modA initially, supervision with UE support after positioning Postural control: Posterior lean   Standing balance-Leahy Scale: Poor Standing balance comment: significant reliance on BUE, due to weakness                             Pertinent Vitals/Pain Pain Assessment: Faces Faces Pain Scale: Hurts even more(when asked to scale pain 0-10 pt reports "oh pain? I got a lot of that") Pain Location: L leg, R foot Pain Descriptors / Indicators: Sore;Grimacing;Discomfort Pain Intervention(s): Limited activity within patient's tolerance;Monitored during session;Repositioned  Home Living Family/patient expects to be discharged to:: Private residence Living Arrangements: Other relatives(sister) Available Help at Discharge: Family;Available 24 hours/day Type of Home: House Home Access: Stairs to enter Entrance Stairs-Rails: Can reach both Entrance Stairs-Number of Steps: 3-4 Home Layout: One level Home Equipment:  Walker - 2 wheels;Tub bench;Grab bars - tub/shower Additional Comments: chart mentions that pt uses w/c at home with sister, but pt did not mention having or using W/C    Prior Function Level of Independence: Needs assistance   Gait / Transfers Assistance Needed: pt mentions ambulating with "cane" but then indicates RW in the room as looking like the "cane" he uses at home. Pt with significant LLE weakness, therefore it is most likely he required assist or supervision prior to admission.  ADL's / Homemaking Assistance Needed: pt reports his sister does the cooking and cleaning, but he is able to perform ADLs        Hand Dominance   Dominant Hand: Right    Extremity/Trunk Assessment   Upper Extremity Assessment Upper Extremity Assessment: Defer to OT evaluation    Lower Extremity Assessment Lower Extremity Assessment: Generalized weakness;RLE deficits/detail;LLE deficits/detail RLE Deficits / Details: pt in cam boot, good functional use despite pain RLE: Unable to fully assess due to immobilization LLE Deficits / Details: significant LE weakness, difficulty placing LLE for stand, poor functional use. Sig internal rotation at rest and upon standing. pt also complains of L foot pain throughout session    Cervical / Trunk Assessment Cervical / Trunk Assessment: Kyphotic  Communication   Communication: No difficulties  Cognition Arousal/Alertness: Awake/alert Behavior During Therapy: Flat affect;WFL for tasks assessed/performed Overall Cognitive Status: Within Functional Limits for tasks assessed Area of Impairment: Safety/judgement;Problem solving                         Safety/Judgement: Decreased awareness of safety;Decreased awareness of deficits   Problem Solving: Slow processing;Decreased initiation;Requires verbal cues General Comments: Pt with overall functional behavior and cognition. He appears to be reliable historian, but unable to validate. Pt demos slowed  processing and benefits from increased time for mobility and command following. Very sweet demeanor      General Comments General comments (skin integrity, edema, etc.): despite residual hemiparesis of R side from CVA in 2010, pt L side seems physically weaker    Exercises     Assessment/Plan    PT Assessment Patient needs continued PT services  PT Problem List Decreased strength;Decreased mobility;Decreased safety awareness;Decreased range of motion;Decreased coordination;Decreased activity tolerance;Cardiopulmonary status limiting activity;Decreased balance;Pain       PT Treatment Interventions DME instruction;Therapeutic exercise;Gait training;Balance training;Stair training;Functional mobility training;Therapeutic activities;Patient/family education    PT Goals (Current goals can be found in the Care Plan section)  Acute Rehab PT Goals Patient Stated Goal: to return home PT Goal Formulation: With patient Time For Goal Achievement: 12/27/19 Potential to Achieve Goals: Fair    Frequency Min 3X/week   Barriers to discharge   pt lives at home with sister, currently he needs more assist than she can provide    Co-evaluation PT/OT/SLP Co-Evaluation/Treatment: Yes Reason for Co-Treatment: Complexity of the patient's impairments (multi-system involvement);For patient/therapist safety;To address functional/ADL transfers PT goals addressed during session: Mobility/safety with mobility;Balance;Proper use of DME;Strengthening/ROM         AM-PAC PT "6 Clicks" Mobility  Outcome Measure Help needed turning from your back to your side while in a flat bed without using bedrails?: A Lot Help needed moving  from lying on your back to sitting on the side of a flat bed without using bedrails?: A Lot Help needed moving to and from a bed to a chair (including a wheelchair)?: Total Help needed standing up from a chair using your arms (e.g., wheelchair or bedside chair)?: A Lot Help needed to  walk in hospital room?: Total Help needed climbing 3-5 steps with a railing? : Total 6 Click Score: 9    End of Session Equipment Utilized During Treatment: Gait belt;Other (comment)(cam boot RLE) Activity Tolerance: Patient tolerated treatment well;Patient limited by pain;Patient limited by fatigue Patient left: in chair;with chair alarm set;with call bell/phone within reach;with nursing/sitter in room Nurse Communication: Mobility status;Need for lift equipment(stedy) PT Visit Diagnosis: Unsteadiness on feet (R26.81);Difficulty in walking, not elsewhere classified (R26.2);Muscle weakness (generalized) (M62.81);Hemiplegia and hemiparesis;Pain Hemiplegia - Right/Left: Right Hemiplegia - dominant/non-dominant: Dominant Hemiplegia - caused by: Unspecified Pain - Right/Left: Right Pain - part of body: Leg    Time: [id number] PT Time Calculation (min) (ACUTE ONLY): 37 min   Charges:   PT Evaluation $PT Eval Moderate Complexity: 1 Mod          Karma Ganja, PT, DPT   Acute Rehabilitation Department [phone]  Otho Bellows 12/13/2019, 3:46 PM

## 2019-12-13 NOTE — Evaluation (Signed)
Occupational Therapy Evaluation Patient Details Name: Daniel [name] MRN: [id number] DOB: 1946/10/29 Today's Date: 12/13/2019    History of Present Illness 74 yo s/p IM nail r femoral on 1/5 and R non displaced ankle fx after fall at home. PMH HTN CVA with R hemiparesis 2010, schizophrenia and psychosis   Clinical Impression   Pt PTA: pt reports independence with ADL and mobility; sister cooks/cleans. Pt currently, limited by pain in RLE hip and LLE foot, decreased ability to care for self and decreased processing skills to follow commands safely. Pt modA to maxA +2 with RW and unable to fully stand without knees buckling. Use of stedy modA +2 for safest transfer. Pt set-up A for grooming in chair; otherwise, pt is maxA for LB ADL. Pt does not have physical assist at home to care for pt. Pt would greatly benefit from SNF level therapy to increase independence in ADL and mobility. OT following acutely.    Follow Up Recommendations  SNF    Equipment Recommendations  Other (comment)(to be determined)    Recommendations for Other Services       Precautions / Restrictions Precautions Precautions: Fall Required Braces or Orthoses: Other Brace Other Brace: Cam boot RLE Restrictions Weight Bearing Restrictions: Yes RLE Weight Bearing: Weight bearing as tolerated      Mobility Bed Mobility Overal bed mobility: Needs Assistance Bed Mobility: Supine to Sit     Supine to sit: Mod assist;+2 for physical assistance;HOB elevated     General bed mobility comments: Pt requiring assist for BLE management and trunk elevation; tactile cues for LUE reaching for rail  Transfers Overall transfer level: Needs assistance Equipment used: Ambulation equipment used Transfers: Sit to/from [facility] Sit to Stand: Mod assist;Max assist;+2 physical assistance Stand pivot transfers: Total assist;+2 physical assistance       General transfer comment: pt attempted sit-stand with RW  and mod/maxA of 2, but was unable to take any steps and had to return to sitting due to knee buckling (unsure which leg buckled first). Pt then used stedy and required modA to rise with significant reliance on BUE.    Balance Overall balance assessment: Needs assistance Sitting-balance support: Bilateral upper extremity supported;Feet supported Sitting balance-Leahy Scale: Fair Sitting balance - Comments: pt needs modA initially, supervision with UE support after positioning Postural control: Posterior lean   Standing balance-Leahy Scale: Poor Standing balance comment: significant reliance on BUE, due to weakness                           ADL either performed or assessed with clinical judgement   ADL Overall ADL's : Needs assistance/impaired Eating/Feeding: Set up;Sitting   Grooming: Set up;Sitting   Upper Body Bathing: Set up;Sitting   Lower Body Bathing: Moderate assistance;+2 for physical assistance;+2 for safety/equipment;Sitting/lateral leans;Sit to/from stand;Cueing for safety;Maximal assistance   Upper Body Dressing : Set up;Sitting   Lower Body Dressing: Moderate assistance;Maximal assistance;+2 for physical assistance;+2 for safety/equipment;Sitting/lateral leans;Sit to/from stand;Cueing for safety;Cueing for sequencing   Toilet Transfer: Moderate assistance;Maximal assistance;+2 for physical assistance;+2 for safety/equipment;Cueing for safety;Cueing for sequencing;Stand-pivot;BSC   Toileting- Clothing Manipulation and Hygiene: Moderate assistance;Maximal assistance;+2 for physical assistance;+2 for safety/equipment;Sitting/lateral lean;Sit to/from stand;Cueing for safety;Cueing for sequencing       Functional mobility during ADLs: Moderate assistance;Maximal assistance;+2 for physical assistance;+2 for safety/equipment;Cueing for safety;Cueing for sequencing General ADL Comments: Pt limited by pain in RLE hip and LLE foot, decreased ability to care for self  and decreased processing skills to follow commands safely.     Vision Baseline Vision/History: Wears glasses Wears Glasses: At all times Patient Visual Report: No change from baseline Vision Assessment?: No apparent visual deficits     Perception     Praxis      Pertinent Vitals/Pain Pain Assessment: 0-10 Pain Score: 5  Faces Pain Scale: Hurts even more(when asked to scale pain 0-10 pt reports "oh pain? I got a lot of that") Pain Location: L leg, R foot Pain Descriptors / Indicators: Sore;Grimacing;Discomfort Pain Intervention(s): Limited activity within patient's tolerance;Monitored during session     Hand Dominance Right   Extremity/Trunk Assessment Upper Extremity Assessment Upper Extremity Assessment: Generalized weakness   Lower Extremity Assessment Lower Extremity Assessment: Generalized weakness RLE Deficits / Details: RLE affected; cam boot RLE: Unable to fully assess due to immobilization LLE Deficits / Details: significant LE weakness, difficulty placing LLE for stand, poor functional use. Sig internal rotation at rest and upon standing. pt also complains of L foot pain throughout session   Cervical / Trunk Assessment Cervical / Trunk Assessment: Kyphotic   Communication Communication Communication: No difficulties   Cognition Arousal/Alertness: Awake/alert Behavior During Therapy: Flat affect;WFL for tasks assessed/performed Overall Cognitive Status: Within Functional Limits for tasks assessed Area of Impairment: Safety/judgement;Problem solving                         Safety/Judgement: Decreased awareness of safety;Decreased awareness of deficits   Problem Solving: Slow processing;Decreased initiation;Requires verbal cues General Comments: Pt very slow to process, but appears to answer questions honestly.   General Comments  despite residual hemiparesis of R side from CVA in 2010, pt L side seems physically weaker    Exercises     Shoulder  Instructions      Home Living Family/patient expects to be discharged to:: Private residence Living Arrangements: Other relatives(sister) Available Help at Discharge: Family;Available 24 hours/day Type of Home: House Home Access: Stairs to enter [employer] of Steps: 3-4 Entrance Stairs-Rails: Can reach both Home Layout: One level     Bathroom Shower/Tub: Teacher, early years/pre: Standard Bathroom Accessibility: Yes   Home Equipment: Environmental consultant - 2 wheels;Tub bench;Grab bars - tub/shower   Additional Comments: chart mentions that pt uses w/c at home with sister, but pt did not mention having or using W/C      Prior Functioning/Environment Level of Independence: Needs assistance  Gait / Transfers Assistance Needed: pt mentions ambulating with "cane" but then indicates RW in the room as looking like the "cane" he uses at home. Pt with significant LLE weakness, therefore it is most likely he required assist or supervision prior to admission. ADL's / Homemaking Assistance Needed: pt reports his sister does the cooking and cleaning, but he is able to perform ADLs            OT Problem List: Decreased strength;Decreased activity tolerance;Impaired balance (sitting and/or standing);Decreased coordination;Decreased safety awareness;Pain;Increased edema;Decreased knowledge of use of DME or AE;Decreased knowledge of precautions      OT Treatment/Interventions: Self-care/ADL training;Therapeutic exercise;Energy conservation;DME and/or AE instruction;Therapeutic activities;Patient/family education;Balance training    OT Goals(Current goals can be found in the care plan section) Acute Rehab OT Goals Patient Stated Goal: to return home OT Goal Formulation: With patient Time For Goal Achievement: 12/27/19 Potential to Achieve Goals: Fair ADL Goals Pt Will Perform Grooming: standing;with min guard assist Pt Will Perform Lower Body Dressing: with mod  assist;sitting/lateral leans;sit to/from  stand;with adaptive equipment Pt Will Transfer to Toilet: with min assist;stand pivot transfer;bedside commode Pt Will Perform Toileting - Clothing Manipulation and hygiene: with mod assist;sitting/lateral leans;sit to/from stand Pt/caregiver will Perform Home Exercise Program: Increased strength;Both right and left upper extremity  OT Frequency: Min 2X/week   Barriers to D/C: Decreased caregiver support          Co-evaluation PT/OT/SLP Co-Evaluation/Treatment: Yes Reason for Co-Treatment: Complexity of the patient's impairments (multi-system involvement);To address functional/ADL transfers PT goals addressed during session: Mobility/safety with mobility;Balance;Proper use of DME;Strengthening/ROM OT goals addressed during session: ADL's and self-care;Strengthening/ROM      AM-PAC OT "6 Clicks" Daily Activity     Outcome Measure Help from another person eating meals?: None Help from another person taking care of personal grooming?: A Little Help from another person toileting, which includes using toliet, bedpan, or urinal?: A Lot Help from another person bathing (including washing, rinsing, drying)?: A Lot Help from another person to put on and taking off regular upper body clothing?: A Little Help from another person to put on and taking off regular lower body clothing?: Total 6 Click Score: 15   End of Session Equipment Utilized During Treatment: Gait belt;Rolling walker Nurse Communication: Mobility status;Need for lift equipment  Activity Tolerance: Patient tolerated treatment well;Patient limited by pain Patient left: in chair;with call bell/phone within reach;with chair alarm set;with nursing/sitter in room  OT Visit Diagnosis: Unsteadiness on feet (R26.81);Muscle weakness (generalized) (M62.81);Pain Pain - Right/Left: Right Pain - part of body: Hip                Time: [id number] OT Time Calculation (min): 37 min Charges:  OT  General Charges $OT Visit: 1 Visit OT Evaluation $OT Eval Moderate Complexity: Bland C OTR/L Acute Rehabilitation Services Pager: [phone] Office: [phone]   Amato Sevillano C 12/13/2019, 6:49 PM

## 2019-12-13 NOTE — Progress Notes (Signed)
    Subjective: Patient reports pain as moderate, controlled.   Tolerating diet.  Not yet mobilized.  Objective:   VITALS:   Vitals:   12/12/19 2043 12/13/19 0023 12/13/19 0435 12/13/19 0749  BP: (!) 144/66 139/72 (!) 144/73 (!) 153/71  Pulse: 72 70 70 78  Resp: 19 16 15 16   Temp: 98.9 F (37.2 C) 98.9 F (37.2 C) 99.3 F (37.4 C) 99.3 F (37.4 C)  TempSrc: Oral Oral Oral Oral  SpO2: 100% 99% 98% 96%  Weight:      Height:       CBC Latest Ref Rng & Units 12/13/2019 12/12/2019 12/12/2019  WBC 4.0 - 10.5 K/uL 9.1 - 10.2  Hemoglobin 13.0 - 17.0 g/dL 8.5(L) 9.7(L) 7.1(L)  Hematocrit 39.0 - 52.0 % 25.8(L) 28.9(L) 20.8(L)  Platelets 150 - 400 K/uL 173 - 195   BMP Latest Ref Rng & Units 12/13/2019 12/12/2019 12/11/2019  Glucose 70 - 99 mg/dL 107(H) 122(H) 106(H)  BUN 8 - 23 mg/dL 39(H) 51(H) 55(H)  Creatinine 0.61 - 1.24 mg/dL 2.50(H) 2.93(H) 3.22(H)  BUN/Creat Ratio 10 - 24 - - -  Sodium 135 - 145 mmol/L 141 140 140  Potassium 3.5 - 5.1 mmol/L 3.4(L) 3.4(L) 3.8  Chloride 98 - 111 mmol/L 105 104 104  CO2 22 - 32 mmol/L 25 24 23   Calcium 8.9 - 10.3 mg/dL 7.5(L) 7.9(L) 8.8(L)   Intake/Output      01/05 0701 - 01/06 0700 01/06 0701 - 01/07 0700   P.O. 240    I.V. (mL/kg) 1600 (23.7)    Blood 610    IV Piggyback 100    Total Intake(mL/kg) 2550 (37.8)    Urine (mL/kg/hr) 1150 (0.7)    Blood 50    Total Output 1200    Net +1350             Physical Exam: General: NAD.  Upright in bed.  Calm, conversant.  No increased work of breathing. MSK RLE: Neurovascularly intact Sensation intact distally Feet warm, moderate swelling EHL, FHL and movement of lesser toes intact Incision: dressings C/D/I -middle of 3 dressings with mild bloody drainage changed.  Underlying incision clean and intact.  No active bleeding.   Assessment: 1 Day Post-Op  S/P Procedure(s) (LRB): INTRAMEDULLARY (IM) NAIL FEMORAL (Right) by Dr. Ernesta Amble. Percell Miller on 12/12/2019  Active Problems:   Anemia    Hyperlipidemia   Benign hypertension with CKD (chronic kidney disease) stage [name]   Schizophrenia (HCC)   Closed displaced intertrochanteric fracture of right femur (HCC)   LFT elevation   AKI (acute kidney injury) ([place])   Closed right intertrochanteric femur fracture, status post IM nail Doing well postop day 1 Tolerating diet and voiding Pain controlled Not yet mobilized  Closed, incomplete, nondisplaced right distal fibula fracture Nonoperative management Cam walker boot when mobilizing-WBAT Elevate   Plan: Up with therapy Incentive Spirometry Apply ice PRN  Weightbearing: WBAT RLE.  CAM Walker boot when mobilizing Insicional and dressing care: Mepilex as needed Showering: Keep dressing dry VTE prophylaxis: Lovenox 30 days, SCDs, ambulation Pain control: Minimize narcotics.  Continue current regimen. Follow - up plan: 2 weeks Contact information:  Edmonia Lynch MD, Roxan Hockey PA-C  Dispo: TBD.  Therapy evaluations pending.  Discharge when mobilized and ready medically.    Daniel Elizabeth Martensen III, PA-C 12/13/2019, 8:04 AM

## 2019-12-13 NOTE — Plan of Care (Signed)
Notified Sylvan Cheese, DO via pager about K 3.4 this AM.   Problem: Education: Goal: Knowledge of General Education information will improve Description: Including pain rating scale, medication(s)/side effects and non-pharmacologic comfort measures Outcome: Progressing   Problem: Health Behavior/Discharge Planning: Goal: Ability to manage health-related needs will improve Outcome: Progressing   Problem: Clinical Measurements: Goal: Will remain free from infection Outcome: Progressing Goal: Cardiovascular complication will be avoided Outcome: Progressing   Problem: Activity: Goal: Risk for activity intolerance will decrease Outcome: Progressing   Problem: Elimination: Goal: Will not experience complications related to urinary retention Outcome: Progressing   Problem: Pain Managment: Goal: General experience of comfort will improve Outcome: Progressing   Problem: Safety: Goal: Ability to remain free from injury will improve Outcome: Progressing   Problem: Skin Integrity: Goal: Risk for impaired skin integrity will decrease Outcome: Progressing   Problem: Education: Goal: Verbalization of understanding the information provided (i.e., activity precautions, restrictions, etc) will improve Outcome: Progressing   Problem: Activity: Goal: Ability to ambulate and perform ADLs will improve Outcome: Progressing   Problem: Clinical Measurements: Goal: Postoperative complications will be avoided or minimized Outcome: Progressing

## 2019-12-14 LAB — RENAL FUNCTION PANEL
Albumin: 2.5 g/dL — ABNORMAL LOW (ref 3.5–5.0)
Anion gap: 11 (ref 5–15)
BUN: 32 mg/dL — ABNORMAL HIGH (ref 8–23)
CO2: 24 mmol/L (ref 22–32)
Calcium: 7.4 mg/dL — ABNORMAL LOW (ref 8.9–10.3)
Chloride: 104 mmol/L (ref 98–111)
Creatinine, Ser: 2.32 mg/dL — ABNORMAL HIGH (ref 0.61–1.24)
GFR calc Af Amer: 31 mL/min — ABNORMAL LOW (ref >60)
GFR calc non Af Amer: 27 mL/min — ABNORMAL LOW (ref >60)
Glucose, Bld: 101 mg/dL — ABNORMAL HIGH (ref 70–99)
Phosphorus: 2.1 mg/dL — ABNORMAL LOW (ref 2.5–4.6)
Potassium: 3.3 mmol/L — ABNORMAL LOW (ref 3.5–5.1)
Sodium: 139 mmol/L (ref 135–145)

## 2019-12-14 LAB — CBC WITH DIFFERENTIAL/PLATELET
Abs Immature Granulocytes: 0.06 10*3/uL (ref 0.00–0.07)
Basophils Absolute: 0 10*3/uL (ref 0.0–0.1)
Basophils Relative: 0 %
Eosinophils Absolute: 0.1 10*3/uL (ref 0.0–0.5)
Eosinophils Relative: 2 %
HCT: 24.2 % — ABNORMAL LOW (ref 39.0–52.0)
Hemoglobin: 8.1 g/dL — ABNORMAL LOW (ref 13.0–17.0)
Immature Granulocytes: 1 %
Lymphocytes Relative: 12 %
Lymphs Abs: 1.1 10*3/uL (ref 0.7–4.0)
MCH: 29.3 pg (ref 26.0–34.0)
MCHC: 33.5 g/dL (ref 30.0–36.0)
MCV: 87.7 fL (ref 80.0–100.0)
Monocytes Absolute: 1.5 10*3/uL — ABNORMAL HIGH (ref 0.1–1.0)
Monocytes Relative: 16 %
Neutro Abs: 6.6 10*3/uL (ref 1.7–7.7)
Neutrophils Relative %: 69 %
Platelets: 192 10*3/uL (ref 150–400)
RBC: 2.76 MIL/uL — ABNORMAL LOW (ref 4.22–5.81)
RDW: 16.6 % — ABNORMAL HIGH (ref 11.5–15.5)
WBC: 9.4 10*3/uL (ref 4.0–10.5)
nRBC: 0 % (ref 0.0–0.2)

## 2019-12-14 LAB — MAGNESIUM: Magnesium: 1.4 mg/dL — ABNORMAL LOW (ref 1.7–2.4)

## 2019-12-14 MED ORDER — POTASSIUM CHLORIDE CRYS ER 20 MEQ PO TBCR
40.0000 meq | EXTENDED_RELEASE_TABLET | Freq: Every day | ORAL | Status: DC
  Administered 2019-12-14 – 2019-12-16 (×3): 40 meq via ORAL
  Filled 2019-12-14 (×3): qty 2

## 2019-12-14 MED ORDER — SODIUM CHLORIDE 0.9 % IV SOLN: Freq: Once | INTRAVENOUS | Status: AC

## 2019-12-14 MED ORDER — K PHOS MONO-SOD PHOS DI & MONO 155-852-130 MG PO TABS
500.0000 mg | ORAL_TABLET | Freq: Three times a day (TID) | ORAL | Status: AC
  Administered 2019-12-14 (×2): 500 mg via ORAL
  Filled 2019-12-14 (×2): qty 2

## 2019-12-14 MED ORDER — MAGNESIUM SULFATE 2 GM/50ML IV SOLN
2.0000 g | Freq: Once | INTRAVENOUS | Status: AC
  Administered 2019-12-14: 11:00:00 2 g via INTRAVENOUS
  Filled 2019-12-14: qty 50

## 2019-12-14 NOTE — Plan of Care (Signed)

## 2019-12-14 NOTE — Progress Notes (Signed)
[name]  PROGRESS NOTE    ISHAK KEAST  [id number] DOB: 12/23/1945 DOA: 12/11/2019 PCP: Vivi Barrack, MD   Brief Narrative:   [name] a 74 y.o.malewith medical history significant ofHTN, CVA with residual right hemiparesis in 2010 on Plavix, schizophrenia, and psychosis.Patient was just mumbling most times when I talked to him,also have history obtained from patient history and ED staff.according to sister [name], whom he lives withpatient baseline usesa caneto ambulate. Hefell down2 days ago, trying to get out of bed,and the patient stayed on the floor for 2 hours before he coulduse some help from neighbor.Since then patient refused to get out of bed and walk again,continue to complain about the right leg pain and " all locked up of right hip". He tried to get up and put weight on his right leg again today and fell a second time. [name] called EMS. She states that the leg has been swollen for "a while now" but seemed more swollen today.riage nurse, EMS came to his home and found him to be clammy, fatigued, and lethargic and brought him to the ED. When patient was questioned, he states that he "just hit the floor" and "maybe he was mopping the floor". When asked other questions, he became somnolent and did not answer.   12/14/19: Hgb holding. HypoK+/Mg2+/phos today. Replete. SNF rec'd by PT/OT.    Assessment & Plan:   Active Problems:   Anemia   Hyperlipidemia   Benign hypertension with CKD (chronic kidney disease) stage III   Schizophrenia (HCC)   Closed displaced intertrochanteric fracture of right femur (HCC)   LFT elevation   AKI (acute kidney injury) ([place])  Right closed intertrochanteric fracture right femur     - s/p R femoral IMN     - PT/OT per orthopedic recommendations     - pain control with oxycodone and gabapentin     - senokot-docusate   AKI     - admission SCr 3.22 trending down to 2.32 today     - continue fluids     - watch nephrotoxins     -  has CKD w/ an unknown baseline, can not be more specific that CKD 3 - 4; need outside records  Normocytic Anemia Iron deficiency     - Likely some dilutional component 8.7 on admit to 7.1 12/12/19     - s/p 2 units PRBC prior to OR after consult with orthopedics     - anemia panel 12/11/19 iron 28, ferritin 131, B12 754; supplement iron at discharge     - Hgb is 8.1 this AM, monitor     - no signs of frank bleed  Elevated LFTs     - could be from acute trauma     - continues to trend down     - exam normal, continue to monitor  HTN     - continue lisinopril, metoprolol     - titrate as necessary  Hyperlipidemia     - continue home lipitor; monitor LFTs  Hypokalemia Hypomagnesemia Hypophosphatemia     - shedule K+ today, add IV Mg2+, schedule K-phos today  DVT prophylaxis: lovenox Code Status: FULL Family Communication: None at bedside   Disposition Plan: To SNF when available.  Consultants:   Orthopedics  Procedures:   Right femur IMN  ROS:  Denies CP, N, V, dyspnea, ab pain . Remainder 10-pt ROS is negative for all not previously mentioned.  Subjective: No acute events ON.   Objective: Vitals:  12/13/19 2039 12/13/19 2215 12/14/19 0233 12/14/19 0924  BP: (!) 154/70 (!) 169/83 135/63 (!) 148/72  Pulse: 79 79 70 86  Resp:  18  16  Temp: 100.1 F (37.8 C) 99.4 F (37.4 C) 98.3 F (36.8 C) 99.7 F (37.6 C)  TempSrc: Oral Oral Oral Oral  SpO2: 100% 99% 100% 99%  Weight:      Height:        Intake/Output Summary (Last 24 hours) at 12/14/2019 0936 Last data filed at 12/14/2019 0500 Gross per 24 hour  Intake 1371.74 ml  Output 1200 ml  Net 171.74 ml   Filed Weights   12/11/19 2119  Weight: 67.5 kg    Examination:  General: 74 y.o. male resting in bed in NAD Cardiovascular: RRR, +S1, S2, no m/g/r, equal pulses throughout Respiratory: CTABL, no w/r/r, normal WOB GI: BS+, NDNT, no masses noted, no organomegaly noted MSK: No e/c/c Neuro: alert to  name, follows commands Psyc: Appropriate interaction and affect, calm/cooperative   Data Reviewed: I have personally reviewed following labs and imaging studies.  CBC: Recent Labs  Lab 12/11/19 1410 12/12/19 0009 12/12/19 1941 12/13/19 0448 12/14/19 0322  WBC 13.4* 10.2  --  9.1 9.4  NEUTROABS 10.9*  --   --   --  6.6  HGB 8.7* 7.1* 9.7* 8.5* 8.1*  HCT 26.2* 20.8* 28.9* 25.8* 24.2*  MCV 92.3 90.8  --  88.7 87.7  PLT 211 195  --  173 [id number]   Basic Metabolic Panel: Recent Labs  Lab 12/11/19 1410 12/12/19 0009 12/13/19 0448 12/14/19 0322  NA 140 140 141 139  K 3.8 3.4* 3.4* 3.3*  CL 104 104 105 104  CO2 23 24 25 24   GLUCOSE 106* 122* 107* 101*  BUN 55* 51* 39* 32*  CREATININE 3.22* 2.93* 2.50* 2.32*  CALCIUM 8.8* 7.9* 7.5* 7.4*  MG  --   --   --  1.4*  PHOS  --   --   --  2.1*   GFR: Estimated Creatinine Clearance: 26.7 mL/min (A) (by C-G formula based on SCr of 2.32 mg/dL (H)). Liver Function Tests: Recent Labs  Lab 12/11/19 1410 12/12/19 0009 12/13/19 0448 12/14/19 0322  AST 168* 121* 69*  --   ALT 73* 57* 35  --   ALKPHOS 60 47 43  --   BILITOT 1.7* 1.2 0.7  --   PROT 7.7 6.0* 5.6*  --   ALBUMIN 4.0 3.2* 2.6* 2.5*   No results for input(s): LIPASE, AMYLASE in the last 168 hours. Recent Labs  Lab 12/12/19 0009  AMMONIA 40*   Coagulation Profile: No results for input(s): INR, PROTIME in the last 168 hours. Cardiac Enzymes: No results for input(s): CKTOTAL, CKMB, CKMBINDEX, TROPONINI in the last 168 hours. BNP (last 3 results) No results for input(s): PROBNP in the last 8760 hours. HbA1C: No results for input(s): HGBA1C in the last 72 hours. CBG: No results for input(s): GLUCAP in the last 168 hours. Lipid Profile: No results for input(s): CHOL, HDL, LDLCALC, TRIG, CHOLHDL, LDLDIRECT in the last 72 hours. Thyroid Function Tests: No results for input(s): TSH, T4TOTAL, FREET4, T3FREE, THYROIDAB in the last 72 hours. Anemia Panel: Recent Labs     12/12/19 0009  VITAMINB12 754  FERRITIN 131  TIBC 231*  IRON 28*  RETICCTPCT 3.4*   Sepsis Labs: No results for input(s): PROCALCITON, LATICACIDVEN in the last 168 hours.  Recent Results (from the past 240 hour(s))  Surgical pcr screen  Status: Abnormal   Collection Time: 12/11/19  9:24 PM   Specimen: Nasal Mucosa; Nasal Swab  Result Value Ref Range Status   MRSA, PCR NEGATIVE NEGATIVE Final   Staphylococcus aureus POSITIVE (A) NEGATIVE Final    Comment: (NOTE) The Xpert SA Assay (FDA approved for NASAL specimens in patients 84 years of age and older), is one component of a comprehensive surveillance program. It is not intended to diagnose infection nor to guide or monitor treatment. Performed at [facility], [place] [address], [place], [place] [address]   SARS CORONAVIRUS 2 (TAT 6-24 HRS) Nasopharyngeal Nasopharyngeal Swab     Status: None   Collection Time: 12/11/19  9:45 PM   Specimen: Nasopharyngeal Swab  Result Value Ref Range Status   SARS Coronavirus 2 NEGATIVE NEGATIVE Final    Comment: (NOTE) SARS-CoV-2 target nucleic acids are NOT DETECTED. The SARS-CoV-2 RNA is generally detectable in upper and lower respiratory specimens during the acute phase of infection. Negative results do not preclude SARS-CoV-2 infection, do not rule out co-infections with other pathogens, and should not be used as the sole basis for treatment or other patient management decisions. Negative results must be combined with clinical observations, patient history, and epidemiological information. The expected result is Negative. Fact Sheet for Patients: SugarRoll.be Fact Sheet for Healthcare Providers: https://www.woods-mathews.com/ This test is not yet approved or cleared by the [place] FDA and  has been authorized for detection and/or diagnosis of SARS-CoV-2 by FDA under an Emergency Use Authorization (EUA). This EUA will remain  in  effect (meaning this test can be used) for the duration of the COVID-19 declaration under Section 56 4(b)(1) of the Act, 21 U.S.C. section 360bbb-3(b)(1), unless the authorization is terminated or revoked sooner. Performed at [facility], [place] [address] [place], [place] [address]       Radiology Studies: DG C-Arm 1-60 Min  Result Date: 12/12/2019 CLINICAL DATA:  Right intertrochanteric femur fracture ORIF. EXAM: RIGHT FEMUR 2 VIEWS; DG C-ARM 1-60 MIN COMPARISON:  Right hip x-rays from yesterday. FLUOROSCOPY TIME:  57 seconds. C-arm fluoroscopic images were obtained intraoperatively and submitted for post operative interpretation. FINDINGS: Multiple intraoperative fluoroscopic images demonstrate interval cephalomedullary nail fixation of the right intertrochanteric femur fracture with improved alignment of the dominant fracture fragments. The lesser trochanter fragment remains displaced. IMPRESSION: Intraoperative fluoroscopic guidance for right intertrochanteric femur fracture ORIF. Electronically Signed   By: Titus Dubin M.D.   On: 12/12/2019 13:45   DG FEMUR, MIN 2 VIEWS RIGHT  Result Date: 12/12/2019 CLINICAL DATA:  Right intertrochanteric femur fracture ORIF. EXAM: RIGHT FEMUR 2 VIEWS; DG C-ARM 1-60 MIN COMPARISON:  Right hip x-rays from yesterday. FLUOROSCOPY TIME:  57 seconds. C-arm fluoroscopic images were obtained intraoperatively and submitted for post operative interpretation. FINDINGS: Multiple intraoperative fluoroscopic images demonstrate interval cephalomedullary nail fixation of the right intertrochanteric femur fracture with improved alignment of the dominant fracture fragments. The lesser trochanter fragment remains displaced. IMPRESSION: Intraoperative fluoroscopic guidance for right intertrochanteric femur fracture ORIF. Electronically Signed   By: Titus Dubin M.D.   On: 12/12/2019 13:45   VAS [place] LOWER EXTREMITY VENOUS (DVT)  Result Date: 12/12/2019  Lower Venous  Study Indications: Pain.  Limitations: Patient positioning. Performing Technologist: Abram Sander RVS  Examination Guidelines: A complete evaluation includes B-mode imaging, spectral Doppler, color Doppler, and power Doppler as needed of all accessible portions of each vessel. Bilateral testing is considered an integral part of a complete examination. Limited examinations for reoccurring indications  may be performed as noted.  +---------+---------------+---------+-----------+----------+--------------+ RIGHT    CompressibilityPhasicitySpontaneityPropertiesThrombus Aging +---------+---------------+---------+-----------+----------+--------------+ CFV      Full           Yes      Yes                                 +---------+---------------+---------+-----------+----------+--------------+ SFJ      Full                                                        +---------+---------------+---------+-----------+----------+--------------+ FV Prox  Full                                                        +---------+---------------+---------+-----------+----------+--------------+ FV Mid                  Yes      Yes                                 +---------+---------------+---------+-----------+----------+--------------+ FV Distal               Yes      Yes                                 +---------+---------------+---------+-----------+----------+--------------+ PFV      Full                                                        +---------+---------------+---------+-----------+----------+--------------+ POP                     Yes      Yes                                 +---------+---------------+---------+-----------+----------+--------------+ PTV      Full                                                        +---------+---------------+---------+-----------+----------+--------------+ PERO                                                  Not  visualized +---------+---------------+---------+-----------+----------+--------------+   +---------+---------------+---------+-----------+----------+--------------+ LEFT     CompressibilityPhasicitySpontaneityPropertiesThrombus Aging +---------+---------------+---------+-----------+----------+--------------+ CFV      Full           Yes      Yes                                 +---------+---------------+---------+-----------+----------+--------------+  SFJ      Full                                                        +---------+---------------+---------+-----------+----------+--------------+ FV Prox  Full                                                        +---------+---------------+---------+-----------+----------+--------------+ FV Mid   Full                                                        +---------+---------------+---------+-----------+----------+--------------+ FV DistalFull                                                        +---------+---------------+---------+-----------+----------+--------------+ PFV      Full                                                        +---------+---------------+---------+-----------+----------+--------------+ POP      Full           Yes      Yes                                 +---------+---------------+---------+-----------+----------+--------------+ PTV      Full                                                        +---------+---------------+---------+-----------+----------+--------------+ PERO     Full                                                        +---------+---------------+---------+-----------+----------+--------------+     Summary: Right: There is no evidence of deep vein thrombosis in the lower extremity. However, portions of this examination were limited- see technologist comments above. No cystic structure found in the popliteal fossa. Left: There is no evidence of deep vein  thrombosis in the lower extremity. No cystic structure found in the popliteal fossa.  *See table(s) above for measurements and observations. Electronically signed by Harold Barban MD on 12/12/2019 at 7:39:15 PM.    Final      Scheduled Meds: . sodium chloride   Intravenous Once  . atorvastatin  40 mg Oral Daily  . Chlorhexidine Gluconate Cloth  6 each Topical  Daily  . enoxaparin (LOVENOX) injection  30 mg Subcutaneous Q24H  . folic acid  1 mg Oral Daily  . gabapentin  100 mg Oral QHS  . lisinopril  20 mg Oral Daily  . metoprolol tartrate  100 mg Oral BID  . multivitamin with minerals  1 tablet Oral Daily  . mupirocin ointment  1 application Nasal BID  . phosphorus  500 mg Oral TID  . potassium chloride  40 mEq Oral Daily   Continuous Infusions: . sodium chloride 250 mL (12/12/19 1146)  . lactated ringers 10 mL/hr at 12/13/19 1636  . magnesium sulfate bolus IVPB       LOS: 3 days    Time spent: 25 minutes spent in the coordination of care today.    Jonnie Finner, DO [facility]  If 7PM-7AM, please contact night-coverage [email or web address] 12/14/2019, 9:36 AM

## 2019-12-14 NOTE — Plan of Care (Signed)

## 2019-12-14 NOTE — NC FL2 (Signed)
[place] LEVEL OF CARE SCREENING TOOL     IDENTIFICATION  Patient Name: Daniel [name] Birthdate: 05/05/1946 Sex: male Admission Date (Current Location): 12/11/2019  [facility] and [place] Number:  Herbalist and Address:  [facility] [place], [place] [address], [place], [place] [address]      Provider Number: [id number]  Attending Physician Name and Address:  Jonnie Finner, DO  Relative Name and Phone Number:  [name] [phone]    Current Level of Care: Hospital Recommended Level of Care: [place] Prior Approval Number:    Date Approved/Denied:   PASRR Number: pending  Discharge Plan: SNF    Current Diagnoses: Patient Active Problem List   Diagnosis Date Noted  . Closed displaced intertrochanteric fracture of right femur ([place]) 12/11/2019  . LFT elevation   . AKI (acute kidney injury) ([place])   . Nicotine dependence with current use 03/29/2018  . Poor dentition 01/17/2015  . Peripheral vascular disease ([place]) 01/18/2014  . Benign hypertension with CKD (chronic kidney disease) stage III 01/18/2014  . Hyperlipidemia 08/17/2011  . Hemiplegia, late effect of cerebrovascular disease ([place]) 10/17/2009  . Anemia 10/16/2008  . Substance abuse in remission ([place]) 10/16/2008  . Schizophrenia ([place]) 12/07/1972    Orientation RESPIRATION BLADDER Height & Weight     Self, Time, Situation, Place  Normal Incontinent, External catheter Weight: 67.5 kg Height:  6\' 1"  (185.4 cm)  BEHAVIORAL SYMPTOMS/MOOD NEUROLOGICAL BOWEL NUTRITION STATUS  Other (Comment)(N/A) (N/A) Continent Diet(See discharge summary)  AMBULATORY STATUS COMMUNICATION OF NEEDS Skin   Extensive Assist Verbally Surgical wounds(Right hip)                       Personal Care Assistance Level of Assistance  Bathing, Dressing, Feeding Bathing Assistance: Maximum assistance Feeding assistance: Limited assistance Dressing Assistance: Maximum assistance     Functional Limitations Info  Sight, Hearing, Speech Sight Info: Adequate Hearing Info: Adequate Speech Info: Adequate    SPECIAL CARE FACTORS FREQUENCY  PT (By licensed PT), OT (By licensed OT)     PT Frequency: Min 3X/week OT Frequency: Min 2X/week            Contractures Contractures Info: Not present    Additional Factors Info  Code Status, Allergies Code Status Info: Full Code Allergies Info: NKDA           Current Medications (12/14/2019):  This is the current hospital active medication list Current Facility-Administered Medications  Medication Dose Route Frequency Provider Last Rate Last Admin  . 0.9 %  sodium chloride infusion (Manually program via Guardrails IV Fluids)   Intravenous Once Prudencio Burly III, PA-C      . 0.9 %  sodium chloride infusion   Intravenous PRN Renette Butters, MD 10 mL/hr at 12/12/19 1146 250 mL at 12/12/19 1146  . acetaminophen (TYLENOL) tablet 650 mg  650 mg Oral Q6H PRN Prudencio Burly III, PA-C   650 mg at 12/13/19 2218   Or  . acetaminophen (TYLENOL) suppository 650 mg  650 mg Rectal Q6H PRN Prudencio Burly III, PA-C      . atorvastatin (LIPITOR) tablet 40 mg  40 mg Oral Daily Prudencio Burly III, PA-C   40 mg at 12/14/19 0926  . bisacodyl (DULCOLAX) suppository 10 mg  10 mg Rectal Daily PRN Prudencio Burly III, PA-C      . Chlorhexidine Gluconate Cloth 2 % PADS 6 each  6  each Topical Daily Prudencio Burly III, PA-C   6 each at 12/14/19 1120  . enoxaparin (LOVENOX) injection 30 mg  30 mg Subcutaneous Q24H Prudencio Burly III, PA-C   30 mg at 12/14/19 [id number]  . folic acid (FOLVITE) tablet 1 mg  1 mg Oral Daily Prudencio Burly III, PA-C   1 mg at 12/14/19 [id number]  . gabapentin (NEURONTIN) capsule 100 mg  100 mg Oral QHS Prudencio Burly III, PA-C   100 mg at 12/13/19 2218  . lisinopril (ZESTRIL) tablet 20 mg  20 mg Oral Daily Prudencio Burly III, PA-C   20 mg  at 12/14/19 [id number]  . metoCLOPramide (REGLAN) tablet 5-10 mg  5-10 mg Oral Q8H PRN Prudencio Burly III, PA-C       Or  . metoCLOPramide (REGLAN) injection 5-10 mg  5-10 mg Intravenous Q8H PRN Prudencio Burly III, PA-C      . metoprolol tartrate (LOPRESSOR) tablet 100 mg  100 mg Oral BID Prudencio Burly III, PA-C   100 mg at 12/14/19 [id number]  . morphine 2 MG/ML injection 0.5-1 mg  0.5-1 mg Intravenous Q4H PRN Prudencio Burly III, PA-C   1 mg at 12/13/19 1108  . multivitamin with minerals tablet 1 tablet  1 tablet Oral Daily Prudencio Burly III, PA-C   1 tablet at 12/14/19 [id number]  . mupirocin ointment (BACTROBAN) 2 % 1 application  1 application Nasal BID Prudencio Burly III, PA-C   1 application at [id number]  . ondansetron (ZOFRAN) tablet 4 mg  4 mg Oral Q6H PRN Prudencio Burly III, PA-C       Or  . ondansetron [facility]) injection 4 mg  4 mg Intravenous Q6H PRN Prudencio Burly III, PA-C      . oxyCODONE (Oxy IR/ROXICODONE) immediate release tablet 5 mg  5 mg Oral Q4H PRN Prudencio Burly III, PA-C   5 mg at 12/14/19 [id number]  . phosphorus (K PHOS NEUTRAL) tablet 500 mg  500 mg Oral TID Marylyn Ishihara, Tyrone A, DO   500 mg at 12/14/19 1118  . polyethylene glycol (MIRALAX / GLYCOLAX) packet 17 g  17 g Oral Daily PRN Prudencio Burly III, PA-C      . potassium chloride SA (KLOR-CON) CR tablet 40 mEq  40 mEq Oral Daily Kyle, Tyrone A, DO   40 mEq at 12/14/19 1118  . senna-docusate (Senokot-S) tablet 1 tablet  1 tablet Oral QHS PRN Prudencio Burly III, PA-C      . sodium phosphate (FLEET) 7-19 GM/118ML enema 1 enema  1 enema Rectal Once PRN Prudencio Burly III, PA-C         Discharge Medications: Please see discharge summary for a list of discharge medications.  Relevant Imaging Results:  Relevant Lab Results:   Additional Information SSN: [id number]  Midge Minium MSN, RN, NCM-BC, ACM-RN [phone]  Please be  advised that the above-named patient will require a short-term nursing home stay-anticipated 30 days or less for rehabilitation and strengthening. The plan is for return home.

## 2019-12-14 NOTE — Progress Notes (Signed)
    Subjective: Patient reports pain as moderate, mostly down at ankle, controlled.   Tolerating diet.  Up with therapy yesterday, but unable to put much weight on injured side which is also side of chronic hemiparesis from CVA 2010.  Objective:   VITALS:   Vitals:   12/13/19 1528 12/13/19 2039 12/13/19 2215 12/14/19 0233  BP:  (!) 154/70 (!) 169/83 135/63  Pulse:  79 79 70  Resp:   18   Temp:  100.1 F (37.8 C) 99.4 F (37.4 C) 98.3 F (36.8 C)  TempSrc:  Oral Oral Oral  SpO2: 98% 100% 99% 100%  Weight:      Height:       CBC Latest Ref Rng & Units 12/14/2019 12/13/2019 12/12/2019  WBC 4.0 - 10.5 K/uL 9.4 9.1 -  Hemoglobin 13.0 - 17.0 g/dL 8.1(L) 8.5(L) 9.7(L)  Hematocrit 39.0 - 52.0 % 24.2(L) 25.8(L) 28.9(L)  Platelets 150 - 400 K/uL 192 173 -   BMP Latest Ref Rng & Units 12/14/2019 12/13/2019 12/12/2019  Glucose 70 - 99 mg/dL 101(H) 107(H) 122(H)  BUN 8 - 23 mg/dL 32(H) 39(H) 51(H)  Creatinine 0.61 - 1.24 mg/dL 2.32(H) 2.50(H) 2.93(H)  BUN/Creat Ratio 10 - 24 - - -  Sodium 135 - 145 mmol/L 139 141 140  Potassium 3.5 - 5.1 mmol/L 3.3(L) 3.4(L) 3.4(L)  Chloride 98 - 111 mmol/L 104 105 104  CO2 22 - 32 mmol/L 24 25 24   Calcium 8.9 - 10.3 mg/dL 7.4(L) 7.5(L) 7.9(L)   Intake/Output      01/06 0701 - 01/07 0700   P.O. 720   I.V. (mL/kg) 891.7 (13.2)   Total Intake(mL/kg) 1611.7 (23.9)   Urine (mL/kg/hr) 1200 (0.7)   Total Output 1200   Net +411.7           Physical Exam: General: NAD.  Upright in bed.  Calm, conversant.  No increased work of breathing. MSK RLE: Neurovascularly intact - chronic hemiparesis / weakness Right sided. Sensation intact distally Feet warm, moderate swelling Incision: dressings C/D/I - scant drainage mid thigh lateral dressing.   Assessment: 2 Days Post-Op  S/P Procedure(s) (LRB): INTRAMEDULLARY (IM) NAIL FEMORAL (Right) by Dr. Ernesta Amble. Percell Miller on 12/12/2019  Active Problems:   Anemia   Hyperlipidemia   Benign hypertension with CKD  (chronic kidney disease) stage III   Schizophrenia ([place])   Closed displaced intertrochanteric fracture of right femur (HCC)   LFT elevation   AKI (acute kidney injury) ([place])   Closed right intertrochanteric femur fracture, status post IM nail AFVSN Doing well postop day 2 Tolerating diet and voiding Pain controlled Early mobilization.  SNF recommended by therapy teams.  Closed, incomplete, nondisplaced right distal fibula fracture Nonoperative management Cam walker boot when mobilizing-WBAT Elevate   Plan: Up with therapy Incentive Spirometry Apply ice PRN  Weightbearing: WBAT RLE.  CAM Walker boot when mobilizing Insicional and dressing care: Mepilex as needed Showering: Keep dressing dry VTE prophylaxis: Lovenox 30 days, SCDs, ambulation Pain control: Minimize narcotics.  Continue current regimen. Follow - up plan: 2 weeks Contact information:  Edmonia Lynch MD, Roxan Hockey PA-C  Dispo:   Therapy evaluations ongoing.  SNF recommended.  Discharge when ready medically.    Prudencio Burly III, PA-C 12/14/2019, 6:43 AM

## 2019-12-15 ENCOUNTER — Inpatient Hospital Stay (HOSPITAL_COMMUNITY): Payer: Medicare PPO

## 2019-12-15 LAB — RENAL FUNCTION PANEL
Albumin: 2.4 g/dL — ABNORMAL LOW (ref 3.5–5.0)
Anion gap: 10 (ref 5–15)
BUN: 26 mg/dL — ABNORMAL HIGH (ref 8–23)
CO2: 23 mmol/L (ref 22–32)
Calcium: 7.4 mg/dL — ABNORMAL LOW (ref 8.9–10.3)
Chloride: 102 mmol/L (ref 98–111)
Creatinine, Ser: 2.36 mg/dL — ABNORMAL HIGH (ref 0.61–1.24)
GFR calc Af Amer: 30 mL/min — ABNORMAL LOW (ref >60)
GFR calc non Af Amer: 26 mL/min — ABNORMAL LOW (ref >60)
Glucose, Bld: 137 mg/dL — ABNORMAL HIGH (ref 70–99)
Phosphorus: 2.8 mg/dL (ref 2.5–4.6)
Potassium: 3.7 mmol/L (ref 3.5–5.1)
Sodium: 135 mmol/L (ref 135–145)

## 2019-12-15 LAB — CBC WITH DIFFERENTIAL/PLATELET
Abs Immature Granulocytes: 0.09 10*3/uL — ABNORMAL HIGH (ref 0.00–0.07)
Basophils Absolute: 0 10*3/uL (ref 0.0–0.1)
Basophils Relative: 0 %
Eosinophils Absolute: 0.2 10*3/uL (ref 0.0–0.5)
Eosinophils Relative: 1 %
HCT: 24.8 % — ABNORMAL LOW (ref 39.0–52.0)
Hemoglobin: 8.3 g/dL — ABNORMAL LOW (ref 13.0–17.0)
Immature Granulocytes: 1 %
Lymphocytes Relative: 8 %
Lymphs Abs: 0.9 10*3/uL (ref 0.7–4.0)
MCH: 29.3 pg (ref 26.0–34.0)
MCHC: 33.5 g/dL (ref 30.0–36.0)
MCV: 87.6 fL (ref 80.0–100.0)
Monocytes Absolute: 1.6 10*3/uL — ABNORMAL HIGH (ref 0.1–1.0)
Monocytes Relative: 14 %
Neutro Abs: 8.4 10*3/uL — ABNORMAL HIGH (ref 1.7–7.7)
Neutrophils Relative %: 76 %
Platelets: 220 10*3/uL (ref 150–400)
RBC: 2.83 MIL/uL — ABNORMAL LOW (ref 4.22–5.81)
RDW: 15.9 % — ABNORMAL HIGH (ref 11.5–15.5)
WBC: 11.1 10*3/uL — ABNORMAL HIGH (ref 4.0–10.5)
nRBC: 0 % (ref 0.0–0.2)

## 2019-12-15 MED ORDER — AMLODIPINE BESYLATE 5 MG PO TABS
5.0000 mg | ORAL_TABLET | Freq: Every day | ORAL | Status: DC
  Administered 2019-12-15 – 2019-12-16 (×2): 5 mg via ORAL
  Filled 2019-12-15 (×2): qty 1

## 2019-12-15 NOTE — TOC Initial Note (Signed)
Transition of Care [facility]) - Initial/Assessment Note    Patient Details  Name: Daniel [name] MRN: [id number] Date of Birth: 1946-01-30  Transition of Care [facility]) CM/SW Contact:    Midge Minium MSN, RN, NCM-BC, ACM-RN [phone] Phone Number: 12/15/2019, 3:49 PM  Clinical Narrative:                 CM following for dispositional needs. CM spoke to the patients sister/caregiver Daniel [name] to discuss the POC. Patient is a 74 yo s/p IM nail r femoral on 1/5 and R non displaced ankle fx after fall at home. PMH HTN CVA with R hemiparesis 2010, schizophrenia and psychosis. PT/OT eval completed with ST SNF recommended. Per patients sister, the patient has only a cane to assist during ambulation. CM discussed recommendations for ST SNF for rehab, with the sister stating that her brother wouldn't agree d/t COVID risks and prefers to transition home with [facility] with [facility]. CM updated [name] PT with SNF remaining appropriate at this time; the patient is requiring max A x 2; sister verbalized having a cousin to assist her with the patient at home. CM team will continue to follow prior to further discuss a safe DCP; patient will require insurance auth if ST SNF is decided.   Expected Discharge Plan: [place] Barriers to Discharge: Continued Medical Work up   Patient Goals and CMS Choice   CMS Medicare.gov Compare Post Acute Care list provided to:: Patient Represenative (must comment)(Daniel [name] (sister/caregiver)) Choice offered to / list presented to : Sibling(Daniel [name] Event organiser))  Expected Discharge Plan and Services Expected Discharge Plan: [place] In-house Referral: NA Discharge Planning Services: CM Consult Post Acute Care Choice: [place] arrangements for the past 2 months: Single Family Home                  Prior Living Arrangements/Services Living arrangements for the past 2 months: Single Family Home Lives with::  Self, Siblings Patient language and need for interpreter reviewed:: Yes        Need for Family Participation in Patient Care: Yes (Comment)   Current home services: DME Criminal Activity/Legal Involvement Pertinent to Current Situation/Hospitalization: No - Comment as needed  Activities of Daily Living Home Assistive Devices/Equipment: Cane (specify quad or straight) ADL Screening (condition at time of admission) Patient's cognitive ability adequate to safely complete daily activities?: Yes Is the patient deaf or have difficulty hearing?: No Does the patient have difficulty seeing, even when wearing glasses/contacts?: No Does the patient have difficulty concentrating, remembering, or making decisions?: No Patient able to express need for assistance with ADLs?: No Does the patient have difficulty dressing or bathing?: No Independently performs ADLs?: Yes (appropriate for developmental age) Does the patient have difficulty walking or climbing stairs?: No Weakness of Legs: Right Weakness of Arms/Hands: None   Emotional Assessment   Alcohol / Substance Use: Not Applicable Psych Involvement: No (comment)  Admission diagnosis:  Fall [W19.XXXA] AKI (acute kidney injury) ([place]) [N17.9] LFT elevation [R79.89] Closed displaced intertrochanteric fracture of right femur, initial encounter ([place]) [S72.141A] Closed right hip fracture, initial encounter [facility]) [S72.001A] Patient Active Problem List   Diagnosis Date Noted  . Closed displaced intertrochanteric fracture of right femur ([place]) 12/11/2019  . LFT elevation   . AKI (acute kidney injury) ([place])   . Nicotine dependence with current use 03/29/2018  . Poor dentition 01/17/2015  . Peripheral vascular disease ([place]) 01/18/2014  . Benign hypertension with CKD (  chronic kidney disease) stage III 01/18/2014  . Hyperlipidemia 08/17/2011  . Hemiplegia, late effect of cerebrovascular disease ([place]) 10/17/2009  . Anemia 10/16/2008  . Substance  abuse in remission ([place]) 10/16/2008  . Schizophrenia ([place]) 12/07/1972   PCP:  Vivi Barrack, MD Pharmacy:   [id number] - [place], [address] [place] [address] Phone: [phone] Fax: [phone]     Social Determinants of Health (SDOH) Interventions    Readmission Risk Interventions No flowsheet data found.

## 2019-12-15 NOTE — Plan of Care (Signed)
  Problem: Education: Goal: Knowledge of General Education information will improve Description: Including pain rating scale, medication(s)/side effects and non-pharmacologic comfort measures Outcome: Progressing   Problem: Health Behavior/Discharge Planning: Goal: Ability to manage health-related needs will improve Outcome: Progressing   Problem: Nutrition: Goal: Adequate nutrition will be maintained Outcome: Progressing   

## 2019-12-15 NOTE — Progress Notes (Signed)
Physical Therapy Treatment Patient Details Name: Daniel [name] MRN: [id number] DOB: 1946-07-29 Today's Date: 12/15/2019    History of Present Illness 74 yo s/p IM nail r femoral on 1/5 and R non displaced ankle fx after fall at home. PMH HTN CVA with R hemiparesis 2010, schizophrenia and psychosis    PT Comments    Pt in bed upon PT arrival, agreeable to PT session with goal of progressing ambulation and independence. However, pt is reporting sig increased pain in his LLE today, and required significantly more assist to complete all bed mobility and to transfer to sitting EOB (heavy max of 2). Once sitting EOB, the pt requires more assist to maintain upright, but still sits with significant posterior and left lean which was not the case on initial eval. The pt was unable to correct despite multimodal cues throughout the session. The pt also demonstrated poor  L LE and UE movement, coordination, and strength throughout the session, and was unable to rise to standing even with use of the stedy and max A of 2. The RN and charge RN were notified and evaluated the pt with PT present. Per CM, the family has expressed interest in taking the pt home with [facility] services but at this time, SNF remains the most appropriate for the patient's abilities. PT will continue to follow acutely and continue to assess for safe d/c plans that suit pt and family desires.    Follow Up Recommendations  SNF;Supervision/Assistance - 24 hour     Equipment Recommendations  Other (comment)(if pt goes to SNF, defer recommendations to SNF. If pt were to go home he would need WC, 3-in-1, as well as possibly a hospital bed and lift due to presentation today)    Recommendations for Other Services       Precautions / Restrictions Precautions Precautions: Fall Required Braces or Orthoses: Other Brace Other Brace: Cam boot RLE Restrictions Weight Bearing Restrictions: Yes RLE Weight Bearing: Weight bearing as tolerated     Mobility  Bed Mobility Overal bed mobility: Needs Assistance Bed Mobility: Supine to Sit     Supine to sit: +2 for physical assistance;HOB elevated;Max assist;Total assist     General bed mobility comments: Pt requiring heavy max assist for BLE management and trunk elevation; tactile cues for LUE reaching for rail. Pt with increased need for assist due to increased difficulty moving BLE and significant posterior and R lean with trunk.  Transfers Overall transfer level: Needs assistance Equipment used: Ambulation equipment used Transfers: Sit to/from Stand Sit to Stand: Total assist;+2 physical assistance;From elevated surface(pt unable to complete sit-stand)         General transfer comment: pt attempted sit-stand x3 with heavy maxA of 2 but was unsuccessful. Pt with sig decreased command following and processing, pt also reports both increased pain in LLE as well as being unable to feel touching on LLE.  Ambulation/Gait             General Gait Details: unable   Stairs             Wheelchair Mobility    Modified Rankin (Stroke Patients Only) Modified Rankin (Stroke Patients Only) Pre-Morbid Rankin Score: (unknown, stroke occured 10 years ago) Modified Rankin: Severe disability     Balance Overall balance assessment: Needs assistance Sitting-balance support: Bilateral upper extremity supported;Feet supported Sitting balance-Leahy Scale: Poor Sitting balance - Comments: pt needs maxA for most of time sitting EOB due to post and L lean. Pt unable to adjust  even with multiple cues and repositioning. Does not acknowledge if he is aware of leaning. Postural control: Posterior lean;Left lateral lean   Standing balance-Leahy Scale: Zero Standing balance comment: unable to attempt                            Cognition Arousal/Alertness: Awake/alert Behavior During Therapy: Flat affect;WFL for tasks assessed/performed Overall Cognitive Status:  Within Functional Limits for tasks assessed Area of Impairment: Safety/judgement;Problem solving                         Safety/Judgement: Decreased awareness of safety;Decreased awareness of deficits   Problem Solving: Slow processing;Decreased initiation;Requires verbal cues General Comments: Pt continues to have slow processing, but is having increased difficulty with command following even with verbal, visual, and tactile cues. Pt presents with sig decline in strength, mobility, and coordination, unsure at this time how much is due to cognition/processing vs functional deficits.      Exercises      General Comments General comments (skin integrity, edema, etc.): Pt with record of CVA with R hemiparesis. PT presents today with sig worsened strength in L UE and L LE, increased pain in LLE, and impaired coordination in LUE and LLE. charge Theatre stage manager were notified, CNA confirms this is a sig decline in function from prior days.      Pertinent Vitals/Pain Pain Assessment: Faces Faces Pain Scale: Hurts little more Pain Location: L leg, R foot Pain Descriptors / Indicators: Sore;Grimacing;Discomfort;Shooting;Sharp(pt reports sig shooting pain with removal of SCD on LLE) Pain Intervention(s): Limited activity within patient's tolerance;Monitored during session;Repositioned    Home Living                      Prior Function            PT Goals (current goals can now be found in the care plan section) Acute Rehab PT Goals Patient Stated Goal: to return home PT Goal Formulation: With patient Time For Goal Achievement: 12/27/19 Potential to Achieve Goals: Fair Progress towards PT goals: Not progressing toward goals - comment(pt with sig decline in function today, will keep goals for now and re-assess if decline persists.)    Frequency    Min 3X/week      PT Plan Current plan remains appropriate    Co-evaluation              AM-PAC PT "6 Clicks"  Mobility   Outcome Measure  Help needed turning from your back to your side while in a flat bed without using bedrails?: A Lot Help needed moving from lying on your back to sitting on the side of a flat bed without using bedrails?: A Lot Help needed moving to and from a bed to a chair (including a wheelchair)?: Total Help needed standing up from a chair using your arms (e.g., wheelchair or bedside chair)?: Total Help needed to walk in hospital room?: Total Help needed climbing 3-5 steps with a railing? : Total 6 Click Score: 8    End of Session Equipment Utilized During Treatment: Other (comment);Gait belt(cam boot RLE) Activity Tolerance: Patient tolerated treatment well;Other (comment)(pt was pleasant and reports no change in pain, but was unable to complete OOB mobility due to sig decline in strength) Patient left: in bed;with call bell/phone within reach;with bed alarm set Nurse Communication: Mobility status;Need for lift equipment PT Visit Diagnosis: Unsteadiness on feet (  R26.81);Difficulty in walking, not elsewhere classified (R26.2);Muscle weakness (generalized) (M62.81);Hemiplegia and hemiparesis;Pain Hemiplegia - Right/Left: Right Hemiplegia - dominant/non-dominant: Dominant Hemiplegia - caused by: Unspecified Pain - Right/Left: Right(both) Pain - part of body: Leg     Time: 1050-1130 PT Time Calculation (min) (ACUTE ONLY): 40 min  Charges:  $Therapeutic Exercise: 8-22 mins $Therapeutic Activity: 23-37 mins                     Karma Ganja, PT, DPT   Acute Rehabilitation Department [phone]   Otho Bellows 12/15/2019, 1:02 PM

## 2019-12-15 NOTE — Progress Notes (Signed)
    Subjective: Patient reports pain as continuing to improve, controlled.   Tolerating diet.  Up with therapy 12/12/18, but unable to put much weight on injured side which is also side of chronic hemiparesis from CVA 2010.  Objective:   VITALS:   Vitals:   12/14/19 0233 12/14/19 0924 12/14/19 1937 12/15/19 0338  BP: 135/63 (!) 148/72 (!) 169/73 (!) 161/80  Pulse: 70 86 85 79  Resp:  16 18 18   Temp: 98.3 F (36.8 C) 99.7 F (37.6 C) 99.3 F (37.4 C) 98.4 F (36.9 C)  TempSrc: Oral Oral Oral Oral  SpO2: 100% 99% 100% 99%  Weight:      Height:       CBC Latest Ref Rng & Units 12/14/2019 12/13/2019 12/12/2019  WBC 4.0 - 10.5 K/uL 9.4 9.1 -  Hemoglobin 13.0 - 17.0 g/dL 8.1(L) 8.5(L) 9.7(L)  Hematocrit 39.0 - 52.0 % 24.2(L) 25.8(L) 28.9(L)  Platelets 150 - 400 K/uL 192 173 -   BMP Latest Ref Rng & Units 12/14/2019 12/13/2019 12/12/2019  Glucose 70 - 99 mg/dL 101(H) 107(H) 122(H)  BUN 8 - 23 mg/dL 32(H) 39(H) 51(H)  Creatinine 0.61 - 1.24 mg/dL 2.32(H) 2.50(H) 2.93(H)  BUN/Creat Ratio 10 - 24 - - -  Sodium 135 - 145 mmol/L 139 141 140  Potassium 3.5 - 5.1 mmol/L 3.3(L) 3.4(L) 3.4(L)  Chloride 98 - 111 mmol/L 104 105 104  CO2 22 - 32 mmol/L 24 25 24   Calcium 8.9 - 10.3 mg/dL 7.4(L) 7.5(L) 7.9(L)   Intake/Output      01/07 0701 - 01/08 0700 01/08 0701 - 01/09 0700   P.O. 240    I.V. (mL/kg) 273.9 (4.1)    IV Piggyback 50    Total Intake(mL/kg) 563.9 (8.4)    Urine (mL/kg/hr) 800 (0.5)    Total Output 800    Net -236.2         Urine Occurrence 1 x        Physical Exam: General: NAD.  Upright in bed.  Calm, conversant.  No increased work of breathing. MSK RLE: Neurovascularly intact - chronic hemiparesis / weakness Right sided. Sensation intact distally Feet warm, moderate swelling Incision: dressings C/D/I - scant stable drainage mid thigh lateral dressing.   Assessment: 3 Days Post-Op  S/P Procedure(s) (LRB): INTRAMEDULLARY (IM) NAIL FEMORAL (Right) by Dr. Ernesta Amble.  Percell Miller on 12/12/2019  Active Problems:   Anemia   Hyperlipidemia   Benign hypertension with CKD (chronic kidney disease) stage III   Schizophrenia (HCC)   Closed displaced intertrochanteric fracture of right femur (HCC)   LFT elevation   AKI (acute kidney injury) ([place])   Closed right intertrochanteric femur fracture, status post IM nail Stable from an orthopedic perspective Pain controlled Early mobilization.  SNF recommended by therapy teams.  Closed, incomplete, nondisplaced right distal fibula fracture Nonoperative management Cam walker boot when mobilizing-WBAT Elevate   Plan: Mobilize with therapy Incentive Spirometry Apply ice PRN  Weightbearing: WBAT RLE.  CAM Walker boot when mobilizing Insicional and dressing care: Mepilex as needed Showering: Keep dressing dry VTE prophylaxis: Lovenox 30 days, SCDs, ambulation Pain control: Minimize narcotics.  Continue current regimen. Contact information:  Edmonia Lynch MD, Roxan Hockey PA-C  Dispo:   Therapy evaluations ongoing.  SNF planning in process.  Discharge when ready medically.  Follow up in the office with Dr. Alain Marion in 2 weeks.  Please call with questions.   Prudencio Burly III, PA-C 12/15/2019, 7:57 AM

## 2019-12-15 NOTE — Progress Notes (Signed)
Daniel [name]  PROGRESS NOTE    Daniel [name]  [id number] DOB: 11/11/1946 DOA: 12/11/2019 PCP: Vivi Barrack, MD   Brief Narrative:   Daniel [name] a 74 y.o.malewith medical history significant ofHTN, CVA with residual right hemiparesis in 2010 on Plavix, schizophrenia, and psychosis.Patient was just mumbling most times when I talked to him,also have history obtained from patient history and ED staff.according to sister Daniel [name], whom he lives withpatient baseline usesa caneto ambulate. Hefell down2 days ago, trying to get out of bed,and the patient stayed on the floor for 2 hours before he coulduse some help from neighbor.Since then patient refused to get out of bed and walk again,continue to complain about the right leg pain and " all locked up of right hip". He tried to get up and put weight on his right leg again today and fell a second time. Daniel [name] called EMS. She states that the leg has been swollen for "a while now" but seemed more swollen today.riage nurse, EMS came to his home and found him to be clammy, fatigued, and lethargic and brought him to the ED. When patient was questioned, he states that he "just hit the floor" and "maybe he was mopping the floor". When asked other questions, he became somnolent and did not answer.   12/15/19: C/o L side weakness. [place] ordered. No acute process noted. Exam in the PM is stable (good LUE strength, stable LLE strength)   Assessment & Plan:   Active Problems:   Anemia   Hyperlipidemia   Benign hypertension with CKD (chronic kidney disease) stage III   Schizophrenia (HCC)   Closed displaced intertrochanteric fracture of right femur (HCC)   LFT elevation   AKI (acute kidney injury) ([place])  Right closed intertrochanteric fracture right femur - s/p R femoral IMN - PT/OT per orthopedic recommendations - pain control with oxycodone and gabapentin - senokot-docusate  AKI - admission SCr 3.22 trending down to 2.32  today - continue fluids - watch nephrotoxins - has CKD w/ an unknown baseline, can not be more specific that CKD 3 - 4; need outside records  Normocytic Anemia Iron deficiency - Likely some dilutional component 8.7 on admit to 7.1 12/12/19 - s/p 2 units PRBC prior to OR after consult with orthopedics - anemia panel 12/11/19 iron 28, ferritin 131, B12 754; supplement iron at discharge - Hgb is 8.3 this AM, monitor - no signs of frank bleed  Elevated LFTs - could be from acute trauma - continues to trend down - exam normal, continue to monitor  HTN - continue lisinopril, metoprolol - titrate as necessary     - add amlodipine  Hyperlipidemia - continue home lipitor; monitor LFTs  Hypokalemia Hypomagnesemia Hypophosphatemia - improved, monitor  DVT prophylaxis: lovenox Code Status: FULL Family Communication: None at bedside   Disposition Plan: TBD  Consultants:   Orthopedics  ROS:  Denies CP, N, V, ab pain. Reports LLE pain, weakness . Remainder 10-pt ROS is negative for all not previously mentioned.  Subjective: "My leg hurts."  Objective: Vitals:   12/14/19 1937 12/15/19 0338 12/15/19 0902 12/15/19 1322  BP: (!) 169/73 (!) 161/80 (!) 152/73 (!) 160/85  Pulse: 85 79 87 73  Resp: 18 18 17 18   Temp: 99.3 F (37.4 C) 98.4 F (36.9 C) 98.3 F (36.8 C) 100.3 F (37.9 C)  TempSrc: Oral Oral Oral Oral  SpO2: 100% 99% 100% 100%  Weight:      Height:        Intake/Output  Summary (Last 24 hours) at 12/15/2019 1448 Last data filed at 12/14/2019 2101 Gross per 24 hour  Intake 563.85 ml  Output 800 ml  Net -236.15 ml   Filed Weights   12/11/19 2119  Weight: 67.5 kg    Examination:  General: 74 y.o. male resting in bed in NAD Cardiovascular: RRR, +S1, S2, no m/g/r, equal pulses throughout Respiratory: CTABL, no w/r/r, normal WOB GI: BS+, NDNT, soft MSK: No e/c/c Neuro: A&O x 3, BUE str good, LLE  good but he is slow to follow commands (d/t pain?) Psyc: calm/cooperative   Data Reviewed: I have personally reviewed following labs and imaging studies.  CBC: Recent Labs  Lab 12/11/19 1410 12/12/19 0009 12/12/19 1941 12/13/19 0448 12/14/19 0322 12/15/19 0812  WBC 13.4* 10.2  --  9.1 9.4 11.1*  NEUTROABS 10.9*  --   --   --  6.6 8.4*  HGB 8.7* 7.1* 9.7* 8.5* 8.1* 8.3*  HCT 26.2* 20.8* 28.9* 25.8* 24.2* 24.8*  MCV 92.3 90.8  --  88.7 87.7 87.6  PLT 211 195  --  173 192 [id number]   Basic Metabolic Panel: Recent Labs  Lab 12/11/19 1410 12/12/19 0009 12/13/19 0448 12/14/19 0322 12/15/19 0812  NA 140 140 141 139 135  K 3.8 3.4* 3.4* 3.3* 3.7  CL 104 104 105 104 102  CO2 23 24 25 24 23   GLUCOSE 106* 122* 107* 101* 137*  BUN 55* 51* 39* 32* 26*  CREATININE 3.22* 2.93* 2.50* 2.32* 2.36*  CALCIUM 8.8* 7.9* 7.5* 7.4* 7.4*  MG  --   --   --  1.4*  --   PHOS  --   --   --  2.1* 2.8   GFR: Estimated Creatinine Clearance: 26.2 mL/min (A) (by C-G formula based on SCr of 2.36 mg/dL (H)). Liver Function Tests: Recent Labs  Lab 12/11/19 1410 12/12/19 0009 12/13/19 0448 12/14/19 0322 12/15/19 0812  AST 168* 121* 69*  --   --   ALT 73* 57* 35  --   --   ALKPHOS 60 47 43  --   --   BILITOT 1.7* 1.2 0.7  --   --   PROT 7.7 6.0* 5.6*  --   --   ALBUMIN 4.0 3.2* 2.6* 2.5* 2.4*   No results for input(s): LIPASE, AMYLASE in the last 168 hours. Recent Labs  Lab 12/12/19 0009  AMMONIA 40*   Coagulation Profile: No results for input(s): INR, PROTIME in the last 168 hours. Cardiac Enzymes: No results for input(s): CKTOTAL, CKMB, CKMBINDEX, TROPONINI in the last 168 hours. BNP (last 3 results) No results for input(s): PROBNP in the last 8760 hours. HbA1C: No results for input(s): HGBA1C in the last 72 hours. CBG: No results for input(s): GLUCAP in the last 168 hours. Lipid Profile: No results for input(s): CHOL, HDL, LDLCALC, TRIG, CHOLHDL, LDLDIRECT in the last 72  hours. Thyroid Function Tests: No results for input(s): TSH, T4TOTAL, FREET4, T3FREE, THYROIDAB in the last 72 hours. Anemia Panel: No results for input(s): VITAMINB12, FOLATE, FERRITIN, TIBC, IRON, RETICCTPCT in the last 72 hours. Sepsis Labs: No results for input(s): PROCALCITON, LATICACIDVEN in the last 168 hours.  Recent Results (from the past 240 hour(s))  Surgical pcr screen     Status: Abnormal   Collection Time: 12/11/19  9:24 PM   Specimen: Nasal Mucosa; Nasal Swab  Result Value Ref Range Status   MRSA, PCR NEGATIVE NEGATIVE Final   Staphylococcus aureus POSITIVE (A) NEGATIVE Final  Comment: (NOTE) The Xpert SA Assay (FDA approved for NASAL specimens in patients 29 years of age and older), is one component of a comprehensive surveillance program. It is not intended to diagnose infection nor to guide or monitor treatment. Performed at [facility], [place] [address] [place], [place] [address]   SARS CORONAVIRUS 2 (TAT 6-24 HRS) Nasopharyngeal Nasopharyngeal Swab     Status: None   Collection Time: 12/11/19  9:45 PM   Specimen: Nasopharyngeal Swab  Result Value Ref Range Status   SARS Coronavirus 2 NEGATIVE NEGATIVE Final    Comment: (NOTE) SARS-CoV-2 target nucleic acids are NOT DETECTED. The SARS-CoV-2 RNA is generally detectable in upper and lower respiratory specimens during the acute phase of infection. Negative results do not preclude SARS-CoV-2 infection, do not rule out co-infections with other pathogens, and should not be used as the sole basis for treatment or other patient management decisions. Negative results must be combined with clinical observations, patient history, and epidemiological information. The expected result is Negative. Fact Sheet for Patients: SugarRoll.be Fact Sheet for Healthcare Providers: https://www.woods-mathews.com/ This test is not yet approved or cleared by the [place] FDA and   has been authorized for detection and/or diagnosis of SARS-CoV-2 by FDA under an Emergency Use Authorization (EUA). This EUA will remain  in effect (meaning this test can be used) for the duration of the COVID-19 declaration under Section 56 4(b)(1) of the Act, 21 U.S.C. section 360bbb-3(b)(1), unless the authorization is terminated or revoked sooner. Performed at [facility], [place] [address], [place], [place] [address]       Radiology Studies: CT HEAD WO CONTRAST  Result Date: 12/15/2019 CLINICAL DATA:  Left-sided weakness. EXAM: CT HEAD WITHOUT CONTRAST TECHNIQUE: Contiguous axial images were obtained from the base of the skull through the vertex without intravenous contrast. COMPARISON:  Head CT scan 12/11/2019.  Brain MRI 07/31/2009. FINDINGS: Brain: No evidence of acute infarction, hemorrhage, hydrocephalus, extra-axial collection or mass lesion/mass effect. Remote left ACA and left posterior watershed territory infarcts again seen. Atrophy and chronic microvascular ischemic change noted. Vascular: Atherosclerosis. Skull: No fracture. Small area of increased density in the left sphenoid wing is noted as per seen on the prior head CT. Sinuses/Orbits: Mild mucosal thickening left maxillary sinus. Other: None. IMPRESSION: No acute abnormality or change compared to the prior exam. Atrophy, chronic microvascular ischemic change remote left-sided infarcts. Atherosclerosis. Mild mucosal thickening left maxillary sinus. Electronically Signed   By: Inge Rise M.D.   On: 12/15/2019 12:50     Scheduled Meds: . sodium chloride   Intravenous Once  . atorvastatin  40 mg Oral Daily  . Chlorhexidine Gluconate Cloth  6 each Topical Daily  . enoxaparin (LOVENOX) injection  30 mg Subcutaneous Q24H  . folic acid  1 mg Oral Daily  . gabapentin  100 mg Oral QHS  . lisinopril  20 mg Oral Daily  . metoprolol tartrate  100 mg Oral BID  . multivitamin with minerals  1 tablet Oral Daily  .  mupirocin ointment  1 application Nasal BID  . potassium chloride  40 mEq Oral Daily   Continuous Infusions: . sodium chloride 250 mL (12/12/19 1146)     LOS: 4 days    Time spent: 25 minutes spent in the coordination of care today.    Jonnie Finner, DO [facility]  If 7PM-7AM, please contact night-coverage [email or web address] 12/15/2019, 2:48 PM

## 2019-12-16 LAB — CBC WITH DIFFERENTIAL/PLATELET
Abs Immature Granulocytes: 0.06 10*3/uL (ref 0.00–0.07)
Basophils Absolute: 0 10*3/uL (ref 0.0–0.1)
Basophils Relative: 0 %
Eosinophils Absolute: 0.3 10*3/uL (ref 0.0–0.5)
Eosinophils Relative: 3 %
HCT: 24.1 % — ABNORMAL LOW (ref 39.0–52.0)
Hemoglobin: 7.9 g/dL — ABNORMAL LOW (ref 13.0–17.0)
Immature Granulocytes: 1 %
Lymphocytes Relative: 7 %
Lymphs Abs: 0.7 10*3/uL (ref 0.7–4.0)
MCH: 28.7 pg (ref 26.0–34.0)
MCHC: 32.8 g/dL (ref 30.0–36.0)
MCV: 87.6 fL (ref 80.0–100.0)
Monocytes Absolute: 1.4 10*3/uL — ABNORMAL HIGH (ref 0.1–1.0)
Monocytes Relative: 14 %
Neutro Abs: 7.8 10*3/uL — ABNORMAL HIGH (ref 1.7–7.7)
Neutrophils Relative %: 75 %
Platelets: 241 10*3/uL (ref 150–400)
RBC: 2.75 MIL/uL — ABNORMAL LOW (ref 4.22–5.81)
RDW: 15.7 % — ABNORMAL HIGH (ref 11.5–15.5)
WBC: 10.4 10*3/uL (ref 4.0–10.5)
nRBC: 0 % (ref 0.0–0.2)

## 2019-12-16 LAB — RENAL FUNCTION PANEL
Albumin: 2.4 g/dL — ABNORMAL LOW (ref 3.5–5.0)
Anion gap: 11 (ref 5–15)
BUN: 24 mg/dL — ABNORMAL HIGH (ref 8–23)
CO2: 24 mmol/L (ref 22–32)
Calcium: 7.9 mg/dL — ABNORMAL LOW (ref 8.9–10.3)
Chloride: 103 mmol/L (ref 98–111)
Creatinine, Ser: 2.27 mg/dL — ABNORMAL HIGH (ref 0.61–1.24)
GFR calc Af Amer: 32 mL/min — ABNORMAL LOW (ref >60)
GFR calc non Af Amer: 27 mL/min — ABNORMAL LOW (ref >60)
Glucose, Bld: 98 mg/dL (ref 70–99)
Phosphorus: 2.7 mg/dL (ref 2.5–4.6)
Potassium: 4.3 mmol/L (ref 3.5–5.1)
Sodium: 138 mmol/L (ref 135–145)

## 2019-12-16 LAB — MAGNESIUM: Magnesium: 1.6 mg/dL — ABNORMAL LOW (ref 1.7–2.4)

## 2019-12-16 MED ORDER — AMLODIPINE BESYLATE 10 MG PO TABS
10.0000 mg | ORAL_TABLET | Freq: Every day | ORAL | Status: DC
  Administered 2019-12-17 – 2019-12-20 (×4): 10 mg via ORAL
  Filled 2019-12-16 (×4): qty 1

## 2019-12-16 MED ORDER — HYDRALAZINE HCL 25 MG PO TABS: 25.0000 mg | ORAL_TABLET | Freq: Three times a day (TID) | ORAL | Status: DC | PRN

## 2019-12-16 MED ORDER — MAGNESIUM OXIDE 400 (241.3 MG) MG PO TABS
400.0000 mg | ORAL_TABLET | Freq: Two times a day (BID) | ORAL | Status: DC
  Administered 2019-12-16 – 2019-12-20 (×9): 400 mg via ORAL
  Filled 2019-12-16 (×9): qty 1

## 2019-12-16 NOTE — Plan of Care (Signed)
  Problem: Education: Goal: Knowledge of General Education information will improve Description: Including pain rating scale, medication(s)/side effects and non-pharmacologic comfort measures Outcome: Progressing   Problem: Health Behavior/Discharge Planning: Goal: Ability to manage health-related needs will improve Outcome: Progressing   Problem: Clinical Measurements: Goal: Will remain free from infection Outcome: Progressing   Problem: Activity: Goal: Risk for activity intolerance will decrease Outcome: Progressing   Problem: Elimination: Goal: Will not experience complications related to bowel motility Outcome: Progressing   Problem: Pain Managment: Goal: General experience of comfort will improve Outcome: Progressing   Problem: Safety: Goal: Ability to remain free from injury will improve Outcome: Progressing   Problem: Skin Integrity: Goal: Risk for impaired skin integrity will decrease Outcome: Progressing   Problem: Education: Goal: Verbalization of understanding the information provided (i.e., activity precautions, restrictions, etc) will improve Outcome: Progressing   Problem: Activity: Goal: Ability to ambulate and perform ADLs will improve Outcome: Progressing   Problem: Clinical Measurements: Goal: Postoperative complications will be avoided or minimized Outcome: Progressing

## 2019-12-16 NOTE — Progress Notes (Signed)
[name]  PROGRESS NOTE    HALLIE KOUPAL  [id number] DOB: [birth year] DOA: 12/11/2019 PCP: Vivi Barrack, MD   Brief Narrative:   [name] a 74 y.o.malewith medical history significant ofHTN, CVA with residual right hemiparesis in 2010 on Plavix, schizophrenia, and psychosis.Patient was just mumbling most times when I talked to him,also have history obtained from patient history and ED staff.according to sister [name], whom he lives withpatient baseline usesa caneto ambulate. Hefell down2 days ago, trying to get out of bed,and the patient stayed on the floor for 2 hours before he coulduse some help from neighbor.Since then patient refused to get out of bed and walk again,continue to complain about the right leg pain and " all locked up of right hip". He tried to get up and put weight on his right leg again today and fell a second time. [name] called EMS. She states that the leg has been swollen for "a while now" but seemed more swollen today.riage nurse, EMS came to his home and found him to be clammy, fatigued, and lethargic and brought him to the ED. When patient was questioned, he states that he "just hit the floor" and "maybe he was mopping the floor". When asked other questions, he became somnolent and did not answer.  12/16/19: No acute events ON. PT recs SNF, but family would like to take home. Would need 24hr supervision for discharge to home. If family is able to provide that, then we could consider home with HHPT "safe". At this point, it is not clear that could be provided. So we are not at a safe discharge point. Continue current Tx.    Assessment & Plan:   Active Problems:   Anemia   Hyperlipidemia   Benign hypertension with CKD (chronic kidney disease) stage III   Schizophrenia (HCC)   Closed displaced intertrochanteric fracture of right femur (HCC)   LFT elevation   AKI (acute kidney injury) ([place])  Right closed intertrochanteric fracture right femur -  s/p R femoral IMN - PT/OT per orthopedic recommendations - pain control with oxycodone and gabapentin - senokot-docusate  AKI - admission SCr 3.22 trending down to 2.27today - continue fluids - watch nephrotoxins - has CKD w/ an unknown baseline, can not be more specific that CKD 3 - 4; need outside records  Normocytic Anemia Iron deficiency - Likely some dilutional component 8.7 on admit to 7.1 12/12/19 - s/p 2 units PRBC prior to OR after consult with orthopedics - anemia panel 12/11/19 iron 28, ferritin 131, B12 754; Add ferreheme? - Hgb is 7.9this AM, monitor - no signs of frank bleed  Elevated LFTs - could be from acute trauma - continues to trend down - exam normal, continue to monitor  HTN - continue lisinopril, metoprolol, amlodipine - titrate as necessary     - can increase amlodipine to 10     - PRN hydralazine  Hyperlipidemia - continue home lipitor; monitor LFTs  Hypokalemia Hypomagnesemia Hypophosphatemia - stop, K+; add Mg2+, phos ok  DVT prophylaxis: lovenox Code Status: FULL   Disposition Plan: TBD  Consultants:   Orthopedics   ROS:  Denies CP, N, V, ab pain, HA, dyspnea . Remainder 10-pt ROS is negative for all not previously mentioned.  Subjective: "No... No, i'm fine."  Objective: Vitals:   12/15/19 1322 12/15/19 1939 12/16/19 0332 12/16/19 0744  BP: (!) 160/85 (!) 146/75 (!) 156/70 (!) 173/77  Pulse: 73 78 75 81  Resp: 18 16  18   Temp:  100.3 F (37.9 C) 98.6 F (37 C) 98.4 F (36.9 C) 98.7 F (37.1 C)  TempSrc: Oral Oral Oral   SpO2: 100% 99% 100% 100%  Weight:      Height:        Intake/Output Summary (Last 24 hours) at 12/16/2019 1254 Last data filed at 12/16/2019 0900 Gross per 24 hour  Intake 1080 ml  Output 800 ml  Net 280 ml   Filed Weights   12/11/19 2119  Weight: 67.5 kg    Examination:  General: 74 y.o. male resting in bed in  NAD Cardiovascular: RRR, +S1, S2, no m/g/r Respiratory: CTABL, no w/r/r, normal WOB GI: BS+, NDNT, soft, no masses noted, no organomegaly noted MSK: No e/c/c Neuro: alert to name, follows commands, good UE str b/l, good LE str b/l Psyc: calm/cooperative   Data Reviewed: I have personally reviewed following labs and imaging studies.  CBC: Recent Labs  Lab 12/11/19 1410 12/12/19 0009 12/12/19 1941 12/13/19 0448 12/14/19 0322 12/15/19 0812 12/16/19 0404  WBC 13.4* 10.2  --  9.1 9.4 11.1* 10.4  NEUTROABS 10.9*  --   --   --  6.6 8.4* 7.8*  HGB 8.7* 7.1* 9.7* 8.5* 8.1* 8.3* 7.9*  HCT 26.2* 20.8* 28.9* 25.8* 24.2* 24.8* 24.1*  MCV 92.3 90.8  --  88.7 87.7 87.6 87.6  PLT 211 195  --  173 192 220 [id number]   Basic Metabolic Panel: Recent Labs  Lab 12/12/19 0009 12/13/19 0448 12/14/19 0322 12/15/19 0812 12/16/19 0404  NA 140 141 139 135 138  K 3.4* 3.4* 3.3* 3.7 4.3  CL 104 105 104 102 103  CO2 24 25 24 23 24   GLUCOSE 122* 107* 101* 137* 98  BUN 51* 39* 32* 26* 24*  CREATININE 2.93* 2.50* 2.32* 2.36* 2.27*  CALCIUM 7.9* 7.5* 7.4* 7.4* 7.9*  MG  --   --  1.4*  --  1.6*  PHOS  --   --  2.1* 2.8 2.7   GFR: Estimated Creatinine Clearance: 27.3 mL/min (A) (by C-G formula based on SCr of 2.27 mg/dL (H)). Liver Function Tests: Recent Labs  Lab 12/11/19 1410 12/12/19 0009 12/13/19 0448 12/14/19 0322 12/15/19 0812 12/16/19 0404  AST 168* 121* 69*  --   --   --   ALT 73* 57* 35  --   --   --   ALKPHOS 60 47 43  --   --   --   BILITOT 1.7* 1.2 0.7  --   --   --   PROT 7.7 6.0* 5.6*  --   --   --   ALBUMIN 4.0 3.2* 2.6* 2.5* 2.4* 2.4*   No results for input(s): LIPASE, AMYLASE in the last 168 hours. Recent Labs  Lab 12/12/19 0009  AMMONIA 40*   Coagulation Profile: No results for input(s): INR, PROTIME in the last 168 hours. Cardiac Enzymes: No results for input(s): CKTOTAL, CKMB, CKMBINDEX, TROPONINI in the last 168 hours. BNP (last 3 results) No results for  input(s): PROBNP in the last 8760 hours. HbA1C: No results for input(s): HGBA1C in the last 72 hours. CBG: No results for input(s): GLUCAP in the last 168 hours. Lipid Profile: No results for input(s): CHOL, HDL, LDLCALC, TRIG, CHOLHDL, LDLDIRECT in the last 72 hours. Thyroid Function Tests: No results for input(s): TSH, T4TOTAL, FREET4, T3FREE, THYROIDAB in the last 72 hours. Anemia Panel: No results for input(s): VITAMINB12, FOLATE, FERRITIN, TIBC, IRON, RETICCTPCT in the last 72 hours. Sepsis Labs: No results for  input(s): PROCALCITON, LATICACIDVEN in the last 168 hours.  Recent Results (from the past 240 hour(s))  Surgical pcr screen     Status: Abnormal   Collection Time: 12/11/19  9:24 PM   Specimen: Nasal Mucosa; Nasal Swab  Result Value Ref Range Status   MRSA, PCR NEGATIVE NEGATIVE Final   Staphylococcus aureus POSITIVE (A) NEGATIVE Final    Comment: (NOTE) The Xpert SA Assay (FDA approved for NASAL specimens in patients 10 years of age and older), is one component of a comprehensive surveillance program. It is not intended to diagnose infection nor to guide or monitor treatment. Performed at [facility], [place] [address], [place], [place] [address]   SARS CORONAVIRUS 2 (TAT 6-24 HRS) Nasopharyngeal Nasopharyngeal Swab     Status: None   Collection Time: 12/11/19  9:45 PM   Specimen: Nasopharyngeal Swab  Result Value Ref Range Status   SARS Coronavirus 2 NEGATIVE NEGATIVE Final    Comment: (NOTE) SARS-CoV-2 target nucleic acids are NOT DETECTED. The SARS-CoV-2 RNA is generally detectable in upper and lower respiratory specimens during the acute phase of infection. Negative results do not preclude SARS-CoV-2 infection, do not rule out co-infections with other pathogens, and should not be used as the sole basis for treatment or other patient management decisions. Negative results must be combined with clinical observations, patient history, and epidemiological  information. The expected result is Negative. Fact Sheet for Patients: SugarRoll.be Fact Sheet for Healthcare Providers: https://www.woods-mathews.com/ This test is not yet approved or cleared by the [place] FDA and  has been authorized for detection and/or diagnosis of SARS-CoV-2 by FDA under an Emergency Use Authorization (EUA). This EUA will remain  in effect (meaning this test can be used) for the duration of the COVID-19 declaration under Section 56 4(b)(1) of the Act, 21 U.S.C. section 360bbb-3(b)(1), unless the authorization is terminated or revoked sooner. Performed at [facility], [place] [address] [place], [place] [address]       Radiology Studies: CT HEAD WO CONTRAST  Result Date: 12/15/2019 CLINICAL DATA:  Left-sided weakness. EXAM: CT HEAD WITHOUT CONTRAST TECHNIQUE: Contiguous axial images were obtained from the base of the skull through the vertex without intravenous contrast. COMPARISON:  Head CT scan 12/11/2019.  Brain MRI 07/31/2009. FINDINGS: Brain: No evidence of acute infarction, hemorrhage, hydrocephalus, extra-axial collection or mass lesion/mass effect. Remote left ACA and left posterior watershed territory infarcts again seen. Atrophy and chronic microvascular ischemic change noted. Vascular: Atherosclerosis. Skull: No fracture. Small area of increased density in the left sphenoid wing is noted as per seen on the prior head CT. Sinuses/Orbits: Mild mucosal thickening left maxillary sinus. Other: None. IMPRESSION: No acute abnormality or change compared to the prior exam. Atrophy, chronic microvascular ischemic change remote left-sided infarcts. Atherosclerosis. Mild mucosal thickening left maxillary sinus. Electronically Signed   By: Inge Rise M.D.   On: 12/15/2019 12:50     Scheduled Meds: . sodium chloride   Intravenous Once  . amLODipine  5 mg Oral Daily  . atorvastatin  40 mg Oral Daily  . enoxaparin  (LOVENOX) injection  30 mg Subcutaneous Q24H  . folic acid  1 mg Oral Daily  . gabapentin  100 mg Oral QHS  . lisinopril  20 mg Oral Daily  . magnesium oxide  400 mg Oral BID  . metoprolol tartrate  100 mg Oral BID  . multivitamin with minerals  1 tablet Oral Daily  . mupirocin ointment  1 application Nasal BID  .  potassium chloride  40 mEq Oral Daily   Continuous Infusions: . sodium chloride 250 mL (12/12/19 1146)     LOS: 5 days    Time spent: 25 minutes spent in the coordination of care today.    Jonnie Finner, DO [facility]  If 7PM-7AM, please contact night-coverage [email or web address] 12/16/2019, 12:54 PM

## 2019-12-16 NOTE — Plan of Care (Signed)
  Problem: Clinical Measurements: Goal: Ability to maintain clinical measurements within normal limits will improve Outcome: Progressing   Problem: Activity: Goal: Risk for activity intolerance will decrease Outcome: Progressing   Problem: Safety: Goal: Ability to remain free from injury will improve Outcome: Progressing   Problem: Skin Integrity: Goal: Risk for impaired skin integrity will decrease Outcome: Progressing

## 2019-12-17 LAB — CBC WITH DIFFERENTIAL/PLATELET
Abs Immature Granulocytes: 0.09 10*3/uL — ABNORMAL HIGH (ref 0.00–0.07)
Basophils Absolute: 0 10*3/uL (ref 0.0–0.1)
Basophils Relative: 0 %
Eosinophils Absolute: 0.1 10*3/uL (ref 0.0–0.5)
Eosinophils Relative: 0 %
HCT: 24.7 % — ABNORMAL LOW (ref 39.0–52.0)
Hemoglobin: 8.2 g/dL — ABNORMAL LOW (ref 13.0–17.0)
Immature Granulocytes: 1 %
Lymphocytes Relative: 5 %
Lymphs Abs: 0.8 10*3/uL (ref 0.7–4.0)
MCH: 28.8 pg (ref 26.0–34.0)
MCHC: 33.2 g/dL (ref 30.0–36.0)
MCV: 86.7 fL (ref 80.0–100.0)
Monocytes Absolute: 1.6 10*3/uL — ABNORMAL HIGH (ref 0.1–1.0)
Monocytes Relative: 12 %
Neutro Abs: 11.5 10*3/uL — ABNORMAL HIGH (ref 1.7–7.7)
Neutrophils Relative %: 82 %
Platelets: 265 10*3/uL (ref 150–400)
RBC: 2.85 MIL/uL — ABNORMAL LOW (ref 4.22–5.81)
RDW: 15.1 % (ref 11.5–15.5)
WBC: 14.1 10*3/uL — ABNORMAL HIGH (ref 4.0–10.5)
nRBC: 0 % (ref 0.0–0.2)

## 2019-12-17 LAB — RENAL FUNCTION PANEL
Albumin: 2.4 g/dL — ABNORMAL LOW (ref 3.5–5.0)
Anion gap: 11 (ref 5–15)
BUN: 25 mg/dL — ABNORMAL HIGH (ref 8–23)
CO2: 24 mmol/L (ref 22–32)
Calcium: 8.2 mg/dL — ABNORMAL LOW (ref 8.9–10.3)
Chloride: 102 mmol/L (ref 98–111)
Creatinine, Ser: 2.33 mg/dL — ABNORMAL HIGH (ref 0.61–1.24)
GFR calc Af Amer: 31 mL/min — ABNORMAL LOW (ref >60)
GFR calc non Af Amer: 27 mL/min — ABNORMAL LOW (ref >60)
Glucose, Bld: 105 mg/dL — ABNORMAL HIGH (ref 70–99)
Phosphorus: 3 mg/dL (ref 2.5–4.6)
Potassium: 4.4 mmol/L (ref 3.5–5.1)
Sodium: 137 mmol/L (ref 135–145)

## 2019-12-17 LAB — MAGNESIUM: Magnesium: 1.8 mg/dL (ref 1.7–2.4)

## 2019-12-17 MED ORDER — HYDRALAZINE HCL 25 MG PO TABS
25.0000 mg | ORAL_TABLET | Freq: Three times a day (TID) | ORAL | Status: DC
  Administered 2019-12-17 – 2019-12-19 (×6): 25 mg via ORAL
  Filled 2019-12-17 (×6): qty 1

## 2019-12-17 NOTE — Plan of Care (Signed)
  Problem: Education: Goal: Knowledge of General Education information will improve Description: Including pain rating scale, medication(s)/side effects and non-pharmacologic comfort measures Outcome: Progressing   Problem: Health Behavior/Discharge Planning: Goal: Ability to manage health-related needs will improve Outcome: Progressing   Problem: Clinical Measurements: Goal: Will remain free from infection Outcome: Progressing   Problem: Activity: Goal: Risk for activity intolerance will decrease Outcome: Progressing   Problem: Pain Managment: Goal: General experience of comfort will improve Outcome: Progressing   Problem: Safety: Goal: Ability to remain free from injury will improve Outcome: Progressing   Problem: Skin Integrity: Goal: Risk for impaired skin integrity will decrease Outcome: Progressing   Problem: Education: Goal: Verbalization of understanding the information provided (i.e., activity precautions, restrictions, etc) will improve Outcome: Progressing   Problem: Activity: Goal: Ability to ambulate and perform ADLs will improve Outcome: Progressing   Problem: Clinical Measurements: Goal: Postoperative complications will be avoided or minimized Outcome: Progressing

## 2019-12-17 NOTE — Progress Notes (Signed)
Physical Therapy Treatment Patient Details Name: Daniel [name] MRN: [id number] DOB: 05-01-1946 Today's Date: 12/17/2019    History of Present Illness 74 yo s/p IM nail r femoral on 1/5 and R non displaced ankle fx after fall at home. PMH HTN CVA with R hemiparesis 2010, schizophrenia and psychosis    PT Comments    Continuing work on functional mobility and activity tolerance;  Significant improvements in functional mobility and activity tolerance compared to previous session; abel to perform basic pivot to L side to chair and then sit to stand with heavy moderate (almost maximal) assist of 2;   Noted in [facility] note that Daniel [name] family would very much like for him to dc home; While at this point he still is more than appropriate for post-acute rehab at SNF, there is improvement, and I saw that he can have 24hour reliable assist at home; DC home is worth consideration -- he would need:    24 hour +2 assist  HHPT/OT/Aide  Hospital Bed  [place] [facility] transport home  Wheelchair    Follow Up Recommendations  SNF;Supervision/Assistance - 24 hour  See above for home considerations     Equipment Recommendations  Rolling walker with 5" wheels; and see above   Recommendations for Other Services       Precautions / Restrictions Precautions Precautions: Fall Required Braces or Orthoses: Other Brace Other Brace: Cam boot RLE Restrictions RLE Weight Bearing: Weight bearing as tolerated    Mobility  Bed Mobility Overal bed mobility: Needs Assistance Bed Mobility: Supine to Sit     Supine to sit: Mod assist;+2 for physical assistance     General bed mobility comments: Cues for technique; handheld assist to pul to sit after clearing RLE from EOB; cues and mod assist to square off hips at eOB  Transfers Overall transfer level: Needs assistance Equipment used: 2 person hand held assist;Rolling walker (2 wheeled) Transfers: Stand Pivot Transfers;Sit  to/from Stand Sit to Stand: +2 physical assistance;Mod assist Stand pivot transfers: +2 physical assistance;Mod assist       General transfer comment: Noting much improved weight acceptance on both LEs during basic pivot transfer and sit to stand from recliner; Heavy mod assist in both transfers  Ambulation/Gait                 Stairs             Wheelchair Mobility    Modified Rankin (Stroke Patients Only)       Balance Overall balance assessment: Needs assistance   Sitting balance-Leahy Scale: Fair       Standing balance-Leahy Scale: Poor Standing balance comment: stood to RW for approx 10-20 seconds; hips and trunk flexed, but notable improvements in weight acceptance BELs in standing                            Cognition Arousal/Alertness: Awake/alert Behavior During Therapy: WFL for tasks assessed/performed Overall Cognitive Status: Within Functional Limits for tasks assessed(for simple mobility)                                 General Comments: Participating well, appears improved over last session      Exercises      General Comments        Pertinent Vitals/Pain Pain Assessment: Faces Faces Pain Scale: Hurts little more Pain Location:  R foot Pain Descriptors / Indicators: Grimacing;Guarding Pain Intervention(s): Monitored during session    Home Living                      Prior Function            PT Goals (current goals can now be found in the care plan section) Acute Rehab PT Goals Patient Stated Goal: to return home PT Goal Formulation: With patient Time For Goal Achievement: 12/27/19 Potential to Achieve Goals: Fair Progress towards PT goals: Progressing toward goals    Frequency    Min 3X/week      PT Plan Current plan remains appropriate    Co-evaluation              AM-PAC PT "6 Clicks" Mobility   Outcome Measure  Help needed turning from your back to your side while in  a flat bed without using bedrails?: A Lot Help needed moving from lying on your back to sitting on the side of a flat bed without using bedrails?: A Lot Help needed moving to and from a bed to a chair (including a wheelchair)?: A Lot Help needed standing up from a chair using your arms (e.g., wheelchair or bedside chair)?: A Lot Help needed to walk in hospital room?: A Lot Help needed climbing 3-5 steps with a railing? : Total 6 Click Score: 11    End of Session Equipment Utilized During Treatment: Gait belt Activity Tolerance: Patient tolerated treatment well Patient left: in chair;with call bell/phone within reach;with chair alarm set Nurse Communication: Mobility status;Need for lift equipment PT Visit Diagnosis: Unsteadiness on feet (R26.81);Difficulty in walking, not elsewhere classified (R26.2);Muscle weakness (generalized) (M62.81);Hemiplegia and hemiparesis;Pain Hemiplegia - Right/Left: Right Hemiplegia - dominant/non-dominant: Dominant Hemiplegia - caused by: Unspecified Pain - Right/Left: Right(both) Pain - part of body: Leg     Time: [id number] PT Time Calculation (min) (ACUTE ONLY): 23 min  Charges:  $Therapeutic Activity: 23-37 mins                     Roney Marion, PT  Acute Rehabilitation Services Pager [phone] Office [place] 12/17/2019, 7:21 PM

## 2019-12-17 NOTE — Progress Notes (Signed)
[name]  PROGRESS NOTE    RACHEL MUNETON  [id number] DOB: 1946/10/29 DOA: 12/11/2019 PCP: Vivi Barrack, MD   Brief Narrative:   [name] a 74 y.o.malewith medical history significant ofHTN, CVA with residual right hemiparesis in 2010 on Plavix, schizophrenia, and psychosis.Patient was just mumbling most times when I talked to him,also have history obtained from patient history and ED staff.according to sister [name], whom he lives withpatient baseline usesa caneto ambulate. Hefell down2 days ago, trying to get out of bed,and the patient stayed on the floor for 2 hours before he coulduse some help from neighbor.Since then patient refused to get out of bed and walk again,continue to complain about the right leg pain and " all locked up of right hip". He tried to get up and put weight on his right leg again today and fell a second time. [name] called EMS. She states that the leg has been swollen for "a while now" but seemed more swollen today.riage nurse, EMS came to his home and found him to be clammy, fatigued, and lethargic and brought him to the ED. When patient was questioned, he states that he "just hit the floor" and "maybe he was mopping the floor". When asked other questions, he became somnolent and did not answer.  12/17/19: WBC rising, but remains afebrile. Denies complaints. Instructed on use of IS. Needs to be mobile with assistance. Working on SNF vs home with HHPT/family if 24-hr supervision can be provided.  Assessment & Plan:   Active Problems:   Anemia   Hyperlipidemia   Benign hypertension with CKD (chronic kidney disease) stage III   Schizophrenia (HCC)   Closed displaced intertrochanteric fracture of right femur (HCC)   LFT elevation   AKI (acute kidney injury) ([place])  Right closed intertrochanteric fracture right femur - s/p R femoral IMN - PT/OT per orthopedic recommendations - pain control with oxycodone and gabapentin -  senokot-docusate  AKI - admission SCr 3.22 - continue fluids - watch nephrotoxins - has CKD w/ an unknown baseline, can not be more specific that CKD 3 - 4; need outside records     - Scr 2.33 this AM; monitor  Normocytic Anemia Iron deficiency - Likely some dilutional component 8.7 on admit to 7.1 12/12/19 - s/p 2 units PRBC prior to OR after consult with orthopedics - anemia panel 12/11/19 iron 28, ferritin 131, B12 754; Add ferreheme?     - 12/17/19: Hgb 8.2 this AM, no evidence of frank bleed  Elevated LFTs - could be from acute trauma - exam normal; monitor  HTN - continue lisinopril, metoprolol, amlodipine - titrate as necessary     - PRN hydralazine     - can schedule hydralazine  Hyperlipidemia - continue home lipitor; monitor LFTs  Hypokalemia Hypomagnesemia Hypophosphatemia -replaced, monitor  DVT prophylaxis: lovenox Code Status: FULL   Disposition Plan: TBD  Consultants:   Orthopedics  ROS:  Denies dyspnea, ab pain, N, CP . Remainder 10-pt ROS is negative for all not previously mentioned.  Subjective: "They told me to look at the numbers."  Objective: Vitals:   12/16/19 1540 12/16/19 1939 12/17/19 0310 12/17/19 0752  BP: (!) 168/78 (!) 156/72 (!) 157/86 (!) 163/68  Pulse: 91 91 90 91  Resp: 17 15 15 16   Temp: 98.2 F (36.8 C) 99.9 F (37.7 C) 98.9 F (37.2 C) 98.6 F (37 C)  TempSrc: Oral Oral Oral Oral  SpO2: 100% 99% 100% 99%  Weight:  Height:        Intake/Output Summary (Last 24 hours) at 12/17/2019 1147 Last data filed at 12/17/2019 0900 Gross per 24 hour  Intake 480 ml  Output 2500 ml  Net -2020 ml   Filed Weights   12/11/19 2119  Weight: 67.5 kg    Examination:  General: 74 y.o. male resting in bed in NAD Cardiovascular: RRR, +S1, S2, no m/g/r Respiratory: CTABL, no w/r/r, working with IS GI: BS+, NDNT, no masses noted, no organomegaly noted MSK: No  e/c/c Neuro: alert to name, follows commands Psyc: calm/cooperative   Data Reviewed: I have personally reviewed following labs and imaging studies.  CBC: Recent Labs  Lab 12/11/19 1410 12/13/19 0448 12/14/19 0322 12/15/19 0812 12/16/19 0404 12/17/19 0322  WBC 13.4* 9.1 9.4 11.1* 10.4 14.1*  NEUTROABS 10.9*  --  6.6 8.4* 7.8* 11.5*  HGB 8.7* 8.5* 8.1* 8.3* 7.9* 8.2*  HCT 26.2* 25.8* 24.2* 24.8* 24.1* 24.7*  MCV 92.3 88.7 87.7 87.6 87.6 86.7  PLT 211 173 192 220 241 [id number]   Basic Metabolic Panel: Recent Labs  Lab 12/13/19 0448 12/14/19 0322 12/15/19 0812 12/16/19 0404 12/17/19 0322  NA 141 139 135 138 137  K 3.4* 3.3* 3.7 4.3 4.4  CL 105 104 102 103 102  CO2 25 24 23 24 24   GLUCOSE 107* 101* 137* 98 105*  BUN 39* 32* 26* 24* 25*  CREATININE 2.50* 2.32* 2.36* 2.27* 2.33*  CALCIUM 7.5* 7.4* 7.4* 7.9* 8.2*  MG  --  1.4*  --  1.6* 1.8  PHOS  --  2.1* 2.8 2.7 3.0   GFR: Estimated Creatinine Clearance: 26.6 mL/min (A) (by C-G formula based on SCr of 2.33 mg/dL (H)). Liver Function Tests: Recent Labs  Lab 12/11/19 1410 12/12/19 0009 12/13/19 0448 12/14/19 0322 12/15/19 0812 12/16/19 0404 12/17/19 0322  AST 168* 121* 69*  --   --   --   --   ALT 73* 57* 35  --   --   --   --   ALKPHOS 60 47 43  --   --   --   --   BILITOT 1.7* 1.2 0.7  --   --   --   --   PROT 7.7 6.0* 5.6*  --   --   --   --   ALBUMIN 4.0 3.2* 2.6* 2.5* 2.4* 2.4* 2.4*   No results for input(s): LIPASE, AMYLASE in the last 168 hours. Recent Labs  Lab 12/12/19 0009  AMMONIA 40*   Coagulation Profile: No results for input(s): INR, PROTIME in the last 168 hours. Cardiac Enzymes: No results for input(s): CKTOTAL, CKMB, CKMBINDEX, TROPONINI in the last 168 hours. BNP (last 3 results) No results for input(s): PROBNP in the last 8760 hours. HbA1C: No results for input(s): HGBA1C in the last 72 hours. CBG: No results for input(s): GLUCAP in the last 168 hours. Lipid Profile: No results for  input(s): CHOL, HDL, LDLCALC, TRIG, CHOLHDL, LDLDIRECT in the last 72 hours. Thyroid Function Tests: No results for input(s): TSH, T4TOTAL, FREET4, T3FREE, THYROIDAB in the last 72 hours. Anemia Panel: No results for input(s): VITAMINB12, FOLATE, FERRITIN, TIBC, IRON, RETICCTPCT in the last 72 hours. Sepsis Labs: No results for input(s): PROCALCITON, LATICACIDVEN in the last 168 hours.  Recent Results (from the past 240 hour(s))  Surgical pcr screen     Status: Abnormal   Collection Time: 12/11/19  9:24 PM   Specimen: Nasal Mucosa; Nasal Swab  Result Value Ref Range Status  MRSA, PCR NEGATIVE NEGATIVE Final   Staphylococcus aureus POSITIVE (A) NEGATIVE Final    Comment: (NOTE) The Xpert SA Assay (FDA approved for NASAL specimens in patients 24 years of age and older), is one component of a comprehensive surveillance program. It is not intended to diagnose infection nor to guide or monitor treatment. Performed at [facility], [place] [address] [place], [place] [address]   SARS CORONAVIRUS 2 (TAT 6-24 HRS) Nasopharyngeal Nasopharyngeal Swab     Status: None   Collection Time: 12/11/19  9:45 PM   Specimen: Nasopharyngeal Swab  Result Value Ref Range Status   SARS Coronavirus 2 NEGATIVE NEGATIVE Final    Comment: (NOTE) SARS-CoV-2 target nucleic acids are NOT DETECTED. The SARS-CoV-2 RNA is generally detectable in upper and lower respiratory specimens during the acute phase of infection. Negative results do not preclude SARS-CoV-2 infection, do not rule out co-infections with other pathogens, and should not be used as the sole basis for treatment or other patient management decisions. Negative results must be combined with clinical observations, patient history, and epidemiological information. The expected result is Negative. Fact Sheet for Patients: SugarRoll.be Fact Sheet for Healthcare  Providers: https://www.woods-mathews.com/ This test is not yet approved or cleared by the [place] FDA and  has been authorized for detection and/or diagnosis of SARS-CoV-2 by FDA under an Emergency Use Authorization (EUA). This EUA will remain  in effect (meaning this test can be used) for the duration of the COVID-19 declaration under Section 56 4(b)(1) of the Act, 21 U.S.C. section 360bbb-3(b)(1), unless the authorization is terminated or revoked sooner. Performed at [facility], [place] [address], [place], [place] [address]       Radiology Studies: CT HEAD WO CONTRAST  Result Date: 12/15/2019 CLINICAL DATA:  Left-sided weakness. EXAM: CT HEAD WITHOUT CONTRAST TECHNIQUE: Contiguous axial images were obtained from the base of the skull through the vertex without intravenous contrast. COMPARISON:  Head CT scan 12/11/2019.  Brain MRI 07/31/2009. FINDINGS: Brain: No evidence of acute infarction, hemorrhage, hydrocephalus, extra-axial collection or mass lesion/mass effect. Remote left ACA and left posterior watershed territory infarcts again seen. Atrophy and chronic microvascular ischemic change noted. Vascular: Atherosclerosis. Skull: No fracture. Small area of increased density in the left sphenoid wing is noted as per seen on the prior head CT. Sinuses/Orbits: Mild mucosal thickening left maxillary sinus. Other: None. IMPRESSION: No acute abnormality or change compared to the prior exam. Atrophy, chronic microvascular ischemic change remote left-sided infarcts. Atherosclerosis. Mild mucosal thickening left maxillary sinus. Electronically Signed   By: Inge Rise M.D.   On: 12/15/2019 12:50     Scheduled Meds: . sodium chloride   Intravenous Once  . amLODipine  10 mg Oral Daily  . atorvastatin  40 mg Oral Daily  . enoxaparin (LOVENOX) injection  30 mg Subcutaneous Q24H  . folic acid  1 mg Oral Daily  . gabapentin  100 mg Oral QHS  . lisinopril  20 mg Oral Daily   . magnesium oxide  400 mg Oral BID  . metoprolol tartrate  100 mg Oral BID  . multivitamin with minerals  1 tablet Oral Daily   Continuous Infusions: . sodium chloride 250 mL (12/12/19 1146)     LOS: 6 days    Time spent: 25 minutes spent in the coordination of care today.    Jonnie Finner, DO [facility]  If 7PM-7AM, please contact night-coverage [email or web address] 12/17/2019, 11:47 AM

## 2019-12-17 NOTE — Plan of Care (Signed)
  Problem: Safety: Goal: Ability to remain free from injury will improve Outcome: Progressing   Problem: Skin Integrity: Goal: Risk for impaired skin integrity will decrease Outcome: Progressing   Problem: Activity: Goal: Ability to ambulate and perform ADLs will improve Outcome: Progressing   

## 2019-12-18 LAB — COMPREHENSIVE METABOLIC PANEL
ALT: 15 U/L (ref 0–44)
AST: 39 U/L (ref 15–41)
Albumin: 2.3 g/dL — ABNORMAL LOW (ref 3.5–5.0)
Alkaline Phosphatase: 60 U/L (ref 38–126)
Anion gap: 9 (ref 5–15)
BUN: 32 mg/dL — ABNORMAL HIGH (ref 8–23)
CO2: 24 mmol/L (ref 22–32)
Calcium: 8 mg/dL — ABNORMAL LOW (ref 8.9–10.3)
Chloride: 104 mmol/L (ref 98–111)
Creatinine, Ser: 2.37 mg/dL — ABNORMAL HIGH (ref 0.61–1.24)
GFR calc Af Amer: 30 mL/min — ABNORMAL LOW (ref >60)
GFR calc non Af Amer: 26 mL/min — ABNORMAL LOW (ref >60)
Glucose, Bld: 99 mg/dL (ref 70–99)
Potassium: 4.1 mmol/L (ref 3.5–5.1)
Sodium: 137 mmol/L (ref 135–145)
Total Bilirubin: 0.6 mg/dL (ref 0.3–1.2)
Total Protein: 5.5 g/dL — ABNORMAL LOW (ref 6.5–8.1)

## 2019-12-18 LAB — CBC WITH DIFFERENTIAL/PLATELET
Abs Immature Granulocytes: 0.06 10*3/uL (ref 0.00–0.07)
Basophils Absolute: 0 10*3/uL (ref 0.0–0.1)
Basophils Relative: 0 %
Eosinophils Absolute: 0.4 10*3/uL (ref 0.0–0.5)
Eosinophils Relative: 3 %
HCT: 24.3 % — ABNORMAL LOW (ref 39.0–52.0)
Hemoglobin: 8.1 g/dL — ABNORMAL LOW (ref 13.0–17.0)
Immature Granulocytes: 1 %
Lymphocytes Relative: 8 %
Lymphs Abs: 0.9 10*3/uL (ref 0.7–4.0)
MCH: 28.9 pg (ref 26.0–34.0)
MCHC: 33.3 g/dL (ref 30.0–36.0)
MCV: 86.8 fL (ref 80.0–100.0)
Monocytes Absolute: 1.5 10*3/uL — ABNORMAL HIGH (ref 0.1–1.0)
Monocytes Relative: 13 %
Neutro Abs: 8.7 10*3/uL — ABNORMAL HIGH (ref 1.7–7.7)
Neutrophils Relative %: 75 %
Platelets: 266 10*3/uL (ref 150–400)
RBC: 2.8 MIL/uL — ABNORMAL LOW (ref 4.22–5.81)
RDW: 15 % (ref 11.5–15.5)
WBC: 11.6 10*3/uL — ABNORMAL HIGH (ref 4.0–10.5)
nRBC: 0 % (ref 0.0–0.2)

## 2019-12-18 LAB — MAGNESIUM: Magnesium: 1.9 mg/dL (ref 1.7–2.4)

## 2019-12-18 MED ORDER — SODIUM CHLORIDE 0.9 % IV SOLN: Freq: Once | INTRAVENOUS | Status: AC

## 2019-12-18 NOTE — Progress Notes (Signed)
Daniel [name]  PROGRESS NOTE    Daniel [name]  [id number] DOB: Feb 23, 1946 DOA: 12/11/2019 PCP: Vivi Barrack, MD   Brief Narrative:   Daniel Diss Harperis a 74 y.o.malewith medical history significant ofHTN, CVA with residual right hemiparesis in 2010 on Plavix, schizophrenia, and psychosis.Patient was just mumbling most times when I talked to him,also have history obtained from patient history and ED staff.according to sister [name], whom he lives withpatient baseline usesa caneto ambulate. Hefell down2 days ago, trying to get out of bed,and the patient stayed on the floor for 2 hours before he coulduse some help from neighbor.Since then patient refused to get out of bed and walk again,continue to complain about the right leg pain and " all locked up of right hip". He tried to get up and put weight on his right leg again today and fell a second time. [name] called EMS. She states that the leg has been swollen for "a while now" but seemed more swollen today.riage nurse, EMS came to his home and found him to be clammy, fatigued, and lethargic and brought him to the ED. When patient was questioned, he states that he "just hit the floor" and "maybe he was mopping the floor". When asked other questions, he became somnolent and did not answer.  12/18/19: No acute events ON. He's in good spirits this AM. Will add fluids today.    Assessment & Plan:   Active Problems:   Anemia   Hyperlipidemia   Benign hypertension with CKD (chronic kidney disease) stage III   Schizophrenia (HCC)   Closed displaced intertrochanteric fracture of right femur (HCC)   LFT elevation   AKI (acute kidney injury) ([place])  Right closed intertrochanteric fracture right femur - s/p R femoral IMN - PT/OT per orthopedic recommendations - pain control with oxycodone and gabapentin - senokot-docusate  AKI - admission SCr 3.22 - continue fluids - watch nephrotoxins - has CKD w/ an  unknown baseline, can not be more specific that CKD 3 - 4; need outside records     - Scr is stable, likely new baseline, will give him a little more fluids and see  Normocytic Anemia Iron deficiency - Likely some dilutional component 8.7 on admit to 7.1 12/12/19 - s/p 2 units PRBC prior to OR after consult with orthopedics - anemia panel 12/11/19 iron 28, ferritin 131, B12 754; Add ferreheme?     - 12/18/19: Hgb stable, no evidence of bleed, continue to monitor  Elevated LFTs - could be from acute trauma - exam normal; monitor  HTN - continue lisinopril, metoprolol, amlodipine - titrate as necessary - PRN hydralazine     - can schedule hydralazine  Hyperlipidemia - continue home lipitor; monitor LFTs  Hypokalemia Hypomagnesemia Hypophosphatemia -replaced, monitor  DVT prophylaxis: lovenox Code Status: FULL   Disposition Plan: TBD  Consultants:   Orthopedics  ROS:  Denies CP, N, V, HA, ab pain . Remainder 10-pt ROS is negative for all not previously mentioned.  Subjective: "No. I don't have any problems."  Objective: Vitals:   12/17/19 1408 12/17/19 2024 12/18/19 0403 12/18/19 0746  BP: (!) 143/74 130/75 (!) 146/76 (!) 146/70  Pulse: 78 77 68 86  Resp: 17 16 14 16   Temp: 98.3 F (36.8 C) 99.9 F (37.7 C) 97.7 F (36.5 C) 97.7 F (36.5 C)  TempSrc: Oral Oral Oral Oral  SpO2: 99% 100% 100% 100%  Weight:      Height:        Intake/Output Summary (Last  24 hours) at 12/18/2019 0945 Last data filed at 12/18/2019 0403 Gross per 24 hour  Intake 600 ml  Output 1900 ml  Net -1300 ml   Filed Weights   12/11/19 2119  Weight: 67.5 kg    Examination:  General: 74 y.o. male resting in bed in NAD Cardiovascular: RRR, +S1, S2, no m/g/r, equal pulses throughout Respiratory: CTABL, no w/r/r, normal WOB GI: BS+, NDNT, no masses noted, soft MSK: No e/c/c Neuro: alert to name, follows commands Psyc:  calm/cooperative   Data Reviewed: I have personally reviewed following labs and imaging studies.  CBC: Recent Labs  Lab 12/14/19 0322 12/15/19 0812 12/16/19 0404 12/17/19 0322 12/18/19 0323  WBC 9.4 11.1* 10.4 14.1* 11.6*  NEUTROABS 6.6 8.4* 7.8* 11.5* 8.7*  HGB 8.1* 8.3* 7.9* 8.2* 8.1*  HCT 24.2* 24.8* 24.1* 24.7* 24.3*  MCV 87.7 87.6 87.6 86.7 86.8  PLT 192 220 241 265 [id number]   Basic Metabolic Panel: Recent Labs  Lab 12/14/19 0322 12/15/19 0812 12/16/19 0404 12/17/19 0322 12/18/19 0323  NA 139 135 138 137 137  K 3.3* 3.7 4.3 4.4 4.1  CL 104 102 103 102 104  CO2 24 23 24 24 24   GLUCOSE 101* 137* 98 105* 99  BUN 32* 26* 24* 25* 32*  CREATININE 2.32* 2.36* 2.27* 2.33* 2.37*  CALCIUM 7.4* 7.4* 7.9* 8.2* 8.0*  MG 1.4*  --  1.6* 1.8 1.9  PHOS 2.1* 2.8 2.7 3.0  --    GFR: Estimated Creatinine Clearance: 26.1 mL/min (A) (by C-G formula based on SCr of 2.37 mg/dL (H)). Liver Function Tests: Recent Labs  Lab 12/11/19 1410 12/12/19 0009 12/13/19 0448 12/14/19 0322 12/15/19 [id number] 12/16/19 0404 12/17/19 0322 12/18/19 0323  AST 168* 121* 69*  --   --   --   --  39  ALT 73* 57* 35  --   --   --   --  15  ALKPHOS 60 47 43  --   --   --   --  60  BILITOT 1.7* 1.2 0.7  --   --   --   --  0.6  PROT 7.7 6.0* 5.6*  --   --   --   --  5.5*  ALBUMIN 4.0 3.2* 2.6* 2.5* 2.4* 2.4* 2.4* 2.3*   No results for input(s): LIPASE, AMYLASE in the last 168 hours. Recent Labs  Lab 12/12/19 0009  AMMONIA 40*   Coagulation Profile: No results for input(s): INR, PROTIME in the last 168 hours. Cardiac Enzymes: No results for input(s): CKTOTAL, CKMB, CKMBINDEX, TROPONINI in the last 168 hours. BNP (last 3 results) No results for input(s): PROBNP in the last 8760 hours. HbA1C: No results for input(s): HGBA1C in the last 72 hours. CBG: No results for input(s): GLUCAP in the last 168 hours. Lipid Profile: No results for input(s): CHOL, HDL, LDLCALC, TRIG, CHOLHDL, LDLDIRECT in the last  72 hours. Thyroid Function Tests: No results for input(s): TSH, T4TOTAL, FREET4, T3FREE, THYROIDAB in the last 72 hours. Anemia Panel: No results for input(s): VITAMINB12, FOLATE, FERRITIN, TIBC, IRON, RETICCTPCT in the last 72 hours. Sepsis Labs: No results for input(s): PROCALCITON, LATICACIDVEN in the last 168 hours.  Recent Results (from the past 240 hour(s))  Surgical pcr screen     Status: Abnormal   Collection Time: 12/11/19  9:24 PM   Specimen: Nasal Mucosa; Nasal Swab  Result Value Ref Range Status   MRSA, PCR NEGATIVE NEGATIVE Final   Staphylococcus aureus POSITIVE (A) NEGATIVE Final  Comment: (NOTE) The Xpert SA Assay (FDA approved for NASAL specimens in patients 71 years of age and older), is one component of a comprehensive surveillance program. It is not intended to diagnose infection nor to guide or monitor treatment. Performed at [facility], [place] [address], [place], [place] [address]   SARS CORONAVIRUS 2 (TAT 6-24 HRS) Nasopharyngeal Nasopharyngeal Swab     Status: None   Collection Time: 12/11/19  9:45 PM   Specimen: Nasopharyngeal Swab  Result Value Ref Range Status   SARS Coronavirus 2 NEGATIVE NEGATIVE Final    Comment: (NOTE) SARS-CoV-2 target nucleic acids are NOT DETECTED. The SARS-CoV-2 RNA is generally detectable in upper and lower respiratory specimens during the acute phase of infection. Negative results do not preclude SARS-CoV-2 infection, do not rule out co-infections with other pathogens, and should not be used as the sole basis for treatment or other patient management decisions. Negative results must be combined with clinical observations, patient history, and epidemiological information. The expected result is Negative. Fact Sheet for Patients: SugarRoll.be Fact Sheet for Healthcare Providers: https://www.woods-mathews.com/ This test is not yet approved or cleared by the [place] FDA and   has been authorized for detection and/or diagnosis of SARS-CoV-2 by FDA under an Emergency Use Authorization (EUA). This EUA will remain  in effect (meaning this test can be used) for the duration of the COVID-19 declaration under Section 56 4(b)(1) of the Act, 21 U.S.C. section 360bbb-3(b)(1), unless the authorization is terminated or revoked sooner. Performed at [facility], [place] [address], [place], [place] [address]       Radiology Studies: No results found.   Scheduled Meds: . sodium chloride   Intravenous Once  . amLODipine  10 mg Oral Daily  . atorvastatin  40 mg Oral Daily  . enoxaparin (LOVENOX) injection  30 mg Subcutaneous Q24H  . folic acid  1 mg Oral Daily  . gabapentin  100 mg Oral QHS  . hydrALAZINE  25 mg Oral Q8H  . lisinopril  20 mg Oral Daily  . magnesium oxide  400 mg Oral BID  . metoprolol tartrate  100 mg Oral BID  . multivitamin with minerals  1 tablet Oral Daily   Continuous Infusions: . sodium chloride 250 mL (12/12/19 1146)     LOS: 7 days    Time spent: 25 minutes spent in the coordination of care today.   Jonnie Finner, DO [facility]  If 7PM-7AM, please contact night-coverage [email or web address] 12/18/2019, 9:45 AM

## 2019-12-18 NOTE — TOC Progression Note (Addendum)
Transition of Care [facility]) - Progression Note    Patient Details  Name: Daniel [name] MRN: [id number] Date of Birth: 12/22/1945  Transition of Care [facility]) CM/SW Contact  Sharin Mons, RN Phone Number: 12/18/2019, 11:45 AM  Clinical Narrative:    Per MD pt will be ready to transition to next level of care, 12/19/2019... monitoring renal fx. NCM spoke with pt @ bedside with sister [place] on speaker phone about d/c plan. Pt and sister in agreement with pt d/cing to home with home health services. DECLINED SNF placement. Referral made with [facility] for home health services. Initial referral made with [facility] however [facility] wouldn't begin until Friday. Sister requesting W/C, [facility], AND [facility] for home. NCM will f/u with obtaining orders and making referral ....  [name] (Sister)       [phone]          [facility] will continue to monitor and follow for needs ...[name] KitchenMarland KitchenMarland Kitchen  12/18/2019 12:33 Home health orders in place, F2F needed per MD, and referral made for noted DME   Expected Discharge Plan: [place] [facility](Declined SNF) Barriers to Discharge: Continued Medical Work up  Expected Discharge Plan and Services Expected Discharge Plan: [place] [facility](Declined SNF) In-house Referral: NA Discharge Planning Services: CM Consult Post Acute Care Choice: [place] arrangements for the past 2 months: Single Family Home                                       Social Determinants of Health (SDOH) Interventions    Readmission Risk Interventions No flowsheet data found.

## 2019-12-18 NOTE — Progress Notes (Signed)
    Durable Medical Equipment  (From admission, onward)         Start     Ordered   12/18/19 1204  For home use only DME 3 n 1  Once     12/18/19 1208   12/18/19 1204  For home use only DME Walker rolling  Once    Question Answer Comment  Walker: With [employer]   Patient needs a walker to treat with the following condition Weakness      12/18/19 1208   12/18/19 1204  For home use only DME lightweight manual wheelchair with seat cushion  Once    Comments: Patient suffers from R femur fx repair which impairs their ability to perform daily activities like walking in the home.  A rolling walker will not resolve  issue with performing activities of daily living. A wheelchair will allow patient to safely perform daily activities. Patient is not able to propel themselves in the home using a standard weight wheelchair due to R femur fx repair/ weakness. Patient can self propel in the lightweight wheelchair. Length of need: 12 months Accessories: elevating leg rests (ELRs), wheel locks, extensions and anti-tippers.   12/18/19 1208

## 2019-12-18 NOTE — Plan of Care (Signed)

## 2019-12-19 LAB — RENAL FUNCTION PANEL
Albumin: 2.4 g/dL — ABNORMAL LOW (ref 3.5–5.0)
Anion gap: 11 (ref 5–15)
BUN: 30 mg/dL — ABNORMAL HIGH (ref 8–23)
CO2: 24 mmol/L (ref 22–32)
Calcium: 8.2 mg/dL — ABNORMAL LOW (ref 8.9–10.3)
Chloride: 103 mmol/L (ref 98–111)
Creatinine, Ser: 2.26 mg/dL — ABNORMAL HIGH (ref 0.61–1.24)
GFR calc Af Amer: 32 mL/min — ABNORMAL LOW (ref >60)
GFR calc non Af Amer: 28 mL/min — ABNORMAL LOW (ref >60)
Glucose, Bld: 106 mg/dL — ABNORMAL HIGH (ref 70–99)
Phosphorus: 3.4 mg/dL (ref 2.5–4.6)
Potassium: 4 mmol/L (ref 3.5–5.1)
Sodium: 138 mmol/L (ref 135–145)

## 2019-12-19 LAB — MAGNESIUM: Magnesium: 1.9 mg/dL (ref 1.7–2.4)

## 2019-12-19 LAB — CBC WITH DIFFERENTIAL/PLATELET
Abs Immature Granulocytes: 0.07 10*3/uL (ref 0.00–0.07)
Basophils Absolute: 0 10*3/uL (ref 0.0–0.1)
Basophils Relative: 0 %
Eosinophils Absolute: 0.3 10*3/uL (ref 0.0–0.5)
Eosinophils Relative: 3 %
HCT: 25.2 % — ABNORMAL LOW (ref 39.0–52.0)
Hemoglobin: 8.2 g/dL — ABNORMAL LOW (ref 13.0–17.0)
Immature Granulocytes: 1 %
Lymphocytes Relative: 7 %
Lymphs Abs: 0.8 10*3/uL (ref 0.7–4.0)
MCH: 28.8 pg (ref 26.0–34.0)
MCHC: 32.5 g/dL (ref 30.0–36.0)
MCV: 88.4 fL (ref 80.0–100.0)
Monocytes Absolute: 1.4 10*3/uL — ABNORMAL HIGH (ref 0.1–1.0)
Monocytes Relative: 12 %
Neutro Abs: 9.1 10*3/uL — ABNORMAL HIGH (ref 1.7–7.7)
Neutrophils Relative %: 77 %
Platelets: 293 10*3/uL (ref 150–400)
RBC: 2.85 MIL/uL — ABNORMAL LOW (ref 4.22–5.81)
RDW: 15 % (ref 11.5–15.5)
WBC: 11.7 10*3/uL — ABNORMAL HIGH (ref 4.0–10.5)
nRBC: 0 % (ref 0.0–0.2)

## 2019-12-19 LAB — CREATININE, SERUM
Creatinine, Ser: 2.36 mg/dL — ABNORMAL HIGH (ref 0.61–1.24)
GFR calc Af Amer: 30 mL/min — ABNORMAL LOW (ref >60)
GFR calc non Af Amer: 26 mL/min — ABNORMAL LOW (ref >60)

## 2019-12-19 MED ORDER — HYDRALAZINE HCL 50 MG PO TABS
50.0000 mg | ORAL_TABLET | Freq: Three times a day (TID) | ORAL | Status: DC
  Administered 2019-12-19 – 2019-12-20 (×3): 50 mg via ORAL
  Filled 2019-12-19 (×3): qty 1

## 2019-12-19 NOTE — Progress Notes (Signed)
[name]  PROGRESS NOTE    Daniel [name]  [id number] DOB: 07/31/1946 DOA: 12/11/2019 PCP: Vivi Barrack, MD   Brief Narrative:   Daniel Diss Harperis a 74 y.o.malewith medical history significant ofHTN, CVA with residual right hemiparesis in 2010 on Plavix, schizophrenia, and psychosis.Patient was just mumbling most times when I talked to him,also have history obtained from patient history and ED staff.according to sister Daniel [name], whom he lives withpatient baseline usesa caneto ambulate. Hefell down2 days ago, trying to get out of bed,and the patient stayed on the floor for 2 hours before he coulduse some help from neighbor.Since then patient refused to get out of bed and walk again,continue to complain about the right leg pain and " all locked up of right hip". He tried to get up and put weight on his right leg again today and fell a second time. Daniel [name] called EMS. She states that the leg has been swollen for "a while now" but seemed more swollen today.riage nurse, EMS came to his home and found him to be clammy, fatigued, and lethargic and brought him to the ED. When patient was questioned, he states that he "just hit the floor" and "maybe he was mopping the floor". When asked other questions, he became somnolent and did not answer.  12/19/19: BP up this afternoon. Increase hydralazine. Still weak with PT. Not quite safe for discharge to home with family/HHPT. That is ultimate goal. Reassess for tomorrow.   Assessment & Plan:   Active Problems:   Anemia   Hyperlipidemia   Benign hypertension with CKD (chronic kidney disease) stage III   Schizophrenia (HCC)   Closed displaced intertrochanteric fracture of right femur (HCC)   LFT elevation   AKI (acute kidney injury) ([place])  Right closed intertrochanteric fracture right femur - s/p R femoral IMN - PT/OT per orthopedic recommendations - pain control with oxycodone and gabapentin - senokot-docusate  AKI -  admission SCr 3.22 - continue fluids - watch nephrotoxins - has CKD w/ an unknown baseline, can not be more specific that CKD 3 - 4; need outside records - Scr is stable, likely new baseline  Normocytic Anemia Iron deficiency - Likely some dilutional component 8.7 on admit to 7.1 12/12/19 - s/p 2 units PRBC prior to OR after consult with orthopedics - anemia panel 12/11/19 iron 28, ferritin 131, B12 754; Add ferreheme? - 12/18/19: Hgb stable, no evidence of bleed, continue to monitor  Elevated LFTs - could be from acute trauma -exam normal; monitor  HTN - continue lisinopril, metoprolol, amlodipine, hydralazine - titrate as necessary - PRN hydralazine - increase hydralazine to 50 mg TID  Hyperlipidemia - continue home lipitor; monitor LFTs  Hypokalemia Hypomagnesemia Hypophosphatemia -replaced, monitor  DVT prophylaxis: lovenox Code Status: FULL Family Communication: None at bedside   Disposition Plan: Still weak with PT. Not quite safe for discharge to home with family/HHPT. That is ultimate goal. Reassess for tomorrow.   Consultants:   Orthopedics   ROS:  Denies CP, N, HA, dyspnea. Remainder 10-pt ROS is negative for all not previously mentioned.  Subjective: "I might get to go?"  Objective: Vitals:   12/18/19 1943 12/19/19 0414 12/19/19 1025 12/19/19 1400  BP: 132/70 (!) 146/75 (!) 160/87 (!) 173/77  Pulse: 76 73 74 78  Resp: 17 17  18   Temp: 98.9 F (37.2 C) 98.6 F (37 C) 98.1 F (36.7 C) 97.9 F (36.6 C)  TempSrc: Oral Oral Oral Oral  SpO2: 100% 97% 100% 100%  Weight:  Height:        Intake/Output Summary (Last 24 hours) at 12/19/2019 1403 Last data filed at 12/19/2019 1235 Gross per 24 hour  Intake --  Output 1450 ml  Net -1450 ml   Filed Weights   12/11/19 2119  Weight: 67.5 kg    Examination:  General: 74 y.o. male resting in chair in NAD Cardiovascular: RRR, +S1,  S2, no m/g/r Respiratory: CTABL, no w/r/r, normal WOB GI: BS+, NDNT, soft, no masses noted MSK: No e/c/c Neuro: alert to name, follows commands Psyc: calm/cooperative   Data Reviewed: I have personally reviewed following labs and imaging studies.  CBC: Recent Labs  Lab 12/15/19 0812 12/16/19 0404 12/17/19 0322 12/18/19 0323 12/19/19 0455  WBC 11.1* 10.4 14.1* 11.6* 11.7*  NEUTROABS 8.4* 7.8* 11.5* 8.7* 9.1*  HGB 8.3* 7.9* 8.2* 8.1* 8.2*  HCT 24.8* 24.1* 24.7* 24.3* 25.2*  MCV 87.6 87.6 86.7 86.8 88.4  PLT 220 241 265 266 0000000   Basic Metabolic Panel: Recent Labs  Lab 12/14/19 0322 12/15/19 0812 12/16/19 0404 12/17/19 0322 12/18/19 0323 12/19/19 0455  NA 139 135 138 137 137 138  K 3.3* 3.7 4.3 4.4 4.1 4.0  CL 104 102 103 102 104 103  CO2 24 23 24 24 24 24   GLUCOSE 101* 137* 98 105* 99 106*  BUN 32* 26* 24* 25* 32* 30*  CREATININE 2.32* 2.36* 2.27* 2.33* 2.37* 2.26*  2.36*  CALCIUM 7.4* 7.4* 7.9* 8.2* 8.0* 8.2*  MG 1.4*  --  1.6* 1.8 1.9 1.9  PHOS 2.1* 2.8 2.7 3.0  --  3.4   GFR: Estimated Creatinine Clearance: 26.2 mL/min (A) (by C-G formula based on SCr of 2.36 mg/dL (H)). Liver Function Tests: Recent Labs  Lab 12/13/19 0448 12/15/19 [id number] 12/16/19 0404 12/17/19 0322 12/18/19 0323 12/19/19 0455  AST 69*  --   --   --  39  --   ALT 35  --   --   --  15  --   ALKPHOS 43  --   --   --  60  --   BILITOT 0.7  --   --   --  0.6  --   PROT 5.6*  --   --   --  5.5*  --   ALBUMIN 2.6* 2.4* 2.4* 2.4* 2.3* 2.4*   No results for input(s): LIPASE, AMYLASE in the last 168 hours. No results for input(s): AMMONIA in the last 168 hours. Coagulation Profile: No results for input(s): INR, PROTIME in the last 168 hours. Cardiac Enzymes: No results for input(s): CKTOTAL, CKMB, CKMBINDEX, TROPONINI in the last 168 hours. BNP (last 3 results) No results for input(s): PROBNP in the last 8760 hours. HbA1C: No results for input(s): HGBA1C in the last 72 hours. CBG: No  results for input(s): GLUCAP in the last 168 hours. Lipid Profile: No results for input(s): CHOL, HDL, LDLCALC, TRIG, CHOLHDL, LDLDIRECT in the last 72 hours. Thyroid Function Tests: No results for input(s): TSH, T4TOTAL, FREET4, T3FREE, THYROIDAB in the last 72 hours. Anemia Panel: No results for input(s): VITAMINB12, FOLATE, FERRITIN, TIBC, IRON, RETICCTPCT in the last 72 hours. Sepsis Labs: No results for input(s): PROCALCITON, LATICACIDVEN in the last 168 hours.  Recent Results (from the past 240 hour(s))  Surgical pcr screen     Status: Abnormal   Collection Time: 12/11/19  9:24 PM   Specimen: Nasal Mucosa; Nasal Swab  Result Value Ref Range Status   MRSA, PCR NEGATIVE NEGATIVE Final   Staphylococcus aureus  POSITIVE (A) NEGATIVE Final    Comment: (NOTE) The Xpert SA Assay (FDA approved for NASAL specimens in patients 53 years of age and older), is one component of a comprehensive surveillance program. It is not intended to diagnose infection nor to guide or monitor treatment. Performed at [facility], [place] [address] [place], [place] [address]   SARS CORONAVIRUS 2 (TAT 6-24 HRS) Nasopharyngeal Nasopharyngeal Swab     Status: None   Collection Time: 12/11/19  9:45 PM   Specimen: Nasopharyngeal Swab  Result Value Ref Range Status   SARS Coronavirus 2 NEGATIVE NEGATIVE Final    Comment: (NOTE) SARS-CoV-2 target nucleic acids are NOT DETECTED. The SARS-CoV-2 RNA is generally detectable in upper and lower respiratory specimens during the acute phase of infection. Negative results do not preclude SARS-CoV-2 infection, do not rule out co-infections with other pathogens, and should not be used as the sole basis for treatment or other patient management decisions. Negative results must be combined with clinical observations, patient history, and epidemiological information. The expected result is Negative. Fact Sheet for  Patients: SugarRoll.be Fact Sheet for Healthcare Providers: https://www.woods-mathews.com/ This test is not yet approved or cleared by the [place] FDA and  has been authorized for detection and/or diagnosis of SARS-CoV-2 by FDA under an Emergency Use Authorization (EUA). This EUA will remain  in effect (meaning this test can be used) for the duration of the COVID-19 declaration under Section 56 4(b)(1) of the Act, 21 U.S.C. section 360bbb-3(b)(1), unless the authorization is terminated or revoked sooner. Performed at [facility], [place] [address] [place], [place] [address]       Radiology Studies: No results found.   Scheduled Meds: . sodium chloride   Intravenous Once  . amLODipine  10 mg Oral Daily  . atorvastatin  40 mg Oral Daily  . enoxaparin (LOVENOX) injection  30 mg Subcutaneous Q24H  . folic acid  1 mg Oral Daily  . gabapentin  100 mg Oral QHS  . hydrALAZINE  25 mg Oral Q8H  . lisinopril  20 mg Oral Daily  . magnesium oxide  400 mg Oral BID  . metoprolol tartrate  100 mg Oral BID  . multivitamin with minerals  1 tablet Oral Daily   Continuous Infusions: . sodium chloride 250 mL (12/12/19 1146)     LOS: 8 days    Time spent: 25 minutes spent in the coordination of care today.    Jonnie Finner, DO [facility]  If 7PM-7AM, please contact night-coverage [email or web address] 12/19/2019, 2:03 PM

## 2019-12-19 NOTE — Progress Notes (Signed)
Occupational Therapy Treatment Patient Details Name: Daniel [name] MRN: [id number] DOB: 1946/06/15 Today's Date: 12/19/2019    History of present illness 74 yo s/p IM nail r femoral on 1/5 and R non displaced ankle fx after fall at home. PMH HTN CVA with R hemiparesis 2010, schizophrenia and psychosis   OT comments  Pt making progress towards OT goals, tolerating functional transfers/mobility and toileting/grooming ADL during this session. Pt continues to require at least modA for sit<>stand using RW, though overall requiring +2 assist for safe completion of functional transfers and able to take few steps this session with +2 assist provided. Pt overall requiring maxA for toileting and setup/minguard assist for seated grooming ADL. Pt very pleasant and motivated to work with therapies throughout. Noted pt's family with preference for pt to return home (vs rehab) at time of discharge. If pt to return home recommend he have hands on 24hr supervision/assist for safe completion of ADL/mobility tasks. Recommend follow up [place] services to progress pt towards his PLOF. Will continue to follow while acutely admitted.   Follow Up Recommendations  Home health OT;Supervision/Assistance - 24 hour(family declining SNF)    Equipment Recommendations  3 in 1 bedside commode;Wheelchair (measurements OT);Wheelchair cushion (measurements OT)          Precautions / Restrictions Precautions Precautions: Fall Required Braces or Orthoses: Other Brace Other Brace: Cam boot RLE Restrictions RLE Weight Bearing: Weight bearing as tolerated       Mobility Bed Mobility Overal bed mobility: Needs Assistance             General bed mobility comments: pt seated EOB with PT upon arrival, requires assist to scoot hips towards EOB  Transfers Overall transfer level: Needs assistance Equipment used: 2 person hand held assist;Rolling walker (2 wheeled) Transfers: Sit to/from [employer] Sit  to Stand: Mod assist;+2 safety/equipment   Squat pivot transfers: Mod assist;Max assist;+2 physical assistance;+2 safety/equipment     General transfer comment: squat pivot initially to [facility] with VCs for safe hand/LE placement and requiring assist for transition of LEs/hips to seated surface; pt performed multiple sit<>stands from [facility] to RW with overall modA (+2 safety), VCs for safe hand placement but pt with good boosting from [facility] armrests     Balance Overall balance assessment: Needs assistance   Sitting balance-Leahy Scale: Fair       Standing balance-Leahy Scale: Poor Standing balance comment: reliant on UE support and external assist                           ADL either performed or assessed with clinical judgement   ADL Overall ADL's : Needs assistance/impaired     Grooming: Wash/dry face;Set up;Sitting                   Toilet Transfer: Moderate assistance;Maximal assistance;+2 for safety/equipment;Squat-pivot;Stand-pivot;+2 for physical assistance Toilet Transfer Details (indicate cue type and reason): squat pivot to [facility] with +2 assist and VCs for hand/LE placement, pt then able to take few steps from [facility] to recliner with +2 assist using RW Toileting- Clothing Manipulation and Hygiene: Maximal assistance;+2 for physical assistance;Sit to/from stand       Functional mobility during ADLs: Moderate assistance;Maximal assistance;+2 for physical assistance;+2 for safety/equipment;Rolling walker       Vision       Perception     Praxis      Cognition Arousal/Alertness: Awake/alert Behavior During Therapy: WFL for tasks assessed/performed Overall  Cognitive Status: Within Functional Limits for tasks assessed                               Problem Solving: Slow processing General Comments: Participating well, appropriate for basic tasks         Exercises     Shoulder Instructions       General Comments      Pertinent Vitals/  Pain       Pain Assessment: Faces Faces Pain Scale: Hurts little more Pain Location: R foot Pain Descriptors / Indicators: Grimacing;Guarding Pain Intervention(s): Limited activity within patient's tolerance;Monitored during session;Repositioned  Home Living                                          Prior Functioning/Environment              Frequency  Min 2X/week        Progress Toward Goals  OT Goals(current goals can now be found in the care plan section)  Progress towards OT goals: Progressing toward goals  Acute Rehab OT Goals Patient Stated Goal: to return home OT Goal Formulation: With patient Time For Goal Achievement: 12/27/19 Potential to Achieve Goals: Good ADL Goals Pt Will Perform Grooming: standing;with min guard assist Pt Will Perform Lower Body Dressing: with mod assist;sitting/lateral leans;sit to/from stand;with adaptive equipment Pt Will Transfer to Toilet: with min assist;stand pivot transfer;bedside commode Pt Will Perform Toileting - Clothing Manipulation and hygiene: with mod assist;sitting/lateral leans;sit to/from stand Pt/caregiver will Perform Home Exercise Program: Increased strength;Both right and left upper extremity  Plan Discharge plan needs to be updated    Co-evaluation    PT/OT/SLP Co-Evaluation/Treatment: Yes Reason for Co-Treatment: For patient/therapist safety;To address functional/ADL transfers   OT goals addressed during session: ADL's and self-care      AM-PAC OT "6 Clicks" Daily Activity     Outcome Measure   Help from another person eating meals?: None Help from another person taking care of personal grooming?: A Little Help from another person toileting, which includes using toliet, bedpan, or urinal?: A Lot Help from another person bathing (including washing, rinsing, drying)?: A Lot Help from another person to put on and taking off regular upper body clothing?: A Little Help from another person to  put on and taking off regular lower body clothing?: A Lot 6 Click Score: 16    End of Session Equipment Utilized During Treatment: Gait belt;Rolling walker  OT Visit Diagnosis: Unsteadiness on feet (R26.81);Muscle weakness (generalized) (M62.81);Pain Pain - Right/Left: Right Pain - part of body: Hip   Activity Tolerance Patient tolerated treatment well   Patient Left in chair;with call bell/phone within reach;with chair alarm set   Nurse Communication Mobility status;Need for lift equipment        Time: [phone] OT Time Calculation (min): 33 min  Charges: OT General Charges $OT Visit: 1 Visit OT Treatments $Self Care/Home Management : 8-22 mins  Lou Cal, OT Supplemental Rehabilitation Services Pager [phone] Office [phone]    Raymondo Band 12/19/2019, 1:38 PM

## 2019-12-19 NOTE — Progress Notes (Signed)
Physical Therapy Treatment Patient Details Name: Daniel [name] MRN: [id number] DOB: 31-Mar-1946 Today's Date: 12/19/2019    History of Present Illness 74 yo s/p IM nail r femoral on 1/5 and R non displaced ankle fx after fall at home. PMH HTN CVA with R hemiparesis 2010, schizophrenia and psychosis    PT Comments    Continuing work on functional mobility and activity tolerance;  Notably better functional mobility, and able to initiate gait training with RW (with +2 assist); Needing 2 person assist for out of bed activity/transfers; per TOC note, they have +2 assist available in the home; Pt very pleasant and motivated to work with therapies throughout. Noted pt's family with preference for pt to return home (vs rehab) at time of discharge. If pt to return home recommend he have hands on 24hr supervision/assist for safe completion of ADL/mobility tasks. Recommend follow up HHOT/PT services to progress pt towards his PLOF. Will continue to follow while acutely admitted.   Follow Up Recommendations  Home health PT;Supervision/Assistance - 24 hour     Equipment Recommendations  Rolling walker with 5" wheels;Wheelchair (measurements PT);3in1 (PT)(ambulance transport home)    Recommendations for Other Services OT consult     Precautions / Restrictions Precautions Precautions: Fall Required Braces or Orthoses: Other Brace Other Brace: Cam boot RLE Restrictions RLE Weight Bearing: Weight bearing as tolerated    Mobility  Bed Mobility Overal bed mobility: Needs Assistance Bed Mobility: Supine to Sit     Supine to sit: Mod assist     General bed mobility comments: Heavy mod assist to clear Les from EOB and elevate trunk to sit  Transfers Overall transfer level: Needs assistance Equipment used: 2 person hand held assist;Rolling walker (2 wheeled) Transfers: Public house manager;Sit to/from Stand Sit to Stand: Mod assist;+2 safety/equipment   Squat pivot transfers: Mod assist;Max  assist;+2 physical assistance;+2 safety/equipment     General transfer comment: squat pivot initially to [facility] with VCs for safe hand/LE placement and requiring assist for transition of LEs/hips to seated surface; pt performed multiple sit<>stands from [facility] to RW with overall modA (+2 safety), VCs for safe hand placement but pt with good boosting from [facility] armrests   Ambulation/Gait Ambulation/Gait assistance: Mod assist;+2 safety/equipment Gait Distance (Feet): 6 Feet Assistive device: Rolling walker (2 wheeled) Gait Pattern/deviations: Step-to pattern;Decreased step length - right;Decreased step length - left     General Gait Details: Good supprt of self on RW; initially needing mod assist to advance RLE and LLE; erratic step length and width RLE   Stairs             Wheelchair Mobility    Modified Rankin (Stroke Patients Only)       Balance Overall balance assessment: Needs assistance   Sitting balance-Leahy Scale: Fair       Standing balance-Leahy Scale: Poor Standing balance comment: reliant on UE support and external assist                            Cognition Arousal/Alertness: Awake/alert Behavior During Therapy: WFL for tasks assessed/performed Overall Cognitive Status: Within Functional Limits for tasks assessed                               Problem Solving: Slow processing General Comments: Participating well, appropriate for basic tasks       Exercises      General Comments  Pertinent Vitals/Pain Pain Assessment: Faces Faces Pain Scale: Hurts little more Pain Location: R foot Pain Descriptors / Indicators: Grimacing;Guarding Pain Intervention(s): Monitored during session    Home Living                      Prior Function            PT Goals (current goals can now be found in the care plan section) Acute Rehab PT Goals Patient Stated Goal: to return home PT Goal Formulation: With patient Time For  Goal Achievement: 12/27/19 Potential to Achieve Goals: Fair Progress towards PT goals: Progressing toward goals    Frequency    Min 3X/week      PT Plan Current plan remains appropriate    Co-evaluation PT/OT/SLP Co-Evaluation/Treatment: Yes Reason for Co-Treatment: For patient/therapist safety PT goals addressed during session: Mobility/safety with mobility OT goals addressed during session: ADL's and self-care      AM-PAC PT "6 Clicks" Mobility   Outcome Measure  Help needed turning from your back to your side while in a flat bed without using bedrails?: A Lot Help needed moving from lying on your back to sitting on the side of a flat bed without using bedrails?: A Lot Help needed moving to and from a bed to a chair (including a wheelchair)?: A Lot Help needed standing up from a chair using your arms (e.g., wheelchair or bedside chair)?: A Lot Help needed to walk in hospital room?: A Lot Help needed climbing 3-5 steps with a railing? : Total 6 Click Score: 11    End of Session Equipment Utilized During Treatment: Gait belt Activity Tolerance: Patient tolerated treatment well Patient left: in chair;with call bell/phone within reach;with chair alarm set Nurse Communication: Mobility status;Need for lift equipment PT Visit Diagnosis: Unsteadiness on feet (R26.81);Difficulty in walking, not elsewhere classified (R26.2);Muscle weakness (generalized) (M62.81);Hemiplegia and hemiparesis;Pain Hemiplegia - Right/Left: Right Hemiplegia - dominant/non-dominant: Dominant Hemiplegia - caused by: Unspecified Pain - Right/Left: Right Pain - part of body: Leg     Time: [id number] PT Time Calculation (min) (ACUTE ONLY): 34 min  Charges:  $Gait Training: 8-22 mins                     Roney Marion, PT  Acute Rehabilitation Services Pager [phone] Office [phone]    Colletta [place] 12/19/2019, 3:04 PM

## 2019-12-20 MED ORDER — POLYETHYLENE GLYCOL 3350 17 G PO PACK: 17.0000 g | PACK | Freq: Every day | ORAL | 0 refills | Status: DC | PRN

## 2019-12-20 MED ORDER — AMLODIPINE BESYLATE 10 MG PO TABS: 10.0000 mg | ORAL_TABLET | Freq: Every day | ORAL | 0 refills | Status: DC

## 2019-12-20 MED ORDER — HYDRALAZINE HCL 50 MG PO TABS: 50.0000 mg | ORAL_TABLET | Freq: Three times a day (TID) | ORAL | 0 refills | Status: DC

## 2019-12-20 MED ORDER — SENNOSIDES-DOCUSATE SODIUM 8.6-50 MG PO TABS: 1.0000 | ORAL_TABLET | Freq: Two times a day (BID) | ORAL | 0 refills | Status: DC

## 2019-12-20 NOTE — TOC Transition Note (Addendum)
Transition of Care [facility]) - CM/SW Discharge Note   Patient Details  Name: Daniel [name] MRN: [id number] Date of Birth: 1946/03/08  Transition of Care [facility]) CM/SW Contact:  Sharin Mons, RN Phone Number: [phone] 12/20/2019, 9:33 AM   Clinical Narrative:    Patient will DC to: home with sister Anticipated DC date: 12/20/2019 Family notified: [name] ( sister) Transport by: Corey Harold  Per MD patient ready for DC today. Pt will transition to home with home health services following, [facility]. [facility] liaison made aware of d/cplan.RN, patient, and patient's family notified of DC. Ambulance transport requested for patient.   DME will not be able to transport with pt in ambulance. Family member to pick equipment up after work today from [employer].   RNCM will sign off for now as intervention is no longer needed. Please consult [place] again if new needs arise.   [name] (Sister)      [phone]       Final next level of care: Home w Home Health Services Barriers to Discharge: No Barriers Identified   Patient Goals and CMS Choice   CMS Medicare.gov Compare Post Acute Care list provided to:: Patient Represenative (must comment)([name] (sister/caregiver)) Choice offered to / list presented to : Sibling([name] Event organiser))  Discharge Placement   Discharge Plan and Services In-house Referral: NA Discharge Planning Services: CM Consult Post Acute Care Choice: Home Health          DME Arranged: 3-N-1, Walker rolling, Wheelchair manual DME Agency: [employer] Date DME Agency Contacted: 12/18/19 Time DME Agency Contacted: 1229 Representative spoke with at DME Agency: [place] via text HH Arranged: PT, OT, Nurse's Aide, Social Work [employer] Agency: [facility] [employer] [facility] [employer] Date [place]: 12/18/19 Time [place]: 68 Representative spoke with at [place]: [place] ([place]) Interventions      Readmission Risk Interventions No flowsheet data found.

## 2019-12-20 NOTE — Progress Notes (Signed)
Physical Therapy Treatment Patient Details Name: Daniel [name] MRN: [id number] DOB: 01-23-1946 Today's Date: 12/20/2019    History of Present Illness 74 yo s/p IM nail r femoral on 1/5 and R non displaced ankle fx after fall at home. PMH HTN CVA with R hemiparesis 2010, schizophrenia and psychosis    PT Comments    Continuing work on functional mobility and activity tolerance;  Able to walk further and needing less assist for R foot advancement; Updated his sister, [name], on his progress and plan for [facility] therapies to continue functional progress; noted plans for dc home today, and Mr. Bras seems happy about the plan   Follow Up Recommendations  Home health PT;Supervision/Assistance - 24 hour     Equipment Recommendations  Rolling walker with 5" wheels;Wheelchair (measurements PT);3in1 (PT)(ambulance transport home)    Recommendations for Other Services OT consult     Precautions / Restrictions Precautions Precautions: Fall Required Braces or Orthoses: Other Brace Other Brace: Cam boot RLE Restrictions Weight Bearing Restrictions: Yes RLE Weight Bearing: Weight bearing as tolerated    Mobility  Bed Mobility Overal bed mobility: Needs Assistance Bed Mobility: Supine to Sit     Supine to sit: Mod assist     General bed mobility comments: Heavy mod assist to clear Les from EOB and elevate trunk to sit; got up from L side of bed to approximate home  Transfers Overall transfer level: Needs assistance Equipment used: Rolling walker (2 wheeled) Transfers: Sit to/from Stand Sit to Stand: Mod assist         General transfer comment: Mod assist to power up and steady during transition of hands from bed to RW  Ambulation/Gait Ambulation/Gait assistance: Mod assist(second person helpful for chair push) Gait Distance (Feet): 45 Feet Assistive device: Rolling walker (2 wheeled) Gait Pattern/deviations: Step-to pattern;Decreased step length - right;Decreased step  length - left     General Gait Details: Cues for fully upright posture and full hip extension to make R and LLE advancement easier; less need to assist in advancing/stepping RLE today, and good progression of gait distance   Stairs             Wheelchair Mobility    Modified Rankin (Stroke Patients Only)       Balance     Sitting balance-Leahy Scale: Fair       Standing balance-Leahy Scale: Poor Standing balance comment: reliant on UE support and external assist                            Cognition Arousal/Alertness: Awake/alert Behavior During Therapy: WFL for tasks assessed/performed Overall Cognitive Status: Within Functional Limits for tasks assessed                                 General Comments: Participating well, appropriate for basic tasks       Exercises      General Comments General comments (skin integrity, edema, etc.): Discussed functional status with [name], pt's sister and caregiver      Pertinent Vitals/Pain Pain Assessment: Faces Faces Pain Scale: Hurts little more Pain Location: R foot Pain Descriptors / Indicators: Grimacing;Guarding Pain Intervention(s): Monitored during session    Home Living                      Prior Function  PT Goals (current goals can now be found in the care plan section) Acute Rehab PT Goals Patient Stated Goal: to return home PT Goal Formulation: With patient Time For Goal Achievement: 12/27/19 Potential to Achieve Goals: Fair Progress towards PT goals: Progressing toward goals    Frequency    Min 3X/week      PT Plan Current plan remains appropriate    Co-evaluation              AM-PAC PT "6 Clicks" Mobility   Outcome Measure  Help needed turning from your back to your side while in a flat bed without using bedrails?: A Lot Help needed moving from lying on your back to sitting on the side of a flat bed without using bedrails?: A  Lot Help needed moving to and from a bed to a chair (including a wheelchair)?: A Lot Help needed standing up from a chair using your arms (e.g., wheelchair or bedside chair)?: A Lot Help needed to walk in hospital room?: A Lot Help needed climbing 3-5 steps with a railing? : Total 6 Click Score: 11    End of Session Equipment Utilized During Treatment: Gait belt Activity Tolerance: Patient tolerated treatment well Patient left: in chair;with call bell/phone within reach;with chair alarm set Nurse Communication: Mobility status PT Visit Diagnosis: Unsteadiness on feet (R26.81);Difficulty in walking, not elsewhere classified (R26.2);Muscle weakness (generalized) (M62.81);Hemiplegia and hemiparesis;Pain Hemiplegia - Right/Left: Right Hemiplegia - dominant/non-dominant: Dominant Hemiplegia - caused by: Unspecified Pain - Right/Left: Right Pain - part of body: Leg     Time: 0950-1005 PT Time Calculation (min) (ACUTE ONLY): 15 min  Charges:  $Gait Training: 8-22 mins                     Roney Marion, PT  Acute Rehabilitation Services Pager [phone] Office [place] 12/20/2019, 1:08 PM

## 2019-12-20 NOTE — Progress Notes (Signed)
Physical Therapy Note   Continuing to follow;   I'm overall happy with Daniel [name] functional progress -- able to walk short distance in room with mod assist of 2 yesterday;   Noted plans for dc home, and PT in agreement -- often home with familiar caregivers, routine, and environment is the most therapeutic place;   Attempted to speak with his sister, Daniel [name], to discuss his functional status, however no answer and mailbox was full;   Will reattempt   Roney Marion, PT  Acute Rehabilitation Services Pager [phone] Office [phone]

## 2019-12-20 NOTE — Plan of Care (Addendum)
Pt discharging home. Discharge instructions explained to pt and pt stated understanding. Packed all personal belongings. No further questions or concerns voiced. Awaiting PTAR for transportation at 1200.   Problem: Education: Goal: Knowledge of General Education information will improve Description: Including pain rating scale, medication(s)/side effects and non-pharmacologic comfort measures Outcome: Completed/Met   Problem: Health Behavior/Discharge Planning: Goal: Ability to manage health-related needs will improve Outcome: Completed/Met   Problem: Clinical Measurements: Goal: Ability to maintain clinical measurements within normal limits will improve Outcome: Completed/Met Goal: Will remain free from infection Outcome: Completed/Met Goal: Diagnostic test results will improve Outcome: Completed/Met Goal: Respiratory complications will improve Outcome: Completed/Met Goal: Cardiovascular complication will be avoided Outcome: Completed/Met   Problem: Activity: Goal: Risk for activity intolerance will decrease Outcome: Completed/Met   Problem: Nutrition: Goal: Adequate nutrition will be maintained Outcome: Completed/Met   Problem: Coping: Goal: Level of anxiety will decrease Outcome: Completed/Met   Problem: Elimination: Goal: Will not experience complications related to bowel motility Outcome: Completed/Met Goal: Will not experience complications related to urinary retention Outcome: Completed/Met   Problem: Pain Managment: Goal: General experience of comfort will improve Outcome: Completed/Met   Problem: Safety: Goal: Ability to remain free from injury will improve Outcome: Completed/Met   Problem: Skin Integrity: Goal: Risk for impaired skin integrity will decrease Outcome: Completed/Met   Problem: Education: Goal: Verbalization of understanding the information provided (i.e., activity precautions, restrictions, etc) will improve Outcome: Completed/Met Goal:  Individualized Educational Video(s) Outcome: Completed/Met   Problem: Activity: Goal: Ability to ambulate and perform ADLs will improve Outcome: Completed/Met   Problem: Clinical Measurements: Goal: Postoperative complications will be avoided or minimized Outcome: Completed/Met   Problem: Self-Concept: Goal: Ability to maintain and perform role responsibilities to the fullest extent possible will improve Outcome: Completed/Met   Problem: Pain Management: Goal: Pain level will decrease Outcome: Completed/Met

## 2019-12-20 NOTE — Discharge Summary (Signed)
Physician Discharge Summary  ATIF ZAPPONE [id number] DOB: Nov 19, 1946 DOA: 12/11/2019  PCP: Vivi Barrack, MD  Admit date: 12/11/2019 Discharge date: 12/20/2019  Admitted From: Home Disposition: Home.  Patient refused SNF  Recommendations for Outpatient Follow-up:  1. Follow up with PCP in 1 week with repeat CBC/BMP 2. Outpatient follow-up with orthopedics.  Wound care as per orthopedics recommendations. 3. Follow up in ED if symptoms worsen or new appear   Home Health: Home with PT/OT Equipment/Devices: None  Discharge Condition: Stable CODE STATUS: Full Diet recommendation: Heart healthy  Brief/Interim Summary: 74 year old male with history of hypertension, CVA with residual right hemiparesis in 2010 on Plavix, schizophrenia and psychosis presented with right closed intertrochanteric femur fracture for which she underwent surgical intervention.  He was also found to have AKI which is slightly improving.  PT recommended SNF placement.  Patient/family refused.  He will be discharged with home health.  Discharge Diagnoses:   Right closed intertrochanteric femur fracture -Status post right femoral intramedullary nailing by orthopedics.  Wound care as per orthopedics recommendations. -PT recommended SNF.  Patient/family refused. -We will need home health PT/OT upon discharge.  Will discharge patient home today. -Discharge pain regimen and DVT prophylaxis as per orthopedics  AKI -Admission creatinine 3.22. -Treated with IV fluids. -Probably has baseline CKD, unknown stage. -Creatinine stable.  Probably new baseline is the current one.  Normocytic anemia/iron deficiency -Hemoglobin stable.  Outpatient follow-up  Elevated LFTs -Improving.  Outpatient follow-up  Hypertension -Patient has been started on amlodipine and hydralazine.  Continue lisinopril, metoprolol, amlodipine and hydralazine on discharge.  Outpatient follow-up  Hyperlipidemia -Continue  Lipitor    Discharge Instructions  Discharge Instructions    Diet - low sodium heart healthy   Complete by: As directed    Increase activity slowly   Complete by: As directed      Allergies as of 12/20/2019   No Known Allergies     Medication List    TAKE these medications   amLODipine 10 MG tablet Commonly known as: NORVASC Take 1 tablet (10 mg total) by mouth daily. Start taking on: December 21, 2019   atorvastatin 40 MG tablet Commonly known as: LIPITOR Take 1 tablet (40 mg total) by mouth daily.   clopidogrel 75 MG tablet Commonly known as: PLAVIX Take 1 tablet (75 mg total) by mouth daily.   enoxaparin 30 MG/0.3ML injection Commonly known as: Lovenox Inject 0.3 mLs (30 mg total) into the skin daily.   folic acid 1 MG tablet Commonly known as: FOLVITE Take 1 tablet (1 mg total) by mouth daily.   gabapentin 100 MG capsule Commonly known as: NEURONTIN TAKE 1 CAPSULE BY MOUTH THREE TIMES A DAY What changed:   how much to take  how to take this  when to take this  additional instructions   hydrALAZINE 50 MG tablet Commonly known as: APRESOLINE Take 1 tablet (50 mg total) by mouth every 8 (eight) hours.   HYDROcodone-acetaminophen 5-325 MG tablet Commonly known as: Norco Take 1 tablet by mouth every 4 (four) hours as needed for severe pain.   lisinopril 20 MG tablet Commonly known as: ZESTRIL Take 1 tablet (20 mg total) by mouth daily.   metoprolol tartrate 100 MG tablet Commonly known as: LOPRESSOR Take 1 tablet (100 mg total) by mouth 2 (two) times daily.   multivitamin with minerals tablet Take 1 tablet by mouth daily.   polyethylene glycol 17 g packet Commonly known as: MIRALAX / GLYCOLAX Take 17 g by  mouth daily as needed for mild constipation.   senna-docusate 8.6-50 MG tablet Commonly known as: Senokot-S Take 1 tablet by mouth 2 (two) times daily.            Durable Medical Equipment  (From admission, onward)         Start      Ordered   12/18/19 1204  For home use only DME 3 n 1  Once     12/18/19 1208   12/18/19 1204  For home use only DME Walker rolling  Once    Question Answer Comment  Walker: With [place] Wheels   Patient needs a walker to treat with the following condition Weakness      12/18/19 1208   12/18/19 1204  For home use only DME lightweight manual wheelchair with seat cushion  Once    Comments: Patient suffers from R femur fx repair which impairs their ability to perform daily activities like walking in the home.  A rolling walker will not resolve  issue with performing activities of daily living. A wheelchair will allow patient to safely perform daily activities. Patient is not able to propel themselves in the home using a standard weight wheelchair due to R femur fx repair/ weakness. Patient can self propel in the lightweight wheelchair. Length of need: 12 months Accessories: elevating leg rests (ELRs), wheel locks, extensions and anti-tippers.   12/18/19 1208         Follow-up Information    Renette Butters, MD In 2 weeks.   Specialty: Orthopedic Surgery Contact information: [address] [place] [address] [place] [address] [phone]        [facility] Follow up.   Specialty: Home Health Services Why: home health services arranged Contact information: [place] [address] [phone]        [facility] Follow up.        Vivi Barrack, MD. Schedule an appointment as soon as possible for a visit in 1 week(s).   Specialty: Family Medicine Contact information: [address] [place] [address] [phone]          No Known Allergies  Consultations:  Orthopedics   Procedures/Studies: DG Chest 1 View  Result Date: 12/11/2019 CLINICAL DATA:  Pain status post fall EXAM: CHEST  1 VIEW COMPARISON:  07/30/2009 FINDINGS: The heart size and mediastinal contours are within normal limits. Both lungs are clear. The  visualized skeletal structures are unremarkable. IMPRESSION: No active disease. Electronically Signed   By: Constance Holster M.D.   On: 12/11/2019 15:42   DG Knee 2 Views Right  Result Date: 12/11/2019 CLINICAL DATA:  Right knee pain after fall today. Initial encounter. EXAM: RIGHT KNEE - 1-2 VIEW COMPARISON:  None. FINDINGS: There is no acute bony or joint abnormality. No joint effusion. Soft tissues anterior to the patella appear mildly swollen which may be due to contusion. IMPRESSION: Negative for fracture. Soft tissue swelling anterior to the patella could be due to contusion. Electronically Signed   By: Inge Rise M.D.   On: 12/11/2019 15:44   DG Ankle Complete Right  Result Date: 12/11/2019 CLINICAL DATA:  Right ankle pain after a fall today. Initial encounter. EXAM: RIGHT ANKLE - COMPLETE 3+ VIEW COMPARISON:  None. FINDINGS: There is soft tissue swelling about the ankle. The patient has a nondisplaced and incomplete appearing fracture of the distal diaphysis of the fibula. No other acute bony abnormality is seen. IMPRESSION: Nondisplaced and  incomplete fracture of the distal diaphysis of the fibula. Soft tissue swelling. Electronically Signed   By: Inge Rise M.D.   On: 12/11/2019 15:42   CT HEAD WO CONTRAST  Result Date: 12/15/2019 CLINICAL DATA:  Left-sided weakness. EXAM: CT HEAD WITHOUT CONTRAST TECHNIQUE: Contiguous axial images were obtained from the base of the skull through the vertex without intravenous contrast. COMPARISON:  Head CT scan 12/11/2019.  Brain MRI 07/31/2009. FINDINGS: Brain: No evidence of acute infarction, hemorrhage, hydrocephalus, extra-axial collection or mass lesion/mass effect. Remote left ACA and left posterior watershed territory infarcts again seen. Atrophy and chronic microvascular ischemic change noted. Vascular: Atherosclerosis. Skull: No fracture. Small area of increased density in the left sphenoid wing is noted as per seen on the prior head CT.  Sinuses/Orbits: Mild mucosal thickening left maxillary sinus. Other: None. IMPRESSION: No acute abnormality or change compared to the prior exam. Atrophy, chronic microvascular ischemic change remote left-sided infarcts. Atherosclerosis. Mild mucosal thickening left maxillary sinus. Electronically Signed   By: Inge Rise M.D.   On: 12/15/2019 12:50   CT Head Wo Contrast  Result Date: 12/11/2019 CLINICAL DATA:  Neuro deficit(s), subacute. Additional history provided: Patient presents after fall at home, clammy and lethargic, had fallen a few days ago EXAM: CT HEAD WITHOUT CONTRAST TECHNIQUE: Contiguous axial images were obtained from the base of the skull through the vertex without intravenous contrast. COMPARISON:  MRI/MRA head 07/31/2009, head CT 07/30/2009 FINDINGS: Brain: Again demonstrated are now chronic cortical/subcortical infarcts involving the left anterior cerebral artery vascular territory as well as left anterior cerebral artery/middle cerebral artery vascular watershed territory. These infarcts involve the left frontal lobe, left parietooccipital lobes and posterior left temporal lobe. No acute infarct is identified. No evidence of acute intracranial hemorrhage. No midline shift or extra-axial fluid collection. No evidence of intracranial mass. Ill-defined hypoattenuation within the cerebral white matter is nonspecific, but consistent with chronic small vessel ischemic disease. Mild generalized parenchymal atrophy. Vascular: No hyperdense vessel.  Atherosclerotic calcifications. Skull: No calvarial fracture. New from prior examination in 2010, there is a heterogeneous, sclerotic and ground-glass appearance within portions of the left sphenoid wing. This is indeterminate. Sinuses/Orbits: Visualized orbits demonstrate no acute abnormality. Mild scattered paranasal sinus mucosal thickening greatest within the bilateral ethmoid air cells. No significant mastoid effusion. IMPRESSION: No CT evidence  of acute intracranial abnormality. Redemonstrated now chronic cortical/subcortical infarcts within the left ACA territory and ACA/MCA watershed territory as described. Generalized parenchymal atrophy and chronic small vessel ischemic disease. New from prior examination in 2010, there is a heterogeneous, sclerotic and ground-glass appearance within portions of the left sphenoid wing. This is nonspecific and of unclear clinical significance. Nonemergent contrast-enhanced brain MRI is recommended for further evaluation, as clinically warranted. Electronically Signed   By: Kellie Simmering DO   On: 12/11/2019 16:59   US RENAL  Result Date: 12/11/2019 CLINICAL DATA:  Hypertension EXAM: RENAL / URINARY TRACT ULTRASOUND COMPLETE COMPARISON:  None. FINDINGS: Right Kidney: Renal measurements: 9.4 x 4.2 x 4.3 cm. = volume: 89 mL. Slight increased echogenicity is noted. No hydronephrosis is seen. Left Kidney: Renal measurements: 10.0 x 5.1 x 4.7 cm = volume: 125 mL. Multiple cysts are noted. The largest of these lies in the lower pole of the left kidney medially. No obstructive changes are seen. Mild increased echogenicity is noted. Bladder: Appears normal for degree of bladder distention. Other: None. IMPRESSION: Left renal cysts. Increased echogenicity consistent with medical renal disease. Electronically Signed   By: Inez Catalina  M.D.   On: 12/11/2019 20:45   DG Foot Complete Right  Result Date: 12/11/2019 CLINICAL DATA:  Right foot pain after a fall today. Initial encounter. EXAM: RIGHT FOOT COMPLETE - 3+ VIEW COMPARISON:  None. FINDINGS: Bones are osteopenic. No acute bony or joint abnormality is seen. Soft tissues are diffusely swollen. IMPRESSION: No acute bony abnormality. Diffuse soft tissue swelling. Osteopenia. Electronically Signed   By: Inge Rise M.D.   On: 12/11/2019 15:43   DG C-Arm 1-60 Min  Result Date: 12/12/2019 CLINICAL DATA:  Right intertrochanteric femur fracture ORIF. EXAM: RIGHT FEMUR 2  VIEWS; DG C-ARM 1-60 MIN COMPARISON:  Right hip x-rays from yesterday. FLUOROSCOPY TIME:  57 seconds. C-arm fluoroscopic images were obtained intraoperatively and submitted for post operative interpretation. FINDINGS: Multiple intraoperative fluoroscopic images demonstrate interval cephalomedullary nail fixation of the right intertrochanteric femur fracture with improved alignment of the dominant fracture fragments. The lesser trochanter fragment remains displaced. IMPRESSION: Intraoperative fluoroscopic guidance for right intertrochanteric femur fracture ORIF. Electronically Signed   By: Titus Dubin M.D.   On: 12/12/2019 13:45   DG Hip Unilat W or Wo Pelvis 2-3 Views Right  Result Date: 12/11/2019 CLINICAL DATA:  Right hip pain after a fall today. Initial encounter. EXAM: DG HIP (WITH OR WITHOUT PELVIS) 2-3V RIGHT COMPARISON:  None. FINDINGS: The patient has an acute right intertrochanteric fracture with subtrochanteric extension. The lesser trochanter is a separate fragment. No other acute abnormality is identified. IMPRESSION: Acute right intertrochanteric fracture with subtrochanteric extension. Electronically Signed   By: Inge Rise M.D.   On: 12/11/2019 15:43   DG FEMUR, MIN 2 VIEWS RIGHT  Result Date: 12/12/2019 CLINICAL DATA:  Right intertrochanteric femur fracture ORIF. EXAM: RIGHT FEMUR 2 VIEWS; DG C-ARM 1-60 MIN COMPARISON:  Right hip x-rays from yesterday. FLUOROSCOPY TIME:  57 seconds. C-arm fluoroscopic images were obtained intraoperatively and submitted for post operative interpretation. FINDINGS: Multiple intraoperative fluoroscopic images demonstrate interval cephalomedullary nail fixation of the right intertrochanteric femur fracture with improved alignment of the dominant fracture fragments. The lesser trochanter fragment remains displaced. IMPRESSION: Intraoperative fluoroscopic guidance for right intertrochanteric femur fracture ORIF. Electronically Signed   By: Titus Dubin  M.D.   On: 12/12/2019 13:45   VAS [place] LOWER EXTREMITY VENOUS (DVT)  Result Date: 12/12/2019  Lower Venous Study Indications: Pain.  Limitations: Patient positioning. Performing Technologist: Abram Sander RVS  Examination Guidelines: A complete evaluation includes B-mode imaging, spectral Doppler, color Doppler, and power Doppler as needed of all accessible portions of each vessel. Bilateral testing is considered an integral part of a complete examination. Limited examinations for reoccurring indications may be performed as noted.  +---------+---------------+---------+-----------+----------+--------------+ RIGHT    CompressibilityPhasicitySpontaneityPropertiesThrombus Aging +---------+---------------+---------+-----------+----------+--------------+ CFV      Full           Yes      Yes                                 +---------+---------------+---------+-----------+----------+--------------+ SFJ      Full                                                        +---------+---------------+---------+-----------+----------+--------------+ FV Prox  Full                                                        +---------+---------------+---------+-----------+----------+--------------+  FV Mid                  Yes      Yes                                 +---------+---------------+---------+-----------+----------+--------------+ FV Distal               Yes      Yes                                 +---------+---------------+---------+-----------+----------+--------------+ PFV      Full                                                        +---------+---------------+---------+-----------+----------+--------------+ POP                     Yes      Yes                                 +---------+---------------+---------+-----------+----------+--------------+ PTV      Full                                                         +---------+---------------+---------+-----------+----------+--------------+ PERO                                                  Not visualized +---------+---------------+---------+-----------+----------+--------------+   +---------+---------------+---------+-----------+----------+--------------+ LEFT     CompressibilityPhasicitySpontaneityPropertiesThrombus Aging +---------+---------------+---------+-----------+----------+--------------+ CFV      Full           Yes      Yes                                 +---------+---------------+---------+-----------+----------+--------------+ SFJ      Full                                                        +---------+---------------+---------+-----------+----------+--------------+ FV Prox  Full                                                        +---------+---------------+---------+-----------+----------+--------------+ FV Mid   Full                                                        +---------+---------------+---------+-----------+----------+--------------+  FV DistalFull                                                        +---------+---------------+---------+-----------+----------+--------------+ PFV      Full                                                        +---------+---------------+---------+-----------+----------+--------------+ POP      Full           Yes      Yes                                 +---------+---------------+---------+-----------+----------+--------------+ PTV      Full                                                        +---------+---------------+---------+-----------+----------+--------------+ PERO     Full                                                        +---------+---------------+---------+-----------+----------+--------------+     Summary: Right: There is no evidence of deep vein thrombosis in the lower extremity. However, portions of this  examination were limited- see technologist comments above. No cystic structure found in the popliteal fossa. Left: There is no evidence of deep vein thrombosis in the lower extremity. No cystic structure found in the popliteal fossa.  *See table(s) above for measurements and observations. Electronically signed by Harold Barban MD on 12/12/2019 at 7:39:15 PM.    Final    US Abdomen Limited RUQ  Result Date: 12/11/2019 CLINICAL DATA:  Elevated LFTs and bilirubin EXAM: ULTRASOUND ABDOMEN LIMITED RIGHT UPPER QUADRANT COMPARISON:  None. FINDINGS: Gallbladder: No gallstones or wall thickening visualized. No sonographic Murphy sign noted by sonographer. Common bile duct: Diameter: 2.7 mm. Liver: 1.4 cm cyst in the left lobe of the liver is noted. No other focal hepatic abnormality is seen. Portal vein is patent on color Doppler imaging with normal direction of blood flow towards the liver. Other: None. IMPRESSION: Left hepatic cyst.  No other focal abnormality is seen. Electronically Signed   By: Inez Catalina M.D.   On: 12/11/2019 20:44       Subjective: Patient seen and examined resting.  Poor historian.  Denies worsening hip pain.  No overnight fever or vomiting reported.  Discharge Exam: Vitals:   12/20/19 0338 12/20/19 0754  BP: (!) 144/74 (!) 145/60  Pulse: 72 79  Resp: 17 18  Temp: 98.2 F (36.8 C) 98.6 F (37 C)  SpO2: 100% 100%    General: Pt is alert, awake, not in acute distress.  Poor historian Cardiovascular: rate controlled, S1/S2 + Respiratory: bilateral decreased breath sounds at bases Abdominal: Soft, NT, ND, bowel sounds + Extremities: Trace lower  extremity edema, no cyanosis    The results of significant diagnostics from this hospitalization (including imaging, microbiology, ancillary and laboratory) are listed below for reference.     Microbiology: Recent Results (from the past 240 hour(s))  Surgical pcr screen     Status: Abnormal   Collection Time: 12/11/19  9:24 PM    Specimen: Nasal Mucosa; Nasal Swab  Result Value Ref Range Status   MRSA, PCR NEGATIVE NEGATIVE Final   Staphylococcus aureus POSITIVE (A) NEGATIVE Final    Comment: (NOTE) The Xpert SA Assay (FDA approved for NASAL specimens in patients 31 years of age and older), is one component of a comprehensive surveillance program. It is not intended to diagnose infection nor to guide or monitor treatment. Performed at [facility], [place] [address] [place], [place] [address]   SARS CORONAVIRUS 2 (TAT 6-24 HRS) Nasopharyngeal Nasopharyngeal Swab     Status: None   Collection Time: 12/11/19  9:45 PM   Specimen: Nasopharyngeal Swab  Result Value Ref Range Status   SARS Coronavirus 2 NEGATIVE NEGATIVE Final    Comment: (NOTE) SARS-CoV-2 target nucleic acids are NOT DETECTED. The SARS-CoV-2 RNA is generally detectable in upper and lower respiratory specimens during the acute phase of infection. Negative results do not preclude SARS-CoV-2 infection, do not rule out co-infections with other pathogens, and should not be used as the sole basis for treatment or other patient management decisions. Negative results must be combined with clinical observations, patient history, and epidemiological information. The expected result is Negative. Fact Sheet for Patients: SugarRoll.be Fact Sheet for Healthcare Providers: https://www.woods-mathews.com/ This test is not yet approved or cleared by the [place] FDA and  has been authorized for detection and/or diagnosis of SARS-CoV-2 by FDA under an Emergency Use Authorization (EUA). This EUA will remain  in effect (meaning this test can be used) for the duration of the COVID-19 declaration under Section 56 4(b)(1) of the Act, 21 U.S.C. section 360bbb-3(b)(1), unless the authorization is terminated or revoked sooner. Performed at [facility], [place] [address] [place], [place] [address]       Labs: BNP (last 3 results) No results for input(s): BNP in the last 8760 hours. Basic Metabolic Panel: Recent Labs  Lab 12/14/19 0322 12/15/19 0812 12/16/19 0404 12/17/19 0322 12/18/19 0323 12/19/19 0455  NA 139 135 138 137 137 138  K 3.3* 3.7 4.3 4.4 4.1 4.0  CL 104 102 103 102 104 103  CO2 24 23 24 24 24 24   GLUCOSE 101* 137* 98 105* 99 106*  BUN 32* 26* 24* 25* 32* 30*  CREATININE 2.32* 2.36* 2.27* 2.33* 2.37* 2.26*  2.36*  CALCIUM 7.4* 7.4* 7.9* 8.2* 8.0* 8.2*  MG 1.4*  --  1.6* 1.8 1.9 1.9  PHOS 2.1* 2.8 2.7 3.0  --  3.4   Liver Function Tests: Recent Labs  Lab 12/15/19 0812 12/16/19 0404 12/17/19 0322 12/18/19 0323 12/19/19 0455  AST  --   --   --  39  --   ALT  --   --   --  15  --   ALKPHOS  --   --   --  60  --   BILITOT  --   --   --  0.6  --   PROT  --   --   --  5.5*  --   ALBUMIN 2.4* 2.4* 2.4* 2.3* 2.4*   No results for input(s): LIPASE, AMYLASE in the last 168 hours. No  results for input(s): AMMONIA in the last 168 hours. CBC: Recent Labs  Lab 12/15/19 0812 12/16/19 0404 12/17/19 0322 12/18/19 0323 12/19/19 0455  WBC 11.1* 10.4 14.1* 11.6* 11.7*  NEUTROABS 8.4* 7.8* 11.5* 8.7* 9.1*  HGB 8.3* 7.9* 8.2* 8.1* 8.2*  HCT 24.8* 24.1* 24.7* 24.3* 25.2*  MCV 87.6 87.6 86.7 86.8 88.4  PLT 220 241 265 266 293   Cardiac Enzymes: No results for input(s): CKTOTAL, CKMB, CKMBINDEX, TROPONINI in the last 168 hours. BNP: Invalid input(s): POCBNP CBG: No results for input(s): GLUCAP in the last 168 hours. D-Dimer No results for input(s): DDIMER in the last 72 hours. Hgb A1c No results for input(s): HGBA1C in the last 72 hours. Lipid Profile No results for input(s): CHOL, HDL, LDLCALC, TRIG, CHOLHDL, LDLDIRECT in the last 72 hours. Thyroid function studies No results for input(s): TSH, T4TOTAL, T3FREE, THYROIDAB in the last 72 hours.  Invalid input(s): FREET3 Anemia work up No results for input(s): VITAMINB12, FOLATE, FERRITIN, TIBC, IRON,  RETICCTPCT in the last 72 hours. Urinalysis    Component Value Date/Time   COLORURINE YELLOW 12/11/2019 1410   APPEARANCEUR HAZY (A) 12/11/2019 1410   LABSPEC 1.012 12/11/2019 1410   PHURINE 5.0 12/11/2019 1410   GLUCOSEU NEGATIVE 12/11/2019 1410   HGBUR LARGE (A) 12/11/2019 1410   BILIRUBINUR NEGATIVE 12/11/2019 1410   BILIRUBINUR Negative 03/29/2018 1009   KETONESUR NEGATIVE 12/11/2019 1410   PROTEINUR 30 (A) 12/11/2019 1410   UROBILINOGEN 0.2 03/29/2018 1009   UROBILINOGEN 0.2 08/05/2009 2334   NITRITE NEGATIVE 12/11/2019 1410   LEUKOCYTESUR TRACE (A) 12/11/2019 1410   Sepsis Labs Invalid input(s): PROCALCITONIN,  WBC,  LACTICIDVEN Microbiology Recent Results (from the past 240 hour(s))  Surgical pcr screen     Status: Abnormal   Collection Time: 12/11/19  9:24 PM   Specimen: Nasal Mucosa; Nasal Swab  Result Value Ref Range Status   MRSA, PCR NEGATIVE NEGATIVE Final   Staphylococcus aureus POSITIVE (A) NEGATIVE Final    Comment: (NOTE) The Xpert SA Assay (FDA approved for NASAL specimens in patients 33 years of age and older), is one component of a comprehensive surveillance program. It is not intended to diagnose infection nor to guide or monitor treatment. Performed at [facility], [place] [address] [place], [place] [address]   SARS CORONAVIRUS 2 (TAT 6-24 HRS) Nasopharyngeal Nasopharyngeal Swab     Status: None   Collection Time: 12/11/19  9:45 PM   Specimen: Nasopharyngeal Swab  Result Value Ref Range Status   SARS Coronavirus 2 NEGATIVE NEGATIVE Final    Comment: (NOTE) SARS-CoV-2 target nucleic acids are NOT DETECTED. The SARS-CoV-2 RNA is generally detectable in upper and lower respiratory specimens during the acute phase of infection. Negative results do not preclude SARS-CoV-2 infection, do not rule out co-infections with other pathogens, and should not be used as the sole basis for treatment or other patient management decisions. Negative results  must be combined with clinical observations, patient history, and epidemiological information. The expected result is Negative. Fact Sheet for Patients: SugarRoll.be Fact Sheet for Healthcare Providers: https://www.woods-mathews.com/ This test is not yet approved or cleared by the [place] FDA and  has been authorized for detection and/or diagnosis of SARS-CoV-2 by FDA under an Emergency Use Authorization (EUA). This EUA will remain  in effect (meaning this test can be used) for the duration of the COVID-19 declaration under Section 56 4(b)(1) of the Act, 21 U.S.C. section 360bbb-3(b)(1), unless the authorization is terminated or revoked sooner. Performed  at [facility], [place] [address] [place], [place] [address]      Time coordinating discharge: 35 minutes  SIGNED:   Aline August, MD  [facility] 12/20/2019, 12:39 PM

## 2019-12-20 NOTE — Progress Notes (Signed)
12/19/19 at 19:15, noted multiple skin tear at patient button during pericare, barrier cream and Maplex  Dressing applied, will continue to monitor

## 2019-12-20 NOTE — Care Management Important Message (Signed)
Important Message  Patient Details  Name: Daniel [name] MRN: [id number] Date of Birth: [birth year]   Medicare Important Message Given:  Yes     Memory Argue 12/20/2019, 11:55 AM

## 2019-12-22 ENCOUNTER — Telehealth: Payer: Self-pay

## 2019-12-22 DIAGNOSIS — I69351 Hemiplegia and hemiparesis following cerebral infarction affecting right dominant side: Secondary | ICD-10-CM | POA: Diagnosis not present

## 2019-12-22 DIAGNOSIS — N183 Chronic kidney disease, stage 3 unspecified: Secondary | ICD-10-CM | POA: Diagnosis not present

## 2019-12-22 DIAGNOSIS — S82891D Other fracture of right lower leg, subsequent encounter for closed fracture with routine healing: Secondary | ICD-10-CM | POA: Diagnosis not present

## 2019-12-22 DIAGNOSIS — F209 Schizophrenia, unspecified: Secondary | ICD-10-CM | POA: Diagnosis not present

## 2019-12-22 DIAGNOSIS — F1911 Other psychoactive substance abuse, in remission: Secondary | ICD-10-CM | POA: Diagnosis not present

## 2019-12-22 DIAGNOSIS — D631 Anemia in chronic kidney disease: Secondary | ICD-10-CM | POA: Diagnosis not present

## 2019-12-22 DIAGNOSIS — I739 Peripheral vascular disease, unspecified: Secondary | ICD-10-CM | POA: Diagnosis not present

## 2019-12-22 DIAGNOSIS — I129 Hypertensive chronic kidney disease with stage 1 through stage 4 chronic kidney disease, or unspecified chronic kidney disease: Secondary | ICD-10-CM | POA: Diagnosis not present

## 2019-12-22 DIAGNOSIS — S72141D Displaced intertrochanteric fracture of right femur, subsequent encounter for closed fracture with routine healing: Secondary | ICD-10-CM | POA: Diagnosis not present

## 2019-12-22 NOTE — Progress Notes (Signed)
Esmeralda Arthur, niece of Pt, picked up [place] and 3in1.

## 2019-12-22 NOTE — Telephone Encounter (Signed)
Called and left message for patient or family to return call in regards to patient's recent hospitalization

## 2019-12-26 DIAGNOSIS — I129 Hypertensive chronic kidney disease with stage 1 through stage 4 chronic kidney disease, or unspecified chronic kidney disease: Secondary | ICD-10-CM | POA: Diagnosis not present

## 2019-12-26 DIAGNOSIS — F1911 Other psychoactive substance abuse, in remission: Secondary | ICD-10-CM | POA: Diagnosis not present

## 2019-12-26 DIAGNOSIS — S82891D Other fracture of right lower leg, subsequent encounter for closed fracture with routine healing: Secondary | ICD-10-CM | POA: Diagnosis not present

## 2019-12-26 DIAGNOSIS — I739 Peripheral vascular disease, unspecified: Secondary | ICD-10-CM | POA: Diagnosis not present

## 2019-12-26 DIAGNOSIS — F209 Schizophrenia, unspecified: Secondary | ICD-10-CM | POA: Diagnosis not present

## 2019-12-26 DIAGNOSIS — N183 Chronic kidney disease, stage 3 unspecified: Secondary | ICD-10-CM | POA: Diagnosis not present

## 2019-12-26 DIAGNOSIS — I69351 Hemiplegia and hemiparesis following cerebral infarction affecting right dominant side: Secondary | ICD-10-CM | POA: Diagnosis not present

## 2019-12-26 DIAGNOSIS — S72141D Displaced intertrochanteric fracture of right femur, subsequent encounter for closed fracture with routine healing: Secondary | ICD-10-CM | POA: Diagnosis not present

## 2019-12-26 DIAGNOSIS — D631 Anemia in chronic kidney disease: Secondary | ICD-10-CM | POA: Diagnosis not present

## 2019-12-27 ENCOUNTER — Emergency Department (HOSPITAL_COMMUNITY)
Admission: EM | Admit: 2019-12-27 | Discharge: 2020-01-01 | Disposition: A | Discharge status: Other Acute Inpatient Hospital | Payer: Medicare PPO | Attending: Emergency Medicine | Admitting: Emergency Medicine

## 2019-12-27 ENCOUNTER — Encounter (HOSPITAL_COMMUNITY): Payer: Self-pay | Admitting: Emergency Medicine

## 2019-12-27 ENCOUNTER — Emergency Department (HOSPITAL_COMMUNITY): Payer: Medicare PPO

## 2019-12-27 ENCOUNTER — Ambulatory Visit (HOSPITAL_BASED_OUTPATIENT_CLINIC_OR_DEPARTMENT_OTHER)
Admission: RE | Admit: 2019-12-27 | Discharge: 2019-12-27 | Disposition: A | Discharge status: Home / Self Care | Payer: Medicare PPO | Source: Ambulatory Visit | Attending: Emergency Medicine | Admitting: Emergency Medicine

## 2019-12-27 DIAGNOSIS — R2241 Localized swelling, mass and lump, right lower limb: Secondary | ICD-10-CM | POA: Diagnosis not present

## 2019-12-27 DIAGNOSIS — I69351 Hemiplegia and hemiparesis following cerebral infarction affecting right dominant side: Secondary | ICD-10-CM | POA: Diagnosis not present

## 2019-12-27 DIAGNOSIS — I82411 Acute embolism and thrombosis of right femoral vein: Secondary | ICD-10-CM | POA: Diagnosis not present

## 2019-12-27 DIAGNOSIS — S72141A Displaced intertrochanteric fracture of right femur, initial encounter for closed fracture: Secondary | ICD-10-CM | POA: Diagnosis not present

## 2019-12-27 DIAGNOSIS — N183 Chronic kidney disease, stage 3 unspecified: Secondary | ICD-10-CM | POA: Insufficient documentation

## 2019-12-27 DIAGNOSIS — Z7902 Long term (current) use of antithrombotics/antiplatelets: Secondary | ICD-10-CM | POA: Diagnosis not present

## 2019-12-27 DIAGNOSIS — R609 Edema, unspecified: Secondary | ICD-10-CM | POA: Diagnosis not present

## 2019-12-27 DIAGNOSIS — Z20822 Contact with and (suspected) exposure to covid-19: Secondary | ICD-10-CM | POA: Diagnosis not present

## 2019-12-27 DIAGNOSIS — Z7901 Long term (current) use of anticoagulants: Secondary | ICD-10-CM | POA: Diagnosis not present

## 2019-12-27 DIAGNOSIS — I129 Hypertensive chronic kidney disease with stage 1 through stage 4 chronic kidney disease, or unspecified chronic kidney disease: Secondary | ICD-10-CM | POA: Diagnosis not present

## 2019-12-27 DIAGNOSIS — D631 Anemia in chronic kidney disease: Secondary | ICD-10-CM | POA: Diagnosis not present

## 2019-12-27 DIAGNOSIS — S72141D Displaced intertrochanteric fracture of right femur, subsequent encounter for closed fracture with routine healing: Secondary | ICD-10-CM | POA: Diagnosis not present

## 2019-12-27 DIAGNOSIS — Z03818 Encounter for observation for suspected exposure to other biological agents ruled out: Secondary | ICD-10-CM | POA: Diagnosis not present

## 2019-12-27 DIAGNOSIS — I739 Peripheral vascular disease, unspecified: Secondary | ICD-10-CM | POA: Diagnosis not present

## 2019-12-27 DIAGNOSIS — R52 Pain, unspecified: Secondary | ICD-10-CM | POA: Diagnosis not present

## 2019-12-27 DIAGNOSIS — F17210 Nicotine dependence, cigarettes, uncomplicated: Secondary | ICD-10-CM | POA: Diagnosis not present

## 2019-12-27 DIAGNOSIS — S82891D Other fracture of right lower leg, subsequent encounter for closed fracture with routine healing: Secondary | ICD-10-CM | POA: Diagnosis not present

## 2019-12-27 DIAGNOSIS — F209 Schizophrenia, unspecified: Secondary | ICD-10-CM | POA: Diagnosis not present

## 2019-12-27 DIAGNOSIS — Z79899 Other long term (current) drug therapy: Secondary | ICD-10-CM | POA: Diagnosis not present

## 2019-12-27 DIAGNOSIS — L89899 Pressure ulcer of other site, unspecified stage: Secondary | ICD-10-CM | POA: Insufficient documentation

## 2019-12-27 DIAGNOSIS — Z96641 Presence of right artificial hip joint: Secondary | ICD-10-CM

## 2019-12-27 DIAGNOSIS — I10 Essential (primary) hypertension: Secondary | ICD-10-CM | POA: Diagnosis not present

## 2019-12-27 DIAGNOSIS — Z471 Aftercare following joint replacement surgery: Secondary | ICD-10-CM | POA: Diagnosis not present

## 2019-12-27 DIAGNOSIS — F1911 Other psychoactive substance abuse, in remission: Secondary | ICD-10-CM | POA: Diagnosis not present

## 2019-12-27 DIAGNOSIS — L8990 Pressure ulcer of unspecified site, unspecified stage: Secondary | ICD-10-CM | POA: Insufficient documentation

## 2019-12-27 DIAGNOSIS — M79604 Pain in right leg: Secondary | ICD-10-CM | POA: Diagnosis not present

## 2019-12-27 LAB — CBC WITH DIFFERENTIAL/PLATELET
Abs Immature Granulocytes: 0.05 10*3/uL (ref 0.00–0.07)
Basophils Absolute: 0 10*3/uL (ref 0.0–0.1)
Basophils Relative: 0 %
Eosinophils Absolute: 0 10*3/uL (ref 0.0–0.5)
Eosinophils Relative: 0 %
HCT: 26.6 % — ABNORMAL LOW (ref 39.0–52.0)
Hemoglobin: 8.3 g/dL — ABNORMAL LOW (ref 13.0–17.0)
Immature Granulocytes: 1 %
Lymphocytes Relative: 5 %
Lymphs Abs: 0.6 10*3/uL — ABNORMAL LOW (ref 0.7–4.0)
MCH: 28.6 pg (ref 26.0–34.0)
MCHC: 31.2 g/dL (ref 30.0–36.0)
MCV: 91.7 fL (ref 80.0–100.0)
Monocytes Absolute: 1.1 10*3/uL — ABNORMAL HIGH (ref 0.1–1.0)
Monocytes Relative: 10 %
Neutro Abs: 9.3 10*3/uL — ABNORMAL HIGH (ref 1.7–7.7)
Neutrophils Relative %: 84 %
Platelets: 559 10*3/uL — ABNORMAL HIGH (ref 150–400)
RBC: 2.9 MIL/uL — ABNORMAL LOW (ref 4.22–5.81)
RDW: 15.3 % (ref 11.5–15.5)
WBC: 11 10*3/uL — ABNORMAL HIGH (ref 4.0–10.5)
nRBC: 0 % (ref 0.0–0.2)

## 2019-12-27 LAB — COMPREHENSIVE METABOLIC PANEL
ALT: 21 U/L (ref 0–44)
AST: 27 U/L (ref 15–41)
Albumin: 3 g/dL — ABNORMAL LOW (ref 3.5–5.0)
Alkaline Phosphatase: 104 U/L (ref 38–126)
Anion gap: 13 (ref 5–15)
BUN: 32 mg/dL — ABNORMAL HIGH (ref 8–23)
CO2: 23 mmol/L (ref 22–32)
Calcium: 8.8 mg/dL — ABNORMAL LOW (ref 8.9–10.3)
Chloride: 104 mmol/L (ref 98–111)
Creatinine, Ser: 2.74 mg/dL — ABNORMAL HIGH (ref 0.61–1.24)
GFR calc Af Amer: 25 mL/min — ABNORMAL LOW (ref >60)
GFR calc non Af Amer: 22 mL/min — ABNORMAL LOW (ref >60)
Glucose, Bld: 114 mg/dL — ABNORMAL HIGH (ref 70–99)
Potassium: 4.6 mmol/L (ref 3.5–5.1)
Sodium: 140 mmol/L (ref 135–145)
Total Bilirubin: 1 mg/dL (ref 0.3–1.2)
Total Protein: 6.8 g/dL (ref 6.5–8.1)

## 2019-12-27 LAB — URINALYSIS, ROUTINE W REFLEX MICROSCOPIC
Bilirubin Urine: NEGATIVE
Glucose, UA: NEGATIVE mg/dL
Hgb urine dipstick: NEGATIVE
Ketones, ur: NEGATIVE mg/dL
Leukocytes,Ua: NEGATIVE
Nitrite: NEGATIVE
Protein, ur: 30 mg/dL — AB
Specific Gravity, Urine: 1.01 (ref 1.005–1.030)
pH: 7 (ref 5.0–8.0)

## 2019-12-27 LAB — PROTIME-INR
INR: 1.1 (ref 0.8–1.2)
Prothrombin Time: 14.1 seconds (ref 11.4–15.2)

## 2019-12-27 MED ORDER — LISINOPRIL 20 MG PO TABS
20.0000 mg | ORAL_TABLET | Freq: Every day | ORAL | Status: DC
  Administered 2019-12-28 – 2020-01-01 (×5): 20 mg via ORAL
  Filled 2019-12-27 (×5): qty 1

## 2019-12-27 MED ORDER — CLOPIDOGREL BISULFATE 75 MG PO TABS
75.0000 mg | ORAL_TABLET | Freq: Every day | ORAL | Status: DC
  Administered 2019-12-28 – 2020-01-01 (×5): 75 mg via ORAL
  Filled 2019-12-27 (×5): qty 1

## 2019-12-27 MED ORDER — APIXABAN 5 MG PO TABS: 5.0000 mg | ORAL_TABLET | Freq: Two times a day (BID) | ORAL | Status: DC

## 2019-12-27 MED ORDER — ATORVASTATIN CALCIUM 40 MG PO TABS
40.0000 mg | ORAL_TABLET | Freq: Every day | ORAL | Status: DC
  Administered 2019-12-28 – 2020-01-01 (×5): 40 mg via ORAL
  Filled 2019-12-27 (×5): qty 1

## 2019-12-27 MED ORDER — METOPROLOL TARTRATE 25 MG PO TABS
100.0000 mg | ORAL_TABLET | Freq: Two times a day (BID) | ORAL | Status: DC
  Administered 2019-12-28 – 2020-01-01 (×10): 100 mg via ORAL
  Filled 2019-12-27 (×10): qty 4

## 2019-12-27 MED ORDER — HYDRALAZINE HCL 25 MG PO TABS
50.0000 mg | ORAL_TABLET | Freq: Three times a day (TID) | ORAL | Status: DC
  Administered 2019-12-28 – 2020-01-01 (×13): 50 mg via ORAL
  Filled 2019-12-27 (×13): qty 2

## 2019-12-27 MED ORDER — AMLODIPINE BESYLATE 5 MG PO TABS
10.0000 mg | ORAL_TABLET | Freq: Every day | ORAL | Status: DC
  Administered 2019-12-28 – 2020-01-01 (×5): 10 mg via ORAL
  Filled 2019-12-27 (×5): qty 2

## 2019-12-27 MED ORDER — FOLIC ACID 1 MG PO TABS
1.0000 mg | ORAL_TABLET | Freq: Every day | ORAL | Status: DC
  Administered 2019-12-28 – 2020-01-01 (×5): 1 mg via ORAL
  Filled 2019-12-27 (×5): qty 1

## 2019-12-27 MED ORDER — APIXABAN 5 MG PO TABS
10.0000 mg | ORAL_TABLET | Freq: Two times a day (BID) | ORAL | Status: DC
  Administered 2019-12-27 – 2020-01-01 (×9): 10 mg via ORAL
  Filled 2019-12-27 (×13): qty 2

## 2019-12-27 NOTE — ED Notes (Signed)
Transferred to Xray

## 2019-12-27 NOTE — ED Notes (Signed)
Severe edema noted to RLE. Warm to touch.

## 2019-12-27 NOTE — Social Work (Signed)
EDCSW met with Pt at bedside. Pt stated that he is willing to go to SNF for rehab if it is medically recommended. At Pts request, CSW provided a sandwich.

## 2019-12-27 NOTE — ED Notes (Signed)
Social work at bedside at this time-Monique,RN

## 2019-12-27 NOTE — ED Provider Notes (Signed)
[facility] EMERGENCY DEPARTMENT Provider Note   CSN: [id number] Arrival date & time: 12/27/19  1603     History Chief Complaint  Patient presents with  . Leg Pain    Daniel [name] is a 74 y.o. male with a pmh of HTn, schizophrenia, peripheral vascular disease, and stroke with R sided hemiparesis. Patient is chronically anticoagulated on plavix.there is a level 5 caveat due to schizophrenia.  The patient is bib EMS for R leg pain and swelling.  Patient was seen on December 11, 2019 for evaluation of multiple falls at home.  He was found to have a right intertrochanteric and subtrochanteric fracture of the right hip and a nondisplaced incomplete fracture of the distal diaphysis of his right fibula.  Patient underwent an ORIF by Dr. Fredonia Highland on 12/12/2019.  Patient has been receiving home health care and lives with his Daniel [name].  Cording to EMS the patient was found today sitting in a pile of his own stool and urine unable to ambulate.  He is complaining of severe pain in the right leg.  Review of EMR shows that the patient did have significant right leg swelling back on 12/11/2019 which continues today.  At that time he had a negative DVT study however this was prior to the ORIF of his leg.  Patient is a poor historian which limits the intake.  He denies using any blood thinners however he does appear to be on Plavix.  HPI     Past Medical History:  Diagnosis Date  . Hypertension   . Psychosis ([place])   . Schizophrenia ([place]) 1974  . Stroke [facility]) 2010   Right Hemiparesis    Patient Active Problem List   Diagnosis Date Noted  . Closed displaced intertrochanteric fracture of right femur ([place]) 12/11/2019  . LFT elevation   . AKI (acute kidney injury) ([place])   . Nicotine dependence with current use 03/29/2018  . Poor dentition 01/17/2015  . Peripheral vascular disease ([place]) 01/18/2014  . Benign hypertension with CKD (chronic kidney disease) stage III 01/18/2014  .  Hyperlipidemia 08/17/2011  . Hemiplegia, late effect of cerebrovascular disease ([place]) 10/17/2009  . Anemia 10/16/2008  . Substance abuse in remission ([place]) 10/16/2008  . Schizophrenia ([place]) 12/07/1972    Past Surgical History:  Procedure Laterality Date  . FEMUR IM NAIL Right 12/12/2019   Procedure: INTRAMEDULLARY (IM) NAIL FEMORAL;  Surgeon: Renette Butters, MD;  Location: [place] [facility];  Service: Orthopedics;  Laterality: Right;       Family History  Problem Relation Age of Onset  . Hypertension Mother   . CVA Sister   . Breast cancer Sister   . Prostate cancer Brother     Social History   Tobacco Use  . Smoking status: Current Every Day Smoker    Packs/day: 0.10    Types: Cigarettes  . Smokeless tobacco: Never Used  . Tobacco comment: thinking about.  Cutting back 2-3 cigs per day  Substance Use Topics  . Alcohol use: Not Currently    Alcohol/week: 0.0 standard drinks  . Drug use: Not Currently    Home Medications Prior to Admission medications   Medication Sig Start Date End Date Taking? Authorizing Provider  amLODipine (NORVASC) 10 MG tablet Take 1 tablet (10 mg total) by mouth daily. 12/21/19   Aline August, MD  atorvastatin (LIPITOR) 40 MG tablet Take 1 tablet (40 mg total) by mouth daily. 05/15/19   Vivi Barrack, MD  clopidogrel (PLAVIX) 75  MG tablet Take 1 tablet (75 mg total) by mouth daily. 05/15/19   Vivi Barrack, MD  enoxaparin (LOVENOX) 30 MG/0.3ML injection Inject 0.3 mLs (30 mg total) into the skin daily. 12/12/19 01/11/20  Prudencio Burly III, PA-C  folic acid (FOLVITE) 1 MG tablet Take 1 tablet (1 mg total) by mouth daily. 05/15/19   Vivi Barrack, MD  gabapentin (NEURONTIN) 100 MG capsule TAKE 1 CAPSULE BY MOUTH THREE TIMES A DAY Patient taking differently: Take 100 mg by mouth 3 (three) times daily.  05/15/19   Vivi Barrack, MD  hydrALAZINE (APRESOLINE) 50 MG tablet Take 1 tablet (50 mg total) by mouth every 8 (eight) hours. 12/20/19   Aline August,  MD  HYDROcodone-acetaminophen (NORCO) 5-325 MG tablet Take 1 tablet by mouth every 4 (four) hours as needed for severe pain. 12/12/19   Prudencio Burly III, PA-C  lisinopril (ZESTRIL) 20 MG tablet Take 1 tablet (20 mg total) by mouth daily. 05/15/19   Vivi Barrack, MD  metoprolol tartrate (LOPRESSOR) 100 MG tablet Take 1 tablet (100 mg total) by mouth 2 (two) times daily. 05/15/19   Vivi Barrack, MD  Multiple Vitamins-Minerals (MULTIVITAMIN WITH MINERALS) tablet Take 1 tablet by mouth daily. 08/17/11   Trish Fountain, MD  polyethylene glycol (MIRALAX / GLYCOLAX) 17 g packet Take 17 g by mouth daily as needed for mild constipation. 12/20/19   Aline August, MD  senna-docusate (SENOKOT-S) 8.6-50 MG tablet Take 1 tablet by mouth 2 (two) times daily. 12/20/19   Aline August, MD    Allergies    Patient has no known allergies.  Review of Systems   Review of Systems  Unable to perform ROS: Psychiatric disorder     Physical Exam Updated Vital Signs BP (!) 154/75 (BP Location: Right Arm)   Pulse 93   Temp 99.1 F (37.3 C) (Oral)   Resp 18   SpO2 97%   Physical Exam Vitals and nursing note reviewed.  Constitutional:      General: He is not in acute distress.    Appearance: He is well-developed. He is ill-appearing. He is not diaphoretic.  HENT:     Head: Normocephalic and atraumatic.  Eyes:     General: No scleral icterus.    Conjunctiva/sclera: Conjunctivae normal.  Cardiovascular:     Rate and Rhythm: Normal rate and regular rhythm.     Heart sounds: Normal heart sounds.     Comments: dp pulse audible by doppler Pulmonary:     Effort: Pulmonary effort is normal. No respiratory distress.     Breath sounds: Normal breath sounds.  Abdominal:     Palpations: Abdomen is soft.     Tenderness: There is no abdominal tenderness.  Musculoskeletal:     Cervical back: Normal range of motion and neck supple.     Right lower leg: Edema present.  Skin:    General: Skin is  warm and dry.     Comments: Small ulcer on the Dorsum of the R foot,  3+pitting edema Onychogryphosis  Neurological:     Mental Status: He is alert.  Psychiatric:        Behavior: Behavior normal.     ED Results / Procedures / Treatments   Labs (all labs ordered are listed, but only abnormal results are displayed) Labs Reviewed - No data to display  EKG None  Radiology No results found.  Procedures Procedures (including critical care time)  Medications Ordered in ED Medications - No  data to display  ED Course  I have reviewed the triage vital signs and the nursing notes.  Pertinent labs & imaging results that were available during my care of the patient were reviewed by me and considered in my medical decision making (see chart for details).    MDM Rules/Calculators/A&P                      Patient with negative urinalysis.  CMP shows chronic renal insufficiency unchanged from previous.  White count is slightly elevated.  He has chronic normocytic anemia.  Unable to ambulate.  DVT present.  I have ordered Eliquis.  Patient will need skilled nursing facility placement.  I have gust case with Lucia Bitter who is helping to facilitate placement.  Patient in agreement.  Final Clinical Impression(s) / ED Diagnoses Final diagnoses:  DVT of deep femoral vein, right [facility])    Rx / DC Orders ED Discharge Orders    None       Margarita Mail, PA-C 12/27/19 2341    Blanchie Dessert, MD 01/01/20 1257

## 2019-12-27 NOTE — Progress Notes (Signed)
Right lower extremity venous duplex has been completed. Preliminary results can be found in CV Proc through chart review.  Results were given to Margarita Mail PA.  12/27/19 5:36 PM Carlos Levering RVT

## 2019-12-27 NOTE — ED Notes (Signed)
Per EMS pt covered in urine and stool. Pt appears to be in saturated gown.

## 2019-12-27 NOTE — ED Notes (Signed)
Daniel [name] niece/ next of kin [phone] looking for an update on the patient

## 2019-12-27 NOTE — ED Triage Notes (Signed)
BIB EMS from home. Pt reports pain to R leg. R femur fracture with ORIF 12/12/19. Called [facility] who suggested pt come to ED for evaluation. VSS.

## 2019-12-27 NOTE — Progress Notes (Addendum)
ANTICOAGULATION CONSULT NOTE - Initial Consult  Pharmacy Consult for Apixaban Indication: DVT  No Known Allergies  Vital Signs: Temp: 99.1 F (37.3 C) (01/20 1614) Temp Source: Oral (01/20 1614) BP: 154/75 (01/20 1614) Pulse Rate: 93 (01/20 1614)  Labs: Recent Labs    12/27/19 1640  HGB 8.3*  HCT 26.6*  PLT 559*  LABPROT 14.1  INR 1.1  CREATININE 2.74*    CrCl cannot be calculated (Unknown ideal weight.).   Medical History: Past Medical History:  Diagnosis Date  . Hypertension   . Psychosis ([place])   . Schizophrenia ([place]) 1974  . Stroke [facility]) 2010   Right Hemiparesis    Assessment: 74 y.o. male presenting with past medical history of HTN, schizophrenia, peripheral vascular disease, and stroke with R sided hemiparesis. Patient was recently admitted on 1/5 due to a fall  that resulted in a right femur fracture that underwent surgical intervention. Prior to admission, patient was on ppx enoxaparin 30 mg SQ daily. Patient has developed a confirmed right lower extremity DVT. Patient now requires full dose anticoagulation with new DVT.   On admission, Hbg was low at 8.3 and platelets were elevated at 559. Scr elevated at 2.74. No concern for bleeding.    Goal of Therapy:  Monitor platelets by anticoagulation protocol: Yes   Plan:  Apixaban 10 mg PO BID x7 days; then start apixaban 5 mg PO BID  Monitor for falls while on apixaban and s/sx of bleeding  Acey Lav, PharmD  PGY1 Acute Care Pharmacy Resident 12/27/2019,7:53 PM

## 2019-12-27 NOTE — ED Notes (Signed)
Patient able to urinate in urinal w/o assistance and I&O cath is no longer needed at this time;Urine sample and tube for culture sent to lab-Monique,RN

## 2019-12-27 NOTE — ED Notes (Signed)
Pt set up with urinal.

## 2019-12-28 ENCOUNTER — Emergency Department (HOSPITAL_COMMUNITY): Payer: Medicare PPO

## 2019-12-28 DIAGNOSIS — L8990 Pressure ulcer of unspecified site, unspecified stage: Secondary | ICD-10-CM | POA: Insufficient documentation

## 2019-12-28 DIAGNOSIS — S72141A Displaced intertrochanteric fracture of right femur, initial encounter for closed fracture: Secondary | ICD-10-CM | POA: Diagnosis not present

## 2019-12-28 DIAGNOSIS — L89899 Pressure ulcer of other site, unspecified stage: Secondary | ICD-10-CM | POA: Insufficient documentation

## 2019-12-28 MED ORDER — HYDROCODONE-ACETAMINOPHEN 5-325 MG PO TABS
1.0000 | ORAL_TABLET | Freq: Once | ORAL | Status: AC
  Administered 2019-12-28: 1 via ORAL
  Filled 2019-12-28: qty 1

## 2019-12-28 MED ORDER — HYDROCODONE-ACETAMINOPHEN 5-325 MG PO TABS
1.0000 | ORAL_TABLET | Freq: Once | ORAL | Status: AC
  Administered 2019-12-29: 1 via ORAL
  Filled 2019-12-28: qty 1

## 2019-12-28 NOTE — ED Notes (Signed)
Pt had a BM and tech and RN changed and cleaned up pt

## 2019-12-28 NOTE — ED Notes (Signed)
Breakfast ordered 

## 2019-12-28 NOTE — Evaluation (Signed)
Physical Therapy Evaluation Patient Details Name: Daniel [name] MRN: [id number] DOB: 12/15/1945 Today's Date: 12/28/2019   History of Present Illness  Pt is a 74 y/o male presenting to the ED with increased swelling and pain in RLE. Found to have DVT in RLE and was started on eliquis. Pt had recent R hip and ankle fx and had ORIF for R ankle fx. PMH includes HTN, schizophrenia, PVD, and CVA with R residual weakness.   Clinical Impression  Pt presenting with problem above and deficits below. Pt requiring mod A to perform bed mobility tasks. Was unable to perform OOB activity, as pt did not have R CAM walker boot. Pt reports multiple falls at home and reports his sister is in a WC and cannot help physically. Feel pt would benefit from SNF level therapies at d/c. Will continue to follow acutely to maximize functional mobility independence and safety.     Follow Up Recommendations SNF;Supervision/Assistance - 24 hour    Equipment Recommendations  None recommended by PT    Recommendations for Other Services       Precautions / Restrictions Precautions Precautions: Fall Precaution Comments: Has had multiple falls at home.  Required Braces or Orthoses: Other Brace Other Brace: Cam boot RLE per previous notes.  Restrictions Weight Bearing Restrictions: Yes RLE Weight Bearing: Weight bearing as tolerated Other Position/Activity Restrictions: WBAT per previous notes.       Mobility  Bed Mobility Overal bed mobility: Needs Assistance Bed Mobility: Supine to Sit;Sit to Supine     Supine to sit: Mod assist Sit to supine: Mod assist   General bed mobility comments: Mod A for trunk elevation to come to sitting and LE assist. Required mod A for LE assist for return to supine.   Transfers                 General transfer comment: Unable as pt did not have CAM boot with him.   Ambulation/Gait                Stairs            Wheelchair Mobility    Modified  [place] (Stroke Patients Only)       Balance Overall balance assessment: Needs assistance Sitting-balance support: No upper extremity supported;Feet supported Sitting balance-Leahy Scale: Fair                                       Pertinent Vitals/Pain Pain Assessment: Faces Faces Pain Scale: Hurts even more Pain Location: RLE  Pain Descriptors / Indicators: Grimacing;Guarding Pain Intervention(s): Limited activity within patient's tolerance;Monitored during session;Repositioned    Home Living Family/patient expects to be discharged to:: Private residence Living Arrangements: Other relatives(sister) Available Help at Discharge: Family;Available 24 hours/day Type of Home: House Home Access: Stairs to enter Entrance Stairs-Rails: Can reach both Entrance Stairs-Number of Steps: 3-4 Home Layout: One level Home Equipment: Walker - 2 wheels;Tub bench;Grab bars - tub/shower;Wheelchair - manual Additional Comments: Pt reports sister cannot help because she is in a WC     Prior Function Level of Independence: Needs assistance   Gait / Transfers Assistance Needed: Pt reports he ambulates with RW and sometimes using WC. Pt with significant RLE hemiparesis, therefore unsure of accuracy.   ADL's / Homemaking Assistance Needed: Pt reports he has been sponge bathing.         Hand Dominance  Extremity/Trunk Assessment   Upper Extremity Assessment Upper Extremity Assessment: Generalized weakness    Lower Extremity Assessment Lower Extremity Assessment: RLE deficits/detail RLE Deficits / Details: RLE hemiparesis at baseline. Pt with increased swelling in R ankle and R calf.        Communication   Communication: No difficulties  Cognition Arousal/Alertness: Awake/alert Behavior During Therapy: WFL for tasks assessed/performed Overall Cognitive Status: No family/caregiver present to determine baseline cognitive functioning                                         General Comments      Exercises     Assessment/Plan    PT Assessment Patient needs continued PT services  PT Problem List Decreased strength;Decreased mobility;Decreased safety awareness;Decreased range of motion;Decreased coordination;Decreased activity tolerance;Cardiopulmonary status limiting activity;Decreased balance;Pain       PT Treatment Interventions DME instruction;Gait training;Functional mobility training;Therapeutic activities;Therapeutic exercise;Balance training;Patient/family education    PT Goals (Current goals can be found in the Care Plan section)  Acute Rehab PT Goals Patient Stated Goal: to decrease pain  PT Goal Formulation: With patient Time For Goal Achievement: 01/11/20 Potential to Achieve Goals: Fair    Frequency Min 2X/week   Barriers to discharge Decreased caregiver support      Co-evaluation               AM-PAC PT "6 Clicks" Mobility  Outcome Measure Help needed turning from your back to your side while in a flat bed without using bedrails?: A Lot Help needed moving from lying on your back to sitting on the side of a flat bed without using bedrails?: A Lot Help needed moving to and from a bed to a chair (including a wheelchair)?: A Lot Help needed standing up from a chair using your arms (e.g., wheelchair or bedside chair)?: A Lot Help needed to walk in hospital room?: A Lot Help needed climbing 3-5 steps with a railing? : Total 6 Click Score: 11    End of Session Equipment Utilized During Treatment: Gait belt Activity Tolerance: Patient tolerated treatment well Patient left: in bed;with call bell/phone within reach Nurse Communication: Mobility status PT Visit Diagnosis: History of falling (Z91.81);Repeated falls (R29.6);Muscle weakness (generalized) (M62.81);Unsteadiness on feet (R26.81);Pain;Hemiplegia and hemiparesis Hemiplegia - Right/Left: Right Hemiplegia - caused by: Unspecified Pain - Right/Left:  Right Pain - part of body: Leg    Time: 1120-1135 PT Time Calculation (min) (ACUTE ONLY): 15 min   Charges:   PT Evaluation $PT Eval Moderate Complexity: 1 Mod          Reuel Derby, PT, DPT  Acute Rehabilitation Services  Pager: [phone] Office: [phone]   Rudean Hitt 12/28/2019, 12:13 PM

## 2019-12-28 NOTE — TOC Initial Note (Signed)
Transition of Care [facility]) - Initial/Assessment Note    Patient Details  Name: Daniel [name] MRN: [id number] Date of Birth: 02-07-1946  [facility]) CM/SW Contact:    Daniel Living, LCSW Phone Number: 12/28/2019, 8:03 PM  Clinical Narrative:  CSW met with Pt at bedside for assessment. Pt was polite and calm.  Chart states that Pt had previously refused SNF, CSW and Pt were able to discuss the need for rehabilitation services to regain strength. CSW restated that choice was Pts to decide. Pt decided that SNF would be the right choice.  CSW spoke with Pts sister via phone, sister stated her support for SNF placement.              Expected Discharge Plan: Skilled Nursing Facility Barriers to Discharge: Continued Medical Work up   Patient Goals and CMS Choice Patient states their goals for this hospitalization and ongoing recovery are:: Get stronger so I can go home after rehab CMS Medicare.gov Compare Post Acute Care list provided to:: Patient Choice offered to / list presented to : Patient  Expected Discharge Plan and Services Expected Discharge Plan: [place] Choice: [place] [name] arrangements for the past 2 months: Single Family Home                                      Prior [name] Arrangements/Services [name] arrangements for the past 2 months: Single Family Home Lives with:: Siblings Patient language and need for interpreter reviewed:: No Do you feel safe going back to the place where you live?: Yes            Criminal Activity/Legal Involvement Pertinent to Current Situation/Hospitalization: No - Comment as needed  Activities of Daily [name]      Permission Sought/Granted Permission sought to share information with : Family Supports(Sister Daniel [name] [phone]) Permission granted to share information with : Yes, Verbal Permission Granted  Share Information with NAME: Daniel [name]  [phone]           Emotional Assessment Appearance:: Appears older than stated age Attitude/Demeanor/Rapport: Other (comment)(Polite) Affect (typically observed): Guarded Orientation: : Oriented to Self, Oriented to Place, Oriented to  Time, Oriented to Situation, Fluctuating Orientation (Suspected and/or reported Sundowners) Alcohol / Substance Use: Not Applicable Psych Involvement: No (comment)  Admission diagnosis:  Femur pain, post surg Patient Active Problem List   Diagnosis Date Noted  . Pressure injury of skin 12/28/2019  . Closed displaced intertrochanteric fracture of right femur ([place]) 12/11/2019  . LFT elevation   . AKI (acute kidney injury) ([place])   . Nicotine dependence with current use 03/29/2018  . Poor dentition 01/17/2015  . Peripheral vascular disease ([place]) 01/18/2014  . Benign hypertension with CKD (chronic kidney disease) stage III 01/18/2014  . Hyperlipidemia 08/17/2011  . Hemiplegia, late effect of cerebrovascular disease ([place]) 10/17/2009  . Anemia 10/16/2008  . Substance abuse in remission ([place]) 10/16/2008  . Schizophrenia ([place]) 12/07/1972   PCP:  Daniel Barrack, MD Pharmacy:   [id number] [place] [id number]: [phone]: [phone]    Social Determinants of Health (SDOH) Interventions    Readmission Risk Interventions No flowsheet data found.

## 2019-12-28 NOTE — Discharge Planning (Signed)
EDCM discussed pt with EDP Z[place] regarding inpatient/observation admission (new DVT) vs PT consult for SNF placement from ED.  EDP placed PT evaluation order for disposition recommendations as pt is getting treatment for new DVT.

## 2019-12-28 NOTE — NC FL2 (Addendum)
[place] LEVEL OF CARE SCREENING TOOL     IDENTIFICATION  Patient Name: Daniel [name] Birthdate: 1946/03/13 Sex: male Admission Date (Current Location): 12/27/2019  [facility] and [place] Number:  Herbalist and Address:  [facility], [place] [address], [place], [place] [address]      Provider Number: [id number]  Attending Physician Name and Address:  No att. providers found  Relative Name and Phone Number:  [name] [phone]    Current Level of Care: Hospital Recommended Level of Care: [place] Prior Approval Number:    Date Approved/Denied:   PASRR Number: [id number]  Discharge Plan: SNF    Current Diagnoses: Patient Active Problem List   Diagnosis Date Noted  . Closed displaced intertrochanteric fracture of right femur ([place]) 12/11/2019  . LFT elevation   . AKI (acute kidney injury) ([place])   . Nicotine dependence with current use 03/29/2018  . Poor dentition 01/17/2015  . Peripheral vascular disease ([place]) 01/18/2014  . Benign hypertension with CKD (chronic kidney disease) stage III 01/18/2014  . Hyperlipidemia 08/17/2011  . Hemiplegia, late effect of cerebrovascular disease ([place]) 10/17/2009  . Anemia 10/16/2008  . Substance abuse in remission ([place]) 10/16/2008  . Schizophrenia ([place]) 12/07/1972    Orientation RESPIRATION BLADDER Height & Weight     Self, Time, Situation, Place  Normal Incontinent Weight:   Height:     BEHAVIORAL SYMPTOMS/MOOD NEUROLOGICAL BOWEL NUTRITION STATUS      Continent Diet(Regular)  AMBULATORY STATUS COMMUNICATION OF NEEDS Skin   Extensive Assist Verbally Other (Comment)(Pressure injury buttocks posterior. Stage 2)                       Personal Care Assistance Level of Assistance  Bathing, Feeding, Dressing Bathing Assistance: Maximum assistance Feeding assistance: Limited assistance Dressing Assistance: Maximum assistance     Functional Limitations Info   Sight, Hearing, Speech Sight Info: Adequate Hearing Info: Adequate Speech Info: Adequate    SPECIAL CARE FACTORS FREQUENCY  PT (By licensed PT), OT (By licensed OT)     PT Frequency: 5x weekly OT Frequency: 5x weekly            Contractures Contractures Info: Not present    Additional Factors Info  Code Status, Allergies Code Status Info: Full Allergies Info: NKDA           Current Medications (12/28/2019):  This is the current hospital active medication list Current Facility-Administered Medications  Medication Dose Route Frequency Provider Last Rate Last Admin  . amLODipine (NORVASC) tablet 10 mg  10 mg Oral Daily Harris, Abigail, PA-C   10 mg at 12/28/19 1053  . apixaban (ELIQUIS) tablet 10 mg  10 mg Oral BID Gillian Scarce, RPH   10 mg at 12/27/19 2158   Followed by  . [START ON 01/03/2020] apixaban (ELIQUIS) tablet 5 mg  5 mg Oral BID Gillian Scarce, [facility]      . atorvastatin (LIPITOR) tablet 40 mg  40 mg Oral Daily Harris, Abigail, PA-C   40 mg at 12/28/19 1056  . clopidogrel (PLAVIX) tablet 75 mg  75 mg Oral Daily Harris, Vernie Shanks, PA-C   75 mg at 12/28/19 1054  . folic acid (FOLVITE) tablet 1 mg  1 mg Oral Daily Harris, Abigail, PA-C   1 mg at 12/28/19 1057  . hydrALAZINE (APRESOLINE) tablet 50 mg  50 mg Oral Q8H Harris, Abigail, PA-C   50 mg at 12/28/19 [id number]  .  lisinopril (ZESTRIL) tablet 20 mg  20 mg Oral Daily Harris, Abigail, PA-C   20 mg at 12/28/19 1056  . metoprolol tartrate (LOPRESSOR) tablet 100 mg  100 mg Oral BID Margarita Mail, PA-C   100 mg at 12/28/19 1054   Current Outpatient Medications  Medication Sig Dispense Refill  . amLODipine (NORVASC) 10 MG tablet Take 1 tablet (10 mg total) by mouth daily. 30 tablet 0  . aspirin EC 325 MG tablet Take 325-650 mg by mouth as needed (for headaches).     [name] atorvastatin (LIPITOR) 40 MG tablet Take 1 tablet (40 mg total) by mouth daily. 90 tablet 3  . clopidogrel (PLAVIX) 75 MG tablet Take 1 tablet (75 mg total)  by mouth daily. 90 tablet 3  . folic acid (FOLVITE) 1 MG tablet Take 1 tablet (1 mg total) by mouth daily. 90 tablet 3  . gabapentin (NEURONTIN) 100 MG capsule TAKE 1 CAPSULE BY MOUTH THREE TIMES A DAY (Patient taking differently: Take 100 mg by mouth 3 (three) times daily. ) 270 capsule 3  . hydrALAZINE (APRESOLINE) 50 MG tablet Take 1 tablet (50 mg total) by mouth every 8 (eight) hours. 90 tablet 0  . lisinopril (ZESTRIL) 20 MG tablet Take 1 tablet (20 mg total) by mouth daily. 90 tablet 3  . metoprolol tartrate (LOPRESSOR) 100 MG tablet Take 1 tablet (100 mg total) by mouth 2 (two) times daily. 180 tablet 3  . polyethylene glycol (MIRALAX / GLYCOLAX) 17 g packet Take 17 g by mouth daily as needed for mild constipation. 14 each 0  . senna-docusate (SENOKOT-S) 8.6-50 MG tablet Take 1 tablet by mouth 2 (two) times daily. (Patient taking differently: Take 1 tablet by mouth 2 (two) times daily as needed for mild constipation or moderate constipation. ) 20 tablet 0  . enoxaparin (LOVENOX) 30 MG/0.3ML injection Inject 0.3 mLs (30 mg total) into the skin daily. (Patient not taking: Reported on 12/28/2019) 9 mL 0  . HYDROcodone-acetaminophen (NORCO) 5-325 MG tablet Take 1 tablet by mouth every 4 (four) hours as needed for severe pain. (Patient not taking: Reported on 12/28/2019) 30 tablet 0  . Multiple Vitamins-Minerals (MULTIVITAMIN WITH MINERALS) tablet Take 1 tablet by mouth daily. (Patient not taking: Reported on 12/28/2019) 30 tablet 11     Discharge Medications: Please see discharge summary for a list of discharge medications.  Relevant Imaging Results:  Relevant Lab Results:   Additional Information SSN: [id number]  Vergie Living, LCSW

## 2019-12-28 NOTE — ED Notes (Signed)
Pt's right foot is internally rotated, asked pt if it had turned more or hurts more to move. Dr. Reather Converse made aware-- xrays ordered

## 2019-12-28 NOTE — Social Work (Signed)
With Pts permission, EDCSW spoke with sister Kalman Shan via phone at [phone] and discussed plan to transition Pt to SNF care and the logistics of placement and bed availability.  Sister asked to be updated once plan was in place.

## 2019-12-28 NOTE — ED Notes (Signed)
Pt assisted with breakfast-  NT cut up pancakes, and drinks- pt able to feed self

## 2019-12-29 LAB — RESPIRATORY PANEL BY RT PCR (FLU A&B, COVID)
Influenza A by PCR: NEGATIVE
Influenza B by PCR: NEGATIVE
SARS Coronavirus 2 by RT PCR: NEGATIVE

## 2019-12-29 MED ORDER — HYDROCODONE-ACETAMINOPHEN 5-325 MG PO TABS
1.0000 | ORAL_TABLET | Freq: Once | ORAL | Status: AC
  Administered 2019-12-29: 1 via ORAL
  Filled 2019-12-29: qty 1

## 2019-12-29 NOTE — Progress Notes (Signed)
CSW contacted [facility] to begin auth for SNF at [facility]. Reference number is [id number]. CSW to fax over needed information for decision to be made.   CSW also contacted EDP as patient needs a COVID test.   Golden Circle, LCSW Transitions of [place] ED [phone]

## 2019-12-29 NOTE — Progress Notes (Signed)
[employer] Authorization # [id number] Approved starting Monday 1/25 for 3 days [facility] Case manager Ethelene Browns fax # [phone]

## 2019-12-29 NOTE — Progress Notes (Signed)
Patient chose [facility] for SNF, however, they can not accept him until Monday morning.   Patient also has been offers to [facility]. CSW attempted to reach out to them with current bed availability, but was unable to reach anyone and left messages for call back. Patient also has bed offer to [facility], but will not have beds until Wednesday.   Golden Circle, LCSW Transitions of Care Department [facility] ED [phone]

## 2019-12-29 NOTE — Discharge Instructions (Signed)

## 2019-12-29 NOTE — Discharge Planning (Signed)
EDCM met with pt at bedside to discuss bed offers for [facility] placement.  Pt chose [facility].  [facility] can not accept him until Monday.

## 2019-12-30 ENCOUNTER — Other Ambulatory Visit: Payer: Self-pay

## 2019-12-30 MED ORDER — HYDROCODONE-ACETAMINOPHEN 5-325 MG PO TABS
1.0000 | ORAL_TABLET | ORAL | Status: DC | PRN
  Administered 2019-12-30 – 2020-01-01 (×6): 1 via ORAL
  Filled 2019-12-30 (×6): qty 1

## 2019-12-30 NOTE — ED Notes (Signed)
Patient was given a Sprite and [employer]. A Regular Diet was ordered for Lunch.

## 2019-12-30 NOTE — ED Notes (Signed)
Pt c/o right leg pain - assisted pt w/repositioning. Message sent to Dr Alvino Chapel.

## 2019-12-30 NOTE — ED Notes (Signed)
Pt has pressed call bell x 2 asking for assistance w/repositioning self in bed. Pt was able to reposition himself w/encouragement.

## 2019-12-31 NOTE — ED Notes (Signed)
This tech changed pt's brief.

## 2019-12-31 NOTE — ED Notes (Addendum)
Breakfast tray delievered. Pt sitting up, eating breakfast

## 2019-12-31 NOTE — ED Notes (Signed)
Patient denies pain and is resting comfortably.  

## 2020-01-01 DIAGNOSIS — I82411 Acute embolism and thrombosis of right femoral vein: Secondary | ICD-10-CM | POA: Diagnosis not present

## 2020-01-01 DIAGNOSIS — N189 Chronic kidney disease, unspecified: Secondary | ICD-10-CM | POA: Diagnosis not present

## 2020-01-01 DIAGNOSIS — M6281 Muscle weakness (generalized): Secondary | ICD-10-CM | POA: Diagnosis not present

## 2020-01-01 DIAGNOSIS — I69351 Hemiplegia and hemiparesis following cerebral infarction affecting right dominant side: Secondary | ICD-10-CM | POA: Diagnosis not present

## 2020-01-01 DIAGNOSIS — F2089 Other schizophrenia: Secondary | ICD-10-CM | POA: Diagnosis not present

## 2020-01-01 DIAGNOSIS — N183 Chronic kidney disease, stage 3 unspecified: Secondary | ICD-10-CM | POA: Diagnosis not present

## 2020-01-01 DIAGNOSIS — R279 Unspecified lack of coordination: Secondary | ICD-10-CM | POA: Diagnosis not present

## 2020-01-01 DIAGNOSIS — Z96641 Presence of right artificial hip joint: Secondary | ICD-10-CM | POA: Diagnosis not present

## 2020-01-01 DIAGNOSIS — R531 Weakness: Secondary | ICD-10-CM | POA: Diagnosis not present

## 2020-01-01 DIAGNOSIS — I129 Hypertensive chronic kidney disease with stage 1 through stage 4 chronic kidney disease, or unspecified chronic kidney disease: Secondary | ICD-10-CM | POA: Diagnosis not present

## 2020-01-01 DIAGNOSIS — I69898 Other sequelae of other cerebrovascular disease: Secondary | ICD-10-CM | POA: Diagnosis not present

## 2020-01-01 DIAGNOSIS — I82501 Chronic embolism and thrombosis of unspecified deep veins of right lower extremity: Secondary | ICD-10-CM | POA: Diagnosis not present

## 2020-01-01 DIAGNOSIS — S72141A Displaced intertrochanteric fracture of right femur, initial encounter for closed fracture: Secondary | ICD-10-CM | POA: Diagnosis not present

## 2020-01-01 DIAGNOSIS — I10 Essential (primary) hypertension: Secondary | ICD-10-CM | POA: Diagnosis not present

## 2020-01-01 DIAGNOSIS — Z4789 Encounter for other orthopedic aftercare: Secondary | ICD-10-CM | POA: Diagnosis not present

## 2020-01-01 DIAGNOSIS — E785 Hyperlipidemia, unspecified: Secondary | ICD-10-CM | POA: Diagnosis not present

## 2020-01-01 DIAGNOSIS — Z743 Need for continuous supervision: Secondary | ICD-10-CM | POA: Diagnosis not present

## 2020-01-01 LAB — POC SARS CORONAVIRUS 2 AG
SARS Coronavirus 2 Ag: NEGATIVE
SARS Coronavirus 2 Ag: NEGATIVE

## 2020-01-01 NOTE — ED Notes (Signed)
Dinner Tray Ordered @ 1709. 

## 2020-01-01 NOTE — Progress Notes (Addendum)
2:15pm: Patient will go to room 9B. The number to call for report is [phone]. CSW arranged for transportation via [facility] with a 3pm pick up time.  12pm: CSW spoke with patient's sister [name] at [phone] to inform her of discharge plan, she is agreeable.  CSW notified Claiborne Billings at [facility] of patient's negative COVID results.  CSW waiting for final details about the bed assignment at the facility.  Madilyn Fireman, MSW, LCSW-A Transitions of Care  Clinical Social Worker  [facility] Emergency Departments  Medical ICU [phone]

## 2020-01-01 NOTE — ED Notes (Signed)
[place] - 01/01/2020   Breakfast ordered

## 2020-01-01 NOTE — ED Notes (Signed)
Report given to English as a second language teacher at [facility]. Waiting for [facility] to transport

## 2020-01-01 NOTE — ED Notes (Signed)
Patient was given Catheryn Bacon and Sprite. Diet was ordered for Dinner.

## 2020-01-01 NOTE — ED Notes (Signed)
Lunch Tray Ordered @ 1008. 

## 2020-01-02 DIAGNOSIS — E785 Hyperlipidemia, unspecified: Secondary | ICD-10-CM | POA: Diagnosis not present

## 2020-01-02 DIAGNOSIS — Z4789 Encounter for other orthopedic aftercare: Secondary | ICD-10-CM | POA: Diagnosis not present

## 2020-01-02 DIAGNOSIS — I129 Hypertensive chronic kidney disease with stage 1 through stage 4 chronic kidney disease, or unspecified chronic kidney disease: Secondary | ICD-10-CM | POA: Diagnosis not present

## 2020-01-02 DIAGNOSIS — N183 Chronic kidney disease, stage 3 unspecified: Secondary | ICD-10-CM | POA: Diagnosis not present

## 2020-01-09 DIAGNOSIS — I69898 Other sequelae of other cerebrovascular disease: Secondary | ICD-10-CM | POA: Diagnosis not present

## 2020-01-09 DIAGNOSIS — N183 Chronic kidney disease, stage 3 unspecified: Secondary | ICD-10-CM | POA: Diagnosis not present

## 2020-01-09 DIAGNOSIS — I82411 Acute embolism and thrombosis of right femoral vein: Secondary | ICD-10-CM | POA: Diagnosis not present

## 2020-01-09 DIAGNOSIS — I69351 Hemiplegia and hemiparesis following cerebral infarction affecting right dominant side: Secondary | ICD-10-CM | POA: Diagnosis not present

## 2020-01-24 DIAGNOSIS — I129 Hypertensive chronic kidney disease with stage 1 through stage 4 chronic kidney disease, or unspecified chronic kidney disease: Secondary | ICD-10-CM | POA: Diagnosis not present

## 2020-01-24 DIAGNOSIS — N183 Chronic kidney disease, stage 3 unspecified: Secondary | ICD-10-CM | POA: Diagnosis not present

## 2020-01-24 DIAGNOSIS — N189 Chronic kidney disease, unspecified: Secondary | ICD-10-CM | POA: Diagnosis not present

## 2020-01-24 DIAGNOSIS — Z96641 Presence of right artificial hip joint: Secondary | ICD-10-CM | POA: Diagnosis not present

## 2020-01-25 ENCOUNTER — Telehealth: Payer: Self-pay | Admitting: Family Medicine

## 2020-01-25 NOTE — Telephone Encounter (Signed)
I called the pt to schedule AWV with [facility], but was told that he's still in the facility right now.

## 2020-01-28 DIAGNOSIS — L89612 Pressure ulcer of right heel, stage 2: Secondary | ICD-10-CM | POA: Diagnosis not present

## 2020-01-28 DIAGNOSIS — N183 Chronic kidney disease, stage 3 unspecified: Secondary | ICD-10-CM | POA: Diagnosis not present

## 2020-01-28 DIAGNOSIS — L89322 Pressure ulcer of left buttock, stage 2: Secondary | ICD-10-CM | POA: Diagnosis not present

## 2020-01-28 DIAGNOSIS — I69351 Hemiplegia and hemiparesis following cerebral infarction affecting right dominant side: Secondary | ICD-10-CM | POA: Diagnosis not present

## 2020-01-28 DIAGNOSIS — S72001D Fracture of unspecified part of neck of right femur, subsequent encounter for closed fracture with routine healing: Secondary | ICD-10-CM | POA: Diagnosis not present

## 2020-01-28 DIAGNOSIS — I129 Hypertensive chronic kidney disease with stage 1 through stage 4 chronic kidney disease, or unspecified chronic kidney disease: Secondary | ICD-10-CM | POA: Diagnosis not present

## 2020-01-28 DIAGNOSIS — I82511 Chronic embolism and thrombosis of right femoral vein: Secondary | ICD-10-CM | POA: Diagnosis not present

## 2020-01-28 DIAGNOSIS — S82891D Other fracture of right lower leg, subsequent encounter for closed fracture with routine healing: Secondary | ICD-10-CM | POA: Diagnosis not present

## 2020-01-28 DIAGNOSIS — L89522 Pressure ulcer of left ankle, stage 2: Secondary | ICD-10-CM | POA: Diagnosis not present

## 2020-01-30 DIAGNOSIS — I129 Hypertensive chronic kidney disease with stage 1 through stage 4 chronic kidney disease, or unspecified chronic kidney disease: Secondary | ICD-10-CM | POA: Diagnosis not present

## 2020-01-30 DIAGNOSIS — S82891D Other fracture of right lower leg, subsequent encounter for closed fracture with routine healing: Secondary | ICD-10-CM | POA: Diagnosis not present

## 2020-01-30 DIAGNOSIS — L89322 Pressure ulcer of left buttock, stage 2: Secondary | ICD-10-CM | POA: Diagnosis not present

## 2020-01-30 DIAGNOSIS — I69351 Hemiplegia and hemiparesis following cerebral infarction affecting right dominant side: Secondary | ICD-10-CM | POA: Diagnosis not present

## 2020-01-30 DIAGNOSIS — S72001D Fracture of unspecified part of neck of right femur, subsequent encounter for closed fracture with routine healing: Secondary | ICD-10-CM | POA: Diagnosis not present

## 2020-01-30 DIAGNOSIS — N183 Chronic kidney disease, stage 3 unspecified: Secondary | ICD-10-CM | POA: Diagnosis not present

## 2020-01-30 DIAGNOSIS — I82511 Chronic embolism and thrombosis of right femoral vein: Secondary | ICD-10-CM | POA: Diagnosis not present

## 2020-01-30 DIAGNOSIS — L89522 Pressure ulcer of left ankle, stage 2: Secondary | ICD-10-CM | POA: Diagnosis not present

## 2020-01-30 DIAGNOSIS — L89612 Pressure ulcer of right heel, stage 2: Secondary | ICD-10-CM | POA: Diagnosis not present

## 2020-01-31 DIAGNOSIS — S72001D Fracture of unspecified part of neck of right femur, subsequent encounter for closed fracture with routine healing: Secondary | ICD-10-CM | POA: Diagnosis not present

## 2020-01-31 DIAGNOSIS — L89612 Pressure ulcer of right heel, stage 2: Secondary | ICD-10-CM | POA: Diagnosis not present

## 2020-01-31 DIAGNOSIS — S82891D Other fracture of right lower leg, subsequent encounter for closed fracture with routine healing: Secondary | ICD-10-CM | POA: Diagnosis not present

## 2020-01-31 DIAGNOSIS — L89522 Pressure ulcer of left ankle, stage 2: Secondary | ICD-10-CM | POA: Diagnosis not present

## 2020-01-31 DIAGNOSIS — I82511 Chronic embolism and thrombosis of right femoral vein: Secondary | ICD-10-CM | POA: Diagnosis not present

## 2020-01-31 DIAGNOSIS — L89322 Pressure ulcer of left buttock, stage 2: Secondary | ICD-10-CM | POA: Diagnosis not present

## 2020-01-31 DIAGNOSIS — I69351 Hemiplegia and hemiparesis following cerebral infarction affecting right dominant side: Secondary | ICD-10-CM | POA: Diagnosis not present

## 2020-01-31 DIAGNOSIS — I129 Hypertensive chronic kidney disease with stage 1 through stage 4 chronic kidney disease, or unspecified chronic kidney disease: Secondary | ICD-10-CM | POA: Diagnosis not present

## 2020-01-31 DIAGNOSIS — N183 Chronic kidney disease, stage 3 unspecified: Secondary | ICD-10-CM | POA: Diagnosis not present

## 2020-02-01 DIAGNOSIS — I69351 Hemiplegia and hemiparesis following cerebral infarction affecting right dominant side: Secondary | ICD-10-CM | POA: Diagnosis not present

## 2020-02-01 DIAGNOSIS — L89322 Pressure ulcer of left buttock, stage 2: Secondary | ICD-10-CM | POA: Diagnosis not present

## 2020-02-01 DIAGNOSIS — N183 Chronic kidney disease, stage 3 unspecified: Secondary | ICD-10-CM | POA: Diagnosis not present

## 2020-02-01 DIAGNOSIS — I82511 Chronic embolism and thrombosis of right femoral vein: Secondary | ICD-10-CM | POA: Diagnosis not present

## 2020-02-01 DIAGNOSIS — S82891D Other fracture of right lower leg, subsequent encounter for closed fracture with routine healing: Secondary | ICD-10-CM | POA: Diagnosis not present

## 2020-02-01 DIAGNOSIS — L89612 Pressure ulcer of right heel, stage 2: Secondary | ICD-10-CM | POA: Diagnosis not present

## 2020-02-01 DIAGNOSIS — L89522 Pressure ulcer of left ankle, stage 2: Secondary | ICD-10-CM | POA: Diagnosis not present

## 2020-02-01 DIAGNOSIS — I129 Hypertensive chronic kidney disease with stage 1 through stage 4 chronic kidney disease, or unspecified chronic kidney disease: Secondary | ICD-10-CM | POA: Diagnosis not present

## 2020-02-01 DIAGNOSIS — S72001D Fracture of unspecified part of neck of right femur, subsequent encounter for closed fracture with routine healing: Secondary | ICD-10-CM | POA: Diagnosis not present

## 2020-02-02 ENCOUNTER — Telehealth: Payer: Self-pay

## 2020-02-02 NOTE — Telephone Encounter (Signed)
Attempted to call Daniel [name] not able to leave message due to voice mail full

## 2020-02-02 NOTE — Telephone Encounter (Signed)
Olin Hauser calling from [facility] regarding wound on right heel, foot and left ankle wound. Please return Olin Hauser call at [phone]

## 2020-02-02 NOTE — Telephone Encounter (Signed)
[place] for verbal order  for wound care x2 weekly

## 2020-02-05 DIAGNOSIS — L89322 Pressure ulcer of left buttock, stage 2: Secondary | ICD-10-CM | POA: Diagnosis not present

## 2020-02-05 DIAGNOSIS — N183 Chronic kidney disease, stage 3 unspecified: Secondary | ICD-10-CM | POA: Diagnosis not present

## 2020-02-05 DIAGNOSIS — L89612 Pressure ulcer of right heel, stage 2: Secondary | ICD-10-CM | POA: Diagnosis not present

## 2020-02-05 DIAGNOSIS — S72001D Fracture of unspecified part of neck of right femur, subsequent encounter for closed fracture with routine healing: Secondary | ICD-10-CM | POA: Diagnosis not present

## 2020-02-05 DIAGNOSIS — I129 Hypertensive chronic kidney disease with stage 1 through stage 4 chronic kidney disease, or unspecified chronic kidney disease: Secondary | ICD-10-CM | POA: Diagnosis not present

## 2020-02-05 DIAGNOSIS — S82891D Other fracture of right lower leg, subsequent encounter for closed fracture with routine healing: Secondary | ICD-10-CM | POA: Diagnosis not present

## 2020-02-05 DIAGNOSIS — L89522 Pressure ulcer of left ankle, stage 2: Secondary | ICD-10-CM | POA: Diagnosis not present

## 2020-02-05 DIAGNOSIS — I82511 Chronic embolism and thrombosis of right femoral vein: Secondary | ICD-10-CM | POA: Diagnosis not present

## 2020-02-05 DIAGNOSIS — I69351 Hemiplegia and hemiparesis following cerebral infarction affecting right dominant side: Secondary | ICD-10-CM | POA: Diagnosis not present

## 2020-02-06 ENCOUNTER — Telehealth: Payer: Self-pay | Admitting: Family Medicine

## 2020-02-06 NOTE — Telephone Encounter (Signed)
Please advise 

## 2020-02-06 NOTE — Telephone Encounter (Signed)
Please call and get more information. If he has an infected wound he needs to be seen ASAP.   [name]. [name], MD 02/06/2020 2:26 PM

## 2020-02-06 NOTE — Telephone Encounter (Signed)
Olin Hauser is calling in from [facility] and was wanting to get orders on the patient for his R Heal, there is a bad odor to it and would also like to have an antibiotic sent to his pharmacy.

## 2020-02-06 NOTE — Telephone Encounter (Signed)
Left voice message for patient to call clinic.  

## 2020-02-07 DIAGNOSIS — N183 Chronic kidney disease, stage 3 unspecified: Secondary | ICD-10-CM | POA: Diagnosis not present

## 2020-02-07 DIAGNOSIS — I69351 Hemiplegia and hemiparesis following cerebral infarction affecting right dominant side: Secondary | ICD-10-CM | POA: Diagnosis not present

## 2020-02-07 DIAGNOSIS — L89322 Pressure ulcer of left buttock, stage 2: Secondary | ICD-10-CM | POA: Diagnosis not present

## 2020-02-07 DIAGNOSIS — I129 Hypertensive chronic kidney disease with stage 1 through stage 4 chronic kidney disease, or unspecified chronic kidney disease: Secondary | ICD-10-CM | POA: Diagnosis not present

## 2020-02-07 DIAGNOSIS — S72001D Fracture of unspecified part of neck of right femur, subsequent encounter for closed fracture with routine healing: Secondary | ICD-10-CM | POA: Diagnosis not present

## 2020-02-07 DIAGNOSIS — L89612 Pressure ulcer of right heel, stage 2: Secondary | ICD-10-CM | POA: Diagnosis not present

## 2020-02-07 DIAGNOSIS — L89522 Pressure ulcer of left ankle, stage 2: Secondary | ICD-10-CM | POA: Diagnosis not present

## 2020-02-07 DIAGNOSIS — I82511 Chronic embolism and thrombosis of right femoral vein: Secondary | ICD-10-CM | POA: Diagnosis not present

## 2020-02-07 DIAGNOSIS — S82891D Other fracture of right lower leg, subsequent encounter for closed fracture with routine healing: Secondary | ICD-10-CM | POA: Diagnosis not present

## 2020-02-08 DIAGNOSIS — L89612 Pressure ulcer of right heel, stage 2: Secondary | ICD-10-CM | POA: Diagnosis not present

## 2020-02-08 DIAGNOSIS — I129 Hypertensive chronic kidney disease with stage 1 through stage 4 chronic kidney disease, or unspecified chronic kidney disease: Secondary | ICD-10-CM | POA: Diagnosis not present

## 2020-02-08 DIAGNOSIS — L89322 Pressure ulcer of left buttock, stage 2: Secondary | ICD-10-CM | POA: Diagnosis not present

## 2020-02-08 DIAGNOSIS — L89522 Pressure ulcer of left ankle, stage 2: Secondary | ICD-10-CM | POA: Diagnosis not present

## 2020-02-08 DIAGNOSIS — I69351 Hemiplegia and hemiparesis following cerebral infarction affecting right dominant side: Secondary | ICD-10-CM | POA: Diagnosis not present

## 2020-02-08 DIAGNOSIS — I82511 Chronic embolism and thrombosis of right femoral vein: Secondary | ICD-10-CM | POA: Diagnosis not present

## 2020-02-08 DIAGNOSIS — S72001D Fracture of unspecified part of neck of right femur, subsequent encounter for closed fracture with routine healing: Secondary | ICD-10-CM | POA: Diagnosis not present

## 2020-02-08 DIAGNOSIS — N183 Chronic kidney disease, stage 3 unspecified: Secondary | ICD-10-CM | POA: Diagnosis not present

## 2020-02-08 DIAGNOSIS — S82891D Other fracture of right lower leg, subsequent encounter for closed fracture with routine healing: Secondary | ICD-10-CM | POA: Diagnosis not present

## 2020-02-12 DIAGNOSIS — I82511 Chronic embolism and thrombosis of right femoral vein: Secondary | ICD-10-CM | POA: Diagnosis not present

## 2020-02-12 DIAGNOSIS — N183 Chronic kidney disease, stage 3 unspecified: Secondary | ICD-10-CM | POA: Diagnosis not present

## 2020-02-12 DIAGNOSIS — L89612 Pressure ulcer of right heel, stage 2: Secondary | ICD-10-CM | POA: Diagnosis not present

## 2020-02-12 DIAGNOSIS — L89522 Pressure ulcer of left ankle, stage 2: Secondary | ICD-10-CM | POA: Diagnosis not present

## 2020-02-12 DIAGNOSIS — S82891D Other fracture of right lower leg, subsequent encounter for closed fracture with routine healing: Secondary | ICD-10-CM | POA: Diagnosis not present

## 2020-02-12 DIAGNOSIS — S72001D Fracture of unspecified part of neck of right femur, subsequent encounter for closed fracture with routine healing: Secondary | ICD-10-CM | POA: Diagnosis not present

## 2020-02-12 DIAGNOSIS — I129 Hypertensive chronic kidney disease with stage 1 through stage 4 chronic kidney disease, or unspecified chronic kidney disease: Secondary | ICD-10-CM | POA: Diagnosis not present

## 2020-02-12 DIAGNOSIS — L89322 Pressure ulcer of left buttock, stage 2: Secondary | ICD-10-CM | POA: Diagnosis not present

## 2020-02-12 DIAGNOSIS — I69351 Hemiplegia and hemiparesis following cerebral infarction affecting right dominant side: Secondary | ICD-10-CM | POA: Diagnosis not present

## 2020-02-13 DIAGNOSIS — I69351 Hemiplegia and hemiparesis following cerebral infarction affecting right dominant side: Secondary | ICD-10-CM | POA: Diagnosis not present

## 2020-02-13 DIAGNOSIS — I129 Hypertensive chronic kidney disease with stage 1 through stage 4 chronic kidney disease, or unspecified chronic kidney disease: Secondary | ICD-10-CM | POA: Diagnosis not present

## 2020-02-13 DIAGNOSIS — L89612 Pressure ulcer of right heel, stage 2: Secondary | ICD-10-CM | POA: Diagnosis not present

## 2020-02-13 DIAGNOSIS — S72001D Fracture of unspecified part of neck of right femur, subsequent encounter for closed fracture with routine healing: Secondary | ICD-10-CM | POA: Diagnosis not present

## 2020-02-13 DIAGNOSIS — L89522 Pressure ulcer of left ankle, stage 2: Secondary | ICD-10-CM | POA: Diagnosis not present

## 2020-02-13 DIAGNOSIS — I82511 Chronic embolism and thrombosis of right femoral vein: Secondary | ICD-10-CM | POA: Diagnosis not present

## 2020-02-13 DIAGNOSIS — N183 Chronic kidney disease, stage 3 unspecified: Secondary | ICD-10-CM | POA: Diagnosis not present

## 2020-02-13 DIAGNOSIS — S82891D Other fracture of right lower leg, subsequent encounter for closed fracture with routine healing: Secondary | ICD-10-CM | POA: Diagnosis not present

## 2020-02-13 DIAGNOSIS — L89322 Pressure ulcer of left buttock, stage 2: Secondary | ICD-10-CM | POA: Diagnosis not present

## 2020-02-14 DIAGNOSIS — I82511 Chronic embolism and thrombosis of right femoral vein: Secondary | ICD-10-CM | POA: Diagnosis not present

## 2020-02-14 DIAGNOSIS — L89522 Pressure ulcer of left ankle, stage 2: Secondary | ICD-10-CM | POA: Diagnosis not present

## 2020-02-14 DIAGNOSIS — L89612 Pressure ulcer of right heel, stage 2: Secondary | ICD-10-CM | POA: Diagnosis not present

## 2020-02-14 DIAGNOSIS — I69351 Hemiplegia and hemiparesis following cerebral infarction affecting right dominant side: Secondary | ICD-10-CM | POA: Diagnosis not present

## 2020-02-14 DIAGNOSIS — S82891D Other fracture of right lower leg, subsequent encounter for closed fracture with routine healing: Secondary | ICD-10-CM | POA: Diagnosis not present

## 2020-02-14 DIAGNOSIS — I129 Hypertensive chronic kidney disease with stage 1 through stage 4 chronic kidney disease, or unspecified chronic kidney disease: Secondary | ICD-10-CM | POA: Diagnosis not present

## 2020-02-14 DIAGNOSIS — S72001D Fracture of unspecified part of neck of right femur, subsequent encounter for closed fracture with routine healing: Secondary | ICD-10-CM | POA: Diagnosis not present

## 2020-02-14 DIAGNOSIS — N183 Chronic kidney disease, stage 3 unspecified: Secondary | ICD-10-CM | POA: Diagnosis not present

## 2020-02-14 DIAGNOSIS — L89322 Pressure ulcer of left buttock, stage 2: Secondary | ICD-10-CM | POA: Diagnosis not present

## 2020-02-15 DIAGNOSIS — S72141A Displaced intertrochanteric fracture of right femur, initial encounter for closed fracture: Secondary | ICD-10-CM | POA: Diagnosis not present

## 2020-02-15 DIAGNOSIS — N183 Chronic kidney disease, stage 3 unspecified: Secondary | ICD-10-CM | POA: Diagnosis not present

## 2020-02-15 DIAGNOSIS — L89522 Pressure ulcer of left ankle, stage 2: Secondary | ICD-10-CM | POA: Diagnosis not present

## 2020-02-15 DIAGNOSIS — I129 Hypertensive chronic kidney disease with stage 1 through stage 4 chronic kidney disease, or unspecified chronic kidney disease: Secondary | ICD-10-CM | POA: Diagnosis not present

## 2020-02-15 DIAGNOSIS — I82511 Chronic embolism and thrombosis of right femoral vein: Secondary | ICD-10-CM | POA: Diagnosis not present

## 2020-02-15 DIAGNOSIS — S72001D Fracture of unspecified part of neck of right femur, subsequent encounter for closed fracture with routine healing: Secondary | ICD-10-CM | POA: Diagnosis not present

## 2020-02-15 DIAGNOSIS — I69351 Hemiplegia and hemiparesis following cerebral infarction affecting right dominant side: Secondary | ICD-10-CM | POA: Diagnosis not present

## 2020-02-15 DIAGNOSIS — S82891D Other fracture of right lower leg, subsequent encounter for closed fracture with routine healing: Secondary | ICD-10-CM | POA: Diagnosis not present

## 2020-02-15 DIAGNOSIS — L89322 Pressure ulcer of left buttock, stage 2: Secondary | ICD-10-CM | POA: Diagnosis not present

## 2020-02-15 DIAGNOSIS — L89612 Pressure ulcer of right heel, stage 2: Secondary | ICD-10-CM | POA: Diagnosis not present

## 2020-02-19 DIAGNOSIS — L89612 Pressure ulcer of right heel, stage 2: Secondary | ICD-10-CM | POA: Diagnosis not present

## 2020-02-19 DIAGNOSIS — L89322 Pressure ulcer of left buttock, stage 2: Secondary | ICD-10-CM | POA: Diagnosis not present

## 2020-02-19 DIAGNOSIS — I69351 Hemiplegia and hemiparesis following cerebral infarction affecting right dominant side: Secondary | ICD-10-CM | POA: Diagnosis not present

## 2020-02-19 DIAGNOSIS — I129 Hypertensive chronic kidney disease with stage 1 through stage 4 chronic kidney disease, or unspecified chronic kidney disease: Secondary | ICD-10-CM | POA: Diagnosis not present

## 2020-02-19 DIAGNOSIS — N183 Chronic kidney disease, stage 3 unspecified: Secondary | ICD-10-CM | POA: Diagnosis not present

## 2020-02-19 DIAGNOSIS — S82891D Other fracture of right lower leg, subsequent encounter for closed fracture with routine healing: Secondary | ICD-10-CM | POA: Diagnosis not present

## 2020-02-19 DIAGNOSIS — I82511 Chronic embolism and thrombosis of right femoral vein: Secondary | ICD-10-CM | POA: Diagnosis not present

## 2020-02-19 DIAGNOSIS — S72001D Fracture of unspecified part of neck of right femur, subsequent encounter for closed fracture with routine healing: Secondary | ICD-10-CM | POA: Diagnosis not present

## 2020-02-19 DIAGNOSIS — L89522 Pressure ulcer of left ankle, stage 2: Secondary | ICD-10-CM | POA: Diagnosis not present

## 2020-02-20 DIAGNOSIS — L89322 Pressure ulcer of left buttock, stage 2: Secondary | ICD-10-CM | POA: Diagnosis not present

## 2020-02-20 DIAGNOSIS — I129 Hypertensive chronic kidney disease with stage 1 through stage 4 chronic kidney disease, or unspecified chronic kidney disease: Secondary | ICD-10-CM | POA: Diagnosis not present

## 2020-02-20 DIAGNOSIS — N183 Chronic kidney disease, stage 3 unspecified: Secondary | ICD-10-CM | POA: Diagnosis not present

## 2020-02-20 DIAGNOSIS — I82511 Chronic embolism and thrombosis of right femoral vein: Secondary | ICD-10-CM | POA: Diagnosis not present

## 2020-02-20 DIAGNOSIS — I69351 Hemiplegia and hemiparesis following cerebral infarction affecting right dominant side: Secondary | ICD-10-CM | POA: Diagnosis not present

## 2020-02-20 DIAGNOSIS — S82891D Other fracture of right lower leg, subsequent encounter for closed fracture with routine healing: Secondary | ICD-10-CM | POA: Diagnosis not present

## 2020-02-20 DIAGNOSIS — S72001D Fracture of unspecified part of neck of right femur, subsequent encounter for closed fracture with routine healing: Secondary | ICD-10-CM | POA: Diagnosis not present

## 2020-02-20 DIAGNOSIS — L89522 Pressure ulcer of left ankle, stage 2: Secondary | ICD-10-CM | POA: Diagnosis not present

## 2020-02-20 DIAGNOSIS — L89612 Pressure ulcer of right heel, stage 2: Secondary | ICD-10-CM | POA: Diagnosis not present

## 2020-02-21 ENCOUNTER — Telehealth: Payer: Self-pay | Admitting: Family Medicine

## 2020-02-21 DIAGNOSIS — L89612 Pressure ulcer of right heel, stage 2: Secondary | ICD-10-CM | POA: Diagnosis not present

## 2020-02-21 DIAGNOSIS — N183 Chronic kidney disease, stage 3 unspecified: Secondary | ICD-10-CM | POA: Diagnosis not present

## 2020-02-21 DIAGNOSIS — L89522 Pressure ulcer of left ankle, stage 2: Secondary | ICD-10-CM | POA: Diagnosis not present

## 2020-02-21 DIAGNOSIS — I129 Hypertensive chronic kidney disease with stage 1 through stage 4 chronic kidney disease, or unspecified chronic kidney disease: Secondary | ICD-10-CM | POA: Diagnosis not present

## 2020-02-21 DIAGNOSIS — S72001D Fracture of unspecified part of neck of right femur, subsequent encounter for closed fracture with routine healing: Secondary | ICD-10-CM | POA: Diagnosis not present

## 2020-02-21 DIAGNOSIS — S82891D Other fracture of right lower leg, subsequent encounter for closed fracture with routine healing: Secondary | ICD-10-CM | POA: Diagnosis not present

## 2020-02-21 DIAGNOSIS — I69351 Hemiplegia and hemiparesis following cerebral infarction affecting right dominant side: Secondary | ICD-10-CM | POA: Diagnosis not present

## 2020-02-21 DIAGNOSIS — L89322 Pressure ulcer of left buttock, stage 2: Secondary | ICD-10-CM | POA: Diagnosis not present

## 2020-02-21 DIAGNOSIS — I82511 Chronic embolism and thrombosis of right femoral vein: Secondary | ICD-10-CM | POA: Diagnosis not present

## 2020-02-21 NOTE — Telephone Encounter (Signed)
[name] from [facility] calling to get verbal orders for the patient. Please advise she can be reached at [phone].

## 2020-02-21 NOTE — Telephone Encounter (Signed)
Ok with me. Please place any necessary orders. 

## 2020-02-21 NOTE — Telephone Encounter (Signed)
LVM to return call.

## 2020-02-21 NOTE — Telephone Encounter (Signed)
Verbal order placed for patient to be assist with showers and dressing.

## 2020-02-21 NOTE — Telephone Encounter (Signed)
[name] form [facility]. Requesting verbal order to help Patient with showers and dressing. Please advised.

## 2020-02-22 DIAGNOSIS — N183 Chronic kidney disease, stage 3 unspecified: Secondary | ICD-10-CM | POA: Diagnosis not present

## 2020-02-22 DIAGNOSIS — I129 Hypertensive chronic kidney disease with stage 1 through stage 4 chronic kidney disease, or unspecified chronic kidney disease: Secondary | ICD-10-CM | POA: Diagnosis not present

## 2020-02-22 DIAGNOSIS — I82511 Chronic embolism and thrombosis of right femoral vein: Secondary | ICD-10-CM | POA: Diagnosis not present

## 2020-02-22 DIAGNOSIS — S82891D Other fracture of right lower leg, subsequent encounter for closed fracture with routine healing: Secondary | ICD-10-CM | POA: Diagnosis not present

## 2020-02-22 DIAGNOSIS — L89612 Pressure ulcer of right heel, stage 2: Secondary | ICD-10-CM | POA: Diagnosis not present

## 2020-02-22 DIAGNOSIS — L89322 Pressure ulcer of left buttock, stage 2: Secondary | ICD-10-CM | POA: Diagnosis not present

## 2020-02-22 DIAGNOSIS — L89522 Pressure ulcer of left ankle, stage 2: Secondary | ICD-10-CM | POA: Diagnosis not present

## 2020-02-22 DIAGNOSIS — I69351 Hemiplegia and hemiparesis following cerebral infarction affecting right dominant side: Secondary | ICD-10-CM | POA: Diagnosis not present

## 2020-02-22 DIAGNOSIS — S72001D Fracture of unspecified part of neck of right femur, subsequent encounter for closed fracture with routine healing: Secondary | ICD-10-CM | POA: Diagnosis not present

## 2020-02-26 DIAGNOSIS — L89612 Pressure ulcer of right heel, stage 2: Secondary | ICD-10-CM | POA: Diagnosis not present

## 2020-02-26 DIAGNOSIS — L89322 Pressure ulcer of left buttock, stage 2: Secondary | ICD-10-CM | POA: Diagnosis not present

## 2020-02-26 DIAGNOSIS — S82891D Other fracture of right lower leg, subsequent encounter for closed fracture with routine healing: Secondary | ICD-10-CM | POA: Diagnosis not present

## 2020-02-26 DIAGNOSIS — I82511 Chronic embolism and thrombosis of right femoral vein: Secondary | ICD-10-CM | POA: Diagnosis not present

## 2020-02-26 DIAGNOSIS — S72001D Fracture of unspecified part of neck of right femur, subsequent encounter for closed fracture with routine healing: Secondary | ICD-10-CM | POA: Diagnosis not present

## 2020-02-26 DIAGNOSIS — N183 Chronic kidney disease, stage 3 unspecified: Secondary | ICD-10-CM | POA: Diagnosis not present

## 2020-02-26 DIAGNOSIS — I69351 Hemiplegia and hemiparesis following cerebral infarction affecting right dominant side: Secondary | ICD-10-CM | POA: Diagnosis not present

## 2020-02-26 DIAGNOSIS — L89522 Pressure ulcer of left ankle, stage 2: Secondary | ICD-10-CM | POA: Diagnosis not present

## 2020-02-26 DIAGNOSIS — I129 Hypertensive chronic kidney disease with stage 1 through stage 4 chronic kidney disease, or unspecified chronic kidney disease: Secondary | ICD-10-CM | POA: Diagnosis not present

## 2020-02-27 DIAGNOSIS — L89322 Pressure ulcer of left buttock, stage 2: Secondary | ICD-10-CM | POA: Diagnosis not present

## 2020-02-27 DIAGNOSIS — S72001D Fracture of unspecified part of neck of right femur, subsequent encounter for closed fracture with routine healing: Secondary | ICD-10-CM | POA: Diagnosis not present

## 2020-02-27 DIAGNOSIS — I82511 Chronic embolism and thrombosis of right femoral vein: Secondary | ICD-10-CM | POA: Diagnosis not present

## 2020-02-27 DIAGNOSIS — I69351 Hemiplegia and hemiparesis following cerebral infarction affecting right dominant side: Secondary | ICD-10-CM | POA: Diagnosis not present

## 2020-02-27 DIAGNOSIS — S82891D Other fracture of right lower leg, subsequent encounter for closed fracture with routine healing: Secondary | ICD-10-CM | POA: Diagnosis not present

## 2020-02-27 DIAGNOSIS — I129 Hypertensive chronic kidney disease with stage 1 through stage 4 chronic kidney disease, or unspecified chronic kidney disease: Secondary | ICD-10-CM | POA: Diagnosis not present

## 2020-02-27 DIAGNOSIS — L89612 Pressure ulcer of right heel, stage 2: Secondary | ICD-10-CM | POA: Diagnosis not present

## 2020-02-27 DIAGNOSIS — N183 Chronic kidney disease, stage 3 unspecified: Secondary | ICD-10-CM | POA: Diagnosis not present

## 2020-02-27 DIAGNOSIS — L89522 Pressure ulcer of left ankle, stage 2: Secondary | ICD-10-CM | POA: Diagnosis not present

## 2020-02-29 DIAGNOSIS — L89322 Pressure ulcer of left buttock, stage 2: Secondary | ICD-10-CM | POA: Diagnosis not present

## 2020-02-29 DIAGNOSIS — L89612 Pressure ulcer of right heel, stage 2: Secondary | ICD-10-CM | POA: Diagnosis not present

## 2020-02-29 DIAGNOSIS — I129 Hypertensive chronic kidney disease with stage 1 through stage 4 chronic kidney disease, or unspecified chronic kidney disease: Secondary | ICD-10-CM | POA: Diagnosis not present

## 2020-02-29 DIAGNOSIS — S72001D Fracture of unspecified part of neck of right femur, subsequent encounter for closed fracture with routine healing: Secondary | ICD-10-CM | POA: Diagnosis not present

## 2020-02-29 DIAGNOSIS — S82891D Other fracture of right lower leg, subsequent encounter for closed fracture with routine healing: Secondary | ICD-10-CM | POA: Diagnosis not present

## 2020-02-29 DIAGNOSIS — N183 Chronic kidney disease, stage 3 unspecified: Secondary | ICD-10-CM | POA: Diagnosis not present

## 2020-02-29 DIAGNOSIS — L89522 Pressure ulcer of left ankle, stage 2: Secondary | ICD-10-CM | POA: Diagnosis not present

## 2020-02-29 DIAGNOSIS — I69351 Hemiplegia and hemiparesis following cerebral infarction affecting right dominant side: Secondary | ICD-10-CM | POA: Diagnosis not present

## 2020-02-29 DIAGNOSIS — I82511 Chronic embolism and thrombosis of right femoral vein: Secondary | ICD-10-CM | POA: Diagnosis not present

## 2020-03-01 DIAGNOSIS — S82891D Other fracture of right lower leg, subsequent encounter for closed fracture with routine healing: Secondary | ICD-10-CM | POA: Diagnosis not present

## 2020-03-01 DIAGNOSIS — N183 Chronic kidney disease, stage 3 unspecified: Secondary | ICD-10-CM | POA: Diagnosis not present

## 2020-03-01 DIAGNOSIS — S72001D Fracture of unspecified part of neck of right femur, subsequent encounter for closed fracture with routine healing: Secondary | ICD-10-CM | POA: Diagnosis not present

## 2020-03-01 DIAGNOSIS — L89522 Pressure ulcer of left ankle, stage 2: Secondary | ICD-10-CM | POA: Diagnosis not present

## 2020-03-01 DIAGNOSIS — L89612 Pressure ulcer of right heel, stage 2: Secondary | ICD-10-CM | POA: Diagnosis not present

## 2020-03-01 DIAGNOSIS — I129 Hypertensive chronic kidney disease with stage 1 through stage 4 chronic kidney disease, or unspecified chronic kidney disease: Secondary | ICD-10-CM | POA: Diagnosis not present

## 2020-03-01 DIAGNOSIS — I69351 Hemiplegia and hemiparesis following cerebral infarction affecting right dominant side: Secondary | ICD-10-CM | POA: Diagnosis not present

## 2020-03-01 DIAGNOSIS — I82511 Chronic embolism and thrombosis of right femoral vein: Secondary | ICD-10-CM | POA: Diagnosis not present

## 2020-03-01 DIAGNOSIS — L89322 Pressure ulcer of left buttock, stage 2: Secondary | ICD-10-CM | POA: Diagnosis not present

## 2020-03-04 DIAGNOSIS — N183 Chronic kidney disease, stage 3 unspecified: Secondary | ICD-10-CM | POA: Diagnosis not present

## 2020-03-04 DIAGNOSIS — I82511 Chronic embolism and thrombosis of right femoral vein: Secondary | ICD-10-CM | POA: Diagnosis not present

## 2020-03-04 DIAGNOSIS — L89322 Pressure ulcer of left buttock, stage 2: Secondary | ICD-10-CM | POA: Diagnosis not present

## 2020-03-04 DIAGNOSIS — S72001D Fracture of unspecified part of neck of right femur, subsequent encounter for closed fracture with routine healing: Secondary | ICD-10-CM | POA: Diagnosis not present

## 2020-03-04 DIAGNOSIS — L89522 Pressure ulcer of left ankle, stage 2: Secondary | ICD-10-CM | POA: Diagnosis not present

## 2020-03-04 DIAGNOSIS — I129 Hypertensive chronic kidney disease with stage 1 through stage 4 chronic kidney disease, or unspecified chronic kidney disease: Secondary | ICD-10-CM | POA: Diagnosis not present

## 2020-03-04 DIAGNOSIS — I69351 Hemiplegia and hemiparesis following cerebral infarction affecting right dominant side: Secondary | ICD-10-CM | POA: Diagnosis not present

## 2020-03-04 DIAGNOSIS — S82891D Other fracture of right lower leg, subsequent encounter for closed fracture with routine healing: Secondary | ICD-10-CM | POA: Diagnosis not present

## 2020-03-04 DIAGNOSIS — L89612 Pressure ulcer of right heel, stage 2: Secondary | ICD-10-CM | POA: Diagnosis not present

## 2020-03-06 DIAGNOSIS — I82511 Chronic embolism and thrombosis of right femoral vein: Secondary | ICD-10-CM | POA: Diagnosis not present

## 2020-03-06 DIAGNOSIS — I129 Hypertensive chronic kidney disease with stage 1 through stage 4 chronic kidney disease, or unspecified chronic kidney disease: Secondary | ICD-10-CM | POA: Diagnosis not present

## 2020-03-06 DIAGNOSIS — S82891D Other fracture of right lower leg, subsequent encounter for closed fracture with routine healing: Secondary | ICD-10-CM | POA: Diagnosis not present

## 2020-03-06 DIAGNOSIS — N183 Chronic kidney disease, stage 3 unspecified: Secondary | ICD-10-CM | POA: Diagnosis not present

## 2020-03-06 DIAGNOSIS — I69351 Hemiplegia and hemiparesis following cerebral infarction affecting right dominant side: Secondary | ICD-10-CM | POA: Diagnosis not present

## 2020-03-06 DIAGNOSIS — L89522 Pressure ulcer of left ankle, stage 2: Secondary | ICD-10-CM | POA: Diagnosis not present

## 2020-03-06 DIAGNOSIS — L89612 Pressure ulcer of right heel, stage 2: Secondary | ICD-10-CM | POA: Diagnosis not present

## 2020-03-06 DIAGNOSIS — S72001D Fracture of unspecified part of neck of right femur, subsequent encounter for closed fracture with routine healing: Secondary | ICD-10-CM | POA: Diagnosis not present

## 2020-03-06 DIAGNOSIS — L89322 Pressure ulcer of left buttock, stage 2: Secondary | ICD-10-CM | POA: Diagnosis not present

## 2020-03-11 ENCOUNTER — Telehealth: Payer: Self-pay | Admitting: Family Medicine

## 2020-03-11 DIAGNOSIS — S72001D Fracture of unspecified part of neck of right femur, subsequent encounter for closed fracture with routine healing: Secondary | ICD-10-CM | POA: Diagnosis not present

## 2020-03-11 DIAGNOSIS — L89612 Pressure ulcer of right heel, stage 2: Secondary | ICD-10-CM | POA: Diagnosis not present

## 2020-03-11 DIAGNOSIS — I82511 Chronic embolism and thrombosis of right femoral vein: Secondary | ICD-10-CM | POA: Diagnosis not present

## 2020-03-11 DIAGNOSIS — I129 Hypertensive chronic kidney disease with stage 1 through stage 4 chronic kidney disease, or unspecified chronic kidney disease: Secondary | ICD-10-CM | POA: Diagnosis not present

## 2020-03-11 DIAGNOSIS — L89522 Pressure ulcer of left ankle, stage 2: Secondary | ICD-10-CM | POA: Diagnosis not present

## 2020-03-11 DIAGNOSIS — N183 Chronic kidney disease, stage 3 unspecified: Secondary | ICD-10-CM | POA: Diagnosis not present

## 2020-03-11 DIAGNOSIS — L89322 Pressure ulcer of left buttock, stage 2: Secondary | ICD-10-CM | POA: Diagnosis not present

## 2020-03-11 DIAGNOSIS — I69351 Hemiplegia and hemiparesis following cerebral infarction affecting right dominant side: Secondary | ICD-10-CM | POA: Diagnosis not present

## 2020-03-11 DIAGNOSIS — S82891D Other fracture of right lower leg, subsequent encounter for closed fracture with routine healing: Secondary | ICD-10-CM | POA: Diagnosis not present

## 2020-03-11 NOTE — Telephone Encounter (Signed)
OT from [facility] called wanting to report a fall. Pt fell on Friday, 03/08/2020 while transferring from toilet to wheelchair. OT reports of no injury from fall.

## 2020-03-11 NOTE — Telephone Encounter (Signed)
Noted  

## 2020-03-11 NOTE — Telephone Encounter (Signed)
FYI on pt from [facility].

## 2020-03-12 DIAGNOSIS — I82511 Chronic embolism and thrombosis of right femoral vein: Secondary | ICD-10-CM | POA: Diagnosis not present

## 2020-03-12 DIAGNOSIS — S72001D Fracture of unspecified part of neck of right femur, subsequent encounter for closed fracture with routine healing: Secondary | ICD-10-CM | POA: Diagnosis not present

## 2020-03-12 DIAGNOSIS — L89522 Pressure ulcer of left ankle, stage 2: Secondary | ICD-10-CM | POA: Diagnosis not present

## 2020-03-12 DIAGNOSIS — I69351 Hemiplegia and hemiparesis following cerebral infarction affecting right dominant side: Secondary | ICD-10-CM | POA: Diagnosis not present

## 2020-03-12 DIAGNOSIS — L89612 Pressure ulcer of right heel, stage 2: Secondary | ICD-10-CM | POA: Diagnosis not present

## 2020-03-12 DIAGNOSIS — I129 Hypertensive chronic kidney disease with stage 1 through stage 4 chronic kidney disease, or unspecified chronic kidney disease: Secondary | ICD-10-CM | POA: Diagnosis not present

## 2020-03-12 DIAGNOSIS — N183 Chronic kidney disease, stage 3 unspecified: Secondary | ICD-10-CM | POA: Diagnosis not present

## 2020-03-12 DIAGNOSIS — S82891D Other fracture of right lower leg, subsequent encounter for closed fracture with routine healing: Secondary | ICD-10-CM | POA: Diagnosis not present

## 2020-03-12 DIAGNOSIS — L89322 Pressure ulcer of left buttock, stage 2: Secondary | ICD-10-CM | POA: Diagnosis not present

## 2020-03-13 DIAGNOSIS — I82511 Chronic embolism and thrombosis of right femoral vein: Secondary | ICD-10-CM | POA: Diagnosis not present

## 2020-03-13 DIAGNOSIS — L89322 Pressure ulcer of left buttock, stage 2: Secondary | ICD-10-CM | POA: Diagnosis not present

## 2020-03-13 DIAGNOSIS — N183 Chronic kidney disease, stage 3 unspecified: Secondary | ICD-10-CM | POA: Diagnosis not present

## 2020-03-13 DIAGNOSIS — L89612 Pressure ulcer of right heel, stage 2: Secondary | ICD-10-CM | POA: Diagnosis not present

## 2020-03-13 DIAGNOSIS — I129 Hypertensive chronic kidney disease with stage 1 through stage 4 chronic kidney disease, or unspecified chronic kidney disease: Secondary | ICD-10-CM | POA: Diagnosis not present

## 2020-03-13 DIAGNOSIS — S82891D Other fracture of right lower leg, subsequent encounter for closed fracture with routine healing: Secondary | ICD-10-CM | POA: Diagnosis not present

## 2020-03-13 DIAGNOSIS — S72001D Fracture of unspecified part of neck of right femur, subsequent encounter for closed fracture with routine healing: Secondary | ICD-10-CM | POA: Diagnosis not present

## 2020-03-13 DIAGNOSIS — L89522 Pressure ulcer of left ankle, stage 2: Secondary | ICD-10-CM | POA: Diagnosis not present

## 2020-03-13 DIAGNOSIS — I69351 Hemiplegia and hemiparesis following cerebral infarction affecting right dominant side: Secondary | ICD-10-CM | POA: Diagnosis not present

## 2020-03-15 ENCOUNTER — Other Ambulatory Visit: Payer: Self-pay | Admitting: *Deleted

## 2020-03-15 ENCOUNTER — Telehealth: Payer: Self-pay | Admitting: Family Medicine

## 2020-03-15 MED ORDER — AMLODIPINE BESYLATE 10 MG PO TABS: 10.0000 mg | ORAL_TABLET | Freq: Every day | ORAL | 0 refills | Status: DC

## 2020-03-15 NOTE — Telephone Encounter (Signed)
Patient's wife is calling in requesting an appointment and states that he has been out of his amLODipine (NORVASC) 10 MG tablet/hydrALAZINE (APRESOLINE) 50 MG tablet/ hydrALAZINE (APRESOLINE) 50 MG tablet and the nurse stopped by the other day and took [name]' blood pressure and states it was high. Wife states they are only able to do morning appointments, next available is not until Thursday.

## 2020-03-15 NOTE — Telephone Encounter (Signed)
Please refill meds and schedule patient to see me for in person office visit at the next available slot that works for them.  Daniel [name]. [name], MD 03/15/2020 3:13 PM

## 2020-03-15 NOTE — Telephone Encounter (Signed)
Unable to contact Patient, phone number not a working number

## 2020-03-17 DIAGNOSIS — S72141A Displaced intertrochanteric fracture of right femur, initial encounter for closed fracture: Secondary | ICD-10-CM | POA: Diagnosis not present

## 2020-03-18 DIAGNOSIS — L89522 Pressure ulcer of left ankle, stage 2: Secondary | ICD-10-CM | POA: Diagnosis not present

## 2020-03-18 DIAGNOSIS — L89612 Pressure ulcer of right heel, stage 2: Secondary | ICD-10-CM | POA: Diagnosis not present

## 2020-03-18 DIAGNOSIS — N183 Chronic kidney disease, stage 3 unspecified: Secondary | ICD-10-CM | POA: Diagnosis not present

## 2020-03-18 DIAGNOSIS — I82511 Chronic embolism and thrombosis of right femoral vein: Secondary | ICD-10-CM | POA: Diagnosis not present

## 2020-03-18 DIAGNOSIS — L89322 Pressure ulcer of left buttock, stage 2: Secondary | ICD-10-CM | POA: Diagnosis not present

## 2020-03-18 DIAGNOSIS — I129 Hypertensive chronic kidney disease with stage 1 through stage 4 chronic kidney disease, or unspecified chronic kidney disease: Secondary | ICD-10-CM | POA: Diagnosis not present

## 2020-03-18 DIAGNOSIS — I69351 Hemiplegia and hemiparesis following cerebral infarction affecting right dominant side: Secondary | ICD-10-CM | POA: Diagnosis not present

## 2020-03-18 DIAGNOSIS — S72001D Fracture of unspecified part of neck of right femur, subsequent encounter for closed fracture with routine healing: Secondary | ICD-10-CM | POA: Diagnosis not present

## 2020-03-18 DIAGNOSIS — S82891D Other fracture of right lower leg, subsequent encounter for closed fracture with routine healing: Secondary | ICD-10-CM | POA: Diagnosis not present

## 2020-03-20 DIAGNOSIS — S72001D Fracture of unspecified part of neck of right femur, subsequent encounter for closed fracture with routine healing: Secondary | ICD-10-CM | POA: Diagnosis not present

## 2020-03-20 DIAGNOSIS — I129 Hypertensive chronic kidney disease with stage 1 through stage 4 chronic kidney disease, or unspecified chronic kidney disease: Secondary | ICD-10-CM | POA: Diagnosis not present

## 2020-03-20 DIAGNOSIS — I69351 Hemiplegia and hemiparesis following cerebral infarction affecting right dominant side: Secondary | ICD-10-CM | POA: Diagnosis not present

## 2020-03-20 DIAGNOSIS — S82891D Other fracture of right lower leg, subsequent encounter for closed fracture with routine healing: Secondary | ICD-10-CM | POA: Diagnosis not present

## 2020-03-20 DIAGNOSIS — I82511 Chronic embolism and thrombosis of right femoral vein: Secondary | ICD-10-CM | POA: Diagnosis not present

## 2020-03-20 DIAGNOSIS — L89322 Pressure ulcer of left buttock, stage 2: Secondary | ICD-10-CM | POA: Diagnosis not present

## 2020-03-20 DIAGNOSIS — L89612 Pressure ulcer of right heel, stage 2: Secondary | ICD-10-CM | POA: Diagnosis not present

## 2020-03-20 DIAGNOSIS — N183 Chronic kidney disease, stage 3 unspecified: Secondary | ICD-10-CM | POA: Diagnosis not present

## 2020-03-20 DIAGNOSIS — L89522 Pressure ulcer of left ankle, stage 2: Secondary | ICD-10-CM | POA: Diagnosis not present

## 2020-03-22 DIAGNOSIS — I129 Hypertensive chronic kidney disease with stage 1 through stage 4 chronic kidney disease, or unspecified chronic kidney disease: Secondary | ICD-10-CM | POA: Diagnosis not present

## 2020-03-22 DIAGNOSIS — S82891D Other fracture of right lower leg, subsequent encounter for closed fracture with routine healing: Secondary | ICD-10-CM | POA: Diagnosis not present

## 2020-03-22 DIAGNOSIS — L89522 Pressure ulcer of left ankle, stage 2: Secondary | ICD-10-CM | POA: Diagnosis not present

## 2020-03-22 DIAGNOSIS — S72001D Fracture of unspecified part of neck of right femur, subsequent encounter for closed fracture with routine healing: Secondary | ICD-10-CM | POA: Diagnosis not present

## 2020-03-22 DIAGNOSIS — N183 Chronic kidney disease, stage 3 unspecified: Secondary | ICD-10-CM | POA: Diagnosis not present

## 2020-03-22 DIAGNOSIS — I82511 Chronic embolism and thrombosis of right femoral vein: Secondary | ICD-10-CM | POA: Diagnosis not present

## 2020-03-22 DIAGNOSIS — L89322 Pressure ulcer of left buttock, stage 2: Secondary | ICD-10-CM | POA: Diagnosis not present

## 2020-03-22 DIAGNOSIS — I69351 Hemiplegia and hemiparesis following cerebral infarction affecting right dominant side: Secondary | ICD-10-CM | POA: Diagnosis not present

## 2020-03-22 DIAGNOSIS — L89612 Pressure ulcer of right heel, stage 2: Secondary | ICD-10-CM | POA: Diagnosis not present

## 2020-03-25 ENCOUNTER — Other Ambulatory Visit: Payer: Self-pay | Admitting: Family Medicine

## 2020-03-25 DIAGNOSIS — L89522 Pressure ulcer of left ankle, stage 2: Secondary | ICD-10-CM | POA: Diagnosis not present

## 2020-03-25 DIAGNOSIS — I82511 Chronic embolism and thrombosis of right femoral vein: Secondary | ICD-10-CM | POA: Diagnosis not present

## 2020-03-25 DIAGNOSIS — S82891D Other fracture of right lower leg, subsequent encounter for closed fracture with routine healing: Secondary | ICD-10-CM | POA: Diagnosis not present

## 2020-03-25 DIAGNOSIS — N183 Chronic kidney disease, stage 3 unspecified: Secondary | ICD-10-CM | POA: Diagnosis not present

## 2020-03-25 DIAGNOSIS — L89322 Pressure ulcer of left buttock, stage 2: Secondary | ICD-10-CM | POA: Diagnosis not present

## 2020-03-25 DIAGNOSIS — I69351 Hemiplegia and hemiparesis following cerebral infarction affecting right dominant side: Secondary | ICD-10-CM | POA: Diagnosis not present

## 2020-03-25 DIAGNOSIS — L89612 Pressure ulcer of right heel, stage 2: Secondary | ICD-10-CM | POA: Diagnosis not present

## 2020-03-25 DIAGNOSIS — I129 Hypertensive chronic kidney disease with stage 1 through stage 4 chronic kidney disease, or unspecified chronic kidney disease: Secondary | ICD-10-CM | POA: Diagnosis not present

## 2020-03-25 DIAGNOSIS — S72001D Fracture of unspecified part of neck of right femur, subsequent encounter for closed fracture with routine healing: Secondary | ICD-10-CM | POA: Diagnosis not present

## 2020-03-27 DIAGNOSIS — L89522 Pressure ulcer of left ankle, stage 2: Secondary | ICD-10-CM | POA: Diagnosis not present

## 2020-03-27 DIAGNOSIS — N183 Chronic kidney disease, stage 3 unspecified: Secondary | ICD-10-CM | POA: Diagnosis not present

## 2020-03-27 DIAGNOSIS — I82511 Chronic embolism and thrombosis of right femoral vein: Secondary | ICD-10-CM | POA: Diagnosis not present

## 2020-03-27 DIAGNOSIS — I69351 Hemiplegia and hemiparesis following cerebral infarction affecting right dominant side: Secondary | ICD-10-CM | POA: Diagnosis not present

## 2020-03-27 DIAGNOSIS — S82891D Other fracture of right lower leg, subsequent encounter for closed fracture with routine healing: Secondary | ICD-10-CM | POA: Diagnosis not present

## 2020-03-27 DIAGNOSIS — L89322 Pressure ulcer of left buttock, stage 2: Secondary | ICD-10-CM | POA: Diagnosis not present

## 2020-03-27 DIAGNOSIS — L89612 Pressure ulcer of right heel, stage 2: Secondary | ICD-10-CM | POA: Diagnosis not present

## 2020-03-27 DIAGNOSIS — S72001D Fracture of unspecified part of neck of right femur, subsequent encounter for closed fracture with routine healing: Secondary | ICD-10-CM | POA: Diagnosis not present

## 2020-03-27 DIAGNOSIS — I129 Hypertensive chronic kidney disease with stage 1 through stage 4 chronic kidney disease, or unspecified chronic kidney disease: Secondary | ICD-10-CM | POA: Diagnosis not present

## 2020-03-28 ENCOUNTER — Telehealth: Payer: Self-pay | Admitting: Family Medicine

## 2020-03-28 DIAGNOSIS — N183 Chronic kidney disease, stage 3 unspecified: Secondary | ICD-10-CM | POA: Diagnosis not present

## 2020-03-28 DIAGNOSIS — I739 Peripheral vascular disease, unspecified: Secondary | ICD-10-CM | POA: Diagnosis not present

## 2020-03-28 DIAGNOSIS — S82891D Other fracture of right lower leg, subsequent encounter for closed fracture with routine healing: Secondary | ICD-10-CM | POA: Diagnosis not present

## 2020-03-28 DIAGNOSIS — S72001D Fracture of unspecified part of neck of right femur, subsequent encounter for closed fracture with routine healing: Secondary | ICD-10-CM | POA: Diagnosis not present

## 2020-03-28 DIAGNOSIS — L89612 Pressure ulcer of right heel, stage 2: Secondary | ICD-10-CM | POA: Diagnosis not present

## 2020-03-28 DIAGNOSIS — F2089 Other schizophrenia: Secondary | ICD-10-CM | POA: Diagnosis not present

## 2020-03-28 DIAGNOSIS — I129 Hypertensive chronic kidney disease with stage 1 through stage 4 chronic kidney disease, or unspecified chronic kidney disease: Secondary | ICD-10-CM | POA: Diagnosis not present

## 2020-03-28 DIAGNOSIS — I82511 Chronic embolism and thrombosis of right femoral vein: Secondary | ICD-10-CM | POA: Diagnosis not present

## 2020-03-28 DIAGNOSIS — I69351 Hemiplegia and hemiparesis following cerebral infarction affecting right dominant side: Secondary | ICD-10-CM | POA: Diagnosis not present

## 2020-03-28 NOTE — Telephone Encounter (Signed)
Notified Tillie Rung ok for verbal order

## 2020-03-28 NOTE — Telephone Encounter (Signed)
Daniel [name] (physical therapist) called requesting verbal orders for pt. Requesting to see patient twice a week x 2 weeks and once a week x 2 weeks, focusing on walking & gate, balance & strength. Please advise.

## 2020-03-29 ENCOUNTER — Inpatient Hospital Stay: Payer: Medicare PPO | Admitting: Family Medicine

## 2020-03-29 ENCOUNTER — Ambulatory Visit (INDEPENDENT_AMBULATORY_CARE_PROVIDER_SITE_OTHER): Payer: Medicare PPO | Admitting: Family Medicine

## 2020-03-29 ENCOUNTER — Encounter: Payer: Self-pay | Admitting: Family Medicine

## 2020-03-29 VITALS — BP 160/80 | HR 64 | Temp 98.2°F | Ht 73.0 in | Wt 129.0 lb

## 2020-03-29 DIAGNOSIS — D649 Anemia, unspecified: Secondary | ICD-10-CM

## 2020-03-29 DIAGNOSIS — E785 Hyperlipidemia, unspecified: Secondary | ICD-10-CM | POA: Diagnosis not present

## 2020-03-29 DIAGNOSIS — R739 Hyperglycemia, unspecified: Secondary | ICD-10-CM

## 2020-03-29 DIAGNOSIS — I69959 Hemiplegia and hemiparesis following unspecified cerebrovascular disease affecting unspecified side: Secondary | ICD-10-CM

## 2020-03-29 DIAGNOSIS — S72141A Displaced intertrochanteric fracture of right femur, initial encounter for closed fracture: Secondary | ICD-10-CM

## 2020-03-29 DIAGNOSIS — N183 Chronic kidney disease, stage 3 unspecified: Secondary | ICD-10-CM | POA: Diagnosis not present

## 2020-03-29 DIAGNOSIS — I129 Hypertensive chronic kidney disease with stage 1 through stage 4 chronic kidney disease, or unspecified chronic kidney disease: Secondary | ICD-10-CM | POA: Diagnosis not present

## 2020-03-29 DIAGNOSIS — I739 Peripheral vascular disease, unspecified: Secondary | ICD-10-CM

## 2020-03-29 LAB — LIPID PANEL
Cholesterol: 124 mg/dL (ref 0–200)
HDL: 53.1 mg/dL (ref >39.00)
LDL Cholesterol: 62 mg/dL (ref 0–99)
NonHDL: 71.11
Total CHOL/HDL Ratio: 2
Triglycerides: 47 mg/dL (ref 0.0–149.0)
VLDL: 9.4 mg/dL (ref 0.0–40.0)

## 2020-03-29 LAB — COMPREHENSIVE METABOLIC PANEL
ALT: 13 U/L (ref 0–53)
AST: 18 U/L (ref 0–37)
Albumin: 4.2 g/dL (ref 3.5–5.2)
Alkaline Phosphatase: 112 U/L (ref 39–117)
BUN: 25 mg/dL — ABNORMAL HIGH (ref 6–23)
CO2: 30 mEq/L (ref 19–32)
Calcium: 9.5 mg/dL (ref 8.4–10.5)
Chloride: 102 mEq/L (ref 96–112)
Creatinine, Ser: 2.04 mg/dL — ABNORMAL HIGH (ref 0.40–1.50)
GFR: 38.78 mL/min — ABNORMAL LOW (ref >60.00)
Glucose, Bld: 90 mg/dL (ref 70–99)
Potassium: 4.2 mEq/L (ref 3.5–5.1)
Sodium: 140 mEq/L (ref 135–145)
Total Bilirubin: 0.4 mg/dL (ref 0.2–1.2)
Total Protein: 7.4 g/dL (ref 6.0–8.3)

## 2020-03-29 LAB — CBC
HCT: 33.1 % — ABNORMAL LOW (ref 39.0–52.0)
Hemoglobin: 11 g/dL — ABNORMAL LOW (ref 13.0–17.0)
MCHC: 33.1 g/dL (ref 30.0–36.0)
MCV: 89.3 fl (ref 78.0–100.0)
Platelets: 249 10*3/uL (ref 150.0–400.0)
RBC: 3.7 Mil/uL — ABNORMAL LOW (ref 4.22–5.81)
RDW: 15.8 % — ABNORMAL HIGH (ref 11.5–15.5)
WBC: 4.2 10*3/uL (ref 4.0–10.5)

## 2020-03-29 LAB — TSH: TSH: 1.67 u[IU]/mL (ref 0.35–4.50)

## 2020-03-29 LAB — HEMOGLOBIN A1C: Hgb A1c MFr Bld: 5 % (ref 4.6–6.5)

## 2020-03-29 MED ORDER — CLOPIDOGREL BISULFATE 75 MG PO TABS: 75.0000 mg | ORAL_TABLET | Freq: Every day | ORAL | 3 refills | Status: DC

## 2020-03-29 MED ORDER — METOPROLOL TARTRATE 100 MG PO TABS: 100.0000 mg | ORAL_TABLET | Freq: Two times a day (BID) | ORAL | 3 refills | Status: DC

## 2020-03-29 MED ORDER — AMLODIPINE BESYLATE 10 MG PO TABS: 10.0000 mg | ORAL_TABLET | Freq: Every day | ORAL | 3 refills | Status: DC

## 2020-03-29 MED ORDER — ATORVASTATIN CALCIUM 40 MG PO TABS: 40.0000 mg | ORAL_TABLET | Freq: Every day | ORAL | 3 refills | Status: DC

## 2020-03-29 MED ORDER — LISINOPRIL 20 MG PO TABS: 20.0000 mg | ORAL_TABLET | Freq: Every day | ORAL | 3 refills | Status: DC

## 2020-03-29 MED ORDER — GABAPENTIN 100 MG PO CAPS: ORAL_CAPSULE | ORAL | 3 refills | Status: DC

## 2020-03-29 MED ORDER — FOLIC ACID 1 MG PO TABS: 1.0000 mg | ORAL_TABLET | Freq: Every day | ORAL | 3 refills | Status: DC

## 2020-03-29 NOTE — Progress Notes (Addendum)
   Daniel [name] is a 74 y.o. male who presents today for an office visit.  Assessment/Plan:  Chronic Problems Addressed Today: Benign hypertension with CKD (chronic kidney disease) stage III We will check labs today including CBC, C met, TSH.  Slightly above goal today.  Will continue amlodipine 10 mg daily, lisinopril 20 mg daily, metoprolol tartrate 100 mg twice daily.  If continues to be elevated next follow-up will likely increase dose of lisinopril to 40 mg or increase dose of metoprolol.  He has not been taking his hydralazine.  Peripheral vascular disease Continue Plavix and 5 mg daily and Lipitor 40 mg daily.  Closed displaced intertrochanteric fracture of right femur [facility]) Doing well with home health physical therapy.  Hyperlipidemia Check lipid panel today.  Continue Lipitor 40 mg daily.  Anemia Check CBC today.    Subjective:  HPI:  Patient was last seen 2 years ago.  Unfortunately about 3 months ago he suffered a right hip fracture and was admitted to the hospital.  His postoperative course was complicated by DVT.  He was discharged to rehab facility.  He has been home for the last few weeks and has been working with home health physical therapy.  Overall thinks his recovery process is going well.  He is here with his granddaughter.  He is no longer on any blood thinner medications.  He request refill on several of his medications.  He has been tolerating well.       Objective:  Physical Exam: BP (!) 160/80 (BP Location: Right Leg, Patient Position: Sitting, Cuff Size: Normal)   Pulse 64   Temp 98.2 F (36.8 C) (Temporal)   Ht '6\' 1"'$  (1.854 m)   Wt 129 lb (58.5 kg)   SpO2 98%   BMI 17.02 kg/m   Gen: No acute distress, resting comfortably CV: Regular rate and rhythm with no murmurs appreciated Pulm: Normal work of breathing, clear to auscultation bilaterally with no crackles, wheezes, or rhonchi Neuro: Grossly normal, moves all extremities Psych: Normal affect  and thought content  Time Spent: 46 minutes of total time was spent on the date of the encounter performing the following actions: chart review prior to seeing the patient, obtaining history, performing a medically necessary exam, counseling on the treatment plan, placing orders, and documenting in our EHR.        [name]. [name], MD 03/29/2020 9:45 AM

## 2020-03-29 NOTE — Assessment & Plan Note (Signed)
Doing well with home health physical therapy.

## 2020-03-29 NOTE — Assessment & Plan Note (Signed)
Check lipid panel today.  Continue Lipitor 40 mg daily.

## 2020-03-29 NOTE — Assessment & Plan Note (Signed)
Check CBC today.  

## 2020-03-29 NOTE — Patient Instructions (Signed)
It was very nice to see you today!  I will refill your medications today.  We will check blood work today.  We will see back in 3 to 6 months.  Next checkup, or sooner if needed.  Take care, Dr [name]  Please try these tips to maintain a healthy lifestyle:   Eat at least 3 REAL meals and 1-2 snacks per day.  Aim for no more than 5 hours between eating.  If you eat breakfast, please do so within one hour of getting up.    Each meal should contain half fruits/vegetables, one quarter protein, and one quarter carbs (no bigger than a computer mouse)   Cut down on sweet beverages. This includes juice, soda, and sweet tea.     Drink at least 1 glass of water with each meal and aim for at least 8 glasses per day   Exercise at least 150 minutes every week.

## 2020-03-29 NOTE — Assessment & Plan Note (Signed)
Continue Plavix and 5 mg daily and Lipitor 40 mg daily.

## 2020-03-29 NOTE — Assessment & Plan Note (Signed)
We will check labs today including CBC, C met, TSH.  Slightly above goal today.  Will continue amlodipine 10 mg daily, lisinopril 20 mg daily, metoprolol tartrate 100 mg twice daily.  If continues to be elevated next follow-up will likely increase dose of lisinopril to 40 mg or increase dose of metoprolol.  He has not been taking his hydralazine.

## 2020-04-01 DIAGNOSIS — S72001D Fracture of unspecified part of neck of right femur, subsequent encounter for closed fracture with routine healing: Secondary | ICD-10-CM | POA: Diagnosis not present

## 2020-04-01 DIAGNOSIS — I82511 Chronic embolism and thrombosis of right femoral vein: Secondary | ICD-10-CM | POA: Diagnosis not present

## 2020-04-01 DIAGNOSIS — I129 Hypertensive chronic kidney disease with stage 1 through stage 4 chronic kidney disease, or unspecified chronic kidney disease: Secondary | ICD-10-CM | POA: Diagnosis not present

## 2020-04-01 DIAGNOSIS — I739 Peripheral vascular disease, unspecified: Secondary | ICD-10-CM | POA: Diagnosis not present

## 2020-04-01 DIAGNOSIS — F2089 Other schizophrenia: Secondary | ICD-10-CM | POA: Diagnosis not present

## 2020-04-01 DIAGNOSIS — L89612 Pressure ulcer of right heel, stage 2: Secondary | ICD-10-CM | POA: Diagnosis not present

## 2020-04-01 DIAGNOSIS — S82891D Other fracture of right lower leg, subsequent encounter for closed fracture with routine healing: Secondary | ICD-10-CM | POA: Diagnosis not present

## 2020-04-01 DIAGNOSIS — N183 Chronic kidney disease, stage 3 unspecified: Secondary | ICD-10-CM | POA: Diagnosis not present

## 2020-04-01 DIAGNOSIS — I69351 Hemiplegia and hemiparesis following cerebral infarction affecting right dominant side: Secondary | ICD-10-CM | POA: Diagnosis not present

## 2020-04-01 NOTE — Progress Notes (Signed)
Please inform patient of the following:  Blood work is all STABLE. We should continue with his current treatment plan and we can recheck blood work in a year or so.  Daniel [name]. [name], MD 04/01/2020 8:07 AM

## 2020-04-03 DIAGNOSIS — I69351 Hemiplegia and hemiparesis following cerebral infarction affecting right dominant side: Secondary | ICD-10-CM | POA: Diagnosis not present

## 2020-04-03 DIAGNOSIS — I739 Peripheral vascular disease, unspecified: Secondary | ICD-10-CM | POA: Diagnosis not present

## 2020-04-03 DIAGNOSIS — I129 Hypertensive chronic kidney disease with stage 1 through stage 4 chronic kidney disease, or unspecified chronic kidney disease: Secondary | ICD-10-CM | POA: Diagnosis not present

## 2020-04-03 DIAGNOSIS — I82511 Chronic embolism and thrombosis of right femoral vein: Secondary | ICD-10-CM | POA: Diagnosis not present

## 2020-04-03 DIAGNOSIS — S72001D Fracture of unspecified part of neck of right femur, subsequent encounter for closed fracture with routine healing: Secondary | ICD-10-CM | POA: Diagnosis not present

## 2020-04-03 DIAGNOSIS — L89612 Pressure ulcer of right heel, stage 2: Secondary | ICD-10-CM | POA: Diagnosis not present

## 2020-04-03 DIAGNOSIS — F2089 Other schizophrenia: Secondary | ICD-10-CM | POA: Diagnosis not present

## 2020-04-03 DIAGNOSIS — S82891D Other fracture of right lower leg, subsequent encounter for closed fracture with routine healing: Secondary | ICD-10-CM | POA: Diagnosis not present

## 2020-04-03 DIAGNOSIS — N183 Chronic kidney disease, stage 3 unspecified: Secondary | ICD-10-CM | POA: Diagnosis not present

## 2020-04-05 DIAGNOSIS — N183 Chronic kidney disease, stage 3 unspecified: Secondary | ICD-10-CM | POA: Diagnosis not present

## 2020-04-05 DIAGNOSIS — F2089 Other schizophrenia: Secondary | ICD-10-CM | POA: Diagnosis not present

## 2020-04-05 DIAGNOSIS — S82891D Other fracture of right lower leg, subsequent encounter for closed fracture with routine healing: Secondary | ICD-10-CM | POA: Diagnosis not present

## 2020-04-05 DIAGNOSIS — I129 Hypertensive chronic kidney disease with stage 1 through stage 4 chronic kidney disease, or unspecified chronic kidney disease: Secondary | ICD-10-CM | POA: Diagnosis not present

## 2020-04-05 DIAGNOSIS — I82511 Chronic embolism and thrombosis of right femoral vein: Secondary | ICD-10-CM | POA: Diagnosis not present

## 2020-04-05 DIAGNOSIS — I739 Peripheral vascular disease, unspecified: Secondary | ICD-10-CM | POA: Diagnosis not present

## 2020-04-05 DIAGNOSIS — I69351 Hemiplegia and hemiparesis following cerebral infarction affecting right dominant side: Secondary | ICD-10-CM | POA: Diagnosis not present

## 2020-04-05 DIAGNOSIS — L89612 Pressure ulcer of right heel, stage 2: Secondary | ICD-10-CM | POA: Diagnosis not present

## 2020-04-05 DIAGNOSIS — S72001D Fracture of unspecified part of neck of right femur, subsequent encounter for closed fracture with routine healing: Secondary | ICD-10-CM | POA: Diagnosis not present

## 2020-04-06 DIAGNOSIS — I82511 Chronic embolism and thrombosis of right femoral vein: Secondary | ICD-10-CM | POA: Diagnosis not present

## 2020-04-06 DIAGNOSIS — I129 Hypertensive chronic kidney disease with stage 1 through stage 4 chronic kidney disease, or unspecified chronic kidney disease: Secondary | ICD-10-CM | POA: Diagnosis not present

## 2020-04-06 DIAGNOSIS — S82891D Other fracture of right lower leg, subsequent encounter for closed fracture with routine healing: Secondary | ICD-10-CM | POA: Diagnosis not present

## 2020-04-06 DIAGNOSIS — S72001D Fracture of unspecified part of neck of right femur, subsequent encounter for closed fracture with routine healing: Secondary | ICD-10-CM | POA: Diagnosis not present

## 2020-04-06 DIAGNOSIS — N183 Chronic kidney disease, stage 3 unspecified: Secondary | ICD-10-CM | POA: Diagnosis not present

## 2020-04-06 DIAGNOSIS — I739 Peripheral vascular disease, unspecified: Secondary | ICD-10-CM | POA: Diagnosis not present

## 2020-04-06 DIAGNOSIS — I69351 Hemiplegia and hemiparesis following cerebral infarction affecting right dominant side: Secondary | ICD-10-CM | POA: Diagnosis not present

## 2020-04-06 DIAGNOSIS — L89612 Pressure ulcer of right heel, stage 2: Secondary | ICD-10-CM | POA: Diagnosis not present

## 2020-04-06 DIAGNOSIS — F2089 Other schizophrenia: Secondary | ICD-10-CM | POA: Diagnosis not present

## 2020-04-08 DIAGNOSIS — N183 Chronic kidney disease, stage 3 unspecified: Secondary | ICD-10-CM | POA: Diagnosis not present

## 2020-04-08 DIAGNOSIS — I69351 Hemiplegia and hemiparesis following cerebral infarction affecting right dominant side: Secondary | ICD-10-CM | POA: Diagnosis not present

## 2020-04-08 DIAGNOSIS — S72001D Fracture of unspecified part of neck of right femur, subsequent encounter for closed fracture with routine healing: Secondary | ICD-10-CM | POA: Diagnosis not present

## 2020-04-08 DIAGNOSIS — S82891D Other fracture of right lower leg, subsequent encounter for closed fracture with routine healing: Secondary | ICD-10-CM | POA: Diagnosis not present

## 2020-04-08 DIAGNOSIS — I129 Hypertensive chronic kidney disease with stage 1 through stage 4 chronic kidney disease, or unspecified chronic kidney disease: Secondary | ICD-10-CM | POA: Diagnosis not present

## 2020-04-08 DIAGNOSIS — F2089 Other schizophrenia: Secondary | ICD-10-CM | POA: Diagnosis not present

## 2020-04-08 DIAGNOSIS — I82511 Chronic embolism and thrombosis of right femoral vein: Secondary | ICD-10-CM | POA: Diagnosis not present

## 2020-04-08 DIAGNOSIS — I739 Peripheral vascular disease, unspecified: Secondary | ICD-10-CM | POA: Diagnosis not present

## 2020-04-08 DIAGNOSIS — L89612 Pressure ulcer of right heel, stage 2: Secondary | ICD-10-CM | POA: Diagnosis not present

## 2020-04-09 DIAGNOSIS — N183 Chronic kidney disease, stage 3 unspecified: Secondary | ICD-10-CM | POA: Diagnosis not present

## 2020-04-09 DIAGNOSIS — I82511 Chronic embolism and thrombosis of right femoral vein: Secondary | ICD-10-CM | POA: Diagnosis not present

## 2020-04-09 DIAGNOSIS — L89612 Pressure ulcer of right heel, stage 2: Secondary | ICD-10-CM | POA: Diagnosis not present

## 2020-04-09 DIAGNOSIS — S72001D Fracture of unspecified part of neck of right femur, subsequent encounter for closed fracture with routine healing: Secondary | ICD-10-CM | POA: Diagnosis not present

## 2020-04-09 DIAGNOSIS — S82891D Other fracture of right lower leg, subsequent encounter for closed fracture with routine healing: Secondary | ICD-10-CM | POA: Diagnosis not present

## 2020-04-09 DIAGNOSIS — I129 Hypertensive chronic kidney disease with stage 1 through stage 4 chronic kidney disease, or unspecified chronic kidney disease: Secondary | ICD-10-CM | POA: Diagnosis not present

## 2020-04-09 DIAGNOSIS — F2089 Other schizophrenia: Secondary | ICD-10-CM | POA: Diagnosis not present

## 2020-04-09 DIAGNOSIS — I69351 Hemiplegia and hemiparesis following cerebral infarction affecting right dominant side: Secondary | ICD-10-CM | POA: Diagnosis not present

## 2020-04-09 DIAGNOSIS — I739 Peripheral vascular disease, unspecified: Secondary | ICD-10-CM | POA: Diagnosis not present

## 2020-04-10 DIAGNOSIS — S72001D Fracture of unspecified part of neck of right femur, subsequent encounter for closed fracture with routine healing: Secondary | ICD-10-CM | POA: Diagnosis not present

## 2020-04-10 DIAGNOSIS — F2089 Other schizophrenia: Secondary | ICD-10-CM | POA: Diagnosis not present

## 2020-04-10 DIAGNOSIS — I82511 Chronic embolism and thrombosis of right femoral vein: Secondary | ICD-10-CM | POA: Diagnosis not present

## 2020-04-10 DIAGNOSIS — I69351 Hemiplegia and hemiparesis following cerebral infarction affecting right dominant side: Secondary | ICD-10-CM | POA: Diagnosis not present

## 2020-04-10 DIAGNOSIS — S82891D Other fracture of right lower leg, subsequent encounter for closed fracture with routine healing: Secondary | ICD-10-CM | POA: Diagnosis not present

## 2020-04-10 DIAGNOSIS — I739 Peripheral vascular disease, unspecified: Secondary | ICD-10-CM | POA: Diagnosis not present

## 2020-04-10 DIAGNOSIS — L89612 Pressure ulcer of right heel, stage 2: Secondary | ICD-10-CM | POA: Diagnosis not present

## 2020-04-10 DIAGNOSIS — I129 Hypertensive chronic kidney disease with stage 1 through stage 4 chronic kidney disease, or unspecified chronic kidney disease: Secondary | ICD-10-CM | POA: Diagnosis not present

## 2020-04-10 DIAGNOSIS — N183 Chronic kidney disease, stage 3 unspecified: Secondary | ICD-10-CM | POA: Diagnosis not present

## 2020-04-11 DIAGNOSIS — S72001D Fracture of unspecified part of neck of right femur, subsequent encounter for closed fracture with routine healing: Secondary | ICD-10-CM | POA: Diagnosis not present

## 2020-04-11 DIAGNOSIS — I739 Peripheral vascular disease, unspecified: Secondary | ICD-10-CM | POA: Diagnosis not present

## 2020-04-11 DIAGNOSIS — I129 Hypertensive chronic kidney disease with stage 1 through stage 4 chronic kidney disease, or unspecified chronic kidney disease: Secondary | ICD-10-CM | POA: Diagnosis not present

## 2020-04-11 DIAGNOSIS — N183 Chronic kidney disease, stage 3 unspecified: Secondary | ICD-10-CM | POA: Diagnosis not present

## 2020-04-11 DIAGNOSIS — F2089 Other schizophrenia: Secondary | ICD-10-CM | POA: Diagnosis not present

## 2020-04-11 DIAGNOSIS — L89612 Pressure ulcer of right heel, stage 2: Secondary | ICD-10-CM | POA: Diagnosis not present

## 2020-04-11 DIAGNOSIS — S82891D Other fracture of right lower leg, subsequent encounter for closed fracture with routine healing: Secondary | ICD-10-CM | POA: Diagnosis not present

## 2020-04-11 DIAGNOSIS — I82511 Chronic embolism and thrombosis of right femoral vein: Secondary | ICD-10-CM | POA: Diagnosis not present

## 2020-04-11 DIAGNOSIS — I69351 Hemiplegia and hemiparesis following cerebral infarction affecting right dominant side: Secondary | ICD-10-CM | POA: Diagnosis not present

## 2020-04-12 DIAGNOSIS — I739 Peripheral vascular disease, unspecified: Secondary | ICD-10-CM | POA: Diagnosis not present

## 2020-04-12 DIAGNOSIS — S72001D Fracture of unspecified part of neck of right femur, subsequent encounter for closed fracture with routine healing: Secondary | ICD-10-CM | POA: Diagnosis not present

## 2020-04-12 DIAGNOSIS — I129 Hypertensive chronic kidney disease with stage 1 through stage 4 chronic kidney disease, or unspecified chronic kidney disease: Secondary | ICD-10-CM | POA: Diagnosis not present

## 2020-04-12 DIAGNOSIS — S82891D Other fracture of right lower leg, subsequent encounter for closed fracture with routine healing: Secondary | ICD-10-CM | POA: Diagnosis not present

## 2020-04-12 DIAGNOSIS — L89612 Pressure ulcer of right heel, stage 2: Secondary | ICD-10-CM | POA: Diagnosis not present

## 2020-04-12 DIAGNOSIS — I82511 Chronic embolism and thrombosis of right femoral vein: Secondary | ICD-10-CM | POA: Diagnosis not present

## 2020-04-12 DIAGNOSIS — F2089 Other schizophrenia: Secondary | ICD-10-CM | POA: Diagnosis not present

## 2020-04-12 DIAGNOSIS — N183 Chronic kidney disease, stage 3 unspecified: Secondary | ICD-10-CM | POA: Diagnosis not present

## 2020-04-12 DIAGNOSIS — I69351 Hemiplegia and hemiparesis following cerebral infarction affecting right dominant side: Secondary | ICD-10-CM | POA: Diagnosis not present

## 2020-04-15 DIAGNOSIS — I69351 Hemiplegia and hemiparesis following cerebral infarction affecting right dominant side: Secondary | ICD-10-CM | POA: Diagnosis not present

## 2020-04-15 DIAGNOSIS — F2089 Other schizophrenia: Secondary | ICD-10-CM | POA: Diagnosis not present

## 2020-04-15 DIAGNOSIS — S72001D Fracture of unspecified part of neck of right femur, subsequent encounter for closed fracture with routine healing: Secondary | ICD-10-CM | POA: Diagnosis not present

## 2020-04-15 DIAGNOSIS — I739 Peripheral vascular disease, unspecified: Secondary | ICD-10-CM | POA: Diagnosis not present

## 2020-04-15 DIAGNOSIS — L89612 Pressure ulcer of right heel, stage 2: Secondary | ICD-10-CM | POA: Diagnosis not present

## 2020-04-15 DIAGNOSIS — I82511 Chronic embolism and thrombosis of right femoral vein: Secondary | ICD-10-CM | POA: Diagnosis not present

## 2020-04-15 DIAGNOSIS — N183 Chronic kidney disease, stage 3 unspecified: Secondary | ICD-10-CM | POA: Diagnosis not present

## 2020-04-15 DIAGNOSIS — I129 Hypertensive chronic kidney disease with stage 1 through stage 4 chronic kidney disease, or unspecified chronic kidney disease: Secondary | ICD-10-CM | POA: Diagnosis not present

## 2020-04-15 DIAGNOSIS — S82891D Other fracture of right lower leg, subsequent encounter for closed fracture with routine healing: Secondary | ICD-10-CM | POA: Diagnosis not present

## 2020-04-16 DIAGNOSIS — I739 Peripheral vascular disease, unspecified: Secondary | ICD-10-CM | POA: Diagnosis not present

## 2020-04-16 DIAGNOSIS — S72141A Displaced intertrochanteric fracture of right femur, initial encounter for closed fracture: Secondary | ICD-10-CM | POA: Diagnosis not present

## 2020-04-16 DIAGNOSIS — S72001D Fracture of unspecified part of neck of right femur, subsequent encounter for closed fracture with routine healing: Secondary | ICD-10-CM | POA: Diagnosis not present

## 2020-04-16 DIAGNOSIS — I82511 Chronic embolism and thrombosis of right femoral vein: Secondary | ICD-10-CM | POA: Diagnosis not present

## 2020-04-16 DIAGNOSIS — I129 Hypertensive chronic kidney disease with stage 1 through stage 4 chronic kidney disease, or unspecified chronic kidney disease: Secondary | ICD-10-CM | POA: Diagnosis not present

## 2020-04-16 DIAGNOSIS — N183 Chronic kidney disease, stage 3 unspecified: Secondary | ICD-10-CM | POA: Diagnosis not present

## 2020-04-16 DIAGNOSIS — L89612 Pressure ulcer of right heel, stage 2: Secondary | ICD-10-CM | POA: Diagnosis not present

## 2020-04-16 DIAGNOSIS — S82891D Other fracture of right lower leg, subsequent encounter for closed fracture with routine healing: Secondary | ICD-10-CM | POA: Diagnosis not present

## 2020-04-16 DIAGNOSIS — F2089 Other schizophrenia: Secondary | ICD-10-CM | POA: Diagnosis not present

## 2020-04-16 DIAGNOSIS — I69351 Hemiplegia and hemiparesis following cerebral infarction affecting right dominant side: Secondary | ICD-10-CM | POA: Diagnosis not present

## 2020-04-17 DIAGNOSIS — S82891D Other fracture of right lower leg, subsequent encounter for closed fracture with routine healing: Secondary | ICD-10-CM | POA: Diagnosis not present

## 2020-04-17 DIAGNOSIS — F2089 Other schizophrenia: Secondary | ICD-10-CM | POA: Diagnosis not present

## 2020-04-17 DIAGNOSIS — I82511 Chronic embolism and thrombosis of right femoral vein: Secondary | ICD-10-CM | POA: Diagnosis not present

## 2020-04-17 DIAGNOSIS — N183 Chronic kidney disease, stage 3 unspecified: Secondary | ICD-10-CM | POA: Diagnosis not present

## 2020-04-17 DIAGNOSIS — I129 Hypertensive chronic kidney disease with stage 1 through stage 4 chronic kidney disease, or unspecified chronic kidney disease: Secondary | ICD-10-CM | POA: Diagnosis not present

## 2020-04-17 DIAGNOSIS — S72001D Fracture of unspecified part of neck of right femur, subsequent encounter for closed fracture with routine healing: Secondary | ICD-10-CM | POA: Diagnosis not present

## 2020-04-17 DIAGNOSIS — L89612 Pressure ulcer of right heel, stage 2: Secondary | ICD-10-CM | POA: Diagnosis not present

## 2020-04-17 DIAGNOSIS — I739 Peripheral vascular disease, unspecified: Secondary | ICD-10-CM | POA: Diagnosis not present

## 2020-04-17 DIAGNOSIS — I69351 Hemiplegia and hemiparesis following cerebral infarction affecting right dominant side: Secondary | ICD-10-CM | POA: Diagnosis not present

## 2020-04-19 ENCOUNTER — Emergency Department (HOSPITAL_COMMUNITY)
Admission: EM | Admit: 2020-04-19 | Discharge: 2020-04-20 | Disposition: A | Discharge status: Home / Self Care | Payer: Medicare PPO | Attending: Emergency Medicine | Admitting: Emergency Medicine

## 2020-04-19 ENCOUNTER — Other Ambulatory Visit: Payer: Self-pay

## 2020-04-19 ENCOUNTER — Telehealth: Payer: Self-pay | Admitting: *Deleted

## 2020-04-19 ENCOUNTER — Encounter (HOSPITAL_COMMUNITY): Payer: Self-pay

## 2020-04-19 DIAGNOSIS — I739 Peripheral vascular disease, unspecified: Secondary | ICD-10-CM | POA: Diagnosis not present

## 2020-04-19 DIAGNOSIS — Z7901 Long term (current) use of anticoagulants: Secondary | ICD-10-CM | POA: Insufficient documentation

## 2020-04-19 DIAGNOSIS — S91301A Unspecified open wound, right foot, initial encounter: Secondary | ICD-10-CM | POA: Diagnosis not present

## 2020-04-19 DIAGNOSIS — L89619 Pressure ulcer of right heel, unspecified stage: Secondary | ICD-10-CM | POA: Diagnosis not present

## 2020-04-19 DIAGNOSIS — S82891D Other fracture of right lower leg, subsequent encounter for closed fracture with routine healing: Secondary | ICD-10-CM | POA: Diagnosis not present

## 2020-04-19 DIAGNOSIS — L89610 Pressure ulcer of right heel, unstageable: Secondary | ICD-10-CM | POA: Diagnosis not present

## 2020-04-19 DIAGNOSIS — R52 Pain, unspecified: Secondary | ICD-10-CM | POA: Diagnosis not present

## 2020-04-19 DIAGNOSIS — L089 Local infection of the skin and subcutaneous tissue, unspecified: Secondary | ICD-10-CM | POA: Diagnosis present

## 2020-04-19 DIAGNOSIS — F2089 Other schizophrenia: Secondary | ICD-10-CM | POA: Diagnosis not present

## 2020-04-19 DIAGNOSIS — Z79899 Other long term (current) drug therapy: Secondary | ICD-10-CM | POA: Insufficient documentation

## 2020-04-19 DIAGNOSIS — N183 Chronic kidney disease, stage 3 unspecified: Secondary | ICD-10-CM | POA: Diagnosis not present

## 2020-04-19 DIAGNOSIS — S72001D Fracture of unspecified part of neck of right femur, subsequent encounter for closed fracture with routine healing: Secondary | ICD-10-CM | POA: Diagnosis not present

## 2020-04-19 DIAGNOSIS — L989 Disorder of the skin and subcutaneous tissue, unspecified: Secondary | ICD-10-CM

## 2020-04-19 DIAGNOSIS — I129 Hypertensive chronic kidney disease with stage 1 through stage 4 chronic kidney disease, or unspecified chronic kidney disease: Secondary | ICD-10-CM | POA: Insufficient documentation

## 2020-04-19 DIAGNOSIS — I82511 Chronic embolism and thrombosis of right femoral vein: Secondary | ICD-10-CM | POA: Diagnosis not present

## 2020-04-19 DIAGNOSIS — I69351 Hemiplegia and hemiparesis following cerebral infarction affecting right dominant side: Secondary | ICD-10-CM | POA: Diagnosis not present

## 2020-04-19 DIAGNOSIS — L89612 Pressure ulcer of right heel, stage 2: Secondary | ICD-10-CM | POA: Diagnosis not present

## 2020-04-19 LAB — CBC WITH DIFFERENTIAL/PLATELET
Abs Immature Granulocytes: 0.01 K/uL (ref 0.00–0.07)
Basophils Absolute: 0 K/uL (ref 0.0–0.1)
Basophils Relative: 1 %
Eosinophils Absolute: 0.3 K/uL (ref 0.0–0.5)
Eosinophils Relative: 6 %
HCT: 36.3 % — ABNORMAL LOW (ref 39.0–52.0)
Hemoglobin: 11.4 g/dL — ABNORMAL LOW (ref 13.0–17.0)
Immature Granulocytes: 0 %
Lymphocytes Relative: 27 %
Lymphs Abs: 1.1 K/uL (ref 0.7–4.0)
MCH: 28.9 pg (ref 26.0–34.0)
MCHC: 31.4 g/dL (ref 30.0–36.0)
MCV: 92.1 fL (ref 80.0–100.0)
Monocytes Absolute: 0.6 K/uL (ref 0.1–1.0)
Monocytes Relative: 15 %
Neutro Abs: 2.1 K/uL (ref 1.7–7.7)
Neutrophils Relative %: 51 %
Platelets: 270 K/uL (ref 150–400)
RBC: 3.94 MIL/uL — ABNORMAL LOW (ref 4.22–5.81)
RDW: 13.8 % (ref 11.5–15.5)
WBC: 4.1 K/uL (ref 4.0–10.5)
nRBC: 0 % (ref 0.0–0.2)

## 2020-04-19 LAB — URINALYSIS, ROUTINE W REFLEX MICROSCOPIC
Bilirubin Urine: NEGATIVE
Glucose, UA: NEGATIVE mg/dL
Hgb urine dipstick: NEGATIVE
Ketones, ur: NEGATIVE mg/dL
Nitrite: NEGATIVE
Protein, ur: 30 mg/dL — AB
Specific Gravity, Urine: 1.012 (ref 1.005–1.030)
pH: 6 (ref 5.0–8.0)

## 2020-04-19 LAB — COMPREHENSIVE METABOLIC PANEL WITH GFR
ALT: 18 U/L (ref 0–44)
AST: 24 U/L (ref 15–41)
Albumin: 4.3 g/dL (ref 3.5–5.0)
Alkaline Phosphatase: 103 U/L (ref 38–126)
Anion gap: 10 (ref 5–15)
BUN: 27 mg/dL — ABNORMAL HIGH (ref 8–23)
CO2: 25 mmol/L (ref 22–32)
Calcium: 9.7 mg/dL (ref 8.9–10.3)
Chloride: 106 mmol/L (ref 98–111)
Creatinine, Ser: 2.22 mg/dL — ABNORMAL HIGH (ref 0.61–1.24)
GFR calc Af Amer: 33 mL/min — ABNORMAL LOW
GFR calc non Af Amer: 28 mL/min — ABNORMAL LOW
Glucose, Bld: 100 mg/dL — ABNORMAL HIGH (ref 70–99)
Potassium: 3.8 mmol/L (ref 3.5–5.1)
Sodium: 141 mmol/L (ref 135–145)
Total Bilirubin: 0.8 mg/dL (ref 0.3–1.2)
Total Protein: 8.4 g/dL — ABNORMAL HIGH (ref 6.5–8.1)

## 2020-04-19 LAB — LACTIC ACID, PLASMA: Lactic Acid, Venous: 1.8 mmol/L (ref 0.5–1.9)

## 2020-04-19 MED ORDER — SODIUM CHLORIDE 0.9% FLUSH: 3.0000 mL | Freq: Once | INTRAVENOUS | Status: DC

## 2020-04-19 NOTE — ED Triage Notes (Signed)
Pt bib [facility] for eval of wound on R foot. Pt reports 6/10. States he has had this wound for five years and has not been able to get it to heal. PCP told him to come to ED.

## 2020-04-19 NOTE — Telephone Encounter (Signed)
Noted. Agree with plan.  [name]. [name], MD 04/19/2020 2:15 PM

## 2020-04-19 NOTE — Telephone Encounter (Signed)
Home health Nurse Hinton Dyer Call [phone] stating Patient been getting Treatments for bilateral wounds on back of legs.  Seen Podiatrist Dr Mary Sella and recommended to go to Ortho ASAP or ER. Ortho not able to seen him till Monday pt will go to ER.

## 2020-04-20 ENCOUNTER — Emergency Department (HOSPITAL_COMMUNITY): Payer: Medicare PPO

## 2020-04-20 DIAGNOSIS — S91301A Unspecified open wound, right foot, initial encounter: Secondary | ICD-10-CM | POA: Diagnosis not present

## 2020-04-20 DIAGNOSIS — I10 Essential (primary) hypertension: Secondary | ICD-10-CM | POA: Diagnosis not present

## 2020-04-20 DIAGNOSIS — Z7401 Bed confinement status: Secondary | ICD-10-CM | POA: Diagnosis not present

## 2020-04-20 DIAGNOSIS — M2550 Pain in unspecified joint: Secondary | ICD-10-CM | POA: Diagnosis not present

## 2020-04-20 DIAGNOSIS — G8190 Hemiplegia, unspecified affecting unspecified side: Secondary | ICD-10-CM | POA: Diagnosis not present

## 2020-04-20 MED ORDER — DOXYCYCLINE HYCLATE 100 MG PO CAPS: 100.0000 mg | ORAL_CAPSULE | Freq: Two times a day (BID) | ORAL | 0 refills | Status: DC

## 2020-04-20 MED ORDER — DOXYCYCLINE HYCLATE 100 MG PO TABS
100.0000 mg | ORAL_TABLET | Freq: Once | ORAL | Status: AC
  Administered 2020-04-20: 100 mg via ORAL
  Filled 2020-04-20: qty 1

## 2020-04-20 MED ORDER — SODIUM CHLORIDE 0.9 % IV BOLUS: 1000.0000 mL | Freq: Once | INTRAVENOUS | Status: DC

## 2020-04-20 NOTE — ED Notes (Signed)
Pt verbalized understanding of d/c instructions, follow up care and s/s requiring return to ed. Pt had no further questions at this time. Pt transported home via [facility]

## 2020-04-20 NOTE — ED Notes (Signed)
[facility] called 

## 2020-04-20 NOTE — ED Provider Notes (Signed)
[facility] EMERGENCY DEPARTMENT Provider Note   CSN: [id number] Arrival date & time: 04/19/20  1413     History Chief Complaint  Patient presents with  . Wound Infection    Daniel [name] is a 74 y.o. male.  Patient with past medical history notable for prior stroke, hypertension, schizophrenia, and psychosis presents to the emergency department with a chief complaint of right foot wound.  He states that he has had a worsening wound on his right heel.  States that a "medical clerk" came to his house and told him to come to the emergency department for evaluation.  He denies being diabetic.  Denies any fevers or chills.  Denies taking any medication for his foot wound.  He states that it has been there for "quite some time."  The history is provided by the patient. No language interpreter was used.       Past Medical History:  Diagnosis Date  . Hypertension   . Psychosis ([place])   . Schizophrenia ([place]) 1974  . Stroke [facility]) 2010   Right Hemiparesis    Patient Active Problem List   Diagnosis Date Noted  . Closed displaced intertrochanteric fracture of right femur ([place]) 12/11/2019  . LFT elevation   . Nicotine dependence with current use 03/29/2018  . Poor dentition 01/17/2015  . Peripheral vascular disease ([place]) 01/18/2014  . Benign hypertension with CKD (chronic kidney disease) stage III 01/18/2014  . Hyperlipidemia 08/17/2011  . Hemiplegia, late effect of cerebrovascular disease ([place]) 10/17/2009  . Anemia 10/16/2008  . Substance abuse in remission ([place]) 10/16/2008  . Schizophrenia ([place]) 12/07/1972    Past Surgical History:  Procedure Laterality Date  . FEMUR IM NAIL Right 12/12/2019   Procedure: INTRAMEDULLARY (IM) NAIL FEMORAL;  Surgeon: Renette Butters, MD;  Location: [facility];  Service: Orthopedics;  Laterality: Right;       Family History  Problem Relation Age of Onset  . Hypertension Mother   . CVA Sister   . Breast cancer Sister   .  Prostate cancer Brother     Social History   Tobacco Use  . Smoking status: Current Every Day Smoker    Packs/day: 0.10    Types: Cigarettes  . Smokeless tobacco: Never Used  . Tobacco comment: thinking about.  Cutting back 2-3 cigs per day  Substance Use Topics  . Alcohol use: Not Currently    Alcohol/week: 0.0 standard drinks  . Drug use: Not Currently    Home Medications Prior to Admission medications   Medication Sig Start Date End Date Taking? Authorizing Provider  amLODipine (NORVASC) 10 MG tablet Take 1 tablet (10 mg total) by mouth daily. 03/29/20   Vivi Barrack, MD  atorvastatin (LIPITOR) 40 MG tablet Take 1 tablet (40 mg total) by mouth daily. 03/29/20   Vivi Barrack, MD  clopidogrel (PLAVIX) 75 MG tablet Take 1 tablet (75 mg total) by mouth daily. 03/29/20   Vivi Barrack, MD  folic acid (FOLVITE) 1 MG tablet Take 1 tablet (1 mg total) by mouth daily. 03/29/20   Vivi Barrack, MD  gabapentin (NEURONTIN) 100 MG capsule TAKE 1 CAPSULE BY MOUTH THREE TIMES A DAY 03/29/20   Vivi Barrack, MD  HYDROcodone-acetaminophen (NORCO) 5-325 MG tablet Take 1 tablet by mouth every 4 (four) hours as needed for severe pain. 12/12/19   Prudencio Burly III, PA-C  lisinopril (ZESTRIL) 20 MG tablet Take 1 tablet (20 mg total) by mouth daily. 03/29/20  Vivi Barrack, MD  metoprolol tartrate (LOPRESSOR) 100 MG tablet Take 1 tablet (100 mg total) by mouth 2 (two) times daily. 03/29/20   Vivi Barrack, MD  Multiple Vitamins-Minerals (MULTIVITAMIN WITH MINERALS) tablet Take 1 tablet by mouth daily. 08/17/11   Trish Fountain, MD  polyethylene glycol (MIRALAX / GLYCOLAX) 17 g packet Take 17 g by mouth daily as needed for mild constipation. 12/20/19   Aline August, MD  senna-docusate (SENOKOT-S) 8.6-50 MG tablet Take 1 tablet by mouth 2 (two) times daily. Patient taking differently: Take 1 tablet by mouth 2 (two) times daily as needed for mild constipation or moderate constipation.   12/20/19   Aline August, MD    Allergies    Patient has no known allergies.  Review of Systems   Review of Systems  All other systems reviewed and are negative.   Physical Exam Updated Vital Signs BP (!) 150/74   Pulse 80   Temp 98 F (36.7 C)   Resp 17   SpO2 98%   Physical Exam Vitals and nursing note reviewed.  Constitutional:      General: He is not in acute distress.    Appearance: He is well-developed. He is not ill-appearing.  HENT:     Head: Normocephalic and atraumatic.  Eyes:     Conjunctiva/sclera: Conjunctivae normal.  Cardiovascular:     Rate and Rhythm: Normal rate.  Pulmonary:     Effort: Pulmonary effort is normal. No respiratory distress.  Abdominal:     General: There is no distension.  Musculoskeletal:     Cervical back: Neck supple.     Comments: Moves all extremities  Skin:    General: Skin is warm and dry.     Comments: Decubitus ulcer to right heel as pictured  Neurological:     Mental Status: He is alert and oriented to person, place, and time.  Psychiatric:        Mood and Affect: Mood normal.        Behavior: Behavior normal.       ED Results / Procedures / Treatments   Labs (all labs ordered are listed, but only abnormal results are displayed) Labs Reviewed  COMPREHENSIVE METABOLIC PANEL - Abnormal; Notable for the following components:      Result Value   Glucose, Bld 100 (*)    BUN 27 (*)    Creatinine, Ser 2.22 (*)    Total Protein 8.4 (*)    GFR calc non Af Amer 28 (*)    GFR calc Af Amer 33 (*)    All other components within normal limits  CBC WITH DIFFERENTIAL/PLATELET - Abnormal; Notable for the following components:   RBC 3.94 (*)    Hemoglobin 11.4 (*)    HCT 36.3 (*)    All other components within normal limits  URINALYSIS, ROUTINE W REFLEX MICROSCOPIC - Abnormal; Notable for the following components:   APPearance HAZY (*)    Protein, ur 30 (*)    Leukocytes,Ua MODERATE (*)    Bacteria, UA FEW (*)    All  other components within normal limits  LACTIC ACID, PLASMA    EKG None  Radiology DG Foot 2 Views Right  Result Date: 04/20/2020 CLINICAL DATA:  RIGHT foot wound. EXAM: RIGHT FOOT - 2 VIEW COMPARISON:  None. FINDINGS: Diffuse osteopenia limits characterization of osseous detail, however, there is no fracture line or displaced fracture fragment identified. No destructive change to suggest osteomyelitis. Irregularity of the soft tissues overlying the  calcaneus, likely related to the given history of soft tissue wound. No soft tissue gas appreciated. IMPRESSION: 1. No osseous fracture or dislocation. No evidence of osteomyelitis. 2. Irregularity of the soft tissues overlying the calcaneus, compatible with the given history of soft tissue wound. No soft tissue gas seen. Electronically Signed   By: Franki Cabot M.D.   On: 04/20/2020 05:20    Procedures Procedures (including critical care time)  Medications Ordered in ED Medications  sodium chloride flush (NS) 0.9 % injection 3 mL (has no administration in time range)    ED Course  I have reviewed the triage vital signs and the nursing notes.  Pertinent labs & imaging results that were available during my care of the patient were reviewed by me and considered in my medical decision making (see chart for details).    MDM Rules/Calculators/A&P                      This patient complains of wound on right heel, this involves an extensive number of treatment options, and is a complaint that carries with it a high risk of complications and morbidity.  The differential diagnosis includes osteomyelitis, cellulitis, decubitus ulcer.  Pertinent Labs I ordered, reviewed, and interpreted labs, which included no leukocytosis, BMP is baseline for patient.  Lactate is normal.  Imaging Interpretation I ordered imaging studies which included right foot x-ray.  I independently visualized and interpreted the right foot films, which showed no evidence of  osteo.   Medications I ordered medication doxy for potential infection   Reassessments After the interventions stated above, I reevaluated the patient and found stable for discharge.  Case discussed with Dr. Sedonia Small, who agrees with plan.  Close follow-up with ortho and treatment with doxy.  Final Clinical Impression(s) / ED Diagnoses Final diagnoses:  Pressure injury of skin of right heel, unspecified injury stage    Rx / DC Orders ED Discharge Orders    None       Montine Circle, PA-C 04/20/20 0617    Maudie Flakes, MD 04/20/20 [phone]

## 2020-04-20 NOTE — Discharge Instructions (Addendum)
You X-ray and blood work show no evidence of deep/bone infection.  You may need additional imaging such as an MRI to confirm this, but at this time you are able to be released from the ER.  We have started you on antibiotics in case you have developing infection.  Please follow-up with your doctor and with the orthopedic group listed above.

## 2020-04-23 DIAGNOSIS — I739 Peripheral vascular disease, unspecified: Secondary | ICD-10-CM | POA: Diagnosis not present

## 2020-04-23 DIAGNOSIS — I82511 Chronic embolism and thrombosis of right femoral vein: Secondary | ICD-10-CM | POA: Diagnosis not present

## 2020-04-23 DIAGNOSIS — S82891D Other fracture of right lower leg, subsequent encounter for closed fracture with routine healing: Secondary | ICD-10-CM | POA: Diagnosis not present

## 2020-04-23 DIAGNOSIS — I129 Hypertensive chronic kidney disease with stage 1 through stage 4 chronic kidney disease, or unspecified chronic kidney disease: Secondary | ICD-10-CM | POA: Diagnosis not present

## 2020-04-23 DIAGNOSIS — S72001D Fracture of unspecified part of neck of right femur, subsequent encounter for closed fracture with routine healing: Secondary | ICD-10-CM | POA: Diagnosis not present

## 2020-04-23 DIAGNOSIS — I69351 Hemiplegia and hemiparesis following cerebral infarction affecting right dominant side: Secondary | ICD-10-CM | POA: Diagnosis not present

## 2020-04-23 DIAGNOSIS — L89612 Pressure ulcer of right heel, stage 2: Secondary | ICD-10-CM | POA: Diagnosis not present

## 2020-04-23 DIAGNOSIS — F2089 Other schizophrenia: Secondary | ICD-10-CM | POA: Diagnosis not present

## 2020-04-23 DIAGNOSIS — N183 Chronic kidney disease, stage 3 unspecified: Secondary | ICD-10-CM | POA: Diagnosis not present

## 2020-04-24 DIAGNOSIS — I69351 Hemiplegia and hemiparesis following cerebral infarction affecting right dominant side: Secondary | ICD-10-CM | POA: Diagnosis not present

## 2020-04-24 DIAGNOSIS — S82891D Other fracture of right lower leg, subsequent encounter for closed fracture with routine healing: Secondary | ICD-10-CM | POA: Diagnosis not present

## 2020-04-24 DIAGNOSIS — S72001D Fracture of unspecified part of neck of right femur, subsequent encounter for closed fracture with routine healing: Secondary | ICD-10-CM | POA: Diagnosis not present

## 2020-04-24 DIAGNOSIS — L89612 Pressure ulcer of right heel, stage 2: Secondary | ICD-10-CM | POA: Diagnosis not present

## 2020-04-24 DIAGNOSIS — N183 Chronic kidney disease, stage 3 unspecified: Secondary | ICD-10-CM | POA: Diagnosis not present

## 2020-04-24 DIAGNOSIS — I82511 Chronic embolism and thrombosis of right femoral vein: Secondary | ICD-10-CM | POA: Diagnosis not present

## 2020-04-24 DIAGNOSIS — I739 Peripheral vascular disease, unspecified: Secondary | ICD-10-CM | POA: Diagnosis not present

## 2020-04-24 DIAGNOSIS — F2089 Other schizophrenia: Secondary | ICD-10-CM | POA: Diagnosis not present

## 2020-04-24 DIAGNOSIS — I129 Hypertensive chronic kidney disease with stage 1 through stage 4 chronic kidney disease, or unspecified chronic kidney disease: Secondary | ICD-10-CM | POA: Diagnosis not present

## 2020-04-26 DIAGNOSIS — L97521 Non-pressure chronic ulcer of other part of left foot limited to breakdown of skin: Secondary | ICD-10-CM | POA: Diagnosis not present

## 2020-04-26 DIAGNOSIS — Z7401 Bed confinement status: Secondary | ICD-10-CM | POA: Diagnosis not present

## 2020-04-26 DIAGNOSIS — I739 Peripheral vascular disease, unspecified: Secondary | ICD-10-CM | POA: Diagnosis not present

## 2020-04-26 DIAGNOSIS — M7661 Achilles tendinitis, right leg: Secondary | ICD-10-CM | POA: Diagnosis not present

## 2020-04-28 DIAGNOSIS — L97809 Non-pressure chronic ulcer of other part of unspecified lower leg with unspecified severity: Secondary | ICD-10-CM | POA: Diagnosis not present

## 2020-04-29 ENCOUNTER — Ambulatory Visit: Payer: Medicare PPO | Admitting: Family Medicine

## 2020-04-30 ENCOUNTER — Ambulatory Visit (INDEPENDENT_AMBULATORY_CARE_PROVIDER_SITE_OTHER): Payer: Medicare PPO | Admitting: Family Medicine

## 2020-04-30 ENCOUNTER — Other Ambulatory Visit: Payer: Self-pay

## 2020-04-30 ENCOUNTER — Encounter: Payer: Self-pay | Admitting: Family Medicine

## 2020-04-30 VITALS — BP 110/70 | HR 57 | Temp 98.7°F | Ht 73.0 in | Wt 132.0 lb

## 2020-04-30 DIAGNOSIS — L89899 Pressure ulcer of other site, unspecified stage: Secondary | ICD-10-CM | POA: Diagnosis not present

## 2020-04-30 DIAGNOSIS — I129 Hypertensive chronic kidney disease with stage 1 through stage 4 chronic kidney disease, or unspecified chronic kidney disease: Secondary | ICD-10-CM | POA: Diagnosis not present

## 2020-04-30 DIAGNOSIS — L853 Xerosis cutis: Secondary | ICD-10-CM | POA: Diagnosis not present

## 2020-04-30 DIAGNOSIS — N183 Chronic kidney disease, stage 3 unspecified: Secondary | ICD-10-CM

## 2020-04-30 MED ORDER — SARNA 0.5-0.5 % EX LOTN: 1.0000 "application " | TOPICAL_LOTION | CUTANEOUS | 0 refills | Status: DC | PRN

## 2020-04-30 NOTE — Assessment & Plan Note (Signed)
Start topical sarna.

## 2020-04-30 NOTE — Progress Notes (Signed)
   Daniel [name] is a 74 y.o. male who presents today for an office visit.  Assessment/Plan:  New/Acute Problems: Pressure ulcer No red flags.  Will place referral to wound care for further management.  He also has home health doing home health wound care.  Chronic Problems Addressed Today: Benign hypertension with CKD (chronic kidney disease) stage III At goal.  Continue amlodipine 5 mg daily, lisinopril 20 mg daily, metoprolol tartrate 100 mg twice daily.  Xerosis cutis Start topical sarna.      Subjective:  HPI:  Patient presented to the ED on 04/19/2020 with right foot wound.  He was found to have pressure ulcer on his right heel.  X-ray was performed which showed no evidence of osteomyelitis.  He was started on doxycycline.  He has been doing well for the last couple of weeks.  Small amount of pain to the area.  He has not followed up with any healthcare specialist.  He has a home health care nurse that has been changing his wound.  No fevers or chills.  He has noticed increased itchy skin for the past several weeks to months.  Has not tried anything for this.       Objective:  Physical Exam: BP 110/70 (BP Location: Left Arm, Patient Position: Sitting, Cuff Size: Normal)   Pulse (!) 57   Temp 98.7 F (37.1 C) (Temporal)   Ht 6\' 1"  (1.854 m)   Wt 132 lb (59.9 kg)   SpO2 98%   BMI 17.42 kg/m   Gen: No acute distress, resting comfortably CV: Regular rate and rhythm with no murmurs appreciated Pulm: Normal work of breathing, clear to auscultation bilaterally with no crackles, wheezes, or rhonchi Skin: Dry skin noted diffusely throughout. Neuro: Grossly normal, moves all extremities Psych: Normal affect and thought content      Dreyden Rohrman M. [name], MD 04/30/2020 1:27 PM

## 2020-04-30 NOTE — Assessment & Plan Note (Signed)
At goal.  Continue amlodipine 5 mg daily, lisinopril 20 mg daily, metoprolol tartrate 100 mg twice daily.

## 2020-04-30 NOTE — Patient Instructions (Addendum)
It was very nice to see you today!  I will place a referral for you to see the wound care center.  Please use the Sarna lotion as needed to help control itching.  I will see you back in August for your next checkup.  Please come back to see me sooner if needed.  Take care, Dr [name]  Please try these tips to maintain a healthy lifestyle:   Eat at least 3 REAL meals and 1-2 snacks per day.  Aim for no more than 5 hours between eating.  If you eat breakfast, please do so within one hour of getting up.    Each meal should contain half fruits/vegetables, one quarter protein, and one quarter carbs (no bigger than a computer mouse)   Cut down on sweet beverages. This includes juice, soda, and sweet tea.     Drink at least 1 glass of water with each meal and aim for at least 8 glasses per day   Exercise at least 150 minutes every week.

## 2020-05-01 DIAGNOSIS — I82511 Chronic embolism and thrombosis of right femoral vein: Secondary | ICD-10-CM | POA: Diagnosis not present

## 2020-05-01 DIAGNOSIS — S72001D Fracture of unspecified part of neck of right femur, subsequent encounter for closed fracture with routine healing: Secondary | ICD-10-CM | POA: Diagnosis not present

## 2020-05-01 DIAGNOSIS — L89612 Pressure ulcer of right heel, stage 2: Secondary | ICD-10-CM | POA: Diagnosis not present

## 2020-05-01 DIAGNOSIS — I739 Peripheral vascular disease, unspecified: Secondary | ICD-10-CM | POA: Diagnosis not present

## 2020-05-01 DIAGNOSIS — I129 Hypertensive chronic kidney disease with stage 1 through stage 4 chronic kidney disease, or unspecified chronic kidney disease: Secondary | ICD-10-CM | POA: Diagnosis not present

## 2020-05-01 DIAGNOSIS — S82891D Other fracture of right lower leg, subsequent encounter for closed fracture with routine healing: Secondary | ICD-10-CM | POA: Diagnosis not present

## 2020-05-01 DIAGNOSIS — F2089 Other schizophrenia: Secondary | ICD-10-CM | POA: Diagnosis not present

## 2020-05-01 DIAGNOSIS — N183 Chronic kidney disease, stage 3 unspecified: Secondary | ICD-10-CM | POA: Diagnosis not present

## 2020-05-01 DIAGNOSIS — I69351 Hemiplegia and hemiparesis following cerebral infarction affecting right dominant side: Secondary | ICD-10-CM | POA: Diagnosis not present

## 2020-05-03 DIAGNOSIS — L89612 Pressure ulcer of right heel, stage 2: Secondary | ICD-10-CM | POA: Diagnosis not present

## 2020-05-03 DIAGNOSIS — I82511 Chronic embolism and thrombosis of right femoral vein: Secondary | ICD-10-CM | POA: Diagnosis not present

## 2020-05-03 DIAGNOSIS — I129 Hypertensive chronic kidney disease with stage 1 through stage 4 chronic kidney disease, or unspecified chronic kidney disease: Secondary | ICD-10-CM | POA: Diagnosis not present

## 2020-05-03 DIAGNOSIS — S82891D Other fracture of right lower leg, subsequent encounter for closed fracture with routine healing: Secondary | ICD-10-CM | POA: Diagnosis not present

## 2020-05-03 DIAGNOSIS — I69351 Hemiplegia and hemiparesis following cerebral infarction affecting right dominant side: Secondary | ICD-10-CM | POA: Diagnosis not present

## 2020-05-03 DIAGNOSIS — N183 Chronic kidney disease, stage 3 unspecified: Secondary | ICD-10-CM | POA: Diagnosis not present

## 2020-05-03 DIAGNOSIS — S72001D Fracture of unspecified part of neck of right femur, subsequent encounter for closed fracture with routine healing: Secondary | ICD-10-CM | POA: Diagnosis not present

## 2020-05-03 DIAGNOSIS — I739 Peripheral vascular disease, unspecified: Secondary | ICD-10-CM | POA: Diagnosis not present

## 2020-05-03 DIAGNOSIS — F2089 Other schizophrenia: Secondary | ICD-10-CM | POA: Diagnosis not present

## 2020-05-06 DIAGNOSIS — I69351 Hemiplegia and hemiparesis following cerebral infarction affecting right dominant side: Secondary | ICD-10-CM | POA: Diagnosis not present

## 2020-05-06 DIAGNOSIS — L89612 Pressure ulcer of right heel, stage 2: Secondary | ICD-10-CM | POA: Diagnosis not present

## 2020-05-06 DIAGNOSIS — F2089 Other schizophrenia: Secondary | ICD-10-CM | POA: Diagnosis not present

## 2020-05-06 DIAGNOSIS — N183 Chronic kidney disease, stage 3 unspecified: Secondary | ICD-10-CM | POA: Diagnosis not present

## 2020-05-06 DIAGNOSIS — I82511 Chronic embolism and thrombosis of right femoral vein: Secondary | ICD-10-CM | POA: Diagnosis not present

## 2020-05-06 DIAGNOSIS — I129 Hypertensive chronic kidney disease with stage 1 through stage 4 chronic kidney disease, or unspecified chronic kidney disease: Secondary | ICD-10-CM | POA: Diagnosis not present

## 2020-05-06 DIAGNOSIS — I739 Peripheral vascular disease, unspecified: Secondary | ICD-10-CM | POA: Diagnosis not present

## 2020-05-06 DIAGNOSIS — S82891D Other fracture of right lower leg, subsequent encounter for closed fracture with routine healing: Secondary | ICD-10-CM | POA: Diagnosis not present

## 2020-05-06 DIAGNOSIS — S72001D Fracture of unspecified part of neck of right femur, subsequent encounter for closed fracture with routine healing: Secondary | ICD-10-CM | POA: Diagnosis not present

## 2020-05-08 DIAGNOSIS — N183 Chronic kidney disease, stage 3 unspecified: Secondary | ICD-10-CM | POA: Diagnosis not present

## 2020-05-08 DIAGNOSIS — I69351 Hemiplegia and hemiparesis following cerebral infarction affecting right dominant side: Secondary | ICD-10-CM | POA: Diagnosis not present

## 2020-05-08 DIAGNOSIS — I82511 Chronic embolism and thrombosis of right femoral vein: Secondary | ICD-10-CM | POA: Diagnosis not present

## 2020-05-08 DIAGNOSIS — I129 Hypertensive chronic kidney disease with stage 1 through stage 4 chronic kidney disease, or unspecified chronic kidney disease: Secondary | ICD-10-CM | POA: Diagnosis not present

## 2020-05-08 DIAGNOSIS — L89612 Pressure ulcer of right heel, stage 2: Secondary | ICD-10-CM | POA: Diagnosis not present

## 2020-05-08 DIAGNOSIS — S82891D Other fracture of right lower leg, subsequent encounter for closed fracture with routine healing: Secondary | ICD-10-CM | POA: Diagnosis not present

## 2020-05-08 DIAGNOSIS — S72001D Fracture of unspecified part of neck of right femur, subsequent encounter for closed fracture with routine healing: Secondary | ICD-10-CM | POA: Diagnosis not present

## 2020-05-08 DIAGNOSIS — I739 Peripheral vascular disease, unspecified: Secondary | ICD-10-CM | POA: Diagnosis not present

## 2020-05-08 DIAGNOSIS — F2089 Other schizophrenia: Secondary | ICD-10-CM | POA: Diagnosis not present

## 2020-05-10 DIAGNOSIS — I69351 Hemiplegia and hemiparesis following cerebral infarction affecting right dominant side: Secondary | ICD-10-CM | POA: Diagnosis not present

## 2020-05-10 DIAGNOSIS — N183 Chronic kidney disease, stage 3 unspecified: Secondary | ICD-10-CM | POA: Diagnosis not present

## 2020-05-10 DIAGNOSIS — S72001D Fracture of unspecified part of neck of right femur, subsequent encounter for closed fracture with routine healing: Secondary | ICD-10-CM | POA: Diagnosis not present

## 2020-05-10 DIAGNOSIS — L89612 Pressure ulcer of right heel, stage 2: Secondary | ICD-10-CM | POA: Diagnosis not present

## 2020-05-10 DIAGNOSIS — F2089 Other schizophrenia: Secondary | ICD-10-CM | POA: Diagnosis not present

## 2020-05-10 DIAGNOSIS — I129 Hypertensive chronic kidney disease with stage 1 through stage 4 chronic kidney disease, or unspecified chronic kidney disease: Secondary | ICD-10-CM | POA: Diagnosis not present

## 2020-05-10 DIAGNOSIS — S82891D Other fracture of right lower leg, subsequent encounter for closed fracture with routine healing: Secondary | ICD-10-CM | POA: Diagnosis not present

## 2020-05-10 DIAGNOSIS — I82511 Chronic embolism and thrombosis of right femoral vein: Secondary | ICD-10-CM | POA: Diagnosis not present

## 2020-05-10 DIAGNOSIS — I739 Peripheral vascular disease, unspecified: Secondary | ICD-10-CM | POA: Diagnosis not present

## 2020-05-15 DIAGNOSIS — N183 Chronic kidney disease, stage 3 unspecified: Secondary | ICD-10-CM | POA: Diagnosis not present

## 2020-05-15 DIAGNOSIS — I69351 Hemiplegia and hemiparesis following cerebral infarction affecting right dominant side: Secondary | ICD-10-CM | POA: Diagnosis not present

## 2020-05-15 DIAGNOSIS — I82511 Chronic embolism and thrombosis of right femoral vein: Secondary | ICD-10-CM | POA: Diagnosis not present

## 2020-05-15 DIAGNOSIS — F2089 Other schizophrenia: Secondary | ICD-10-CM | POA: Diagnosis not present

## 2020-05-15 DIAGNOSIS — S72001D Fracture of unspecified part of neck of right femur, subsequent encounter for closed fracture with routine healing: Secondary | ICD-10-CM | POA: Diagnosis not present

## 2020-05-15 DIAGNOSIS — L89612 Pressure ulcer of right heel, stage 2: Secondary | ICD-10-CM | POA: Diagnosis not present

## 2020-05-15 DIAGNOSIS — S82891D Other fracture of right lower leg, subsequent encounter for closed fracture with routine healing: Secondary | ICD-10-CM | POA: Diagnosis not present

## 2020-05-15 DIAGNOSIS — I129 Hypertensive chronic kidney disease with stage 1 through stage 4 chronic kidney disease, or unspecified chronic kidney disease: Secondary | ICD-10-CM | POA: Diagnosis not present

## 2020-05-15 DIAGNOSIS — I739 Peripheral vascular disease, unspecified: Secondary | ICD-10-CM | POA: Diagnosis not present

## 2020-05-17 DIAGNOSIS — I129 Hypertensive chronic kidney disease with stage 1 through stage 4 chronic kidney disease, or unspecified chronic kidney disease: Secondary | ICD-10-CM | POA: Diagnosis not present

## 2020-05-17 DIAGNOSIS — L89612 Pressure ulcer of right heel, stage 2: Secondary | ICD-10-CM | POA: Diagnosis not present

## 2020-05-17 DIAGNOSIS — I69351 Hemiplegia and hemiparesis following cerebral infarction affecting right dominant side: Secondary | ICD-10-CM | POA: Diagnosis not present

## 2020-05-17 DIAGNOSIS — N183 Chronic kidney disease, stage 3 unspecified: Secondary | ICD-10-CM | POA: Diagnosis not present

## 2020-05-17 DIAGNOSIS — S72001D Fracture of unspecified part of neck of right femur, subsequent encounter for closed fracture with routine healing: Secondary | ICD-10-CM | POA: Diagnosis not present

## 2020-05-17 DIAGNOSIS — I82511 Chronic embolism and thrombosis of right femoral vein: Secondary | ICD-10-CM | POA: Diagnosis not present

## 2020-05-17 DIAGNOSIS — S82891D Other fracture of right lower leg, subsequent encounter for closed fracture with routine healing: Secondary | ICD-10-CM | POA: Diagnosis not present

## 2020-05-17 DIAGNOSIS — F2089 Other schizophrenia: Secondary | ICD-10-CM | POA: Diagnosis not present

## 2020-05-17 DIAGNOSIS — S72141A Displaced intertrochanteric fracture of right femur, initial encounter for closed fracture: Secondary | ICD-10-CM | POA: Diagnosis not present

## 2020-05-17 DIAGNOSIS — I739 Peripheral vascular disease, unspecified: Secondary | ICD-10-CM | POA: Diagnosis not present

## 2020-05-20 DIAGNOSIS — I129 Hypertensive chronic kidney disease with stage 1 through stage 4 chronic kidney disease, or unspecified chronic kidney disease: Secondary | ICD-10-CM | POA: Diagnosis not present

## 2020-05-20 DIAGNOSIS — I82511 Chronic embolism and thrombosis of right femoral vein: Secondary | ICD-10-CM | POA: Diagnosis not present

## 2020-05-20 DIAGNOSIS — L89612 Pressure ulcer of right heel, stage 2: Secondary | ICD-10-CM | POA: Diagnosis not present

## 2020-05-20 DIAGNOSIS — S82891D Other fracture of right lower leg, subsequent encounter for closed fracture with routine healing: Secondary | ICD-10-CM | POA: Diagnosis not present

## 2020-05-20 DIAGNOSIS — S72001D Fracture of unspecified part of neck of right femur, subsequent encounter for closed fracture with routine healing: Secondary | ICD-10-CM | POA: Diagnosis not present

## 2020-05-20 DIAGNOSIS — I69351 Hemiplegia and hemiparesis following cerebral infarction affecting right dominant side: Secondary | ICD-10-CM | POA: Diagnosis not present

## 2020-05-20 DIAGNOSIS — F2089 Other schizophrenia: Secondary | ICD-10-CM | POA: Diagnosis not present

## 2020-05-20 DIAGNOSIS — I739 Peripheral vascular disease, unspecified: Secondary | ICD-10-CM | POA: Diagnosis not present

## 2020-05-20 DIAGNOSIS — N183 Chronic kidney disease, stage 3 unspecified: Secondary | ICD-10-CM | POA: Diagnosis not present

## 2020-05-22 DIAGNOSIS — I739 Peripheral vascular disease, unspecified: Secondary | ICD-10-CM | POA: Diagnosis not present

## 2020-05-22 DIAGNOSIS — S72001D Fracture of unspecified part of neck of right femur, subsequent encounter for closed fracture with routine healing: Secondary | ICD-10-CM | POA: Diagnosis not present

## 2020-05-22 DIAGNOSIS — I69351 Hemiplegia and hemiparesis following cerebral infarction affecting right dominant side: Secondary | ICD-10-CM | POA: Diagnosis not present

## 2020-05-22 DIAGNOSIS — L89612 Pressure ulcer of right heel, stage 2: Secondary | ICD-10-CM | POA: Diagnosis not present

## 2020-05-22 DIAGNOSIS — F2089 Other schizophrenia: Secondary | ICD-10-CM | POA: Diagnosis not present

## 2020-05-22 DIAGNOSIS — I129 Hypertensive chronic kidney disease with stage 1 through stage 4 chronic kidney disease, or unspecified chronic kidney disease: Secondary | ICD-10-CM | POA: Diagnosis not present

## 2020-05-22 DIAGNOSIS — I82511 Chronic embolism and thrombosis of right femoral vein: Secondary | ICD-10-CM | POA: Diagnosis not present

## 2020-05-22 DIAGNOSIS — N183 Chronic kidney disease, stage 3 unspecified: Secondary | ICD-10-CM | POA: Diagnosis not present

## 2020-05-22 DIAGNOSIS — S82891D Other fracture of right lower leg, subsequent encounter for closed fracture with routine healing: Secondary | ICD-10-CM | POA: Diagnosis not present

## 2020-05-24 ENCOUNTER — Encounter (HOSPITAL_BASED_OUTPATIENT_CLINIC_OR_DEPARTMENT_OTHER): Payer: Medicare PPO | Admitting: Internal Medicine

## 2020-05-24 DIAGNOSIS — I82511 Chronic embolism and thrombosis of right femoral vein: Secondary | ICD-10-CM | POA: Diagnosis not present

## 2020-05-24 DIAGNOSIS — I69351 Hemiplegia and hemiparesis following cerebral infarction affecting right dominant side: Secondary | ICD-10-CM | POA: Diagnosis not present

## 2020-05-24 DIAGNOSIS — I739 Peripheral vascular disease, unspecified: Secondary | ICD-10-CM | POA: Diagnosis not present

## 2020-05-24 DIAGNOSIS — I129 Hypertensive chronic kidney disease with stage 1 through stage 4 chronic kidney disease, or unspecified chronic kidney disease: Secondary | ICD-10-CM | POA: Diagnosis not present

## 2020-05-24 DIAGNOSIS — S82891D Other fracture of right lower leg, subsequent encounter for closed fracture with routine healing: Secondary | ICD-10-CM | POA: Diagnosis not present

## 2020-05-24 DIAGNOSIS — F2089 Other schizophrenia: Secondary | ICD-10-CM | POA: Diagnosis not present

## 2020-05-24 DIAGNOSIS — L89612 Pressure ulcer of right heel, stage 2: Secondary | ICD-10-CM | POA: Diagnosis not present

## 2020-05-24 DIAGNOSIS — S72001D Fracture of unspecified part of neck of right femur, subsequent encounter for closed fracture with routine healing: Secondary | ICD-10-CM | POA: Diagnosis not present

## 2020-05-24 DIAGNOSIS — N183 Chronic kidney disease, stage 3 unspecified: Secondary | ICD-10-CM | POA: Diagnosis not present

## 2020-05-27 DIAGNOSIS — N183 Chronic kidney disease, stage 3 unspecified: Secondary | ICD-10-CM | POA: Diagnosis not present

## 2020-05-27 DIAGNOSIS — D631 Anemia in chronic kidney disease: Secondary | ICD-10-CM | POA: Diagnosis not present

## 2020-05-27 DIAGNOSIS — I739 Peripheral vascular disease, unspecified: Secondary | ICD-10-CM | POA: Diagnosis not present

## 2020-05-27 DIAGNOSIS — E785 Hyperlipidemia, unspecified: Secondary | ICD-10-CM | POA: Diagnosis not present

## 2020-05-27 DIAGNOSIS — I82511 Chronic embolism and thrombosis of right femoral vein: Secondary | ICD-10-CM | POA: Diagnosis not present

## 2020-05-27 DIAGNOSIS — I129 Hypertensive chronic kidney disease with stage 1 through stage 4 chronic kidney disease, or unspecified chronic kidney disease: Secondary | ICD-10-CM | POA: Diagnosis not present

## 2020-05-27 DIAGNOSIS — L89612 Pressure ulcer of right heel, stage 2: Secondary | ICD-10-CM | POA: Diagnosis not present

## 2020-05-27 DIAGNOSIS — F2089 Other schizophrenia: Secondary | ICD-10-CM | POA: Diagnosis not present

## 2020-05-27 DIAGNOSIS — I69351 Hemiplegia and hemiparesis following cerebral infarction affecting right dominant side: Secondary | ICD-10-CM | POA: Diagnosis not present

## 2020-05-28 DIAGNOSIS — B351 Tinea unguium: Secondary | ICD-10-CM | POA: Diagnosis not present

## 2020-05-28 DIAGNOSIS — M79675 Pain in left toe(s): Secondary | ICD-10-CM | POA: Diagnosis not present

## 2020-05-28 DIAGNOSIS — M7661 Achilles tendinitis, right leg: Secondary | ICD-10-CM | POA: Diagnosis not present

## 2020-05-28 DIAGNOSIS — L97521 Non-pressure chronic ulcer of other part of left foot limited to breakdown of skin: Secondary | ICD-10-CM | POA: Diagnosis not present

## 2020-05-28 DIAGNOSIS — I739 Peripheral vascular disease, unspecified: Secondary | ICD-10-CM | POA: Diagnosis not present

## 2020-05-28 DIAGNOSIS — Z7401 Bed confinement status: Secondary | ICD-10-CM | POA: Diagnosis not present

## 2020-05-29 DIAGNOSIS — L89612 Pressure ulcer of right heel, stage 2: Secondary | ICD-10-CM | POA: Diagnosis not present

## 2020-05-29 DIAGNOSIS — D631 Anemia in chronic kidney disease: Secondary | ICD-10-CM | POA: Diagnosis not present

## 2020-05-29 DIAGNOSIS — I82511 Chronic embolism and thrombosis of right femoral vein: Secondary | ICD-10-CM | POA: Diagnosis not present

## 2020-05-29 DIAGNOSIS — I129 Hypertensive chronic kidney disease with stage 1 through stage 4 chronic kidney disease, or unspecified chronic kidney disease: Secondary | ICD-10-CM | POA: Diagnosis not present

## 2020-05-29 DIAGNOSIS — N183 Chronic kidney disease, stage 3 unspecified: Secondary | ICD-10-CM | POA: Diagnosis not present

## 2020-05-29 DIAGNOSIS — F2089 Other schizophrenia: Secondary | ICD-10-CM | POA: Diagnosis not present

## 2020-05-29 DIAGNOSIS — I69351 Hemiplegia and hemiparesis following cerebral infarction affecting right dominant side: Secondary | ICD-10-CM | POA: Diagnosis not present

## 2020-05-29 DIAGNOSIS — I739 Peripheral vascular disease, unspecified: Secondary | ICD-10-CM | POA: Diagnosis not present

## 2020-05-29 DIAGNOSIS — E785 Hyperlipidemia, unspecified: Secondary | ICD-10-CM | POA: Diagnosis not present

## 2020-05-31 DIAGNOSIS — L89612 Pressure ulcer of right heel, stage 2: Secondary | ICD-10-CM | POA: Diagnosis not present

## 2020-05-31 DIAGNOSIS — D631 Anemia in chronic kidney disease: Secondary | ICD-10-CM | POA: Diagnosis not present

## 2020-05-31 DIAGNOSIS — I82511 Chronic embolism and thrombosis of right femoral vein: Secondary | ICD-10-CM | POA: Diagnosis not present

## 2020-05-31 DIAGNOSIS — I69351 Hemiplegia and hemiparesis following cerebral infarction affecting right dominant side: Secondary | ICD-10-CM | POA: Diagnosis not present

## 2020-05-31 DIAGNOSIS — E785 Hyperlipidemia, unspecified: Secondary | ICD-10-CM | POA: Diagnosis not present

## 2020-05-31 DIAGNOSIS — F2089 Other schizophrenia: Secondary | ICD-10-CM | POA: Diagnosis not present

## 2020-05-31 DIAGNOSIS — I739 Peripheral vascular disease, unspecified: Secondary | ICD-10-CM | POA: Diagnosis not present

## 2020-05-31 DIAGNOSIS — N183 Chronic kidney disease, stage 3 unspecified: Secondary | ICD-10-CM | POA: Diagnosis not present

## 2020-05-31 DIAGNOSIS — I129 Hypertensive chronic kidney disease with stage 1 through stage 4 chronic kidney disease, or unspecified chronic kidney disease: Secondary | ICD-10-CM | POA: Diagnosis not present

## 2020-06-03 DIAGNOSIS — I129 Hypertensive chronic kidney disease with stage 1 through stage 4 chronic kidney disease, or unspecified chronic kidney disease: Secondary | ICD-10-CM | POA: Diagnosis not present

## 2020-06-03 DIAGNOSIS — I69351 Hemiplegia and hemiparesis following cerebral infarction affecting right dominant side: Secondary | ICD-10-CM | POA: Diagnosis not present

## 2020-06-03 DIAGNOSIS — F2089 Other schizophrenia: Secondary | ICD-10-CM | POA: Diagnosis not present

## 2020-06-03 DIAGNOSIS — I82511 Chronic embolism and thrombosis of right femoral vein: Secondary | ICD-10-CM | POA: Diagnosis not present

## 2020-06-03 DIAGNOSIS — L89612 Pressure ulcer of right heel, stage 2: Secondary | ICD-10-CM | POA: Diagnosis not present

## 2020-06-03 DIAGNOSIS — N183 Chronic kidney disease, stage 3 unspecified: Secondary | ICD-10-CM | POA: Diagnosis not present

## 2020-06-03 DIAGNOSIS — I739 Peripheral vascular disease, unspecified: Secondary | ICD-10-CM | POA: Diagnosis not present

## 2020-06-03 DIAGNOSIS — D631 Anemia in chronic kidney disease: Secondary | ICD-10-CM | POA: Diagnosis not present

## 2020-06-03 DIAGNOSIS — E785 Hyperlipidemia, unspecified: Secondary | ICD-10-CM | POA: Diagnosis not present

## 2020-06-05 DIAGNOSIS — I739 Peripheral vascular disease, unspecified: Secondary | ICD-10-CM | POA: Diagnosis not present

## 2020-06-05 DIAGNOSIS — L89612 Pressure ulcer of right heel, stage 2: Secondary | ICD-10-CM | POA: Diagnosis not present

## 2020-06-05 DIAGNOSIS — D631 Anemia in chronic kidney disease: Secondary | ICD-10-CM | POA: Diagnosis not present

## 2020-06-05 DIAGNOSIS — E785 Hyperlipidemia, unspecified: Secondary | ICD-10-CM | POA: Diagnosis not present

## 2020-06-05 DIAGNOSIS — F2089 Other schizophrenia: Secondary | ICD-10-CM | POA: Diagnosis not present

## 2020-06-05 DIAGNOSIS — I129 Hypertensive chronic kidney disease with stage 1 through stage 4 chronic kidney disease, or unspecified chronic kidney disease: Secondary | ICD-10-CM | POA: Diagnosis not present

## 2020-06-05 DIAGNOSIS — I69351 Hemiplegia and hemiparesis following cerebral infarction affecting right dominant side: Secondary | ICD-10-CM | POA: Diagnosis not present

## 2020-06-05 DIAGNOSIS — N183 Chronic kidney disease, stage 3 unspecified: Secondary | ICD-10-CM | POA: Diagnosis not present

## 2020-06-05 DIAGNOSIS — I82511 Chronic embolism and thrombosis of right femoral vein: Secondary | ICD-10-CM | POA: Diagnosis not present

## 2020-06-07 DIAGNOSIS — I739 Peripheral vascular disease, unspecified: Secondary | ICD-10-CM | POA: Diagnosis not present

## 2020-06-07 DIAGNOSIS — I82511 Chronic embolism and thrombosis of right femoral vein: Secondary | ICD-10-CM | POA: Diagnosis not present

## 2020-06-07 DIAGNOSIS — N183 Chronic kidney disease, stage 3 unspecified: Secondary | ICD-10-CM | POA: Diagnosis not present

## 2020-06-07 DIAGNOSIS — E785 Hyperlipidemia, unspecified: Secondary | ICD-10-CM | POA: Diagnosis not present

## 2020-06-07 DIAGNOSIS — I129 Hypertensive chronic kidney disease with stage 1 through stage 4 chronic kidney disease, or unspecified chronic kidney disease: Secondary | ICD-10-CM | POA: Diagnosis not present

## 2020-06-07 DIAGNOSIS — F2089 Other schizophrenia: Secondary | ICD-10-CM | POA: Diagnosis not present

## 2020-06-07 DIAGNOSIS — L89612 Pressure ulcer of right heel, stage 2: Secondary | ICD-10-CM | POA: Diagnosis not present

## 2020-06-07 DIAGNOSIS — I69351 Hemiplegia and hemiparesis following cerebral infarction affecting right dominant side: Secondary | ICD-10-CM | POA: Diagnosis not present

## 2020-06-07 DIAGNOSIS — D631 Anemia in chronic kidney disease: Secondary | ICD-10-CM | POA: Diagnosis not present

## 2020-06-12 DIAGNOSIS — L89612 Pressure ulcer of right heel, stage 2: Secondary | ICD-10-CM | POA: Diagnosis not present

## 2020-06-12 DIAGNOSIS — I82511 Chronic embolism and thrombosis of right femoral vein: Secondary | ICD-10-CM | POA: Diagnosis not present

## 2020-06-12 DIAGNOSIS — I739 Peripheral vascular disease, unspecified: Secondary | ICD-10-CM | POA: Diagnosis not present

## 2020-06-12 DIAGNOSIS — E785 Hyperlipidemia, unspecified: Secondary | ICD-10-CM | POA: Diagnosis not present

## 2020-06-12 DIAGNOSIS — I129 Hypertensive chronic kidney disease with stage 1 through stage 4 chronic kidney disease, or unspecified chronic kidney disease: Secondary | ICD-10-CM | POA: Diagnosis not present

## 2020-06-12 DIAGNOSIS — D631 Anemia in chronic kidney disease: Secondary | ICD-10-CM | POA: Diagnosis not present

## 2020-06-12 DIAGNOSIS — F2089 Other schizophrenia: Secondary | ICD-10-CM | POA: Diagnosis not present

## 2020-06-12 DIAGNOSIS — N183 Chronic kidney disease, stage 3 unspecified: Secondary | ICD-10-CM | POA: Diagnosis not present

## 2020-06-12 DIAGNOSIS — I69351 Hemiplegia and hemiparesis following cerebral infarction affecting right dominant side: Secondary | ICD-10-CM | POA: Diagnosis not present

## 2020-06-14 DIAGNOSIS — I82511 Chronic embolism and thrombosis of right femoral vein: Secondary | ICD-10-CM | POA: Diagnosis not present

## 2020-06-14 DIAGNOSIS — F2089 Other schizophrenia: Secondary | ICD-10-CM | POA: Diagnosis not present

## 2020-06-14 DIAGNOSIS — I739 Peripheral vascular disease, unspecified: Secondary | ICD-10-CM | POA: Diagnosis not present

## 2020-06-14 DIAGNOSIS — D631 Anemia in chronic kidney disease: Secondary | ICD-10-CM | POA: Diagnosis not present

## 2020-06-14 DIAGNOSIS — I129 Hypertensive chronic kidney disease with stage 1 through stage 4 chronic kidney disease, or unspecified chronic kidney disease: Secondary | ICD-10-CM | POA: Diagnosis not present

## 2020-06-14 DIAGNOSIS — N183 Chronic kidney disease, stage 3 unspecified: Secondary | ICD-10-CM | POA: Diagnosis not present

## 2020-06-14 DIAGNOSIS — L89612 Pressure ulcer of right heel, stage 2: Secondary | ICD-10-CM | POA: Diagnosis not present

## 2020-06-14 DIAGNOSIS — E785 Hyperlipidemia, unspecified: Secondary | ICD-10-CM | POA: Diagnosis not present

## 2020-06-14 DIAGNOSIS — I69351 Hemiplegia and hemiparesis following cerebral infarction affecting right dominant side: Secondary | ICD-10-CM | POA: Diagnosis not present

## 2020-06-16 DIAGNOSIS — S72141A Displaced intertrochanteric fracture of right femur, initial encounter for closed fracture: Secondary | ICD-10-CM | POA: Diagnosis not present

## 2020-06-17 DIAGNOSIS — I129 Hypertensive chronic kidney disease with stage 1 through stage 4 chronic kidney disease, or unspecified chronic kidney disease: Secondary | ICD-10-CM | POA: Diagnosis not present

## 2020-06-17 DIAGNOSIS — D631 Anemia in chronic kidney disease: Secondary | ICD-10-CM | POA: Diagnosis not present

## 2020-06-17 DIAGNOSIS — F2089 Other schizophrenia: Secondary | ICD-10-CM | POA: Diagnosis not present

## 2020-06-17 DIAGNOSIS — I69351 Hemiplegia and hemiparesis following cerebral infarction affecting right dominant side: Secondary | ICD-10-CM | POA: Diagnosis not present

## 2020-06-17 DIAGNOSIS — E785 Hyperlipidemia, unspecified: Secondary | ICD-10-CM | POA: Diagnosis not present

## 2020-06-17 DIAGNOSIS — I739 Peripheral vascular disease, unspecified: Secondary | ICD-10-CM | POA: Diagnosis not present

## 2020-06-17 DIAGNOSIS — I82511 Chronic embolism and thrombosis of right femoral vein: Secondary | ICD-10-CM | POA: Diagnosis not present

## 2020-06-17 DIAGNOSIS — N183 Chronic kidney disease, stage 3 unspecified: Secondary | ICD-10-CM | POA: Diagnosis not present

## 2020-06-17 DIAGNOSIS — L89612 Pressure ulcer of right heel, stage 2: Secondary | ICD-10-CM | POA: Diagnosis not present

## 2020-06-19 DIAGNOSIS — D631 Anemia in chronic kidney disease: Secondary | ICD-10-CM | POA: Diagnosis not present

## 2020-06-19 DIAGNOSIS — L89612 Pressure ulcer of right heel, stage 2: Secondary | ICD-10-CM | POA: Diagnosis not present

## 2020-06-19 DIAGNOSIS — E785 Hyperlipidemia, unspecified: Secondary | ICD-10-CM | POA: Diagnosis not present

## 2020-06-19 DIAGNOSIS — I129 Hypertensive chronic kidney disease with stage 1 through stage 4 chronic kidney disease, or unspecified chronic kidney disease: Secondary | ICD-10-CM | POA: Diagnosis not present

## 2020-06-19 DIAGNOSIS — I69351 Hemiplegia and hemiparesis following cerebral infarction affecting right dominant side: Secondary | ICD-10-CM | POA: Diagnosis not present

## 2020-06-19 DIAGNOSIS — I739 Peripheral vascular disease, unspecified: Secondary | ICD-10-CM | POA: Diagnosis not present

## 2020-06-19 DIAGNOSIS — F2089 Other schizophrenia: Secondary | ICD-10-CM | POA: Diagnosis not present

## 2020-06-19 DIAGNOSIS — I82511 Chronic embolism and thrombosis of right femoral vein: Secondary | ICD-10-CM | POA: Diagnosis not present

## 2020-06-19 DIAGNOSIS — N183 Chronic kidney disease, stage 3 unspecified: Secondary | ICD-10-CM | POA: Diagnosis not present

## 2020-06-20 DIAGNOSIS — I739 Peripheral vascular disease, unspecified: Secondary | ICD-10-CM | POA: Diagnosis not present

## 2020-06-20 DIAGNOSIS — N183 Chronic kidney disease, stage 3 unspecified: Secondary | ICD-10-CM | POA: Diagnosis not present

## 2020-06-20 DIAGNOSIS — E785 Hyperlipidemia, unspecified: Secondary | ICD-10-CM | POA: Diagnosis not present

## 2020-06-20 DIAGNOSIS — I129 Hypertensive chronic kidney disease with stage 1 through stage 4 chronic kidney disease, or unspecified chronic kidney disease: Secondary | ICD-10-CM | POA: Diagnosis not present

## 2020-06-20 DIAGNOSIS — D631 Anemia in chronic kidney disease: Secondary | ICD-10-CM | POA: Diagnosis not present

## 2020-06-20 DIAGNOSIS — L89612 Pressure ulcer of right heel, stage 2: Secondary | ICD-10-CM | POA: Diagnosis not present

## 2020-06-20 DIAGNOSIS — I82511 Chronic embolism and thrombosis of right femoral vein: Secondary | ICD-10-CM | POA: Diagnosis not present

## 2020-06-20 DIAGNOSIS — F2089 Other schizophrenia: Secondary | ICD-10-CM | POA: Diagnosis not present

## 2020-06-20 DIAGNOSIS — I69351 Hemiplegia and hemiparesis following cerebral infarction affecting right dominant side: Secondary | ICD-10-CM | POA: Diagnosis not present

## 2020-06-21 DIAGNOSIS — E785 Hyperlipidemia, unspecified: Secondary | ICD-10-CM | POA: Diagnosis not present

## 2020-06-21 DIAGNOSIS — F2089 Other schizophrenia: Secondary | ICD-10-CM | POA: Diagnosis not present

## 2020-06-21 DIAGNOSIS — I69351 Hemiplegia and hemiparesis following cerebral infarction affecting right dominant side: Secondary | ICD-10-CM | POA: Diagnosis not present

## 2020-06-21 DIAGNOSIS — D631 Anemia in chronic kidney disease: Secondary | ICD-10-CM | POA: Diagnosis not present

## 2020-06-21 DIAGNOSIS — N183 Chronic kidney disease, stage 3 unspecified: Secondary | ICD-10-CM | POA: Diagnosis not present

## 2020-06-21 DIAGNOSIS — I129 Hypertensive chronic kidney disease with stage 1 through stage 4 chronic kidney disease, or unspecified chronic kidney disease: Secondary | ICD-10-CM | POA: Diagnosis not present

## 2020-06-21 DIAGNOSIS — L89612 Pressure ulcer of right heel, stage 2: Secondary | ICD-10-CM | POA: Diagnosis not present

## 2020-06-21 DIAGNOSIS — I82511 Chronic embolism and thrombosis of right femoral vein: Secondary | ICD-10-CM | POA: Diagnosis not present

## 2020-06-21 DIAGNOSIS — I739 Peripheral vascular disease, unspecified: Secondary | ICD-10-CM | POA: Diagnosis not present

## 2020-06-25 DIAGNOSIS — L89612 Pressure ulcer of right heel, stage 2: Secondary | ICD-10-CM | POA: Diagnosis not present

## 2020-06-25 DIAGNOSIS — I129 Hypertensive chronic kidney disease with stage 1 through stage 4 chronic kidney disease, or unspecified chronic kidney disease: Secondary | ICD-10-CM | POA: Diagnosis not present

## 2020-06-25 DIAGNOSIS — I739 Peripheral vascular disease, unspecified: Secondary | ICD-10-CM | POA: Diagnosis not present

## 2020-06-25 DIAGNOSIS — N183 Chronic kidney disease, stage 3 unspecified: Secondary | ICD-10-CM | POA: Diagnosis not present

## 2020-06-25 DIAGNOSIS — F2089 Other schizophrenia: Secondary | ICD-10-CM | POA: Diagnosis not present

## 2020-06-25 DIAGNOSIS — I82511 Chronic embolism and thrombosis of right femoral vein: Secondary | ICD-10-CM | POA: Diagnosis not present

## 2020-06-25 DIAGNOSIS — D631 Anemia in chronic kidney disease: Secondary | ICD-10-CM | POA: Diagnosis not present

## 2020-06-25 DIAGNOSIS — I69351 Hemiplegia and hemiparesis following cerebral infarction affecting right dominant side: Secondary | ICD-10-CM | POA: Diagnosis not present

## 2020-06-25 DIAGNOSIS — E785 Hyperlipidemia, unspecified: Secondary | ICD-10-CM | POA: Diagnosis not present

## 2020-06-28 DIAGNOSIS — I739 Peripheral vascular disease, unspecified: Secondary | ICD-10-CM | POA: Diagnosis not present

## 2020-06-28 DIAGNOSIS — D631 Anemia in chronic kidney disease: Secondary | ICD-10-CM | POA: Diagnosis not present

## 2020-06-28 DIAGNOSIS — I69351 Hemiplegia and hemiparesis following cerebral infarction affecting right dominant side: Secondary | ICD-10-CM | POA: Diagnosis not present

## 2020-06-28 DIAGNOSIS — L89612 Pressure ulcer of right heel, stage 2: Secondary | ICD-10-CM | POA: Diagnosis not present

## 2020-06-28 DIAGNOSIS — I129 Hypertensive chronic kidney disease with stage 1 through stage 4 chronic kidney disease, or unspecified chronic kidney disease: Secondary | ICD-10-CM | POA: Diagnosis not present

## 2020-06-28 DIAGNOSIS — I82511 Chronic embolism and thrombosis of right femoral vein: Secondary | ICD-10-CM | POA: Diagnosis not present

## 2020-06-28 DIAGNOSIS — E785 Hyperlipidemia, unspecified: Secondary | ICD-10-CM | POA: Diagnosis not present

## 2020-06-28 DIAGNOSIS — F2089 Other schizophrenia: Secondary | ICD-10-CM | POA: Diagnosis not present

## 2020-06-28 DIAGNOSIS — N183 Chronic kidney disease, stage 3 unspecified: Secondary | ICD-10-CM | POA: Diagnosis not present

## 2020-07-02 DIAGNOSIS — D631 Anemia in chronic kidney disease: Secondary | ICD-10-CM | POA: Diagnosis not present

## 2020-07-02 DIAGNOSIS — N183 Chronic kidney disease, stage 3 unspecified: Secondary | ICD-10-CM | POA: Diagnosis not present

## 2020-07-02 DIAGNOSIS — I69351 Hemiplegia and hemiparesis following cerebral infarction affecting right dominant side: Secondary | ICD-10-CM | POA: Diagnosis not present

## 2020-07-02 DIAGNOSIS — E785 Hyperlipidemia, unspecified: Secondary | ICD-10-CM | POA: Diagnosis not present

## 2020-07-02 DIAGNOSIS — F2089 Other schizophrenia: Secondary | ICD-10-CM | POA: Diagnosis not present

## 2020-07-02 DIAGNOSIS — L89612 Pressure ulcer of right heel, stage 2: Secondary | ICD-10-CM | POA: Diagnosis not present

## 2020-07-02 DIAGNOSIS — I129 Hypertensive chronic kidney disease with stage 1 through stage 4 chronic kidney disease, or unspecified chronic kidney disease: Secondary | ICD-10-CM | POA: Diagnosis not present

## 2020-07-02 DIAGNOSIS — I82511 Chronic embolism and thrombosis of right femoral vein: Secondary | ICD-10-CM | POA: Diagnosis not present

## 2020-07-02 DIAGNOSIS — I739 Peripheral vascular disease, unspecified: Secondary | ICD-10-CM | POA: Diagnosis not present

## 2020-07-04 DIAGNOSIS — F2089 Other schizophrenia: Secondary | ICD-10-CM | POA: Diagnosis not present

## 2020-07-04 DIAGNOSIS — I82511 Chronic embolism and thrombosis of right femoral vein: Secondary | ICD-10-CM | POA: Diagnosis not present

## 2020-07-04 DIAGNOSIS — N183 Chronic kidney disease, stage 3 unspecified: Secondary | ICD-10-CM | POA: Diagnosis not present

## 2020-07-04 DIAGNOSIS — I69351 Hemiplegia and hemiparesis following cerebral infarction affecting right dominant side: Secondary | ICD-10-CM | POA: Diagnosis not present

## 2020-07-04 DIAGNOSIS — D631 Anemia in chronic kidney disease: Secondary | ICD-10-CM | POA: Diagnosis not present

## 2020-07-04 DIAGNOSIS — E785 Hyperlipidemia, unspecified: Secondary | ICD-10-CM | POA: Diagnosis not present

## 2020-07-04 DIAGNOSIS — I129 Hypertensive chronic kidney disease with stage 1 through stage 4 chronic kidney disease, or unspecified chronic kidney disease: Secondary | ICD-10-CM | POA: Diagnosis not present

## 2020-07-04 DIAGNOSIS — I739 Peripheral vascular disease, unspecified: Secondary | ICD-10-CM | POA: Diagnosis not present

## 2020-07-04 DIAGNOSIS — L89612 Pressure ulcer of right heel, stage 2: Secondary | ICD-10-CM | POA: Diagnosis not present

## 2020-07-06 DIAGNOSIS — I739 Peripheral vascular disease, unspecified: Secondary | ICD-10-CM | POA: Diagnosis not present

## 2020-07-06 DIAGNOSIS — E785 Hyperlipidemia, unspecified: Secondary | ICD-10-CM | POA: Diagnosis not present

## 2020-07-06 DIAGNOSIS — D631 Anemia in chronic kidney disease: Secondary | ICD-10-CM | POA: Diagnosis not present

## 2020-07-06 DIAGNOSIS — I69351 Hemiplegia and hemiparesis following cerebral infarction affecting right dominant side: Secondary | ICD-10-CM | POA: Diagnosis not present

## 2020-07-06 DIAGNOSIS — F2089 Other schizophrenia: Secondary | ICD-10-CM | POA: Diagnosis not present

## 2020-07-06 DIAGNOSIS — I82511 Chronic embolism and thrombosis of right femoral vein: Secondary | ICD-10-CM | POA: Diagnosis not present

## 2020-07-06 DIAGNOSIS — I129 Hypertensive chronic kidney disease with stage 1 through stage 4 chronic kidney disease, or unspecified chronic kidney disease: Secondary | ICD-10-CM | POA: Diagnosis not present

## 2020-07-06 DIAGNOSIS — L89612 Pressure ulcer of right heel, stage 2: Secondary | ICD-10-CM | POA: Diagnosis not present

## 2020-07-06 DIAGNOSIS — N183 Chronic kidney disease, stage 3 unspecified: Secondary | ICD-10-CM | POA: Diagnosis not present

## 2020-07-10 DIAGNOSIS — I82511 Chronic embolism and thrombosis of right femoral vein: Secondary | ICD-10-CM | POA: Diagnosis not present

## 2020-07-10 DIAGNOSIS — D631 Anemia in chronic kidney disease: Secondary | ICD-10-CM | POA: Diagnosis not present

## 2020-07-10 DIAGNOSIS — N183 Chronic kidney disease, stage 3 unspecified: Secondary | ICD-10-CM | POA: Diagnosis not present

## 2020-07-10 DIAGNOSIS — E785 Hyperlipidemia, unspecified: Secondary | ICD-10-CM | POA: Diagnosis not present

## 2020-07-10 DIAGNOSIS — F2089 Other schizophrenia: Secondary | ICD-10-CM | POA: Diagnosis not present

## 2020-07-10 DIAGNOSIS — I69351 Hemiplegia and hemiparesis following cerebral infarction affecting right dominant side: Secondary | ICD-10-CM | POA: Diagnosis not present

## 2020-07-10 DIAGNOSIS — I739 Peripheral vascular disease, unspecified: Secondary | ICD-10-CM | POA: Diagnosis not present

## 2020-07-10 DIAGNOSIS — I129 Hypertensive chronic kidney disease with stage 1 through stage 4 chronic kidney disease, or unspecified chronic kidney disease: Secondary | ICD-10-CM | POA: Diagnosis not present

## 2020-07-10 DIAGNOSIS — L89612 Pressure ulcer of right heel, stage 2: Secondary | ICD-10-CM | POA: Diagnosis not present

## 2020-07-12 DIAGNOSIS — F2089 Other schizophrenia: Secondary | ICD-10-CM | POA: Diagnosis not present

## 2020-07-12 DIAGNOSIS — I69351 Hemiplegia and hemiparesis following cerebral infarction affecting right dominant side: Secondary | ICD-10-CM | POA: Diagnosis not present

## 2020-07-12 DIAGNOSIS — D631 Anemia in chronic kidney disease: Secondary | ICD-10-CM | POA: Diagnosis not present

## 2020-07-12 DIAGNOSIS — E785 Hyperlipidemia, unspecified: Secondary | ICD-10-CM | POA: Diagnosis not present

## 2020-07-12 DIAGNOSIS — I739 Peripheral vascular disease, unspecified: Secondary | ICD-10-CM | POA: Diagnosis not present

## 2020-07-12 DIAGNOSIS — I82511 Chronic embolism and thrombosis of right femoral vein: Secondary | ICD-10-CM | POA: Diagnosis not present

## 2020-07-12 DIAGNOSIS — L89612 Pressure ulcer of right heel, stage 2: Secondary | ICD-10-CM | POA: Diagnosis not present

## 2020-07-12 DIAGNOSIS — I129 Hypertensive chronic kidney disease with stage 1 through stage 4 chronic kidney disease, or unspecified chronic kidney disease: Secondary | ICD-10-CM | POA: Diagnosis not present

## 2020-07-12 DIAGNOSIS — N183 Chronic kidney disease, stage 3 unspecified: Secondary | ICD-10-CM | POA: Diagnosis not present

## 2020-07-15 DIAGNOSIS — I69351 Hemiplegia and hemiparesis following cerebral infarction affecting right dominant side: Secondary | ICD-10-CM | POA: Diagnosis not present

## 2020-07-15 DIAGNOSIS — I82511 Chronic embolism and thrombosis of right femoral vein: Secondary | ICD-10-CM | POA: Diagnosis not present

## 2020-07-15 DIAGNOSIS — L89612 Pressure ulcer of right heel, stage 2: Secondary | ICD-10-CM | POA: Diagnosis not present

## 2020-07-15 DIAGNOSIS — N183 Chronic kidney disease, stage 3 unspecified: Secondary | ICD-10-CM | POA: Diagnosis not present

## 2020-07-15 DIAGNOSIS — I739 Peripheral vascular disease, unspecified: Secondary | ICD-10-CM | POA: Diagnosis not present

## 2020-07-15 DIAGNOSIS — F2089 Other schizophrenia: Secondary | ICD-10-CM | POA: Diagnosis not present

## 2020-07-15 DIAGNOSIS — I129 Hypertensive chronic kidney disease with stage 1 through stage 4 chronic kidney disease, or unspecified chronic kidney disease: Secondary | ICD-10-CM | POA: Diagnosis not present

## 2020-07-15 DIAGNOSIS — D631 Anemia in chronic kidney disease: Secondary | ICD-10-CM | POA: Diagnosis not present

## 2020-07-15 DIAGNOSIS — E785 Hyperlipidemia, unspecified: Secondary | ICD-10-CM | POA: Diagnosis not present

## 2020-07-17 DIAGNOSIS — L89612 Pressure ulcer of right heel, stage 2: Secondary | ICD-10-CM | POA: Diagnosis not present

## 2020-07-17 DIAGNOSIS — I739 Peripheral vascular disease, unspecified: Secondary | ICD-10-CM | POA: Diagnosis not present

## 2020-07-17 DIAGNOSIS — I82511 Chronic embolism and thrombosis of right femoral vein: Secondary | ICD-10-CM | POA: Diagnosis not present

## 2020-07-17 DIAGNOSIS — S72141A Displaced intertrochanteric fracture of right femur, initial encounter for closed fracture: Secondary | ICD-10-CM | POA: Diagnosis not present

## 2020-07-17 DIAGNOSIS — I129 Hypertensive chronic kidney disease with stage 1 through stage 4 chronic kidney disease, or unspecified chronic kidney disease: Secondary | ICD-10-CM | POA: Diagnosis not present

## 2020-07-17 DIAGNOSIS — E785 Hyperlipidemia, unspecified: Secondary | ICD-10-CM | POA: Diagnosis not present

## 2020-07-17 DIAGNOSIS — I69351 Hemiplegia and hemiparesis following cerebral infarction affecting right dominant side: Secondary | ICD-10-CM | POA: Diagnosis not present

## 2020-07-17 DIAGNOSIS — F2089 Other schizophrenia: Secondary | ICD-10-CM | POA: Diagnosis not present

## 2020-07-17 DIAGNOSIS — D631 Anemia in chronic kidney disease: Secondary | ICD-10-CM | POA: Diagnosis not present

## 2020-07-17 DIAGNOSIS — N183 Chronic kidney disease, stage 3 unspecified: Secondary | ICD-10-CM | POA: Diagnosis not present

## 2020-07-19 DIAGNOSIS — N183 Chronic kidney disease, stage 3 unspecified: Secondary | ICD-10-CM | POA: Diagnosis not present

## 2020-07-19 DIAGNOSIS — F2089 Other schizophrenia: Secondary | ICD-10-CM | POA: Diagnosis not present

## 2020-07-19 DIAGNOSIS — E785 Hyperlipidemia, unspecified: Secondary | ICD-10-CM | POA: Diagnosis not present

## 2020-07-19 DIAGNOSIS — D631 Anemia in chronic kidney disease: Secondary | ICD-10-CM | POA: Diagnosis not present

## 2020-07-19 DIAGNOSIS — L89612 Pressure ulcer of right heel, stage 2: Secondary | ICD-10-CM | POA: Diagnosis not present

## 2020-07-19 DIAGNOSIS — I129 Hypertensive chronic kidney disease with stage 1 through stage 4 chronic kidney disease, or unspecified chronic kidney disease: Secondary | ICD-10-CM | POA: Diagnosis not present

## 2020-07-19 DIAGNOSIS — I739 Peripheral vascular disease, unspecified: Secondary | ICD-10-CM | POA: Diagnosis not present

## 2020-07-19 DIAGNOSIS — I69351 Hemiplegia and hemiparesis following cerebral infarction affecting right dominant side: Secondary | ICD-10-CM | POA: Diagnosis not present

## 2020-07-19 DIAGNOSIS — I82511 Chronic embolism and thrombosis of right femoral vein: Secondary | ICD-10-CM | POA: Diagnosis not present

## 2020-07-23 DIAGNOSIS — L89612 Pressure ulcer of right heel, stage 2: Secondary | ICD-10-CM | POA: Diagnosis not present

## 2020-07-23 DIAGNOSIS — I69351 Hemiplegia and hemiparesis following cerebral infarction affecting right dominant side: Secondary | ICD-10-CM | POA: Diagnosis not present

## 2020-07-23 DIAGNOSIS — N183 Chronic kidney disease, stage 3 unspecified: Secondary | ICD-10-CM | POA: Diagnosis not present

## 2020-07-23 DIAGNOSIS — I82511 Chronic embolism and thrombosis of right femoral vein: Secondary | ICD-10-CM | POA: Diagnosis not present

## 2020-07-23 DIAGNOSIS — F2089 Other schizophrenia: Secondary | ICD-10-CM | POA: Diagnosis not present

## 2020-07-23 DIAGNOSIS — E785 Hyperlipidemia, unspecified: Secondary | ICD-10-CM | POA: Diagnosis not present

## 2020-07-23 DIAGNOSIS — D631 Anemia in chronic kidney disease: Secondary | ICD-10-CM | POA: Diagnosis not present

## 2020-07-23 DIAGNOSIS — I129 Hypertensive chronic kidney disease with stage 1 through stage 4 chronic kidney disease, or unspecified chronic kidney disease: Secondary | ICD-10-CM | POA: Diagnosis not present

## 2020-07-23 DIAGNOSIS — I739 Peripheral vascular disease, unspecified: Secondary | ICD-10-CM | POA: Diagnosis not present

## 2020-07-24 DIAGNOSIS — I82511 Chronic embolism and thrombosis of right femoral vein: Secondary | ICD-10-CM | POA: Diagnosis not present

## 2020-07-24 DIAGNOSIS — N183 Chronic kidney disease, stage 3 unspecified: Secondary | ICD-10-CM | POA: Diagnosis not present

## 2020-07-24 DIAGNOSIS — I69351 Hemiplegia and hemiparesis following cerebral infarction affecting right dominant side: Secondary | ICD-10-CM | POA: Diagnosis not present

## 2020-07-24 DIAGNOSIS — I739 Peripheral vascular disease, unspecified: Secondary | ICD-10-CM | POA: Diagnosis not present

## 2020-07-24 DIAGNOSIS — I129 Hypertensive chronic kidney disease with stage 1 through stage 4 chronic kidney disease, or unspecified chronic kidney disease: Secondary | ICD-10-CM | POA: Diagnosis not present

## 2020-07-24 DIAGNOSIS — E785 Hyperlipidemia, unspecified: Secondary | ICD-10-CM | POA: Diagnosis not present

## 2020-07-24 DIAGNOSIS — F2089 Other schizophrenia: Secondary | ICD-10-CM | POA: Diagnosis not present

## 2020-07-24 DIAGNOSIS — D631 Anemia in chronic kidney disease: Secondary | ICD-10-CM | POA: Diagnosis not present

## 2020-07-24 DIAGNOSIS — L89612 Pressure ulcer of right heel, stage 2: Secondary | ICD-10-CM | POA: Diagnosis not present

## 2020-07-25 DIAGNOSIS — Z7401 Bed confinement status: Secondary | ICD-10-CM | POA: Diagnosis not present

## 2020-07-25 DIAGNOSIS — M7661 Achilles tendinitis, right leg: Secondary | ICD-10-CM | POA: Diagnosis not present

## 2020-07-25 DIAGNOSIS — L97521 Non-pressure chronic ulcer of other part of left foot limited to breakdown of skin: Secondary | ICD-10-CM | POA: Diagnosis not present

## 2020-07-25 DIAGNOSIS — B351 Tinea unguium: Secondary | ICD-10-CM | POA: Diagnosis not present

## 2020-07-25 DIAGNOSIS — M79675 Pain in left toe(s): Secondary | ICD-10-CM | POA: Diagnosis not present

## 2020-07-25 DIAGNOSIS — I739 Peripheral vascular disease, unspecified: Secondary | ICD-10-CM | POA: Diagnosis not present

## 2020-07-29 ENCOUNTER — Telehealth: Payer: Self-pay | Admitting: Hospice

## 2020-07-29 DIAGNOSIS — D631 Anemia in chronic kidney disease: Secondary | ICD-10-CM | POA: Diagnosis not present

## 2020-07-29 DIAGNOSIS — N183 Chronic kidney disease, stage 3 unspecified: Secondary | ICD-10-CM | POA: Diagnosis not present

## 2020-07-29 DIAGNOSIS — F2089 Other schizophrenia: Secondary | ICD-10-CM | POA: Diagnosis not present

## 2020-07-29 DIAGNOSIS — E785 Hyperlipidemia, unspecified: Secondary | ICD-10-CM | POA: Diagnosis not present

## 2020-07-29 DIAGNOSIS — L89612 Pressure ulcer of right heel, stage 2: Secondary | ICD-10-CM | POA: Diagnosis not present

## 2020-07-29 DIAGNOSIS — I739 Peripheral vascular disease, unspecified: Secondary | ICD-10-CM | POA: Diagnosis not present

## 2020-07-29 DIAGNOSIS — I69351 Hemiplegia and hemiparesis following cerebral infarction affecting right dominant side: Secondary | ICD-10-CM | POA: Diagnosis not present

## 2020-07-29 DIAGNOSIS — I82511 Chronic embolism and thrombosis of right femoral vein: Secondary | ICD-10-CM | POA: Diagnosis not present

## 2020-07-29 DIAGNOSIS — I129 Hypertensive chronic kidney disease with stage 1 through stage 4 chronic kidney disease, or unspecified chronic kidney disease: Secondary | ICD-10-CM | POA: Diagnosis not present

## 2020-07-29 NOTE — Telephone Encounter (Signed)
Scheduled [facility] Palliative visit for 08-21-20 at 12:30.

## 2020-07-30 ENCOUNTER — Ambulatory Visit: Payer: Medicare PPO | Admitting: Family Medicine

## 2020-07-31 ENCOUNTER — Encounter (HOSPITAL_BASED_OUTPATIENT_CLINIC_OR_DEPARTMENT_OTHER): Payer: Medicare PPO | Attending: Physician Assistant | Admitting: Physician Assistant

## 2020-07-31 DIAGNOSIS — I10 Essential (primary) hypertension: Secondary | ICD-10-CM | POA: Insufficient documentation

## 2020-07-31 DIAGNOSIS — I739 Peripheral vascular disease, unspecified: Secondary | ICD-10-CM | POA: Diagnosis not present

## 2020-07-31 DIAGNOSIS — Z823 Family history of stroke: Secondary | ICD-10-CM | POA: Insufficient documentation

## 2020-07-31 DIAGNOSIS — Z87891 Personal history of nicotine dependence: Secondary | ICD-10-CM | POA: Diagnosis not present

## 2020-07-31 DIAGNOSIS — L89613 Pressure ulcer of right heel, stage 3: Secondary | ICD-10-CM | POA: Diagnosis not present

## 2020-07-31 DIAGNOSIS — F209 Schizophrenia, unspecified: Secondary | ICD-10-CM | POA: Diagnosis not present

## 2020-07-31 DIAGNOSIS — Z8249 Family history of ischemic heart disease and other diseases of the circulatory system: Secondary | ICD-10-CM | POA: Diagnosis not present

## 2020-07-31 DIAGNOSIS — Z8673 Personal history of transient ischemic attack (TIA), and cerebral infarction without residual deficits: Secondary | ICD-10-CM | POA: Insufficient documentation

## 2020-07-31 DIAGNOSIS — L89893 Pressure ulcer of other site, stage 3: Secondary | ICD-10-CM | POA: Insufficient documentation

## 2020-08-01 NOTE — Progress Notes (Signed)
Daniel [name], Daniel [name] ([id number]) Visit Report for 07/31/2020 Abuse/Suicide Risk Screen Details Patient Name: Date of Service: Daniel [name], Daniel MES A. 07/31/2020 10:30 A M Medical Record Number: [id number] Patient Account Number: [id number] Date of Birth/Sex: Treating RN: 1946-02-22 (73 y.o. Male) Levan Hurst Primary Care Daniel [name]: Dimas Chyle Other Clinician: Referring Daniel [name]: Treating Daniel [name]/Extender: Daniel [name] Weeks in Treatment: 0 Abuse/Suicide Risk Screen Items Answer ABUSE RISK SCREEN: Has anyone close to you tried to hurt or harm you recentlyo No Do you feel uncomfortable with anyone in your familyo No Has anyone forced you do things that you didnt want to doo No Electronic Signature(s) Signed: 08/01/2020 4:47:33 PM By: Levan Hurst RN, BSN Entered By: Levan Hurst on 07/31/2020 11:31:04 -------------------------------------------------------------------------------- Activities of Daily Living Details Patient Name: Date of Service: Daniel [name], Daniel MES A. 07/31/2020 10:30 A M Medical Record Number: [id number] Patient Account Number: [id number] Date of Birth/Sex: Treating RN: [birth year] (73 y.o. Male) Levan Hurst Primary Care Daniel [name]: Dimas Chyle Other Clinician: Referring Daniel [name]: Treating Daniel [name]/Extender: Daniel [name] Weeks in Treatment: 0 Activities of Daily Living Items Answer Activities of Daily Living (Please select one for each item) Drive Automobile Not Able T Medications ake Completely Able Use T elephone Completely Able Care for Appearance Completely Able Use T oilet Completely Able Bath / Shower Need Assistance Dress Self Completely Able Feed Self Completely Able Walk Need Assistance Get In / Out Bed Need Assistance Housework Need Assistance Prepare Meals Need Assistance Handle Money Need Assistance Shop for Self Need Assistance Electronic Signature(s) Signed: 08/01/2020 4:47:33 PM By: Levan Hurst RN,  BSN Entered By: Levan Hurst on 07/31/2020 11:32:17 -------------------------------------------------------------------------------- Education Screening Details Patient Name: Date of Service: Daniel [name], [name] 07/31/2020 10:30 A M Medical Record Number: [id number] Patient Account Number: [id number] Date of Birth/Sex: Treating RN: January 23, 1946 (73 y.o. Male) Levan Hurst Primary Care Daniel [name]: Dimas Chyle Other Clinician: Referring Daniel [name]: Treating Daniel [name]/Extender: Daniel [name] in Treatment: 0 Primary Learner Assessed: Patient Learning Preferences/Education Level/Primary Language Learning Preference: Explanation, Demonstration, Printed Material Highest Education Level: High School Preferred Language: English Cognitive Barrier Language Barrier: No Translator Needed: No Memory Deficit: No Emotional Barrier: No Cultural/Religious Beliefs Affecting Medical Care: No Physical Barrier Impaired Vision: No Impaired Hearing: No Decreased Hand dexterity: No Knowledge/Comprehension Knowledge Level: High Comprehension Level: High Ability to understand written instructions: High Ability to understand verbal instructions: High Motivation Anxiety Level: Calm Cooperation: Cooperative Education Importance: Acknowledges Need Interest in Health Problems: Asks Questions Perception: Coherent Willingness to Engage in Self-Management High Activities: Readiness to Engage in Self-Management High Activities: Electronic Signature(s) Signed: 08/01/2020 4:47:33 PM By: Levan Hurst RN, BSN Entered By: Levan Hurst on 07/31/2020 11:32:35 -------------------------------------------------------------------------------- Fall Risk Assessment Details Patient Name: Date of Service: Daniel [name], Daniel MES A. 07/31/2020 10:30 A M Medical Record Number: [id number] Patient Account Number: [id number] Date of Birth/Sex: Treating RN: 1946-07-01 (74 y.o. Male) Levan Hurst Primary Care Daniel [name]: Dimas Chyle Other Clinician: Referring Daniel [name]: Treating Daniel [name]/Extender: Daniel [name] Weeks in Treatment: 0 Fall Risk Assessment Items Have you had 2 or more falls in the last 12 monthso 0 No Have you had any fall that resulted in injury in the last 12 monthso 0 No FALLS RISK SCREEN History of falling - immediate or within 3 months 0 No Secondary diagnosis (Do you have 2 or more medical diagnoseso) 15 Yes Ambulatory aid None/bed rest/wheelchair/nurse 0 Yes Crutches/cane/walker 0 No Furniture 0 No Intravenous therapy Access/Saline/Heparin Lock 0  No Gait/Transferring Normal/ bed rest/ wheelchair 0 No Weak (short steps with or without shuffle, stooped but able to lift head while walking, may seek 10 Yes support from furniture) Impaired (short steps with shuffle, may have difficulty arising from chair, head down, impaired 0 No balance) Mental Status Oriented to own ability 0 Yes Electronic Signature(s) Signed: 08/01/2020 4:47:33 PM By: Levan Hurst RN, BSN Entered By: Levan Hurst on 07/31/2020 11:33:21 -------------------------------------------------------------------------------- Foot Assessment Details Patient Name: Date of Service: Daniel [name], Daniel MES A. 07/31/2020 10:30 A M Medical Record Number: [id number] Patient Account Number: [id number] Date of Birth/Sex: Treating RN: 06-27-1946 (74 y.o. Male) Levan Hurst Primary Care Daniel [name]: Dimas Chyle Other Clinician: Referring Adalin Vanderploeg: Treating Daniel [name]/Extender: Daniel [name] Weeks in Treatment: 0 Foot Assessment Items Site Locations + = Sensation present, - = Sensation absent, C = Callus, U = Ulcer R = Redness, W = Warmth, M = Maceration, PU = Pre-ulcerative lesion F = Fissure, S = Swelling, D = Dryness Assessment Right: Left: Other Deformity: No No Prior Foot Ulcer: No No Prior Amputation: No No Charcot Joint: No No Ambulatory Status:  Ambulatory With Help Assistance Device: Wheelchair Gait: Administrator, arts) Signed: 08/01/2020 4:47:33 PM By: Levan Hurst RN, BSN Entered By: Levan Hurst on 07/31/2020 11:34:30 -------------------------------------------------------------------------------- Nutrition Risk Screening Details Patient Name: Date of Service: Daniel [name], Daniel MES A. 07/31/2020 10:30 A M Medical Record Number: [id number] Patient Account Number: [id number] Date of Birth/Sex: Treating RN: 08-Aug-1946 (73 y.o. Male) Levan Hurst Primary Care Sheala Dosh: Dimas Chyle Other Clinician: Referring Concepcion Kirkpatrick: Treating Teigen Parslow/Extender: Daniel [name] Weeks in Treatment: 0 Height (in): 69 Weight (lbs): 160 Body Mass Index (BMI): 23.6 Nutrition Risk Screening Items Score Screening NUTRITION RISK SCREEN: I have an illness or condition that made me change the kind and/or amount of food I eat 0 No I eat fewer than two meals per day 0 No I eat few fruits and vegetables, or milk products 0 No I have three or more drinks of beer, liquor or wine almost every day 0 No I have tooth or mouth problems that make it hard for me to eat 0 No I don't always have enough money to buy the food I need 0 No I eat alone most of the time 0 No I take three or more different prescribed or over-the-counter drugs a day 1 Yes Without wanting to, I have lost or gained 10 pounds in the last six months 0 No I am not always physically able to shop, cook and/or feed myself 2 Yes Nutrition Protocols Good Risk Protocol Moderate Risk Protocol 0 Provide education on nutrition High Risk Proctocol Risk Level: Moderate Risk Score: 3 Electronic Signature(s) Signed: 08/01/2020 4:47:33 PM By: Levan Hurst RN, BSN Entered By: Levan Hurst on 07/31/2020 11:33:29

## 2020-08-01 NOTE — Progress Notes (Signed)
ZION, TA ([id number]) Visit Report for 07/31/2020 Allergy List Details Patient Name: Date of Service: [name], [name] 07/31/2020 10:30 A M Medical Record Number: [id number] Patient Account Number: [id number] Date of Birth/Sex: Treating RN: 1946-07-26 (73 y.o. Male) Levan Hurst Primary Care Ebenezer Mccaskey: Dimas Chyle Other Clinician: Referring Sehaj Mcenroe: Treating Liahna Brickner/Extender: Welford Roche Weeks in Treatment: 0 Allergies Active Allergies No Known Drug Allergies Allergy Notes Electronic Signature(s) Signed: 08/01/2020 4:47:33 PM By: Levan Hurst RN, BSN Entered By: Levan Hurst on 07/31/2020 11:14:56 -------------------------------------------------------------------------------- Arrival Information Details Patient Name: Date of Service: [name], [place] MES A. 07/31/2020 10:30 A M Medical Record Number: [id number] Patient Account Number: [id number] Date of Birth/Sex: Treating RN: 1946/08/07 (73 y.o. Male) Levan Hurst Primary Care Josealfredo Adkins: Dimas Chyle Other Clinician: Referring Mina Babula: Treating Rishabh Rinkenberger/Extender: Lebron Quam in Treatment: 0 Visit Information Patient Arrived: Wheel Chair Arrival Time: 11:11 Accompanied By: alone Transfer Assistance: None Patient Identification Verified: Yes Secondary Verification Process Completed: Yes Patient Requires Transmission-Based Precautions: No Patient Has Alerts: Yes Patient Alerts: Patient on Blood Thinner Electronic Signature(s) Signed: 08/01/2020 4:47:33 PM By: Levan Hurst RN, BSN Entered By: Levan Hurst on 07/31/2020 11:14:16 -------------------------------------------------------------------------------- Clinic Level of Care Assessment Details Patient Name: Date of Service: [name], [name] 07/31/2020 10:30 A M Medical Record Number: [id number] Patient Account Number: [id number] Date of Birth/Sex: Treating RN: December 27, 1945 (73 y.o. Male) Baruch Gouty Primary  Care Rannie Craney: Dimas Chyle Other Clinician: Referring Kejon Feild: Treating Zeus Marquis/Extender: Welford Roche Weeks in Treatment: 0 Clinic Level of Care Assessment Items TOOL 2 Quantity Score []  - 0 Use when only an EandM is performed on the INITIAL visit ASSESSMENTS - Nursing Assessment / Reassessment X- 1 20 General Physical Exam (combine w/ comprehensive assessment (listed just below) when performed on new pt. evals) X- 1 25 Comprehensive Assessment (HX, ROS, Risk Assessments, Wounds Hx, etc.) ASSESSMENTS - Wound and Skin A ssessment / Reassessment []  - 0 Simple Wound Assessment / Reassessment - one wound X- 2 5 Complex Wound Assessment / Reassessment - multiple wounds []  - 0 Dermatologic / Skin Assessment (not related to wound area) ASSESSMENTS - Ostomy and/or Continence Assessment and Care []  - 0 Incontinence Assessment and Management []  - 0 Ostomy Care Assessment and Management (repouching, etc.) PROCESS - Coordination of Care X - Simple Patient / Family Education for ongoing care 1 15 []  - 0 Complex (extensive) Patient / Family Education for ongoing care X- 1 10 Staff obtains Programmer, systems, Records, T Results / Process Orders est X- 1 10 Staff telephones HHA, Nursing Homes / Clarify orders / etc []  - 0 Routine Transfer to another Facility (non-emergent condition) []  - 0 Routine Hospital Admission (non-emergent condition) X- 1 15 New Admissions / Biomedical engineer / Ordering NPWT Apligraf, etc. , []  - 0 Emergency Hospital Admission (emergent condition) X- 1 10 Simple Discharge Coordination []  - 0 Complex (extensive) Discharge Coordination PROCESS - Special Needs []  - 0 Pediatric / Minor Patient Management []  - 0 Isolation Patient Management []  - 0 Hearing / Language / Visual special needs []  - 0 Assessment of Community assistance (transportation, D/C planning, etc.) []  - 0 Additional assistance / Altered mentation []  - 0 Support  Surface(s) Assessment (bed, cushion, seat, etc.) INTERVENTIONS - Wound Cleansing / Measurement X- 1 5 Wound Imaging (photographs - any number of wounds) []  - 0 Wound Tracing (instead of photographs) []  - 0 Simple Wound Measurement - one wound X- 2 5 Complex Wound  Measurement - multiple wounds []  - 0 Simple Wound Cleansing - one wound X- 2 5 Complex Wound Cleansing - multiple wounds INTERVENTIONS - Wound Dressings X - Small Wound Dressing one or multiple wounds 2 10 []  - 0 Medium Wound Dressing one or multiple wounds []  - 0 Large Wound Dressing one or multiple wounds []  - 0 Application of Medications - injection INTERVENTIONS - Miscellaneous []  - 0 External ear exam []  - 0 Specimen Collection (cultures, biopsies, blood, body fluids, etc.) []  - 0 Specimen(s) / Culture(s) sent or taken to Lab for analysis []  - 0 Patient Transfer (multiple staff / Harrel Lemon Lift / Similar devices) []  - 0 Simple Staple / Suture removal (25 or less) []  - 0 Complex Staple / Suture removal (26 or more) []  - 0 Hypo / Hyperglycemic Management (close monitor of Blood Glucose) X- 1 15 Ankle / Brachial Index (ABI) - do not check if billed separately Has the patient been seen at the hospital within the last three years: Yes Total Score: 175 Level Of Care: New/Established - Level 5 Electronic Signature(s) Signed: 07/31/2020 6:40:41 PM By: Baruch Gouty RN, BSN Entered By: Baruch Gouty on 07/31/2020 12:15:59 -------------------------------------------------------------------------------- Encounter Discharge Information Details Patient Name: Date of Service: [name], [name] 07/31/2020 10:30 A M Medical Record Number: [id number] Patient Account Number: [id number] Date of Birth/Sex: Treating RN: 26-Dec-1945 (74 y.o. Male) Kela Millin Primary Care Magdelena Kinsella: Dimas Chyle Other Clinician: Referring Stephane Junkins: Treating Jordin Vicencio/Extender: Welford Roche Weeks in Treatment:  0 Encounter Discharge Information Items Discharge Condition: Stable Ambulatory Status: Wheelchair Discharge Destination: Home Transportation: Other Accompanied By: self Schedule Follow-up Appointment: Yes Clinical Summary of Care: Patient Declined Electronic Signature(s) Signed: 07/31/2020 2:46:37 PM By: Kela Millin Entered By: Kela Millin on 07/31/2020 12:37:38 -------------------------------------------------------------------------------- Lower Extremity Assessment Details Patient Name: Date of Service: [name], [name] 07/31/2020 10:30 A M Medical Record Number: [id number] Patient Account Number: [id number] Date of Birth/Sex: Treating RN: [birth year] (73 y.o. Male) Levan Hurst Primary Care Rajni Holsworth: Dimas Chyle Other Clinician: Referring Lisia Westbay: Treating Edwinna Rochette/Extender: Welford Roche Weeks in Treatment: 0 Edema Assessment Assessed: [Left: No] [Right: No] Edema: [Left: Ye] [Right: s] Calf Left: Right: Point of Measurement: 31 cm From Medial Instep cm 32 cm Ankle Left: Right: Point of Measurement: 11 cm From Medial Instep cm 24.5 cm Vascular Assessment Blood Pressure: Brachial: [Right:160] Ankle: [Right:Dorsalis Pedis: 110 0.69] Electronic Signature(s) Signed: 08/01/2020 4:47:33 PM By: Levan Hurst RN, BSN Entered By: Levan Hurst on 07/31/2020 11:39:48 -------------------------------------------------------------------------------- [place] Details Patient Name: Date of Service: [name], [name] 07/31/2020 10:30 A M Medical Record Number: [id number] Patient Account Number: [id number] Date of Birth/Sex: Treating RN: 08/13/1946 (73 y.o. Male) Baruch Gouty Primary Care Siearra Amberg: Dimas Chyle Other Clinician: Referring Jaedah Lords: Treating Stephen Turnbaugh/Extender: Welford Roche Weeks in Treatment: 0 Active Inactive Abuse / Safety / Falls / Self Care Management Nursing Diagnoses: Potential  for falls Goals: Patient/caregiver will verbalize/demonstrate measures taken to prevent injury and/or falls Date Initiated: 07/31/2020 Target Resolution Date: 08/28/2020 Goal Status: Active Interventions: Assess fall risk on admission and as needed Assess impairment of mobility on admission and as needed per policy Notes: Pressure Nursing Diagnoses: Knowledge deficit related to management of pressures ulcers Goals: Patient/caregiver will verbalize understanding of pressure ulcer management Date Initiated: 07/31/2020 Target Resolution Date: 08/28/2020 Goal Status: Active Interventions: Assess: immobility, friction, shearing, incontinence upon admission and as needed Assess offloading mechanisms upon admission and as needed Notes: Wound/Skin  Impairment Nursing Diagnoses: Impaired tissue integrity Knowledge deficit related to ulceration/compromised skin integrity Goals: Patient/caregiver will verbalize understanding of skin care regimen Date Initiated: 07/31/2020 Target Resolution Date: 08/28/2020 Goal Status: Active Ulcer/skin breakdown will have a volume reduction of 30% by week 4 Date Initiated: 07/31/2020 Target Resolution Date: 08/28/2020 Goal Status: Active Interventions: Assess patient/caregiver ability to obtain necessary supplies Assess patient/caregiver ability to perform ulcer/skin care regimen upon admission and as needed Assess ulceration(s) every visit Provide education on ulcer and skin care Treatment Activities: Skin care regimen initiated : 07/31/2020 Topical wound management initiated : 07/31/2020 Notes: Electronic Signature(s) Signed: 07/31/2020 6:40:41 PM By: Baruch Gouty RN, BSN Entered By: Baruch Gouty on 07/31/2020 12:03:43 -------------------------------------------------------------------------------- Pain Assessment Details Patient Name: Date of Service: [name], [name] 07/31/2020 10:30 A M Medical Record Number: [id number] Patient Account  Number: [id number] Date of Birth/Sex: Treating RN: 1946-09-09 (73 y.o. Male) Levan Hurst Primary Care Gigi Onstad: Dimas Chyle Other Clinician: Referring Vontrell Pullman: Treating Kienna Moncada/Extender: Welford Roche Weeks in Treatment: 0 Active Problems Location of Pain Severity and Description of Pain Patient Has Paino No Site Locations Pain Management and Medication Current Pain Management: Electronic Signature(s) Signed: 08/01/2020 4:47:33 PM By: Levan Hurst RN, BSN Entered By: Levan Hurst on 07/31/2020 11:44:05 -------------------------------------------------------------------------------- Patient/Caregiver Education Details Patient Name: Date of Service: Alvie [place] MES A. 8/25/2021andnbsp10:30 A M Medical Record Number: [id number] Patient Account Number: [id number] Date of Birth/Gender: Treating RN: Jul 15, 1946 (73 y.o. Male) Baruch Gouty Primary Care Physician: Dimas Chyle Other Clinician: Referring Physician: Treating Physician/Extender: Lebron Quam in Treatment: 0 Education Assessment Education Provided To: Patient Education Topics Provided [place]: o Handouts: Welcome T [employer] o Methods: Explain/Verbal, Printed Responses: Reinforcements needed, State content correctly Wound/Skin Impairment: Handouts: Caring for Your Ulcer, Skin Care Do's and Dont's Methods: Explain/Verbal, Printed Responses: Reinforcements needed, State content correctly Electronic Signature(s) Signed: 07/31/2020 6:40:41 PM By: Baruch Gouty RN, BSN Entered By: Baruch Gouty on 07/31/2020 12:15:00 -------------------------------------------------------------------------------- Wound Assessment Details Patient Name: Date of Service: [name], [place] MES A. 07/31/2020 10:30 A M Medical Record Number: [id number] Patient Account Number: [id number] Date of Birth/Sex: Treating RN: [birth year] (73 y.o. Male) Levan Hurst Primary Care Kaeya Schiffer: Dimas Chyle Other Clinician: Referring Maurisa Tesmer: Treating Raia Amico/Extender: Welford Roche Weeks in Treatment: 0 Wound Status Wound Number: 1 Primary Etiology: Pressure Ulcer Wound Location: Right, Medial Foot Wound Status: Open Wounding Event: Pressure Injury Comorbid History: Hypertension, Peripheral Venous Disease Date Acquired: 04/06/2020 Weeks Of Treatment: 0 Clustered Wound: No Photos Photo Uploaded By: Mikeal Hawthorne on 07/31/2020 15:42:59 Wound Measurements Length: (cm) 1.4 Width: (cm) 2.6 Depth: (cm) 0.1 Area: (cm) 2.859 Volume: (cm) 0.286 % Reduction in Area: 0% % Reduction in Volume: 0% Epithelialization: None Tunneling: No Undermining: No Wound Description Classification: Category/Stage III Wound Margin: Flat and Intact Exudate Amount: Medium Exudate Type: Serosanguineous Exudate Color: red, brown Foul Odor After Cleansing: No Slough/Fibrino Yes Wound Bed Granulation Amount: Large (67-100%) Exposed Structure Granulation Quality: Red, Hyper-granulation Fascia Exposed: No Necrotic Amount: Small (1-33%) Fat Layer (Subcutaneous Tissue) Exposed: Yes Necrotic Quality: Adherent Slough Tendon Exposed: No Muscle Exposed: No Joint Exposed: No Bone Exposed: No Electronic Signature(s) Signed: 08/01/2020 4:47:33 PM By: Levan Hurst RN, BSN Entered By: Levan Hurst on 07/31/2020 11:42:16 -------------------------------------------------------------------------------- Wound Assessment Details Patient Name: Date of Service: [name], [name] 07/31/2020 10:30 A M Medical Record Number: [id number] Patient Account Number: [id number] Date of Birth/Sex: Treating RN:  [birth year] (74 y.o. Male) Levan Hurst Primary Care Dariyah Garduno: Dimas Chyle Other Clinician: Referring Obadiah Dennard: Treating Keionte Swicegood/Extender: Welford Roche Weeks in Treatment: 0 Wound Status Wound Number: 2 Primary Etiology:  Pressure Ulcer Wound Location: Right, Lateral, Dorsal Foot Wound Status: Open Wounding Event: Gradually Appeared Comorbid History: Hypertension, Peripheral Venous Disease Date Acquired: 04/06/2020 Weeks Of Treatment: 0 Clustered Wound: No Photos Photo Uploaded By: Mikeal Hawthorne on 07/31/2020 15:42:59 Wound Measurements Length: (cm) 0.8 Width: (cm) 0.7 Depth: (cm) 0.1 Area: (cm) 0.44 Volume: (cm) 0.044 % Reduction in Area: % Reduction in Volume: Epithelialization: None Tunneling: No Undermining: No Wound Description Classification: Category/Stage III Wound Margin: Flat and Intact Exudate Amount: Medium Exudate Type: Serosanguineous Exudate Color: red, brown Foul Odor After Cleansing: No Slough/Fibrino Yes Wound Bed Granulation Amount: Large (67-100%) Exposed Structure Granulation Quality: Red Fascia Exposed: No Necrotic Amount: Small (1-33%) Fat Layer (Subcutaneous Tissue) Exposed: Yes Necrotic Quality: Adherent Slough Tendon Exposed: No Muscle Exposed: No Joint Exposed: No Bone Exposed: No Treatment Notes Wound #2 (Right, Lateral, Dorsal Foot) 1. Cleanse With Wound Cleanser 2. Periwound Care Moisturizing lotion 3. Primary Dressing Applied Calcium Alginate Ag 4. Secondary Dressing Dry Gauze Heel Cup 6. Support Layer Applied Kerlix/Coban Notes Horticulturist, commercial) Signed: 08/01/2020 4:47:33 PM By: Levan Hurst RN, BSN Entered By: Levan Hurst on 07/31/2020 11:43:39 -------------------------------------------------------------------------------- Vitals Details Patient Name: Date of Service: [name], [place] MES A. 07/31/2020 10:30 A M Medical Record Number: [id number] Patient Account Number: [id number] Date of Birth/Sex: Treating RN: 12/11/1945 (74 y.o. Male) Levan Hurst Primary Care Zyra Parrillo: Dimas Chyle Other Clinician: Referring Elisabel Hanover: Treating Marko Skalski/Extender: Welford Roche Weeks in Treatment: 0 Vital  Signs Time Taken: 11:14 Temperature (F): 98.3 Height (in): 69 Pulse (bpm): 70 Source: Stated Respiratory Rate (breaths/min): 16 Weight (lbs): 160 Blood Pressure (mmHg): 183/79 Source: Stated Reference Range: 80 - 120 mg / dl Body Mass Index (BMI): 23.6 Electronic Signature(s) Signed: 08/01/2020 4:47:33 PM By: Levan Hurst RN, BSN Entered By: Levan Hurst on 07/31/2020 11:14:46

## 2020-08-01 NOTE — Progress Notes (Signed)
Daniel [name], Daniel [name] ([id number]) Visit Report for 07/31/2020 Chief Complaint Document Details Patient Name: Date of Service: Daniel [name] MES A. 07/31/2020 10:30 A M Medical Record Number: [id number] Patient Account Number: [id number] Date of Birth/Sex: Treating RN: September 21, 1946 (74 y.o. Male) Daniel [name] Primary Care Provider: Dimas [name] Other Clinician: Referring Provider: Treating Provider/Extender: Daniel [name] in Treatment: 0 Information Obtained from: Patient Chief Complaint Pressure ulcer right heel and foot Electronic Signature(s) Signed: 07/31/2020 12:07:51 PM By: Daniel Keeler PA-C Entered By: Daniel [name] on 07/31/2020 12:07:50 -------------------------------------------------------------------------------- HPI Details Patient Name: Date of Service: Daniel [name] MES A. 07/31/2020 10:30 A M Medical Record Number: [id number] Patient Account Number: [id number] Date of Birth/Sex: Treating RN: 19-Jun-1946 (74 y.o. Male) Daniel [name] Primary Care Provider: Dimas [name] Other Clinician: Referring Provider: Treating Provider/Extender: Daniel [name] in Treatment: 0 History of Present Illness HPI Description: 07/31/2020 on evaluation today patient appears to be doing somewhat poorly and this is the first time that I am seeing him in regard to his heel. With that being said he seems to be making some progress although he has had a nurse coming out once a week to perform dressing changes they have been doing silver alginate for the top of his foot and Santyl for the heel. I really think he may be able to switch over to using silver alginate for the heel as well based on what I am seeing today. There does not appear to be any signs of active infection at this time which is great news. The patient does have a history of peripheral vascular disease today his ABI on the right is 0.69. He also has a history of hypertension and having had a  stroke in the past with some residual unilateral weakness. Other than this overall he seems to be managing quite well all things considered. Electronic Signature(s) Signed: 07/31/2020 12:19:29 PM By: Daniel Keeler PA-C Entered By: Daniel [name] on 07/31/2020 12:19:29 -------------------------------------------------------------------------------- Physical Exam Details Patient Name: Date of Service: Daniel RPER, [name] 07/31/2020 10:30 A M Medical Record Number: [id number] Patient Account Number: [id number] Date of Birth/Sex: Treating RN: Aug 10, 1946 (74 y.o. Male) Daniel [name] Primary Care Provider: Dimas [name] Other Clinician: Referring Provider: Treating Provider/Extender: Daniel [name] in Treatment: 0 Constitutional patient is hypertensive.. pulse regular and within target range for patient.[name] respirations regular, non-labored and within target range for patient.[name] temperature within target range for patient.. Well-nourished and well-hydrated in no acute distress. Eyes conjunctiva clear no eyelid edema noted. pupils equal round and reactive to light and accommodation. Ears, Nose, Mouth, and Throat no gross abnormality of ear auricles or external auditory canals. normal hearing noted during conversation. mucus membranes moist. Respiratory normal breathing without difficulty. Cardiovascular 1+ pitting edema of the bilateral lower extremities. Musculoskeletal Patient unable to walk without assistance. Psychiatric this patient is able to make decisions and demonstrates good insight into disease process. Alert and Oriented x 3. pleasant and cooperative. Notes Upon inspection patient's wound bed actually showed signs of good granulation at both locations there was no significant slough buildup at this point and I do not believe he is going require any additional Santyl use at this point. Obviously I think that the Annitta Needs has done his job quite well but I think  at the point of alginate being more appropriate dressing currently. Electronic Signature(s) Signed: 07/31/2020 12:20:12 PM By: Daniel Keeler PA-C Entered By: Daniel [name]  on 07/31/2020 12:20:12 -------------------------------------------------------------------------------- Physician Orders Details Patient Name: Date of Service: Daniel RPER, [name] 07/31/2020 10:30 A M Medical Record Number: [id number] Patient Account Number: [id number] Date of Birth/Sex: Treating RN: 01-Apr-1946 (74 y.o. Male) Daniel [name] Primary Care Provider: Dimas [name] Other Clinician: Referring Provider: Treating Provider/Extender: Daniel [name] in Treatment: 0 Verbal / Phone Orders: No Diagnosis Coding ICD-10 Coding Code Description [phone] Pressure ulcer of right heel, stage 3 L89.893 Pressure ulcer of other site, stage 3 I73.89 Other specified peripheral vascular diseases I10 Essential (primary) hypertension Follow-up Appointments Return Appointment in 1 week. Dressing Change Frequency Other: - 2 times per week Skin Barriers/Peri-Wound Care Moisturizing lotion - to leg and foot Wound Cleansing Clean wound with Wound Cleanser May shower with protection. Primary Wound Dressing Wound #1 Right,Medial Calcaneus Calcium Alginate with Silver Wound #2 Right,Lateral,Dorsal Foot Calcium Alginate with Silver Secondary Dressing Wound #1 Right,Medial Calcaneus Dry Gauze Wound #2 Right,Lateral,Dorsal Foot Dry Gauze Edema Control Kerlix and Coban - Right Lower Extremity - not too tight Avoid standing for long periods of time Elevate legs to the level of the heart or above for 30 minutes daily and/or when sitting, a frequency of: - throughout the day Exercise regularly [facility] for wound care. Jackquline [place] Electronic Signature(s) Signed: 07/31/2020 6:40:41 PM By: Daniel Gouty RN, BSN Signed: 07/31/2020 7:06:00 PM By: Daniel Keeler  PA-C Entered By: Daniel [name] on 07/31/2020 12:19:03 -------------------------------------------------------------------------------- Problem List Details Patient Name: Date of Service: Daniel RPER, [name] 07/31/2020 10:30 A M Medical Record Number: [id number] Patient Account Number: [id number] Date of Birth/Sex: Treating RN: 1946-04-28 (74 y.o. Male) Daniel [name] Primary Care Provider: Dimas [name] Other Clinician: Referring Provider: Treating Provider/Extender: Daniel [name] in Treatment: 0 Active Problems ICD-10 Encounter Code Description Active Date MDM Diagnosis L89.613 Pressure ulcer of right heel, stage 3 07/31/2020 No Yes L89.893 Pressure ulcer of other site, stage 3 07/31/2020 No Yes I73.89 Other specified peripheral vascular diseases 07/31/2020 No Yes I10 Essential (primary) hypertension 07/31/2020 No Yes Inactive Problems Resolved Problems Electronic Signature(s) Signed: 07/31/2020 12:07:21 PM By: Daniel Keeler PA-C Entered By: Daniel [name] on 07/31/2020 12:07:21 -------------------------------------------------------------------------------- Progress Note Details Patient Name: Date of Service: Daniel RPER, [place] MES A. 07/31/2020 10:30 A M Medical Record Number: [id number] Patient Account Number: [id number] Date of Birth/Sex: Treating RN: March 31, 1946 (74 y.o. Male) Daniel [name] Primary Care Provider: Dimas [name] Other Clinician: Referring Provider: Treating Provider/Extender: Daniel [name] in Treatment: 0 Subjective Chief Complaint Information obtained from Patient Pressure ulcer right heel and foot History of Present Illness (HPI) 07/31/2020 on evaluation today patient appears to be doing somewhat poorly and this is the first time that I am seeing him in regard to his heel. With that being said he seems to be making some progress although he has had a nurse coming out once a week to perform dressing changes they  have been doing silver alginate for the top of his foot and Santyl for the heel. I really think he may be able to switch over to using silver alginate for the heel as well based on what I am seeing today. There does not appear to be any signs of active infection at this time which is great news. The patient does have a history of peripheral vascular disease today his ABI on the right is 0.69. He also has a history of hypertension and having had a  stroke in the past with some residual unilateral weakness. Other than this overall he seems to be managing quite well all things considered. Patient History Information obtained from Patient. Allergies No Known Drug Allergies Family History Cancer - Siblings, Hypertension - Mother, Stroke - Siblings, No family history of Diabetes, Heart Disease, Hereditary Spherocytosis, Kidney Disease, Lung Disease, Seizures, Thyroid Problems, Tuberculosis. Social History Former smoker - quit 1 year ago, Marital Status - Single, Alcohol Use - Never, Drug Use - Prior History - Marijuana, Caffeine Use - Moderate - Soda. 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 Eyes Denies complaints or symptoms of Dry Eyes, Vision Changes, Glasses / Contacts. Ear/Nose/Mouth/Throat Denies complaints or symptoms of Chronic sinus problems or rhinitis. Respiratory Denies complaints or symptoms of Chronic or frequent coughs, Shortness of Breath. Gastrointestinal Denies complaints or symptoms of Frequent diarrhea, Nausea, Vomiting. Endocrine Denies complaints or symptoms of Heat/cold intolerance. Genitourinary Denies complaints or symptoms  of Frequent urination. Integumentary (Skin) Complains or has symptoms of Wounds - wounds on right heel and foot. Musculoskeletal Denies complaints or symptoms of Muscle Pain, Muscle Weakness. Objective Constitutional patient is hypertensive.. pulse regular and within target range for patient.[name] respirations regular, non-labored and within target range for patient.[name] temperature within target range for patient.. Well-nourished and well-hydrated in no acute distress. Vitals Time Taken: 11:14 AM, Height: 69 in, Source: Stated, Weight: 160 lbs, Source: Stated, BMI: 23.6, Temperature: 98.3 F, Pulse: 70 bpm, Respiratory Rate: 16 breaths/min, Blood Pressure: 183/79 mmHg. Eyes conjunctiva clear no eyelid edema noted. pupils equal round and reactive to light and accommodation. Ears, Nose, Mouth, and Throat no gross abnormality of ear auricles or external auditory canals. normal hearing noted during conversation. mucus membranes moist. Respiratory normal breathing without difficulty. Cardiovascular 1+ pitting edema of the bilateral lower extremities. Musculoskeletal Patient unable to walk without assistance. Psychiatric this patient is able to make decisions and demonstrates good insight into disease process. Alert and Oriented x 3. pleasant and cooperative. General Notes: Upon inspection patient's wound bed actually showed signs of good granulation at both locations there was no significant slough buildup at this point and I do not believe he is going require any additional Santyl use at this point. Obviously I think that the Annitta Needs has done his job quite well but I think at the point of alginate being more appropriate dressing currently. Integumentary (Hair, Skin) Wound #1 status is Open. Original cause of wound was Pressure Injury. The wound is located on the Right,Medial Calcaneus. The wound measures 1.4cm length x 2.6cm width x 0.1cm depth; 2.859cm^2 area and 0.286cm^3 volume. There is Fat  Layer (Subcutaneous Tissue) exposed. There is no tunneling or undermining noted. There is a medium amount of serosanguineous drainage noted. The wound margin is flat and intact. There is large (67-100%) red, hyper - granulation within the wound bed. There is a small (1-33%) amount of necrotic tissue within the wound bed including Adherent Slough. Wound #2 status is Open. Original cause of wound was Gradually Appeared. The wound is located on the Right,Lateral,Dorsal Foot. The wound measures 0.8cm length x 0.7cm width x 0.1cm depth; 0.44cm^2 area and 0.044cm^3 volume. There is Fat Layer (Subcutaneous Tissue) exposed. There is no tunneling or undermining noted. There is a medium amount of serosanguineous  drainage noted. The wound margin is flat and intact. There is large (67-100%) red granulation within the wound bed. There is a small (1-33%) amount of necrotic tissue within the wound bed including Adherent Slough. Assessment Active Problems ICD-10 Pressure ulcer of right heel, stage 3 Pressure ulcer of other site, stage 3 Other specified peripheral vascular diseases Essential (primary) hypertension Plan Follow-up Appointments: Return Appointment in 1 week. Dressing Change Frequency: Other: - 2 times per week Skin Barriers/Peri-Wound Care: Moisturizing lotion - to leg and foot Wound Cleansing: Clean wound with Wound Cleanser May shower with protection. Primary Wound Dressing: Wound #1 Right,Medial Calcaneus: Calcium Alginate with Silver Wound #2 Right,Lateral,Dorsal Foot: Calcium Alginate with Silver Secondary Dressing: Wound #1 Right,Medial Calcaneus: Dry Gauze Wound #2 Right,Lateral,Dorsal Foot: Dry Gauze Edema Control: Kerlix and Coban - Right Lower Extremity - not too tight Avoid standing for long periods of time Elevate legs to the level of the heart or above for 30 minutes daily and/or when sitting, a frequency of: - throughout the day Exercise regularly Home  Health: [place] skilled nursing for wound care. - [facility]. I would recommend at this point that we go ahead and continue with the use of a silver alginate dressing I think this is probably good for both wound locations. 2. I am also can recommend at this time that we have the patient going to continue with a very light Kerlix and Coban wrap in order to help with some of the edema in his foot. 3. I am also can recommend that the patient continue to be monitored for any signs of worsening if he is not improving and healing as appropriate as I would like to see that I will recommend a referral to vascular for 4 formal arterial testing. We will see patient back for reevaluation in 1 week here in the clinic. If anything worsens or changes patient will contact our office for additional recommendations. Electronic Signature(s) Signed: 07/31/2020 12:23:17 PM By: Daniel Keeler PA-C Entered By: Daniel [name] on 07/31/2020 12:23:17 -------------------------------------------------------------------------------- HxROS Details Patient Name: Date of Service: Daniel RPER, [name] 07/31/2020 10:30 A M Medical Record Number: [id number] Patient Account Number: [id number] Date of Birth/Sex: Treating RN: 1946/06/27 (74 y.o. Male) Levan Hurst Primary Care Provider: Dimas [name] Other Clinician: Referring Provider: Treating Provider/Extender: Daniel [name] in Treatment: 0 Information Obtained From Patient Constitutional Symptoms (General Health) Complaints and Symptoms: Negative for: Fatigue; Fever; Chills; Marked Weight Change Eyes Complaints and Symptoms: Negative for: Dry Eyes; Vision Changes; Glasses / Contacts Ear/Nose/Mouth/Throat Complaints and Symptoms: Negative for: Chronic sinus problems or rhinitis Respiratory Complaints and Symptoms: Negative for: Chronic or frequent coughs; Shortness of Breath Gastrointestinal Complaints and Symptoms: Negative  for: Frequent diarrhea; Nausea; Vomiting Endocrine Complaints and Symptoms: Negative for: Heat/cold intolerance Genitourinary Complaints and Symptoms: Negative for: Frequent urination Integumentary (Skin) Complaints and Symptoms: Positive for: Wounds - wounds on right heel and foot Musculoskeletal Complaints and Symptoms: Negative for: Muscle Pain; Muscle Weakness Hematologic/Lymphatic Cardiovascular Medical History: Positive for: Hypertension; Peripheral Venous Disease Past Medical History Notes: CVA 2010 Immunological Neurologic Medical History: Past Medical History Notes: CVA 2010 with right side hemiparesis Oncologic Psychiatric Medical History: Past Medical History Notes: Schizophrenia, Psychosis Immunizations Pneumococcal Vaccine: Received Pneumococcal Vaccination: Yes Implantable Devices None Family and Social History Cancer: Yes - Siblings; Diabetes: No; Heart Disease: No; Hereditary Spherocytosis: No; Hypertension: Yes - Mother; Kidney Disease: No; Lung Disease: No; Seizures: No; Stroke: Yes - Siblings; Thyroid Problems: No;  Tuberculosis: No; Former smoker - quit 1 year ago; Marital Status - Single; Alcohol Use: Never; Drug Use: Prior History - Marijuana; Caffeine Use: Moderate - Soda; Financial Concerns: No; Food, Clothing or Shelter Needs: No; Support System Lacking: No; Transportation Concerns: No Engineer, maintenance) Signed: 07/31/2020 7:06:00 PM By: Daniel Keeler PA-C Signed: 08/01/2020 4:47:33 PM By: Levan Hurst RN, BSN Entered By: Levan Hurst on 07/31/2020 11:30:59 -------------------------------------------------------------------------------- [place] Details Patient Name: Date of Service: Daniel RPER, [place] MES A. 07/31/2020 Medical Record Number: [id number] Patient Account Number: [id number] Date of Birth/Sex: Treating RN: [birth year] (74 y.o. Male) Daniel [name] Primary Care Provider: Dimas [name] Other Clinician: Referring  Provider: Treating Provider/Extender: Daniel [name] in Treatment: 0 Diagnosis Coding ICD-10 Codes Code Description [phone] Pressure ulcer of right heel, stage 3 L89.893 Pressure ulcer of other site, stage 3 I73.89 Other specified peripheral vascular diseases I10 Essential (primary) hypertension Facility Procedures CPT4 Code: [id number] Description: [phone] - WOUND CARE VISIT-LEV 5 EST PT Modifier: Quantity: 1 Physician Procedures : CPT4 Code Description Modifier [id number] - WC PHYS LEVEL 3 - EST PT ICD-10 Diagnosis Description L89.613 Pressure ulcer of right heel, stage 3 L89.893 Pressure ulcer of other site, stage 3 I73.89 Other specified peripheral vascular diseases I10  Essential (primary) hypertension Quantity: 1 Electronic Signature(s) Signed: 07/31/2020 12:23:38 PM By: Daniel Keeler PA-C Entered By: Daniel [name] on 07/31/2020 12:23:37

## 2020-08-02 DIAGNOSIS — I739 Peripheral vascular disease, unspecified: Secondary | ICD-10-CM | POA: Diagnosis not present

## 2020-08-02 DIAGNOSIS — N183 Chronic kidney disease, stage 3 unspecified: Secondary | ICD-10-CM | POA: Diagnosis not present

## 2020-08-02 DIAGNOSIS — I69351 Hemiplegia and hemiparesis following cerebral infarction affecting right dominant side: Secondary | ICD-10-CM | POA: Diagnosis not present

## 2020-08-02 DIAGNOSIS — L89612 Pressure ulcer of right heel, stage 2: Secondary | ICD-10-CM | POA: Diagnosis not present

## 2020-08-02 DIAGNOSIS — I129 Hypertensive chronic kidney disease with stage 1 through stage 4 chronic kidney disease, or unspecified chronic kidney disease: Secondary | ICD-10-CM | POA: Diagnosis not present

## 2020-08-02 DIAGNOSIS — I82511 Chronic embolism and thrombosis of right femoral vein: Secondary | ICD-10-CM | POA: Diagnosis not present

## 2020-08-02 DIAGNOSIS — D631 Anemia in chronic kidney disease: Secondary | ICD-10-CM | POA: Diagnosis not present

## 2020-08-02 DIAGNOSIS — F2089 Other schizophrenia: Secondary | ICD-10-CM | POA: Diagnosis not present

## 2020-08-02 DIAGNOSIS — E785 Hyperlipidemia, unspecified: Secondary | ICD-10-CM | POA: Diagnosis not present

## 2020-08-05 DIAGNOSIS — L89612 Pressure ulcer of right heel, stage 2: Secondary | ICD-10-CM | POA: Diagnosis not present

## 2020-08-05 DIAGNOSIS — E785 Hyperlipidemia, unspecified: Secondary | ICD-10-CM | POA: Diagnosis not present

## 2020-08-05 DIAGNOSIS — I69351 Hemiplegia and hemiparesis following cerebral infarction affecting right dominant side: Secondary | ICD-10-CM | POA: Diagnosis not present

## 2020-08-05 DIAGNOSIS — D631 Anemia in chronic kidney disease: Secondary | ICD-10-CM | POA: Diagnosis not present

## 2020-08-05 DIAGNOSIS — I739 Peripheral vascular disease, unspecified: Secondary | ICD-10-CM | POA: Diagnosis not present

## 2020-08-05 DIAGNOSIS — I129 Hypertensive chronic kidney disease with stage 1 through stage 4 chronic kidney disease, or unspecified chronic kidney disease: Secondary | ICD-10-CM | POA: Diagnosis not present

## 2020-08-05 DIAGNOSIS — N183 Chronic kidney disease, stage 3 unspecified: Secondary | ICD-10-CM | POA: Diagnosis not present

## 2020-08-05 DIAGNOSIS — I82511 Chronic embolism and thrombosis of right femoral vein: Secondary | ICD-10-CM | POA: Diagnosis not present

## 2020-08-05 DIAGNOSIS — F2089 Other schizophrenia: Secondary | ICD-10-CM | POA: Diagnosis not present

## 2020-08-07 ENCOUNTER — Encounter (HOSPITAL_BASED_OUTPATIENT_CLINIC_OR_DEPARTMENT_OTHER): Payer: Medicare PPO | Attending: Physician Assistant | Admitting: Physician Assistant

## 2020-08-07 ENCOUNTER — Other Ambulatory Visit: Payer: Self-pay

## 2020-08-07 DIAGNOSIS — L89893 Pressure ulcer of other site, stage 3: Secondary | ICD-10-CM | POA: Insufficient documentation

## 2020-08-07 DIAGNOSIS — L89613 Pressure ulcer of right heel, stage 3: Secondary | ICD-10-CM | POA: Insufficient documentation

## 2020-08-07 DIAGNOSIS — I10 Essential (primary) hypertension: Secondary | ICD-10-CM | POA: Diagnosis not present

## 2020-08-07 DIAGNOSIS — I7389 Other specified peripheral vascular diseases: Secondary | ICD-10-CM | POA: Diagnosis not present

## 2020-08-07 DIAGNOSIS — I739 Peripheral vascular disease, unspecified: Secondary | ICD-10-CM | POA: Insufficient documentation

## 2020-08-07 DIAGNOSIS — Z8673 Personal history of transient ischemic attack (TIA), and cerebral infarction without residual deficits: Secondary | ICD-10-CM | POA: Insufficient documentation

## 2020-08-07 NOTE — Progress Notes (Addendum)
ADRIAAN, MALTESE ([id number]) Visit Report for 08/07/2020 Chief Complaint Document Details Patient Name: Date of Service: HA [name] MES A. 08/07/2020 12:45 PM Medical Record Number: [id number] Patient Account Number: [id number] Date of Birth/Sex: Treating RN: 1946-11-02 (74 y.o. [name] Primary Care Provider: Dimas Chyle Other Clinician: Referring Provider: Treating Provider/Extender: Welford Roche Weeks in Treatment: 1 Information Obtained from: Patient Chief Complaint Pressure ulcer right heel and foot Electronic Signature(s) Signed: 08/07/2020 1:26:39 PM By: Worthy Keeler PA-C Entered By: Worthy Keeler on 08/07/2020 13:26:39 -------------------------------------------------------------------------------- HPI Details Patient Name: Date of Service: [name], [name] 08/07/2020 12:45 PM Medical Record Number: [id number] Patient Account Number: [id number] Date of Birth/Sex: Treating RN: [birth year] (74 y.o. [name] Primary Care Provider: Dimas Chyle Other Clinician: Referring Provider: Treating Provider/Extender: Lebron Quam in Treatment: 1 History of Present Illness HPI Description: 07/31/2020 on evaluation today patient appears to be doing somewhat poorly and this is the first time that I am seeing him in regard to his heel. With that being said he seems to be making some progress although he has had a nurse coming out once a week to perform dressing changes they have been doing silver alginate for the top of his foot and Santyl for the heel. I really think he may be able to switch over to using silver alginate for the heel as well based on what I am seeing today. There does not appear to be any signs of active infection at this time which is great news. The patient does have a history of peripheral vascular disease today his ABI on the right is 0.69. He also has a history of hypertension and having had a stroke in the  past with some residual unilateral weakness. Other than this overall he seems to be managing quite well all things considered. 08/07/2020 on evaluation today patient appears to be doing well with regard to his wounds. I do feel like there is some improvement here. Fortunately there is no signs of active infection which is great news. No fevers, chills, nausea, vomiting, or diarrhea. Unfortunately is noted that he does have bedbugs today as well. He has home health coming out so we will get a give him a chance to have this treated he states that he was aware of this and did have an exterminator out 2 weeks ago. Nonetheless he may need to call them back to have things evaluated. Electronic Signature(s) Signed: 08/07/2020 2:04:20 PM By: Worthy Keeler PA-C Entered By: Worthy Keeler on 08/07/2020 14:04:20 -------------------------------------------------------------------------------- Physical Exam Details Patient Name: Date of Service: [name], [name] 08/07/2020 12:45 PM Medical Record Number: [id number] Patient Account Number: [id number] Date of Birth/Sex: Treating RN: 10/03/1946 (74 y.o. [name] Primary Care Provider: Dimas Chyle Other Clinician: Referring Provider: Treating Provider/Extender: Welford Roche Weeks in Treatment: 1 Constitutional Well-nourished and well-hydrated in no acute distress. Respiratory normal breathing without difficulty. Psychiatric this patient is able to make decisions and demonstrates good insight into disease process. Alert and Oriented x 3. pleasant and cooperative. Notes Patient's wound bed actually showed signs of good granulation and epithelization I feel like both wounds are actually doing much better which is great news he is tolerating the Curlex and Coban wrap. Electronic Signature(s) Signed: 08/07/2020 2:04:33 PM By: Worthy Keeler PA-C Entered By: Worthy Keeler on 08/07/2020  14:04:33 -------------------------------------------------------------------------------- Physician Orders Details Patient Name: Date of Service: HA  RPER, [name] 08/07/2020 12:45 PM Medical Record Number: [id number] Patient Account Number: [id number] Date of Birth/Sex: Treating RN: 1946/07/31 (74 y.o. [name] Primary Care Provider: Dimas Chyle Other Clinician: Referring Provider: Treating Provider/Extender: Lebron Quam in Treatment: 1 Verbal / Phone Orders: No Diagnosis Coding ICD-10 Coding Code Description [phone] Pressure ulcer of right heel, stage 3 L89.893 Pressure ulcer of other site, stage 3 I73.89 Other specified peripheral vascular diseases I10 Essential (primary) hypertension Follow-up Appointments Return appointment in 1 month. Dressing Change Frequency Wound #1 Right,Medial Calcaneus Other: - 2 times per week Wound #2 Right,Lateral,Dorsal Foot Other: - 2 times per week Skin Barriers/Peri-Wound Care Moisturizing lotion - to leg and foot with dressing changes Wound Cleansing Clean wound with Wound Cleanser May shower with protection. Primary Wound Dressing Wound #1 Right,Medial Calcaneus Calcium Alginate with Silver Wound #2 Right,Lateral,Dorsal Foot Calcium Alginate with Silver Secondary Dressing Wound #1 Right,Medial Calcaneus Dry Gauze Wound #2 Right,Lateral,Dorsal Foot Dry Gauze Edema Control Kerlix and Coban - Right Lower Extremity - not too tight Avoid standing for long periods of time Elevate legs to the level of the heart or above for 30 minutes daily and/or when sitting, a frequency of: - throughout the day Exercise regularly [facility] for wound care. Jackquline [place] Electronic Signature(s) Signed: 08/07/2020 6:44:49 PM By: Baruch Gouty RN, BSN Signed: 08/10/2020 11:53:24 AM By: Worthy Keeler PA-C Entered By: Baruch Gouty on 08/07/2020  14:03:12 -------------------------------------------------------------------------------- Problem List Details Patient Name: Date of Service: [name], [name] 08/07/2020 12:45 PM Medical Record Number: [id number] Patient Account Number: [id number] Date of Birth/Sex: Treating RN: [birth year] (74 y.o. [name] Primary Care Provider: Dimas Chyle Other Clinician: Referring Provider: Treating Provider/Extender: Welford Roche Weeks in Treatment: 1 Active Problems ICD-10 Encounter Code Description Active Date MDM Diagnosis L89.613 Pressure ulcer of right heel, stage 3 07/31/2020 No Yes L89.893 Pressure ulcer of other site, stage 3 07/31/2020 No Yes I73.89 Other specified peripheral vascular diseases 07/31/2020 No Yes I10 Essential (primary) hypertension 07/31/2020 No Yes Inactive Problems Resolved Problems Electronic Signature(s) Signed: 08/07/2020 1:26:31 PM By: Worthy Keeler PA-C Entered By: Worthy Keeler on 08/07/2020 13:26:31 -------------------------------------------------------------------------------- Progress Note Details Patient Name: Date of Service: [name], [name] 08/07/2020 12:45 PM Medical Record Number: [id number] Patient Account Number: [id number] Date of Birth/Sex: Treating RN: July 02, 1946 (74 y.o. [name] Primary Care Provider: Dimas Chyle Other Clinician: Referring Provider: Treating Provider/Extender: Lebron Quam in Treatment: 1 Subjective Chief Complaint Information obtained from Patient Pressure ulcer right heel and foot History of Present Illness (HPI) 07/31/2020 on evaluation today patient appears to be doing somewhat poorly and this is the first time that I am seeing him in regard to his heel. With that being said he seems to be making some progress although he has had a nurse coming out once a week to perform dressing changes they have been doing silver alginate for the top of his foot and  Santyl for the heel. I really think he may be able to switch over to using silver alginate for the heel as well based on what I am seeing today. There does not appear to be any signs of active infection at this time which is great news. The patient does have a history of peripheral vascular disease today his ABI on the right is 0.69. He also has a history of hypertension and having had  a stroke in the past with some residual unilateral weakness. Other than this overall he seems to be managing quite well all things considered. 08/07/2020 on evaluation today patient appears to be doing well with regard to his wounds. I do feel like there is some improvement here. Fortunately there is no signs of active infection which is great news. No fevers, chills, nausea, vomiting, or diarrhea. Unfortunately is noted that he does have bedbugs today as well. He has home health coming out so we will get a give him a chance to have this treated he states that he was aware of this and did have an exterminator out 2 weeks ago. Nonetheless he may need to call them back to have things evaluated. Objective Constitutional Well-nourished and well-hydrated in no acute distress. Vitals Time Taken: 1:36 PM, Height: 69 in, Weight: 160 lbs, BMI: 23.6, Temperature: 98.4 F, Pulse: 56 bpm, Respiratory Rate: 16 breaths/min, Blood Pressure: 146/82 mmHg. Respiratory normal breathing without difficulty. Psychiatric this patient is able to make decisions and demonstrates good insight into disease process. Alert and Oriented x 3. pleasant and cooperative. General Notes: Patient's wound bed actually showed signs of good granulation and epithelization I feel like both wounds are actually doing much better which is great news he is tolerating the Curlex and Coban wrap. Integumentary (Hair, Skin) Wound #1 status is Open. Original cause of wound was Pressure Injury. The wound is located on the Right,Medial Calcaneus. The wound measures  0.5cm length x 1.5cm width x 0.2cm depth; 0.589cm^2 area and 0.118cm^3 volume. There is Fat Layer (Subcutaneous Tissue) exposed. There is no tunneling or undermining noted. There is a medium amount of serosanguineous drainage noted. The wound margin is flat and intact. There is large (67-100%) red, hyper - granulation within the wound bed. There is no necrotic tissue within the wound bed. Wound #2 status is Open. Original cause of wound was Gradually Appeared. The wound is located on the Right,Lateral,Dorsal Foot. The wound measures 0.6cm length x 0.6cm width x 0.1cm depth; 0.283cm^2 area and 0.028cm^3 volume. There is Fat Layer (Subcutaneous Tissue) exposed. There is no tunneling or undermining noted. There is a medium amount of serosanguineous drainage noted. The wound margin is flat and intact. There is large (67-100%) red granulation within the wound bed. There is a small (1-33%) amount of necrotic tissue within the wound bed including Adherent Slough. Assessment Active Problems ICD-10 Pressure ulcer of right heel, stage 3 Pressure ulcer of other site, stage 3 Other specified peripheral vascular diseases Essential (primary) hypertension Plan Follow-up Appointments: Return appointment in 1 month. Dressing Change Frequency: Wound #1 Right,Medial Calcaneus: Other: - 2 times per week Wound #2 Right,Lateral,Dorsal Foot: Other: - 2 times per week Skin Barriers/Peri-Wound Care: Moisturizing lotion - to leg and foot with dressing changes Wound Cleansing: Clean wound with Wound Cleanser May shower with protection. Primary Wound Dressing: Wound #1 Right,Medial Calcaneus: Calcium Alginate with Silver Wound #2 Right,Lateral,Dorsal Foot: Calcium Alginate with Silver Secondary Dressing: Wound #1 Right,Medial Calcaneus: Dry Gauze Wound #2 Right,Lateral,Dorsal Foot: Dry Gauze Edema Control: Kerlix and Coban - Right Lower Extremity - not too tight Avoid standing for long periods of  time Elevate legs to the level of the heart or above for 30 minutes daily and/or when sitting, a frequency of: - throughout the day Exercise regularly Home Health: [facility] for wound care. - [facility] 1. I would recommend that we continue with the Curlex and Coban wraps as the patient seems to be  doing so well he is in agreement with plan. We are using silver alginate to the wound bed. 2. I am also can recommend patient continue to monitor for any signs of worsening as far as infection is concerned. Obviously I think that home health can help to keep an eye on this as well. 3. I am also going to suggest he continue to elevate his legs much as possible try to keep edema under control. I do believe that that can be of utmost importance. We will see patient back for reevaluation in 1 week here in the clinic. If anything worsens or changes patient will contact our office for additional recommendations. Electronic Signature(s) Signed: 08/07/2020 2:05:15 PM By: Worthy Keeler PA-C Entered By: Worthy Keeler on 08/07/2020 14:05:14 -------------------------------------------------------------------------------- SuperBill Details Patient Name: Date of Service: [name], [name] 08/07/2020 Medical Record Number: [id number] Patient Account Number: [id number] Date of Birth/Sex: Treating RN: Jan 28, 1946 (74 y.o. [name] Primary Care Provider: Dimas Chyle Other Clinician: Referring Provider: Treating Provider/Extender: Welford Roche Weeks in Treatment: 1 Diagnosis Coding ICD-10 Codes Code Description [phone] Pressure ulcer of right heel, stage 3 L89.893 Pressure ulcer of other site, stage 3 I73.89 Other specified peripheral vascular diseases I10 Essential (primary) hypertension Facility Procedures CPT4 Code: [id number] Description: 99214 - WOUND CARE VISIT-LEV 4 EST PT Modifier: Quantity: 1 Physician Procedures : CPT4 Code Description  Modifier [id number] - WC PHYS LEVEL 3 - EST PT ICD-10 Diagnosis Description L89.613 Pressure ulcer of right heel, stage 3 L89.893 Pressure ulcer of other site, stage 3 I73.89 Other specified peripheral vascular diseases I10  Essential (primary) hypertension Quantity: 1 Electronic Signature(s) Signed: 08/07/2020 2:05:27 PM By: Worthy Keeler PA-C Entered By: Worthy Keeler on 08/07/2020 14:05:26

## 2020-08-08 NOTE — Progress Notes (Signed)
[name], [name] ([id number]) Visit Report for 08/07/2020 Arrival Information Details Patient Name: Date of Service: HA Daniel Faith MES A. 08/07/2020 12:45 PM Medical Record Number: [id number] Patient Account Number: [id number] Date of Birth/Sex: Treating RN: 1945-12-09 (74 y.o. Daniel [name] Primary Care Rahm Minix: Dimas Chyle Other Clinician: Referring Dakarai Mcglocklin: Treating Aroura Vasudevan/Extender: Lebron Quam in Treatment: 1 Visit Information History Since Last Visit Added or deleted any medications: No Patient Arrived: Wheel Chair Any new allergies or adverse reactions: No Arrival Time: 13:35 Had a fall or experienced change in No Accompanied By: self activities of daily living that may affect Transfer Assistance: None risk of falls: Patient Identification Verified: Yes Signs or symptoms of abuse/neglect since last visito No Secondary Verification Process Completed: Yes Hospitalized since last visit: No Patient Requires Transmission-Based Precautions: No Implantable device outside of the clinic excluding No Patient Has Alerts: Yes cellular tissue based products placed in the center Patient Alerts: Patient on Blood Thinner since last visit: Has Dressing in Place as Prescribed: Yes Pain Present Now: No Electronic Signature(s) Signed: 08/08/2020 9:05:51 AM By: Sandre Kitty Entered By: Sandre Kitty on 08/07/2020 13:35:51 -------------------------------------------------------------------------------- Clinic Level of Care Assessment Details Patient Name: Date of Service: [name], [name] 08/07/2020 12:45 PM Medical Record Number: [id number] Patient Account Number: [id number] Date of Birth/Sex: Treating RN: [birth year] (74 y.o. Daniel [name] Primary Care Emil Weigold: Dimas Chyle Other Clinician: Referring Juline Sanderford: Treating Jaqwon Manfred/Extender: Lebron Quam in Treatment: 1 Clinic Level of Care Assessment Items TOOL 4 Quantity  Score []  - 0 Use when only an EandM is performed on FOLLOW-UP visit ASSESSMENTS - Nursing Assessment / Reassessment X- 1 10 Reassessment of Co-morbidities (includes updates in patient status) X- 1 5 Reassessment of Adherence to Treatment Plan ASSESSMENTS - Wound and Skin A ssessment / Reassessment []  - 0 Simple Wound Assessment / Reassessment - one wound X- 2 5 Complex Wound Assessment / Reassessment - multiple wounds []  - 0 Dermatologic / Skin Assessment (not related to wound area) ASSESSMENTS - Focused Assessment X- 1 5 Circumferential Edema Measurements - multi extremities []  - 0 Nutritional Assessment / Counseling / Intervention X- 1 5 Lower Extremity Assessment (monofilament, tuning fork, pulses) []  - 0 Peripheral Arterial Disease Assessment (using hand held doppler) ASSESSMENTS - Ostomy and/or Continence Assessment and Care []  - 0 Incontinence Assessment and Management []  - 0 Ostomy Care Assessment and Management (repouching, etc.) PROCESS - Coordination of Care X - Simple Patient / Family Education for ongoing care 1 15 []  - 0 Complex (extensive) Patient / Family Education for ongoing care X- 1 10 Staff obtains Consents, Records, T Results / Process Orders est X- 1 10 Staff telephones HHA, Nursing Homes / Clarify orders / etc []  - 0 Routine Transfer to another Facility (non-emergent condition) []  - 0 Routine Hospital Admission (non-emergent condition) []  - 0 New Admissions / Biomedical engineer / Ordering NPWT Apligraf, etc. , []  - 0 Emergency Hospital Admission (emergent condition) X- 1 10 Simple Discharge Coordination []  - 0 Complex (extensive) Discharge Coordination PROCESS - Special Needs []  - 0 Pediatric / Minor Patient Management []  - 0 Isolation Patient Management []  - 0 Hearing / Language / Visual special needs []  - 0 Assessment of Community assistance (transportation, D/C planning, etc.) []  - 0 Additional assistance / Altered  mentation []  - 0 Support Surface(s) Assessment (bed, cushion, seat, etc.) INTERVENTIONS - Wound Cleansing / Measurement []  - 0 Simple Wound Cleansing - one wound X- 2  5 Complex Wound Cleansing - multiple wounds X- 1 5 Wound Imaging (photographs - any number of wounds) []  - 0 Wound Tracing (instead of photographs) []  - 0 Simple Wound Measurement - one wound X- 2 5 Complex Wound Measurement - multiple wounds INTERVENTIONS - Wound Dressings X - Small Wound Dressing one or multiple wounds 2 10 []  - 0 Medium Wound Dressing one or multiple wounds []  - 0 Large Wound Dressing one or multiple wounds X- 1 5 Application of Medications - topical []  - 0 Application of Medications - injection INTERVENTIONS - Miscellaneous []  - 0 External ear exam []  - 0 Specimen Collection (cultures, biopsies, blood, body fluids, etc.) []  - 0 Specimen(s) / Culture(s) sent or taken to Lab for analysis []  - 0 Patient Transfer (multiple staff / Civil Service fast streamer / Similar devices) []  - 0 Simple Staple / Suture removal (25 or less) []  - 0 Complex Staple / Suture removal (26 or more) []  - 0 Hypo / Hyperglycemic Management (close monitor of Blood Glucose) []  - 0 Ankle / Brachial Index (ABI) - do not check if billed separately X- 1 5 Vital Signs Has the patient been seen at the hospital within the last three years: Yes Total Score: 135 Level Of Care: New/Established - Level 4 Electronic Signature(s) Signed: 08/07/2020 6:44:49 PM By: Baruch Gouty RN, BSN Entered By: Baruch Gouty on 08/07/2020 14:01:54 -------------------------------------------------------------------------------- Encounter Discharge Information Details Patient Name: Date of Service: [name], [name] 08/07/2020 12:45 PM Medical Record Number: [id number] Patient Account Number: [id number] Date of Birth/Sex: Treating RN: January 10, 1946 (75 y.o. Daniel [name] Primary Care Jaquavian Firkus: Dimas Chyle Other Clinician: Referring  Monea Pesantez: Treating Aadi Bordner/Extender: Lebron Quam in Treatment: 1 Encounter Discharge Information Items Discharge Condition: Stable Ambulatory Status: Wheelchair Discharge Destination: Home Transportation: Other Accompanied By: self Schedule Follow-up Appointment: Yes Clinical Summary of Care: Patient Declined Notes transportation service Electronic Signature(s) Signed: 08/07/2020 6:44:49 PM By: Baruch Gouty RN, BSN Entered By: Baruch Gouty on 08/07/2020 14:26:15 -------------------------------------------------------------------------------- Lower Extremity Assessment Details Patient Name: Date of Service: [name], [name] 08/07/2020 12:45 PM Medical Record Number: [id number] Patient Account Number: [id number] Date of Birth/Sex: Treating RN: Feb 03, 1946 (74 y.o. Janyth Contes Primary Care Jaxyn Rout: Dimas Chyle Other Clinician: Referring Cleola Perryman: Treating Montie Gelardi/Extender: Welford Roche Weeks in Treatment: 1 Edema Assessment Assessed: [Left: No] [Right: No] Edema: [Left: Ye] [Right: s] Calf Left: Right: Point of Measurement: 31 cm From Medial Instep cm 32 cm Ankle Left: Right: Point of Measurement: 11 cm From Medial Instep cm 22 cm Vascular Assessment Pulses: Dorsalis Pedis Palpable: [Right:Yes] Electronic Signature(s) Signed: 08/08/2020 5:08:15 PM By: Levan Hurst RN, BSN Entered By: Levan Hurst on 08/07/2020 13:41:13 -------------------------------------------------------------------------------- [place] Details Patient Name: Date of Service: [name], [name] 08/07/2020 12:45 PM Medical Record Number: [id number] Patient Account Number: [id number] Date of Birth/Sex: Treating RN: 1946/04/29 (74 y.o. Daniel [name] Primary Care Joselito Fieldhouse: Dimas Chyle Other Clinician: Referring Lenna Hagarty: Treating Ennis Delpozo/Extender: Lebron Quam in Treatment: 1 Active  Inactive Abuse / Safety / Falls / Self Care Management Nursing Diagnoses: Potential for falls Goals: Patient/caregiver will verbalize/demonstrate measures taken to prevent injury and/or falls Date Initiated: 07/31/2020 Target Resolution Date: 08/28/2020 Goal Status: Active Interventions: Assess fall risk on admission and as needed Assess impairment of mobility on admission and as needed per policy Notes: Pressure Nursing Diagnoses: Knowledge deficit related to management of pressures ulcers Goals: Patient/caregiver will verbalize understanding of pressure  ulcer management Date Initiated: 07/31/2020 Target Resolution Date: 08/28/2020 Goal Status: Active Interventions: Assess: immobility, friction, shearing, incontinence upon admission and as needed Assess offloading mechanisms upon admission and as needed Notes: Wound/Skin Impairment Nursing Diagnoses: Impaired tissue integrity Knowledge deficit related to ulceration/compromised skin integrity Goals: Patient/caregiver will verbalize understanding of skin care regimen Date Initiated: 07/31/2020 Target Resolution Date: 08/28/2020 Goal Status: Active Ulcer/skin breakdown will have a volume reduction of 30% by week 4 Date Initiated: 07/31/2020 Target Resolution Date: 08/28/2020 Goal Status: Active Interventions: Assess patient/caregiver ability to obtain necessary supplies Assess patient/caregiver ability to perform ulcer/skin care regimen upon admission and as needed Assess ulceration(s) every visit Provide education on ulcer and skin care Treatment Activities: Skin care regimen initiated : 07/31/2020 Topical wound management initiated : 07/31/2020 Notes: Electronic Signature(s) Signed: 08/07/2020 6:44:49 PM By: Baruch Gouty RN, BSN Entered By: Baruch Gouty on 08/07/2020 13:56:07 -------------------------------------------------------------------------------- Pain Assessment Details Patient Name: Date of Service: [name],  [name] 08/07/2020 12:45 PM Medical Record Number: [id number] Patient Account Number: [id number] Date of Birth/Sex: Treating RN: 1946-09-07 (74 y.o. Daniel [name] Primary Care Haruko Mersch: Dimas Chyle Other Clinician: Referring Minela Bridgewater: Treating Joanne Salah/Extender: Welford Roche Weeks in Treatment: 1 Active Problems Location of Pain Severity and Description of Pain Patient Has Paino No Site Locations Pain Management and Medication Current Pain Management: Electronic Signature(s) Signed: 08/07/2020 6:44:49 PM By: Baruch Gouty RN, BSN Signed: 08/08/2020 9:05:51 AM By: Sandre Kitty Entered By: Sandre Kitty on 08/07/2020 13:36:18 -------------------------------------------------------------------------------- Patient/Caregiver Education Details Patient Name: Date of Service: HA Daniel Faith MES A. 9/1/2021andnbsp12:45 PM Medical Record Number: [id number] Patient Account Number: [id number] Date of Birth/Gender: Treating RN: 12/10/1945 (74 y.o. Daniel [name] Primary Care Physician: Dimas Chyle Other Clinician: Referring Physician: Treating Physician/Extender: Lebron Quam in Treatment: 1 Education Assessment Education Provided To: Patient Education Topics Provided Pressure: Methods: Explain/Verbal Responses: Reinforcements needed, State content correctly Wound/Skin Impairment: Methods: Explain/Verbal Responses: Reinforcements needed, State content correctly Electronic Signature(s) Signed: 08/07/2020 6:44:49 PM By: Baruch Gouty RN, BSN Entered By: Baruch Gouty on 08/07/2020 13:57:05 -------------------------------------------------------------------------------- Wound Assessment Details Patient Name: Date of Service: [name], [name] 08/07/2020 12:45 PM Medical Record Number: [id number] Patient Account Number: [id number] Date of Birth/Sex: Treating RN: 14-Mar-1946 (74 y.o. Daniel [name] Primary Care Kentravious Lipford:  Dimas Chyle Other Clinician: Referring Avelyn Touch: Treating Ziana Heyliger/Extender: Welford Roche Weeks in Treatment: 1 Wound Status Wound Number: 1 Primary Etiology: Pressure Ulcer Wound Location: Right, Medial Calcaneus Wound Status: Open Wounding Event: Pressure Injury Comorbid History: Hypertension, Peripheral Venous Disease Date Acquired: 04/06/2020 Weeks Of Treatment: 1 Clustered Wound: No Photos Photo Uploaded By: Mikeal Hawthorne on 08/08/2020 10:39:37 Wound Measurements Length: (cm) 0.5 Width: (cm) 1.5 Depth: (cm) 0.2 Area: (cm) 0.589 Volume: (cm) 0.118 % Reduction in Area: 79.4% % Reduction in Volume: 58.7% Epithelialization: Small (1-33%) Tunneling: No Undermining: No Wound Description Classification: Category/Stage III Wound Margin: Flat and Intact Exudate Amount: Medium Exudate Type: Serosanguineous Exudate Color: red, brown Foul Odor After Cleansing: No Slough/Fibrino No Wound Bed Granulation Amount: Large (67-100%) Exposed Structure Granulation Quality: Red, Hyper-granulation Fascia Exposed: No Necrotic Amount: None Present (0%) Fat Layer (Subcutaneous Tissue) Exposed: Yes Tendon Exposed: No Muscle Exposed: No Joint Exposed: No Bone Exposed: No Treatment Notes Wound #1 (Right, Medial Calcaneus) 2. Periwound Care Moisturizing lotion 3. Primary Dressing Applied Calcium Alginate Ag 4. Secondary Dressing Dry Gauze Heel Cup 6. Support Layer Holiday representative) Signed: 08/07/2020 6:44:49 PM By: Baruch Gouty  RN, BSN Signed: 08/08/2020 5:08:15 PM By: Levan Hurst RN, BSN Entered By: Levan Hurst on 08/07/2020 13:41:37 -------------------------------------------------------------------------------- Wound Assessment Details Patient Name: Date of Service: [name], [name] 08/07/2020 12:45 PM Medical Record Number: [id number] Patient Account Number: [id number] Date of Birth/Sex: Treating RN: 07-09-1946 (74  y.o. Daniel [name] Primary Care Latayvia Mandujano: Dimas Chyle Other Clinician: Referring Damika Harmon: Treating Jac Romulus/Extender: Welford Roche Weeks in Treatment: 1 Wound Status Wound Number: 2 Primary Etiology: Pressure Ulcer Wound Location: Right, Lateral, Dorsal Foot Wound Status: Open Wounding Event: Gradually Appeared Comorbid History: Hypertension, Peripheral Venous Disease Date Acquired: 04/06/2020 Weeks Of Treatment: 1 Clustered Wound: No Photos Photo Uploaded By: Mikeal Hawthorne on 08/08/2020 10:39:18 Wound Measurements Length: (cm) 0.6 Width: (cm) 0.6 Depth: (cm) 0.1 Area: (cm) 0.283 Volume: (cm) 0.028 % Reduction in Area: 35.7% % Reduction in Volume: 36.4% Epithelialization: Small (1-33%) Tunneling: No Undermining: No Wound Description Classification: Category/Stage III Wound Margin: Flat and Intact Exudate Amount: Medium Exudate Type: Serosanguineous Exudate Color: red, brown Foul Odor After Cleansing: No Slough/Fibrino Yes Wound Bed Granulation Amount: Large (67-100%) Exposed Structure Granulation Quality: Red Fascia Exposed: No Necrotic Amount: Small (1-33%) Fat Layer (Subcutaneous Tissue) Exposed: Yes Necrotic Quality: Adherent Slough Tendon Exposed: No Muscle Exposed: No Joint Exposed: No Bone Exposed: No Treatment Notes Wound #2 (Right, Lateral, Dorsal Foot) 2. Periwound Care Moisturizing lotion 3. Primary Dressing Applied Calcium Alginate Ag 4. Secondary Dressing Dry Gauze Heel Cup 6. Support Layer Holiday representative) Signed: 08/07/2020 6:44:49 PM By: Baruch Gouty RN, BSN Signed: 08/08/2020 5:08:15 PM By: Levan Hurst RN, BSN Entered By: Levan Hurst on 08/07/2020 13:41:53 -------------------------------------------------------------------------------- [place] Details Patient Name: Date of Service: [name], [name] 08/07/2020 12:45 PM Medical Record Number: [id number] Patient Account Number:  [id number] Date of Birth/Sex: Treating RN: 10-27-1946 (74 y.o. Daniel [name] Primary Care Juda Toepfer: Dimas Chyle Other Clinician: Referring Aadit Hagood: Treating Nathifa Ritthaler/Extender: Welford Roche Weeks in Treatment: 1 Vital Signs Time Taken: 13:36 Temperature (F): 98.4 Height (in): 69 Pulse (bpm): 56 Weight (lbs): 160 Respiratory Rate (breaths/min): 16 Body Mass Index (BMI): 23.6 Blood Pressure (mmHg): 146/82 Reference Range: 80 - 120 mg / dl Electronic Signature(s) Signed: 08/08/2020 9:05:51 AM By: Sandre Kitty Entered By: Sandre Kitty on 08/07/2020 13:36:12

## 2020-08-09 DIAGNOSIS — D631 Anemia in chronic kidney disease: Secondary | ICD-10-CM | POA: Diagnosis not present

## 2020-08-09 DIAGNOSIS — E785 Hyperlipidemia, unspecified: Secondary | ICD-10-CM | POA: Diagnosis not present

## 2020-08-09 DIAGNOSIS — F2089 Other schizophrenia: Secondary | ICD-10-CM | POA: Diagnosis not present

## 2020-08-09 DIAGNOSIS — I69351 Hemiplegia and hemiparesis following cerebral infarction affecting right dominant side: Secondary | ICD-10-CM | POA: Diagnosis not present

## 2020-08-09 DIAGNOSIS — N183 Chronic kidney disease, stage 3 unspecified: Secondary | ICD-10-CM | POA: Diagnosis not present

## 2020-08-09 DIAGNOSIS — I82511 Chronic embolism and thrombosis of right femoral vein: Secondary | ICD-10-CM | POA: Diagnosis not present

## 2020-08-09 DIAGNOSIS — I129 Hypertensive chronic kidney disease with stage 1 through stage 4 chronic kidney disease, or unspecified chronic kidney disease: Secondary | ICD-10-CM | POA: Diagnosis not present

## 2020-08-09 DIAGNOSIS — L89612 Pressure ulcer of right heel, stage 2: Secondary | ICD-10-CM | POA: Diagnosis not present

## 2020-08-09 DIAGNOSIS — I739 Peripheral vascular disease, unspecified: Secondary | ICD-10-CM | POA: Diagnosis not present

## 2020-08-12 DIAGNOSIS — N183 Chronic kidney disease, stage 3 unspecified: Secondary | ICD-10-CM | POA: Diagnosis not present

## 2020-08-12 DIAGNOSIS — F2089 Other schizophrenia: Secondary | ICD-10-CM | POA: Diagnosis not present

## 2020-08-12 DIAGNOSIS — E785 Hyperlipidemia, unspecified: Secondary | ICD-10-CM | POA: Diagnosis not present

## 2020-08-12 DIAGNOSIS — D631 Anemia in chronic kidney disease: Secondary | ICD-10-CM | POA: Diagnosis not present

## 2020-08-12 DIAGNOSIS — I739 Peripheral vascular disease, unspecified: Secondary | ICD-10-CM | POA: Diagnosis not present

## 2020-08-12 DIAGNOSIS — L89612 Pressure ulcer of right heel, stage 2: Secondary | ICD-10-CM | POA: Diagnosis not present

## 2020-08-12 DIAGNOSIS — I82511 Chronic embolism and thrombosis of right femoral vein: Secondary | ICD-10-CM | POA: Diagnosis not present

## 2020-08-12 DIAGNOSIS — I129 Hypertensive chronic kidney disease with stage 1 through stage 4 chronic kidney disease, or unspecified chronic kidney disease: Secondary | ICD-10-CM | POA: Diagnosis not present

## 2020-08-12 DIAGNOSIS — I69351 Hemiplegia and hemiparesis following cerebral infarction affecting right dominant side: Secondary | ICD-10-CM | POA: Diagnosis not present

## 2020-08-15 DIAGNOSIS — I129 Hypertensive chronic kidney disease with stage 1 through stage 4 chronic kidney disease, or unspecified chronic kidney disease: Secondary | ICD-10-CM | POA: Diagnosis not present

## 2020-08-15 DIAGNOSIS — I69351 Hemiplegia and hemiparesis following cerebral infarction affecting right dominant side: Secondary | ICD-10-CM | POA: Diagnosis not present

## 2020-08-15 DIAGNOSIS — I739 Peripheral vascular disease, unspecified: Secondary | ICD-10-CM | POA: Diagnosis not present

## 2020-08-15 DIAGNOSIS — I82511 Chronic embolism and thrombosis of right femoral vein: Secondary | ICD-10-CM | POA: Diagnosis not present

## 2020-08-15 DIAGNOSIS — L89612 Pressure ulcer of right heel, stage 2: Secondary | ICD-10-CM | POA: Diagnosis not present

## 2020-08-15 DIAGNOSIS — E785 Hyperlipidemia, unspecified: Secondary | ICD-10-CM | POA: Diagnosis not present

## 2020-08-15 DIAGNOSIS — F2089 Other schizophrenia: Secondary | ICD-10-CM | POA: Diagnosis not present

## 2020-08-15 DIAGNOSIS — N183 Chronic kidney disease, stage 3 unspecified: Secondary | ICD-10-CM | POA: Diagnosis not present

## 2020-08-15 DIAGNOSIS — D631 Anemia in chronic kidney disease: Secondary | ICD-10-CM | POA: Diagnosis not present

## 2020-08-17 DIAGNOSIS — S72141A Displaced intertrochanteric fracture of right femur, initial encounter for closed fracture: Secondary | ICD-10-CM | POA: Diagnosis not present

## 2020-08-19 DIAGNOSIS — I739 Peripheral vascular disease, unspecified: Secondary | ICD-10-CM | POA: Diagnosis not present

## 2020-08-19 DIAGNOSIS — I82511 Chronic embolism and thrombosis of right femoral vein: Secondary | ICD-10-CM | POA: Diagnosis not present

## 2020-08-19 DIAGNOSIS — L89612 Pressure ulcer of right heel, stage 2: Secondary | ICD-10-CM | POA: Diagnosis not present

## 2020-08-19 DIAGNOSIS — D631 Anemia in chronic kidney disease: Secondary | ICD-10-CM | POA: Diagnosis not present

## 2020-08-19 DIAGNOSIS — I69351 Hemiplegia and hemiparesis following cerebral infarction affecting right dominant side: Secondary | ICD-10-CM | POA: Diagnosis not present

## 2020-08-19 DIAGNOSIS — I129 Hypertensive chronic kidney disease with stage 1 through stage 4 chronic kidney disease, or unspecified chronic kidney disease: Secondary | ICD-10-CM | POA: Diagnosis not present

## 2020-08-19 DIAGNOSIS — E785 Hyperlipidemia, unspecified: Secondary | ICD-10-CM | POA: Diagnosis not present

## 2020-08-19 DIAGNOSIS — F2089 Other schizophrenia: Secondary | ICD-10-CM | POA: Diagnosis not present

## 2020-08-19 DIAGNOSIS — N183 Chronic kidney disease, stage 3 unspecified: Secondary | ICD-10-CM | POA: Diagnosis not present

## 2020-08-20 DIAGNOSIS — M19071 Primary osteoarthritis, right ankle and foot: Secondary | ICD-10-CM | POA: Diagnosis not present

## 2020-08-20 DIAGNOSIS — I739 Peripheral vascular disease, unspecified: Secondary | ICD-10-CM | POA: Diagnosis not present

## 2020-08-20 DIAGNOSIS — L97521 Non-pressure chronic ulcer of other part of left foot limited to breakdown of skin: Secondary | ICD-10-CM | POA: Diagnosis not present

## 2020-08-21 ENCOUNTER — Other Ambulatory Visit: Payer: Self-pay

## 2020-08-21 ENCOUNTER — Other Ambulatory Visit: Payer: Medicare PPO | Admitting: Hospice

## 2020-08-21 DIAGNOSIS — Z515 Encounter for palliative care: Secondary | ICD-10-CM

## 2020-08-21 DIAGNOSIS — I69051 Hemiplegia and hemiparesis following nontraumatic subarachnoid hemorrhage affecting right dominant side: Secondary | ICD-10-CM

## 2020-08-21 NOTE — Progress Notes (Signed)
[facility] Consult Note Telephone: [phone]  Fax: [phone]  PATIENT NAME: Daniel [name] [address] [place] [address] [id number] (home)  DOB: 1946/06/04 MRN: [id number]  PRIMARY CARE PROVIDER:    Vivi Barrack, MD,  [address] [place] [address] [phone]  REFERRING PROVIDER:  Dr. Francee Piccolo   RESPONSIBLE PARTY: Self   Extended Emergency Contact Information Primary Emergency Contact: Marisa Severin [place] Phone: [phone] Relation: Sister  I met face to face with patient and family in home/facility.  RECOMMENDATIONS/PLAN:    Visit at the request of  Dr. Francee Piccolo for palliative consult. Visit consisted of building trust and discussions on Palliative Medicine as specialized medical care for people living with serious illness, aimed at facilitating better quality of life through symptoms relief, assisting with advance care plan and establishing goals of care.   Palliative care and hospice have similar goals of managing symptoms, promoting comfort, improving quality of life, and maintaining a person's dignity. However, palliative care may be offered during any phase of a serious illness, while hospice care is usually offered when a person is expected to live for 6 months or less.  His Sister Daniel [name] was present during meeting.  Advance Care Planning/Goals of Care: Goals include to maximize quality of life and symptom management. Our advance care planning conversation included a discussion about:    The value and importance of advance care planning  Experiences with loved ones who have been seriously ill or have died  Exploration of personal, cultural or spiritual beliefs that might influence medical decisions - Patient has [employer] of goals of care in the event of a sudden injury or illness  Identification and preparation of a healthcare agent  Review and  updating or creation of an  advance directive document  CODE STATUS: Extensive discussions today on CODE STATUS-implications and ramifications.  Patient affirms he is a FULL code, reiterating that he needs everything done to keep him alive.  GOALS OF CARE: Goals of care include to maximize quality of life and symptom management.  Follow up Palliative Care Visit: Palliative care will continue to follow for goals of care clarification and symptom management.  Follow-up in a month  Symptom Management: Chronic pressure ulcer right heel and foot followed by nursing from [facility] for dressing changes.  Patient also goes to wound care and hyperbaric center last visit 08/07/2020 and next visit scheduled for 08/31/2020.  Chart review of wound center report indicates no signs of active infection at this time instead improvement of wound bed.  Patient denies pain/discomfort, no fever, chills, nausea/vomiting.  Patient is compliant with his medications.  He has no complaints at this time and in no medical acuity.  NP encouraged ongoing care.  Patient can self propel his wheel chair, needs min assist in activities of daily living.  Palliative will continue to monitor for symptom management/decline and make recommendations as needed.  Family /Caregiver/Community Supports: Patient lives at home with his sister who is also involved in his care.  [employer] affiliations provide spiritual and call.  Welcare nursing following for right ankle/heel wound.   I spent 1 hour and 20 minutes providing this consultation; time iincludes time spent with patient/family, chart review, provider coordination,  and documentation. More than 50% of the time in this consultation was spent on coordinating communication  CHIEF COMPLAIN/HISTORY OF PRESENT ILLNESS:  Daniel [name] is a 74 y.o. male with multiple medical  problems including right hemiparesis related to stroke, pressure ulcer right heel and foot hypertension, PVD. Palliative Care was  asked to follow this patient by consultation request of Dr. Francee Piccolo  to help address advance care planning and goals of care. This is a follow up visit.  CODE STATUS: Full code  PPS: 40%  HOSPICE ELIGIBILITY/DIAGNOSIS: TBD  PAST MEDICAL HISTORY:  Past Medical History:  Diagnosis Date  . Hypertension   . Psychosis ([facility])   . Schizophrenia ([place]) 1974  . Stroke [facility]) 2010   Right Hemiparesis    SOCIAL HX:  Social History   Tobacco Use  . Smoking status: Current Every Day Smoker    Packs/day: 0.10    Types: Cigarettes  . Smokeless tobacco: Never Used  . Tobacco comment: thinking about.  Cutting back 2-3 cigs per day  Substance Use Topics  . Alcohol use: Not Currently    Alcohol/week: 0.0 standard drinks   FAMILY HX:  Family History  Problem Relation Age of Onset  . Hypertension Mother   . CVA Sister   . Breast cancer Sister   . Prostate cancer Brother     ALLERGIES: No Known Allergies   PERTINENT MEDICATIONS:  Outpatient Encounter Medications as of 08/21/2020  Medication Sig  . amLODipine (NORVASC) 10 MG tablet Take 1 tablet (10 mg total) by mouth daily.  [name] atorvastatin (LIPITOR) 40 MG tablet Take 1 tablet (40 mg total) by mouth daily.  . camphor-menthol (SARNA) lotion Apply 1 application topically as needed for itching.  . clopidogrel (PLAVIX) 75 MG tablet Take 1 tablet (75 mg total) by mouth daily.  [name] doxycycline (VIBRAMYCIN) 100 MG capsule Take 1 capsule (100 mg total) by mouth 2 (two) times daily.  . folic acid (FOLVITE) 1 MG tablet Take 1 tablet (1 mg total) by mouth daily.  [name] gabapentin (NEURONTIN) 100 MG capsule TAKE 1 CAPSULE BY MOUTH THREE TIMES A DAY  . HYDROcodone-acetaminophen (NORCO) 5-325 MG tablet Take 1 tablet by mouth every 4 (four) hours as needed for severe pain.  [name] lisinopril (ZESTRIL) 20 MG tablet Take 1 tablet (20 mg total) by mouth daily.  . metoprolol tartrate (LOPRESSOR) 100 MG tablet Take 1 tablet (100 mg total) by mouth 2 (two) times daily.    . Multiple Vitamins-Minerals (MULTIVITAMIN WITH MINERALS) tablet Take 1 tablet by mouth daily.  . polyethylene glycol (MIRALAX / GLYCOLAX) 17 g packet Take 17 g by mouth daily as needed for mild constipation.  . senna-docusate (SENOKOT-S) 8.6-50 MG tablet Take 1 tablet by mouth 2 (two) times daily. (Patient taking differently: Take 1 tablet by mouth 2 (two) times daily as needed for mild constipation or moderate constipation. )   No facility-administered encounter medications on file as of 08/21/2020.    PHYSICAL EXAM / ROS:  General: NAD, cooperative Cardiovascular: regular rate and rhythm, no chest pain reported Pulmonary: no cough, no shortness of breath, clear ant/post fields, normal respiratory effort Abdomen: soft, non tender, positive bowel sounds in all quadrants GU: denies dysuria, no suprapubic tenderness Extremities: Pressure ulcer to right ankle/foot.  Dressing intact clean and dry.  Working nursing following for dressing changes as ordered by wound care center. Skin: no rashes to exposed skin Neurological: Weakness but otherwise non focal; right side hemiparesis  Teodoro Spray, NP

## 2020-08-22 ENCOUNTER — Telehealth: Payer: Self-pay

## 2020-08-22 NOTE — Telephone Encounter (Signed)
FYI

## 2020-08-22 NOTE — Telephone Encounter (Signed)
Daniel [name] with [facility] called to let Dr. [name] know that they need to put pt.'s visits on hold because he has an infestation of bed bugs.

## 2020-08-28 ENCOUNTER — Ambulatory Visit (INDEPENDENT_AMBULATORY_CARE_PROVIDER_SITE_OTHER): Payer: Medicare PPO | Admitting: Family Medicine

## 2020-08-28 ENCOUNTER — Other Ambulatory Visit: Payer: Self-pay

## 2020-08-28 ENCOUNTER — Encounter: Payer: Self-pay | Admitting: Family Medicine

## 2020-08-28 VITALS — BP 176/80 | HR 62 | Temp 97.7°F | Ht 73.0 in

## 2020-08-28 DIAGNOSIS — Z23 Encounter for immunization: Secondary | ICD-10-CM | POA: Diagnosis not present

## 2020-08-28 DIAGNOSIS — N183 Chronic kidney disease, stage 3 unspecified: Secondary | ICD-10-CM

## 2020-08-28 DIAGNOSIS — R300 Dysuria: Secondary | ICD-10-CM

## 2020-08-28 DIAGNOSIS — E785 Hyperlipidemia, unspecified: Secondary | ICD-10-CM | POA: Diagnosis not present

## 2020-08-28 DIAGNOSIS — I129 Hypertensive chronic kidney disease with stage 1 through stage 4 chronic kidney disease, or unspecified chronic kidney disease: Secondary | ICD-10-CM

## 2020-08-28 NOTE — Assessment & Plan Note (Signed)
Above goal though has previously been well controlled.  Has home health monitoring blood pressure at home.  Continue amlodipine 10 mg daily, lisinopril 20 mg daily, and Metroprolol tartrate 100 mg twice daily.  Follow-up in 3 months.

## 2020-08-28 NOTE — Progress Notes (Signed)
   Daniel [name] is a 74 y.o. male who presents today for an office visit.  Assessment/Plan:  Benign hypertension with CKD (chronic kidney disease) stage III Above goal though has previously been well controlled.  Has home health monitoring blood pressure at home.  Continue amlodipine 10 mg daily, lisinopril 20 mg daily, and Metroprolol tartrate 100 mg twice daily.  Follow-up in 3 months.  Hyperlipidemia Last lipids at goal.  Continue Lipitor 40 mg daily.  Urinary frequency/dysuria Check urine culture.  No systemic signs of illness.  Likely has underlying BPH as well.  Will consider trial of Flomax if urine culture is negative.    Subjective:  HPI:  Patient here for 65-month follow-up.  Overall doing well.  Has had more frequent urination.  Occasional dysuria.  No fevers or chills.  No back pain.  He has been compliant with all his other medications without issue.       Objective:  Physical Exam: BP (!) 176/80   Pulse 62   Temp 97.7 F (36.5 C) (Temporal)   Ht 6\' 1"  (1.854 m)   SpO2 96%   BMI 17.42 kg/m   Gen: No acute distress, resting comfortably  CV: Regular rate and rhythm with no murmurs appreciated Pulm: Normal work of breathing, clear to auscultation bilaterally with no crackles, wheezes, or rhonchi Neuro: Grossly normal, moves all extremities Psych: Normal affect and thought content      Quentin Shorey M. [name], MD 08/28/2020 10:43 AM

## 2020-08-28 NOTE — Patient Instructions (Signed)
It was very nice to see you today!  We will give your flu shot today.  We will check a urine culture to make sure that you do not have a UTI.  If your urine looks normal we will start a medication to help with urination.  I will see you back in 3 to 6 months.  Take care, Dr [name]  Please try these tips to maintain a healthy lifestyle:   Eat at least 3 REAL meals and 1-2 snacks per day.  Aim for no more than 5 hours between eating.  If you eat breakfast, please do so within one hour of getting up.    Each meal should contain half fruits/vegetables, one quarter protein, and one quarter carbs (no bigger than a computer mouse)   Cut down on sweet beverages. This includes juice, soda, and sweet tea.     Drink at least 1 glass of water with each meal and aim for at least 8 glasses per day   Exercise at least 150 minutes every week.

## 2020-08-28 NOTE — Assessment & Plan Note (Addendum)
Last lipids at goal.  Continue Lipitor 40 mg daily.

## 2020-08-29 DIAGNOSIS — F2089 Other schizophrenia: Secondary | ICD-10-CM | POA: Diagnosis not present

## 2020-08-29 DIAGNOSIS — I129 Hypertensive chronic kidney disease with stage 1 through stage 4 chronic kidney disease, or unspecified chronic kidney disease: Secondary | ICD-10-CM | POA: Diagnosis not present

## 2020-08-29 DIAGNOSIS — N183 Chronic kidney disease, stage 3 unspecified: Secondary | ICD-10-CM | POA: Diagnosis not present

## 2020-08-29 DIAGNOSIS — I82511 Chronic embolism and thrombosis of right femoral vein: Secondary | ICD-10-CM | POA: Diagnosis not present

## 2020-08-29 DIAGNOSIS — I69351 Hemiplegia and hemiparesis following cerebral infarction affecting right dominant side: Secondary | ICD-10-CM | POA: Diagnosis not present

## 2020-08-29 DIAGNOSIS — E785 Hyperlipidemia, unspecified: Secondary | ICD-10-CM | POA: Diagnosis not present

## 2020-08-29 DIAGNOSIS — D631 Anemia in chronic kidney disease: Secondary | ICD-10-CM | POA: Diagnosis not present

## 2020-08-29 DIAGNOSIS — I739 Peripheral vascular disease, unspecified: Secondary | ICD-10-CM | POA: Diagnosis not present

## 2020-08-29 DIAGNOSIS — L89612 Pressure ulcer of right heel, stage 2: Secondary | ICD-10-CM | POA: Diagnosis not present

## 2020-08-29 LAB — URINALYSIS, ROUTINE W REFLEX MICROSCOPIC
Bacteria, UA: NONE SEEN /HPF
Bilirubin Urine: NEGATIVE
Glucose, UA: NEGATIVE
Hgb urine dipstick: NEGATIVE
Hyaline Cast: NONE SEEN /LPF
Ketones, ur: NEGATIVE
Leukocytes,Ua: NEGATIVE
Nitrite: NEGATIVE
RBC / HPF: NONE SEEN /HPF (ref 0–2)
Specific Gravity, Urine: 1.009 (ref 1.001–1.03)
Squamous Epithelial / HPF: NONE SEEN /HPF (ref <=5)
WBC, UA: NONE SEEN /HPF (ref 0–5)
pH: 7.5 (ref 5.0–8.0)

## 2020-08-29 LAB — URINE CULTURE
MICRO NUMBER:: 10982520
SPECIMEN QUALITY:: ADEQUATE

## 2020-09-02 DIAGNOSIS — I69351 Hemiplegia and hemiparesis following cerebral infarction affecting right dominant side: Secondary | ICD-10-CM | POA: Diagnosis not present

## 2020-09-02 DIAGNOSIS — L89612 Pressure ulcer of right heel, stage 2: Secondary | ICD-10-CM | POA: Diagnosis not present

## 2020-09-02 DIAGNOSIS — I129 Hypertensive chronic kidney disease with stage 1 through stage 4 chronic kidney disease, or unspecified chronic kidney disease: Secondary | ICD-10-CM | POA: Diagnosis not present

## 2020-09-02 DIAGNOSIS — F2089 Other schizophrenia: Secondary | ICD-10-CM | POA: Diagnosis not present

## 2020-09-02 DIAGNOSIS — N183 Chronic kidney disease, stage 3 unspecified: Secondary | ICD-10-CM | POA: Diagnosis not present

## 2020-09-02 DIAGNOSIS — I82511 Chronic embolism and thrombosis of right femoral vein: Secondary | ICD-10-CM | POA: Diagnosis not present

## 2020-09-02 DIAGNOSIS — I739 Peripheral vascular disease, unspecified: Secondary | ICD-10-CM | POA: Diagnosis not present

## 2020-09-02 DIAGNOSIS — D631 Anemia in chronic kidney disease: Secondary | ICD-10-CM | POA: Diagnosis not present

## 2020-09-02 DIAGNOSIS — E785 Hyperlipidemia, unspecified: Secondary | ICD-10-CM | POA: Diagnosis not present

## 2020-09-02 NOTE — Progress Notes (Signed)
Please inform patient of the following:  Urine culture shows no signs of UTI. Woud like for him to try flomax 0.4mg  daily to see if this helps with his urinary symptoms.  [name]. [name], MD 09/02/2020 9:32 PM

## 2020-09-03 ENCOUNTER — Other Ambulatory Visit: Payer: Self-pay

## 2020-09-03 MED ORDER — TAMSULOSIN HCL 0.4 MG PO CAPS: 0.4000 mg | ORAL_CAPSULE | Freq: Every day | ORAL | 3 refills | Status: DC

## 2020-09-05 DIAGNOSIS — I69351 Hemiplegia and hemiparesis following cerebral infarction affecting right dominant side: Secondary | ICD-10-CM | POA: Diagnosis not present

## 2020-09-05 DIAGNOSIS — N183 Chronic kidney disease, stage 3 unspecified: Secondary | ICD-10-CM | POA: Diagnosis not present

## 2020-09-05 DIAGNOSIS — L89612 Pressure ulcer of right heel, stage 2: Secondary | ICD-10-CM | POA: Diagnosis not present

## 2020-09-05 DIAGNOSIS — E785 Hyperlipidemia, unspecified: Secondary | ICD-10-CM | POA: Diagnosis not present

## 2020-09-05 DIAGNOSIS — F2089 Other schizophrenia: Secondary | ICD-10-CM | POA: Diagnosis not present

## 2020-09-05 DIAGNOSIS — I129 Hypertensive chronic kidney disease with stage 1 through stage 4 chronic kidney disease, or unspecified chronic kidney disease: Secondary | ICD-10-CM | POA: Diagnosis not present

## 2020-09-05 DIAGNOSIS — I739 Peripheral vascular disease, unspecified: Secondary | ICD-10-CM | POA: Diagnosis not present

## 2020-09-05 DIAGNOSIS — I82511 Chronic embolism and thrombosis of right femoral vein: Secondary | ICD-10-CM | POA: Diagnosis not present

## 2020-09-05 DIAGNOSIS — D631 Anemia in chronic kidney disease: Secondary | ICD-10-CM | POA: Diagnosis not present

## 2020-09-10 DIAGNOSIS — E785 Hyperlipidemia, unspecified: Secondary | ICD-10-CM | POA: Diagnosis not present

## 2020-09-10 DIAGNOSIS — F2089 Other schizophrenia: Secondary | ICD-10-CM | POA: Diagnosis not present

## 2020-09-10 DIAGNOSIS — I129 Hypertensive chronic kidney disease with stage 1 through stage 4 chronic kidney disease, or unspecified chronic kidney disease: Secondary | ICD-10-CM | POA: Diagnosis not present

## 2020-09-10 DIAGNOSIS — D631 Anemia in chronic kidney disease: Secondary | ICD-10-CM | POA: Diagnosis not present

## 2020-09-10 DIAGNOSIS — I739 Peripheral vascular disease, unspecified: Secondary | ICD-10-CM | POA: Diagnosis not present

## 2020-09-10 DIAGNOSIS — L89612 Pressure ulcer of right heel, stage 2: Secondary | ICD-10-CM | POA: Diagnosis not present

## 2020-09-10 DIAGNOSIS — I82511 Chronic embolism and thrombosis of right femoral vein: Secondary | ICD-10-CM | POA: Diagnosis not present

## 2020-09-10 DIAGNOSIS — N183 Chronic kidney disease, stage 3 unspecified: Secondary | ICD-10-CM | POA: Diagnosis not present

## 2020-09-10 DIAGNOSIS — I69351 Hemiplegia and hemiparesis following cerebral infarction affecting right dominant side: Secondary | ICD-10-CM | POA: Diagnosis not present

## 2020-09-11 ENCOUNTER — Encounter (HOSPITAL_BASED_OUTPATIENT_CLINIC_OR_DEPARTMENT_OTHER): Payer: Medicare PPO | Attending: Physician Assistant | Admitting: Physician Assistant

## 2020-09-11 ENCOUNTER — Other Ambulatory Visit: Payer: Self-pay

## 2020-09-11 DIAGNOSIS — I739 Peripheral vascular disease, unspecified: Secondary | ICD-10-CM | POA: Diagnosis not present

## 2020-09-11 DIAGNOSIS — L89613 Pressure ulcer of right heel, stage 3: Secondary | ICD-10-CM | POA: Insufficient documentation

## 2020-09-11 DIAGNOSIS — L89893 Pressure ulcer of other site, stage 3: Secondary | ICD-10-CM | POA: Diagnosis not present

## 2020-09-11 DIAGNOSIS — I10 Essential (primary) hypertension: Secondary | ICD-10-CM | POA: Diagnosis not present

## 2020-09-11 DIAGNOSIS — Z8673 Personal history of transient ischemic attack (TIA), and cerebral infarction without residual deficits: Secondary | ICD-10-CM | POA: Insufficient documentation

## 2020-09-11 NOTE — Progress Notes (Addendum)
Daniel [name] ([id number]) Visit Report for 09/11/2020 Chief Complaint Document Details Daniel [name]: Date of Service: Daniel Daniel [name] A. 09/11/2020 12:45 PM Medical Record Number: [id number] Daniel Account Number: [id number] Date of Birth/Sex: Treating RN: [birth year] (74 y.o. [name] Primary Care Provider: Dimas Chyle Other Clinician: Referring Provider: Treating Provider/Extender: Welford Roche Weeks in Treatment: 6 Information Obtained from: Daniel Chief Complaint Pressure ulcer right heel and foot Electronic Signature(s) Signed: 09/11/2020 1:32:18 PM By: Worthy Keeler PA-C Entered By: Worthy Keeler on 09/11/2020 13:32:18 -------------------------------------------------------------------------------- Daniel [name]: Date of Service: Daniel [name] A. 09/11/2020 12:45 PM Medical Record Number: [id number] Daniel Account Number: [id number] Date of Birth/Sex: Treating RN: 1946-02-02 (74 y.o. [name] Primary Care Provider: Dimas Chyle Other Clinician: Referring Provider: Treating Provider/Extender: Lebron Quam in Treatment: 6 Daniel Performed for Assessment: Wound #1 Right,Medial Calcaneus Performed By: Physician Worthy Keeler, PA Daniel Type: Daniel Level of Consciousness (Pre-procedure): Awake and Alert Pre-procedure Verification/Time Out Yes - 13:35 Taken: Start Time: 13:36 Pain Control: Other : benzocaine 20% spray T Area Debrided (L x W): otal 1 (cm) x 1.5 (cm) = 1.5 (cm) Tissue and other material debrided: Non-Viable, Callus, Skin: Epidermis Level: Skin/Epidermis Daniel Description: Selective/Open Wound Instrument: Curette Bleeding: Minimum Hemostasis Achieved: Pressure End Time: 13:40 Procedural Pain: 2 Post Procedural Pain: 0 Response to Treatment: Procedure was tolerated well Level of Consciousness (Post- Awake and Alert procedure): Post  Daniel Measurements of Total Wound Length: (cm) 0.3 Stage: Category/Stage III Width: (cm) 0.8 Depth: (cm) 0.1 Volume: (cm) 0.019 Character of Wound/Ulcer Post Daniel: Improved Post Procedure Diagnosis Same as Pre-procedure Electronic Signature(s) Signed: 09/11/2020 6:14:29 PM By: Baruch Gouty RN, BSN Signed: 09/11/2020 6:38:49 PM By: Worthy Keeler PA-C Entered By: Baruch Gouty on 09/11/2020 13:39:33 -------------------------------------------------------------------------------- HPI Details Daniel [name]: Date of Service: Daniel [name] A. 09/11/2020 12:45 PM Medical Record Number: [id number] Daniel Account Number: [id number] Date of Birth/Sex: Treating RN: 1946/01/29 (74 y.o. [name] Primary Care Provider: Dimas Chyle Other Clinician: Referring Provider: Treating Provider/Extender: Lebron Quam in Treatment: 6 History of Present Illness HPI Description: 07/31/2020 on evaluation today Daniel appears to be doing somewhat poorly and this is the first time that I am seeing him in regard to his heel. With that being said he seems to be making some progress although he has had a nurse coming out once a week to perform dressing changes they have been doing Daniel alginate for the top of his foot and Santyl for the heel. I really think he may be able to switch over to using Daniel alginate for the heel as well based on what I am seeing today. There does not appear to be any signs of active infection at this time which is great news. The Daniel does have a history of peripheral vascular disease today his ABI on the right is 0.69. He also has a history of hypertension and having had a stroke in the past with some residual unilateral weakness. Other than this overall he seems to be managing quite well all things considered. 08/07/2020 on evaluation today Daniel appears to be doing well with regard to his wounds. I do feel like there is some  improvement here. Fortunately there is no signs of active infection which is great news. No fevers, chills, nausea, vomiting, or diarrhea. Unfortunately is noted that he does have bedbugs today as well. He has home  health coming out so we will get a give him a chance to have this treated he states that he was aware of this and did have an exterminator out 2 weeks ago. Nonetheless he may need to call them back to have things evaluated. 09/11/2020 Daniel appears to be doing well with regard to his wound. He has been tolerating the dressing changes without complication. Fortunately there is no sign of active infection at this time. He does again have bedbugs here in the office today. Electronic Signature(s) Signed: 09/11/2020 1:41:57 PM By: Worthy Keeler PA-C Entered By: Worthy Keeler on 09/11/2020 13:41:57 -------------------------------------------------------------------------------- Physical Exam Details Daniel [name]: Date of Service: Daniel [name] A. 09/11/2020 12:45 PM Medical Record Number: [id number] Daniel Account Number: [id number] Date of Birth/Sex: Treating RN: [birth year] (74 y.o. [name] Primary Care Provider: Dimas Chyle Other Clinician: Referring Provider: Treating Provider/Extender: Welford Roche Weeks in Treatment: 6 Constitutional Well-nourished and well-hydrated in no acute distress. Respiratory normal breathing without difficulty. Psychiatric this Daniel is able to make decisions and demonstrates good insight into disease process. Alert and Oriented x 3. pleasant and cooperative. Notes Upon inspection Daniel's wound bed actually showed signs of good granulation at this time there does not appear to be any evidence of active infection which is great news and overall I am extremely pleased with where things stand. No fevers, chills, nausea, vomiting, or diarrhea. I did perform some sharp Daniel to clear away some of the dry skin on  the surface of the wound on the heel he tolerated that with minimal discomfort post Daniel wound bed is much smaller and appears to be doing much better than was even initially noted. Electronic Signature(s) Signed: 09/11/2020 1:42:27 PM By: Worthy Keeler PA-C Entered By: Worthy Keeler on 09/11/2020 13:42:26 -------------------------------------------------------------------------------- Physician Orders Details Daniel [name]: Date of Service: Daniel [name] A. 09/11/2020 12:45 PM Medical Record Number: [id number] Daniel Account Number: [id number] Date of Birth/Sex: Treating RN: 1945/12/17 (74 y.o. [name] Primary Care Provider: Dimas Chyle Other Clinician: Referring Provider: Treating Provider/Extender: Lebron Quam in Treatment: 6 Verbal / Phone Orders: No Diagnosis Coding ICD-10 Coding Code Description [phone] Pressure ulcer of right heel, stage 3 L89.893 Pressure ulcer of other site, stage 3 I73.89 Other specified peripheral vascular diseases I10 Essential (primary) hypertension Follow-up Appointments Return appointment in 1 month. Dressing Change Frequency Wound #1 Right,Medial Calcaneus Other: - 2 times per week Wound #2 Right,Lateral,Dorsal Foot Other: - 2 times per week Skin Barriers/Peri-Wound Care Moisturizing lotion - to leg and foot with dressing changes Wound Cleansing Clean wound with Wound Cleanser May shower with protection. Primary Wound Dressing Wound #1 Right,Medial Calcaneus Calcium Alginate with Daniel Wound #2 Right,Lateral,Dorsal Foot Other: - gauze to protect Secondary Dressing Wound #1 Right,Medial Calcaneus Dry Gauze Heel Cup Edema Control Kerlix and Coban - Right Lower Extremity - not too tight Avoid standing for long periods of time Elevate legs to the level of the heart or above for 30 minutes daily and/or when sitting, a frequency of: - throughout the day Exercise  regularly Off-Loading Other: - float heels with pillow under calves [facility] skilled nursing for wound care. Jackquline [place] Electronic Signature(s) Signed: 09/11/2020 6:14:29 PM By: Baruch Gouty RN, BSN Signed: 09/11/2020 6:38:49 PM By: Worthy Keeler PA-C Entered By: Baruch Gouty on 09/11/2020 13:41:53 -------------------------------------------------------------------------------- Problem List Details Daniel [name]: Date of Service: Daniel [name] A. 09/11/2020  12:45 PM Medical Record Number: [id number] Daniel Account Number: [id number] Date of Birth/Sex: Treating RN: 07-Aug-1946 (74 y.o. [name] Primary Care Provider: Dimas Chyle Other Clinician: Referring Provider: Treating Provider/Extender: Welford Roche Weeks in Treatment: 6 Active Problems ICD-10 Encounter Code Description Active Date MDM Diagnosis [phone] Pressure ulcer of right heel, stage 3 07/31/2020 No Yes L89.893 Pressure ulcer of other site, stage 3 07/31/2020 No Yes I73.89 Other specified peripheral vascular diseases 07/31/2020 No Yes I10 Essential (primary) hypertension 07/31/2020 No Yes Inactive Problems Resolved Problems Electronic Signature(s) Signed: 09/11/2020 1:32:13 PM By: Worthy Keeler PA-C Entered By: Worthy Keeler on 09/11/2020 13:32:13 -------------------------------------------------------------------------------- Progress Note Details Daniel [name]: Date of Service: Daniel [name] A. 09/11/2020 12:45 PM Medical Record Number: [id number] Daniel Account Number: [id number] Date of Birth/Sex: Treating RN: Dec 05, 1946 (74 y.o. [name] Primary Care Provider: Dimas Chyle Other Clinician: Referring Provider: Treating Provider/Extender: Lebron Quam in Treatment: 6 Subjective Chief Complaint Information obtained from Daniel Pressure ulcer right heel and foot History of Present Illness (HPI) 07/31/2020 on  evaluation today Daniel appears to be doing somewhat poorly and this is the first time that I am seeing him in regard to his heel. With that being said he seems to be making some progress although he has had a nurse coming out once a week to perform dressing changes they have been doing Daniel alginate for the top of his foot and Santyl for the heel. I really think he may be able to switch over to using Daniel alginate for the heel as well based on what I am seeing today. There does not appear to be any signs of active infection at this time which is great news. The Daniel does have a history of peripheral vascular disease today his ABI on the right is 0.69. He also has a history of hypertension and having had a stroke in the past with some residual unilateral weakness. Other than this overall he seems to be managing quite well all things considered. 08/07/2020 on evaluation today Daniel appears to be doing well with regard to his wounds. I do feel like there is some improvement here. Fortunately there is no signs of active infection which is great news. No fevers, chills, nausea, vomiting, or diarrhea. Unfortunately is noted that he does have bedbugs today as well. He has home health coming out so we will get a give him a chance to have this treated he states that he was aware of this and did have an exterminator out 2 weeks ago. Nonetheless he may need to call them back to have things evaluated. 09/11/2020 Daniel appears to be doing well with regard to his wound. He has been tolerating the dressing changes without complication. Fortunately there is no sign of active infection at this time. He does again have bedbugs here in the office today. Objective Constitutional Well-nourished and well-hydrated in no acute distress. Vitals Time Taken: 1:00 PM, Height: 69 in, Weight: 160 lbs, BMI: 23.6, Temperature: 97.9 F, Pulse: 55 bpm, Respiratory Rate: 16 breaths/min, Blood Pressure: 138/78  mmHg. Respiratory normal breathing without difficulty. Psychiatric this Daniel is able to make decisions and demonstrates good insight into disease process. Alert and Oriented x 3. pleasant and cooperative. General Notes: Upon inspection Daniel's wound bed actually showed signs of good granulation at this time there does not appear to be any evidence of active infection which is great news and overall I am extremely pleased with  where things stand. No fevers, chills, nausea, vomiting, or diarrhea. I did perform some sharp Daniel to clear away some of the dry skin on the surface of the wound on the heel he tolerated that with minimal discomfort post Daniel wound bed is much smaller and appears to be doing much better than was even initially noted. Integumentary (Hair, Skin) Wound #1 status is Open. Original cause of wound was Pressure Injury. The wound is located on the Right,Medial Calcaneus. The wound measures 0.6cm length x 1.2cm width x 0.1cm depth; 0.565cm^2 area and 0.057cm^3 volume. There is Fat Layer (Subcutaneous Tissue) exposed. There is no tunneling or undermining noted. There is a medium amount of serosanguineous drainage noted. The wound margin is flat and intact. There is medium (34-66%) pink granulation within the wound bed. There is a medium (34-66%) amount of necrotic tissue within the wound bed. Wound #2 status is Open. Original cause of wound was Gradually Appeared. The wound is located on the Right,Lateral,Dorsal Foot. The wound measures 0.2cm length x 0.2cm width x 0.1cm depth; 0.031cm^2 area and 0.003cm^3 volume. There is Fat Layer (Subcutaneous Tissue) exposed. There is no tunneling or undermining noted. There is a small amount of serosanguineous drainage noted. The wound margin is flat and intact. There is large (67-100%) pink granulation within the wound bed. There is no necrotic tissue within the wound bed. Assessment Active Problems ICD-10 Pressure ulcer of  right heel, stage 3 Pressure ulcer of other site, stage 3 Other specified peripheral vascular diseases Essential (primary) hypertension Procedures Wound #1 Pre-procedure diagnosis of Wound #1 is a Pressure Ulcer located on the Right,Medial Calcaneus . There was a Selective/Open Wound Skin/Epidermis Daniel with a total area of 1.5 sq cm performed by Worthy Keeler, PA. With the following instrument(s): Curette to remove Non-Viable tissue/material. Material removed includes Callus and Skin: Epidermis and after achieving pain control using Other (benzocaine 20% spray). No specimens were taken. A time out was conducted at 13:35, prior to the start of the procedure. A Minimum amount of bleeding was controlled with Pressure. The procedure was tolerated well with a pain level of 2 throughout and a pain level of 0 following the procedure. Post Daniel Measurements: 0.3cm length x 0.8cm width x 0.1cm depth; 0.019cm^3 volume. Post Daniel Stage noted as Category/Stage III. Character of Wound/Ulcer Post Daniel is improved. Post procedure Diagnosis Wound #1: Same as Pre-Procedure Plan Follow-up Appointments: Return appointment in 1 month. Dressing Change Frequency: Wound #1 Right,Medial Calcaneus: Other: - 2 times per week Wound #2 Right,Lateral,Dorsal Foot: Other: - 2 times per week Skin Barriers/Peri-Wound Care: Moisturizing lotion - to leg and foot with dressing changes Wound Cleansing: Clean wound with Wound Cleanser May shower with protection. Primary Wound Dressing: Wound #1 Right,Medial Calcaneus: Calcium Alginate with Daniel Wound #2 Right,Lateral,Dorsal Foot: Other: - gauze to protect Secondary Dressing: Wound #1 Right,Medial Calcaneus: Dry Gauze Heel Cup Edema Control: Kerlix and Coban - Right Lower Extremity - not too tight Avoid standing for long periods of time Elevate legs to the level of the heart or above for 30 minutes daily and/or when sitting, a  frequency of: - throughout the day Exercise regularly Off-Loading: Other: - float heels with pillow under calves Home Health: [facility] skilled nursing for wound care. - [facility] 1. I would recommend currently that we actually continue with the wound care measures as before with regard to the Curlex and Coban wrap to the right lower extremity and we will also continue with the Daniel  alginate and heel cup at this site. 2. Also can recommend the Daniel needs to continue to elevate his legs and offloading the heel in order to keep edema under control and prevent any pressure injury to the heel. We will see Daniel back for reevaluation in 4 weeks here in the clinic. If anything worsens or changes Daniel will contact our office for additional recommendations. Electronic Signature(s) Signed: 09/11/2020 1:43:09 PM By: Worthy Keeler PA-C Entered By: Worthy Keeler on 09/11/2020 13:43:09 -------------------------------------------------------------------------------- SuperBill Details Daniel [name]: Date of Service: Daniel RPER, [place] [name] A. 09/11/2020 Medical Record Number: [id number] Daniel Account Number: [id number] Date of Birth/Sex: Treating RN: 06-Jan-1946 (74 y.o. [name] Primary Care Provider: Dimas Chyle Other Clinician: Referring Provider: Treating Provider/Extender: Welford Roche Weeks in Treatment: 6 Diagnosis Coding ICD-10 Codes Code Description [phone] Pressure ulcer of right heel, stage 3 L89.893 Pressure ulcer of other site, stage 3 I73.89 Other specified peripheral vascular diseases I10 Essential (primary) hypertension Facility Procedures CPT4 Code: [id number] Description: 7597 - DEBRIDE WOUND 1ST 20 SQ CM OR < ICD-10 Diagnosis Description L89.613 Pressure ulcer of right heel, stage 3 Modifier: Quantity: 1 Physician Procedures : CPT4 Code Description Modifier [id number] - WC PHYS DEBR WO ANESTH 20 SQ CM ICD-10 Diagnosis  Description L89.613 Pressure ulcer of right heel, stage 3 Quantity: 1 Electronic Signature(s) Signed: 09/11/2020 1:44:48 PM By: Worthy Keeler PA-C Entered By: Worthy Keeler on 09/11/2020 13:44:48

## 2020-09-12 DIAGNOSIS — E785 Hyperlipidemia, unspecified: Secondary | ICD-10-CM | POA: Diagnosis not present

## 2020-09-12 DIAGNOSIS — D631 Anemia in chronic kidney disease: Secondary | ICD-10-CM | POA: Diagnosis not present

## 2020-09-12 DIAGNOSIS — I129 Hypertensive chronic kidney disease with stage 1 through stage 4 chronic kidney disease, or unspecified chronic kidney disease: Secondary | ICD-10-CM | POA: Diagnosis not present

## 2020-09-12 DIAGNOSIS — F2089 Other schizophrenia: Secondary | ICD-10-CM | POA: Diagnosis not present

## 2020-09-12 DIAGNOSIS — I739 Peripheral vascular disease, unspecified: Secondary | ICD-10-CM | POA: Diagnosis not present

## 2020-09-12 DIAGNOSIS — I69351 Hemiplegia and hemiparesis following cerebral infarction affecting right dominant side: Secondary | ICD-10-CM | POA: Diagnosis not present

## 2020-09-12 DIAGNOSIS — I82511 Chronic embolism and thrombosis of right femoral vein: Secondary | ICD-10-CM | POA: Diagnosis not present

## 2020-09-12 DIAGNOSIS — L89612 Pressure ulcer of right heel, stage 2: Secondary | ICD-10-CM | POA: Diagnosis not present

## 2020-09-12 DIAGNOSIS — N183 Chronic kidney disease, stage 3 unspecified: Secondary | ICD-10-CM | POA: Diagnosis not present

## 2020-09-13 NOTE — Progress Notes (Signed)
HAVOC, SANLUIS ([id number]) Visit Report for 09/11/2020 Arrival Information Details Patient Name: Date of Service: Daniel [name] MES A. 09/11/2020 12:45 PM Medical Record Number: [id number] Patient Account Number: [id number] Date of Birth/Sex: Treating RN: 1946-01-13 (74 y.o. [name] Primary Care Aydon Swamy: Dimas Chyle Other Clinician: Referring Lus Kriegel: Treating Hien Perreira/Extender: Lebron Quam in Treatment: 6 Visit Information History Since Last Visit Added or deleted any medications: No Patient Arrived: Wheel Chair Any new allergies or adverse reactions: No Arrival Time: 12:59 Had a fall or experienced change in No Accompanied By: self activities of daily living that may affect Transfer Assistance: None risk of falls: Patient Identification Verified: Yes Signs or symptoms of abuse/neglect since last visito No Secondary Verification Process Completed: Yes Hospitalized since last visit: No Patient Requires Transmission-Based Precautions: No Implantable device outside of the clinic excluding No Patient Has Alerts: Yes cellular tissue based products placed in the center Patient Alerts: Patient on Blood Thinner since last visit: Has Dressing in Place as Prescribed: Yes Pain Present Now: No Electronic Signature(s) Signed: 09/12/2020 1:11:06 PM By: Sandre Kitty Entered By: Sandre Kitty on 09/11/2020 12:59:24 -------------------------------------------------------------------------------- Encounter Discharge Information Details Patient Name: Date of Service: Daniel RPER, [place] MES A. 09/11/2020 12:45 PM Medical Record Number: [id number] Patient Account Number: [id number] Date of Birth/Sex: Treating RN: 1946-04-22 (73 y.o. Jerilynn Mages) Carlene Coria Primary Care Sahana Boyland: Dimas Chyle Other Clinician: Referring Keaisha Sublette: Treating Adrine Hayworth/Extender: Welford Roche Weeks in Treatment: 6 Encounter Discharge Information Items Post Procedure  Vitals Discharge Condition: Stable Temperature (F): 97.8 Ambulatory Status: Wheelchair Pulse (bpm): 55 Discharge Destination: Home Respiratory Rate (breaths/min): 16 Transportation: Private Auto Blood Pressure (mmHg): 138/78 Accompanied By: self Schedule Follow-up Appointment: Yes Clinical Summary of Care: Patient Declined Electronic Signature(s) Signed: 09/11/2020 5:53:32 PM By: Carlene Coria RN Entered By: Carlene Coria on 09/11/2020 14:02:04 -------------------------------------------------------------------------------- Lower Extremity Assessment Details Patient Name: Date of Service: Daniel RPER, Daniel [name] MES A. 09/11/2020 12:45 PM Medical Record Number: [id number] Patient Account Number: [id number] Date of Birth/Sex: Treating RN: 1946/10/04 (74 y.o. Janyth Contes Primary Care Shalon Salado: Dimas Chyle Other Clinician: Referring Cheyanne Lamison: Treating Lanetra Hartley/Extender: Welford Roche Weeks in Treatment: 6 Edema Assessment Assessed: [Left: No] [Right: No] Edema: [Left: Ye] [Right: s] Calf Left: Right: Point of Measurement: 31 cm From Medial Instep 32 cm Ankle Left: Right: Point of Measurement: 11 cm From Medial Instep 22 cm Vascular Assessment Pulses: Dorsalis Pedis Palpable: [Right:Yes] Electronic Signature(s) Signed: 09/13/2020 5:41:32 PM By: Levan Hurst RN, BSN Entered By: Levan Hurst on 09/11/2020 13:07:45 -------------------------------------------------------------------------------- [place] Details Patient Name: Date of Service: Daniel RPER, [place] MES A. 09/11/2020 12:45 PM Medical Record Number: [id number] Patient Account Number: [id number] Date of Birth/Sex: Treating RN: June 30, 1946 (74 y.o. [name] Primary Care Nasia Cannan: Dimas Chyle Other Clinician: Referring Korban Shearer: Treating Allin Frix/Extender: Lebron Quam in Treatment: 6 Active Inactive Pressure Nursing Diagnoses: Knowledge deficit  related to management of pressures ulcers Goals: Patient/caregiver will verbalize understanding of pressure ulcer management Date Initiated: 07/31/2020 Target Resolution Date: 10/09/2020 Goal Status: Active Interventions: Assess: immobility, friction, shearing, incontinence upon admission and as needed Assess offloading mechanisms upon admission and as needed Notes: Wound/Skin Impairment Nursing Diagnoses: Impaired tissue integrity Knowledge deficit related to ulceration/compromised skin integrity Goals: Patient/caregiver will verbalize understanding of skin care regimen Date Initiated: 07/31/2020 Target Resolution Date: 10/09/2020 Goal Status: Active Ulcer/skin breakdown will have a volume reduction of 30% by week 4 Date Initiated: 07/31/2020 Date Inactivated: 09/11/2020 Target  Resolution Date: 08/28/2020 Goal Status: Met Ulcer/skin breakdown will have a volume reduction of 80% by week 12 Date Initiated: 09/11/2020 Target Resolution Date: 10/09/2020 Goal Status: Active Interventions: Assess patient/caregiver ability to obtain necessary supplies Assess patient/caregiver ability to perform ulcer/skin care regimen upon admission and as needed Assess ulceration(s) every visit Provide education on ulcer and skin care Treatment Activities: Skin care regimen initiated : 07/31/2020 Topical wound management initiated : 07/31/2020 Notes: Electronic Signature(s) Signed: 09/11/2020 6:14:29 PM By: Baruch Gouty RN, BSN Entered By: Baruch Gouty on 09/11/2020 13:35:23 -------------------------------------------------------------------------------- Pain Assessment Details Patient Name: Date of Service: Daniel RPER, [place] MES A. 09/11/2020 12:45 PM Medical Record Number: [id number] Patient Account Number: [id number] Date of Birth/Sex: Treating RN: June 09, 1946 (74 y.o. [name] Primary Care : Dimas Chyle Other Clinician: Referring : Treating /Extender: Welford Roche Weeks in Treatment: 6 Active Problems Location of Pain Severity and Description of Pain Patient Has Paino No Site Locations Pain Management and Medication Current Pain Management: Electronic Signature(s) Signed: 09/11/2020 6:14:29 PM By: Baruch Gouty RN, BSN Signed: 09/12/2020 1:11:06 PM By: Sandre Kitty Entered By: Sandre Kitty on 09/11/2020 13:00:25 -------------------------------------------------------------------------------- Patient/Caregiver Education Details Patient Name: Date of Service: Daniel [name] MES A. 10/6/2021andnbsp12:45 PM Medical Record Number: [id number] Patient Account Number: [id number] Date of Birth/Gender: Treating RN: [birth year] (74 y.o. [name] Primary Care Physician: Dimas Chyle Other Clinician: Referring Physician: Treating Physician/Extender: Lebron Quam in Treatment: 6 Education Assessment Education Provided To: Patient Education Topics Provided Pressure: Methods: Explain/Verbal Responses: Reinforcements needed, State content correctly Wound/Skin Impairment: Methods: Explain/Verbal Responses: Reinforcements needed, State content correctly Electronic Signature(s) Signed: 09/11/2020 6:14:29 PM By: Baruch Gouty RN, BSN Entered By: Baruch Gouty on 09/11/2020 13:36:25 -------------------------------------------------------------------------------- Wound Assessment Details Patient Name: Date of Service: Daniel RPER, Daniel [name] MES A. 09/11/2020 12:45 PM Medical Record Number: [id number] Patient Account Number: [id number] Date of Birth/Sex: Treating RN: Nov 06, 1946 (74 y.o. [name] Primary Care : Dimas Chyle Other Clinician: Referring : Treating /Extender: Welford Roche Weeks in Treatment: 6 Wound Status Wound Number: 1 Primary Etiology: Pressure Ulcer Wound Location: Right, Medial Calcaneus Wound Status: Open Wounding Event:  Pressure Injury Comorbid History: Hypertension, Peripheral Venous Disease Date Acquired: 04/06/2020 Weeks Of Treatment: 6 Clustered Wound: No Photos Photo Uploaded By: Mikeal Hawthorne on 09/12/2020 11:10:35 Wound Measurements Length: (cm) 0.6 Width: (cm) 1.2 Depth: (cm) 0.1 Area: (cm) 0.565 Volume: (cm) 0.057 % Reduction in Area: 80.2% % Reduction in Volume: 80.1% Epithelialization: Small (1-33%) Tunneling: No Undermining: No Wound Description Classification: Category/Stage III Wound Margin: Flat and Intact Exudate Amount: Medium Exudate Type: Serosanguineous Exudate Color: red, brown Foul Odor After Cleansing: No Slough/Fibrino No Wound Bed Granulation Amount: Medium (34-66%) Exposed Structure Granulation Quality: Pink Fascia Exposed: No Necrotic Amount: Medium (34-66%) Fat Layer (Subcutaneous Tissue) Exposed: Yes Tendon Exposed: No Muscle Exposed: No Joint Exposed: No Bone Exposed: No Treatment Notes Wound #1 (Right, Medial Calcaneus) 1. Cleanse With Wound Cleanser 3. Primary Dressing Applied Calcium Alginate Ag 4. Secondary Dressing Dry Gauze Heel Cup 6. Support Layer Holiday representative) Signed: 09/11/2020 6:14:29 PM By: Baruch Gouty RN, BSN Signed: 09/13/2020 5:41:32 PM By: Levan Hurst RN, BSN Entered By: Levan Hurst on 09/11/2020 13:08:47 -------------------------------------------------------------------------------- Wound Assessment Details Patient Name: Date of Service: Daniel RPER, [place] MES A. 09/11/2020 12:45 PM Medical Record Number: [id number] Patient Account Number: [id number] Date of Birth/Sex: Treating RN: [birth year] (74 y.o. [name] Primary Care : Dimas Chyle Other Clinician:  Referring Camila Maita: Treating Jaleel Allen/Extender: Welford Roche Weeks in Treatment: 6 Wound Status Wound Number: 2 Primary Etiology: Pressure Ulcer Wound Location: Right, Lateral, Dorsal Foot Wound  Status: Open Wounding Event: Gradually Appeared Comorbid History: Hypertension, Peripheral Venous Disease Date Acquired: 04/06/2020 Weeks Of Treatment: 6 Clustered Wound: No Photos Photo Uploaded By: Mikeal Hawthorne on 09/12/2020 11:10:35 Wound Measurements Length: (cm) 0.2 Width: (cm) 0.2 Depth: (cm) 0.1 Area: (cm) 0.031 Volume: (cm) 0.003 % Reduction in Area: 93% % Reduction in Volume: 93.2% Epithelialization: Large (67-100%) Tunneling: No Undermining: No Wound Description Classification: Category/Stage III Wound Margin: Flat and Intact Exudate Amount: Small Exudate Type: Serosanguineous Exudate Color: red, brown Foul Odor After Cleansing: No Slough/Fibrino No Wound Bed Granulation Amount: Large (67-100%) Exposed Structure Granulation Quality: Pink Fascia Exposed: No Necrotic Amount: None Present (0%) Fat Layer (Subcutaneous Tissue) Exposed: Yes Tendon Exposed: No Muscle Exposed: No Joint Exposed: No Bone Exposed: No Treatment Notes Wound #2 (Right, Lateral, Dorsal Foot) 1. Cleanse With Wound Cleanser 3. Primary Dressing Applied Calcium Alginate Ag 4. Secondary Dressing Dry Gauze Heel Cup 6. Support Layer Holiday representative) Signed: 09/11/2020 6:14:29 PM By: Baruch Gouty RN, BSN Signed: 09/13/2020 5:41:32 PM By: Levan Hurst RN, BSN Entered By: Levan Hurst on 09/11/2020 13:08:16 -------------------------------------------------------------------------------- [place] Details Patient Name: Date of Service: Daniel RPER, [place] MES A. 09/11/2020 12:45 PM Medical Record Number: [id number] Patient Account Number: [id number] Date of Birth/Sex: Treating RN: September 16, 1946 (73 y.o. [name] Primary Care Charnae Lill: Dimas Chyle Other Clinician: Referring Mackinsey Pelland: Treating Ash Mcelwain/Extender: Lebron Quam in Treatment: 6 Vital Signs Time Taken: 13:00 Temperature (F): 97.9 Height (in): 69 Pulse (bpm):  55 Weight (lbs): 160 Respiratory Rate (breaths/min): 16 Body Mass Index (BMI): 23.6 Blood Pressure (mmHg): 138/78 Reference Range: 80 - 120 mg / dl Electronic Signature(s) Signed: 09/12/2020 1:11:06 PM By: Sandre Kitty Entered By: Sandre Kitty on 09/11/2020 13:00:19

## 2020-09-16 DIAGNOSIS — I69351 Hemiplegia and hemiparesis following cerebral infarction affecting right dominant side: Secondary | ICD-10-CM | POA: Diagnosis not present

## 2020-09-16 DIAGNOSIS — D631 Anemia in chronic kidney disease: Secondary | ICD-10-CM | POA: Diagnosis not present

## 2020-09-16 DIAGNOSIS — I82511 Chronic embolism and thrombosis of right femoral vein: Secondary | ICD-10-CM | POA: Diagnosis not present

## 2020-09-16 DIAGNOSIS — E785 Hyperlipidemia, unspecified: Secondary | ICD-10-CM | POA: Diagnosis not present

## 2020-09-16 DIAGNOSIS — S72141A Displaced intertrochanteric fracture of right femur, initial encounter for closed fracture: Secondary | ICD-10-CM | POA: Diagnosis not present

## 2020-09-16 DIAGNOSIS — F2089 Other schizophrenia: Secondary | ICD-10-CM | POA: Diagnosis not present

## 2020-09-16 DIAGNOSIS — N183 Chronic kidney disease, stage 3 unspecified: Secondary | ICD-10-CM | POA: Diagnosis not present

## 2020-09-16 DIAGNOSIS — I129 Hypertensive chronic kidney disease with stage 1 through stage 4 chronic kidney disease, or unspecified chronic kidney disease: Secondary | ICD-10-CM | POA: Diagnosis not present

## 2020-09-16 DIAGNOSIS — L89612 Pressure ulcer of right heel, stage 2: Secondary | ICD-10-CM | POA: Diagnosis not present

## 2020-09-16 DIAGNOSIS — I739 Peripheral vascular disease, unspecified: Secondary | ICD-10-CM | POA: Diagnosis not present

## 2020-09-19 ENCOUNTER — Other Ambulatory Visit: Payer: Self-pay

## 2020-09-19 ENCOUNTER — Other Ambulatory Visit: Payer: Medicare PPO | Admitting: Hospice

## 2020-09-19 DIAGNOSIS — I739 Peripheral vascular disease, unspecified: Secondary | ICD-10-CM | POA: Diagnosis not present

## 2020-09-19 DIAGNOSIS — D631 Anemia in chronic kidney disease: Secondary | ICD-10-CM | POA: Diagnosis not present

## 2020-09-19 DIAGNOSIS — I69051 Hemiplegia and hemiparesis following nontraumatic subarachnoid hemorrhage affecting right dominant side: Secondary | ICD-10-CM

## 2020-09-19 DIAGNOSIS — L89612 Pressure ulcer of right heel, stage 2: Secondary | ICD-10-CM | POA: Diagnosis not present

## 2020-09-19 DIAGNOSIS — E785 Hyperlipidemia, unspecified: Secondary | ICD-10-CM | POA: Diagnosis not present

## 2020-09-19 DIAGNOSIS — I69351 Hemiplegia and hemiparesis following cerebral infarction affecting right dominant side: Secondary | ICD-10-CM | POA: Diagnosis not present

## 2020-09-19 DIAGNOSIS — Z515 Encounter for palliative care: Secondary | ICD-10-CM | POA: Diagnosis not present

## 2020-09-19 DIAGNOSIS — F2089 Other schizophrenia: Secondary | ICD-10-CM | POA: Diagnosis not present

## 2020-09-19 DIAGNOSIS — I129 Hypertensive chronic kidney disease with stage 1 through stage 4 chronic kidney disease, or unspecified chronic kidney disease: Secondary | ICD-10-CM | POA: Diagnosis not present

## 2020-09-19 DIAGNOSIS — N183 Chronic kidney disease, stage 3 unspecified: Secondary | ICD-10-CM | POA: Diagnosis not present

## 2020-09-19 DIAGNOSIS — I82511 Chronic embolism and thrombosis of right femoral vein: Secondary | ICD-10-CM | POA: Diagnosis not present

## 2020-09-19 NOTE — Progress Notes (Signed)
[facility] Palliative Care Consult Note Telephone: [phone]  Fax: [phone]  PATIENT NAME: Daniel [name] DOB: 11/26/1946 MRN: [id number]  PRIMARY CARE PROVIDER:   Vivi Barrack, MD Vivi Barrack, [address] [place],  [place] [address] REFERRING PROVIDER:  Dr. Francee Piccolo   RESPONSIBLE PARTY: Self   Extended Emergency Contact Information Primary Emergency Contact: [place] Phone: [phone] Relation: Sister  TELEHEALTH VISIT STATEMENT Due to the COVID-19 crisis, this visit was done via telephone from my office. It was initiated and consented to by this patient and/or family.   RECOMMENDATIONS/PLAN: ADVANCE care planning: Visit consisted of building trust and discussions on Palliative Medicine as specialized medical care for people living with serious illness, aimed at facilitating better quality of life through symptoms relief, assisting with advance care plan and establishing goals of care. CODE STATUS:   Patient affirms he is a FULL code, reiterating that he needs everything done to keep him alive.  GOALS OF CARE: Goals of care include to maximize quality of life and symptom management.  Follow up Palliative Care Visit: Palliative care will continue to follow for goals of care clarification and symptom management.  Follow-up in 3 months.  Symptom Management: Chronic pressure ulcer right heel/foot significantly improved according to patient.  He said he would likely be discharged soon from  nursing from [facility] for dressing changes. [name] reported patient is doing well with no concerns at this time.  Patient was last seen [facility] 09/11/2020.   Chart review of wound center report indicates no signs of active infection at this time instead improvement of wound bed.  Patient denies pain/discomfort, no fever, chills, nausea/vomiting.  Patient is compliant with his  medications.  He has no complaints at this time and in no medical acuity.   Patient can self propel his wheel chair, needs min assist in activities of daily living.  Palliative will continue to monitor for symptom management/decline and make recommendations as needed.  Family /Caregiver/Community Supports: Patient lives at home with his sister who is also involved in his care.  [employer] affiliations provide spiritual anchor and hope.  Welcare nursing following for right ankle/heel wound.   I spent 35 minutes providing this consultation; time iincludes time spent with patient/family, chart review, provider coordination,  and documentation. More than 50% of the time in this consultation was spent on coordinating communication  CHIEF COMPLAIN/HISTORY OF PRESENT ILLNESS:  Daniel [name] is a 74 y.o. male with multiple medical problems including right hemiparesis related to stroke, pressure ulcer right heel and foot hypertension, PVD. Palliative Care was asked to follow this patient by consultation request of Dr. Francee Piccolo  to help address advance care planning and goals of care. This is a follow up visit.  CODE STATUS: Full code  PPS: 40%  HOSPICE ELIGIBILITY/DIAGNOSIS: TBD  PAST MEDICAL HISTORY:  Past Medical History:  Diagnosis Date  . Hypertension   . Psychosis ([place])   . Schizophrenia ([place]) 1974  . Stroke [facility]) 2010   Right Hemiparesis    SOCIAL HX:  Social History   Tobacco Use  . Smoking status: Current Every Day Smoker    Packs/day: 0.10    Types: Cigarettes  . Smokeless tobacco: Never Used  . Tobacco comment: thinking about.  Cutting back 2-3 cigs per day  Substance Use Topics  . Alcohol use: Not Currently    Alcohol/week: 0.0 standard drinks  ALLERGIES: No Known Allergies   PERTINENT MEDICATIONS:  Outpatient Encounter Medications as of 09/19/2020  Medication Sig  . amLODipine (NORVASC) 10 MG tablet Take 1 tablet (10 mg total) by mouth daily.  [name]  atorvastatin (LIPITOR) 40 MG tablet Take 1 tablet (40 mg total) by mouth daily.  . camphor-menthol (SARNA) lotion Apply 1 application topically as needed for itching.  . clopidogrel (PLAVIX) 75 MG tablet Take 1 tablet (75 mg total) by mouth daily.  . folic acid (FOLVITE) 1 MG tablet Take 1 tablet (1 mg total) by mouth daily.  [name] gabapentin (NEURONTIN) 100 MG capsule TAKE 1 CAPSULE BY MOUTH THREE TIMES A DAY  . lisinopril (ZESTRIL) 20 MG tablet Take 1 tablet (20 mg total) by mouth daily.  . metoprolol tartrate (LOPRESSOR) 100 MG tablet Take 1 tablet (100 mg total) by mouth 2 (two) times daily.  . Multiple Vitamins-Minerals (MULTIVITAMIN WITH MINERALS) tablet Take 1 tablet by mouth daily.  . polyethylene glycol (MIRALAX / GLYCOLAX) 17 g packet Take 17 g by mouth daily as needed for mild constipation.  . senna-docusate (SENOKOT-S) 8.6-50 MG tablet Take 1 tablet by mouth 2 (two) times daily. (Patient taking differently: Take 1 tablet by mouth 2 (two) times daily as needed for mild constipation or moderate constipation. )  . tamsulosin (FLOMAX) 0.4 MG CAPS capsule Take 1 capsule (0.4 mg total) by mouth daily.   No facility-administered encounter medications on file as of 09/19/2020.     Teodoro Spray, NP

## 2020-10-02 ENCOUNTER — Telehealth: Payer: Self-pay | Admitting: Family Medicine

## 2020-10-02 NOTE — Telephone Encounter (Signed)
Left message for patient to call back and schedule Medicare Annual Wellness Visit (AWV) either virtually/audio only OR in office. Whatever the patients preference is.  No hx; please schedule at anytime with [facility]-Nurse Health Advisor at [facility].  This should be a 45 minute visit.   

## 2020-10-09 ENCOUNTER — Encounter (HOSPITAL_BASED_OUTPATIENT_CLINIC_OR_DEPARTMENT_OTHER): Payer: Medicare PPO | Attending: Physician Assistant | Admitting: Physician Assistant

## 2020-10-09 ENCOUNTER — Other Ambulatory Visit: Payer: Self-pay

## 2020-10-09 DIAGNOSIS — L89613 Pressure ulcer of right heel, stage 3: Secondary | ICD-10-CM | POA: Insufficient documentation

## 2020-10-09 DIAGNOSIS — L89893 Pressure ulcer of other site, stage 3: Secondary | ICD-10-CM | POA: Diagnosis not present

## 2020-10-09 DIAGNOSIS — I10 Essential (primary) hypertension: Secondary | ICD-10-CM | POA: Insufficient documentation

## 2020-10-09 DIAGNOSIS — I7389 Other specified peripheral vascular diseases: Secondary | ICD-10-CM | POA: Diagnosis not present

## 2020-10-09 NOTE — Progress Notes (Addendum)
JARI, DIPASQUALE ([id number]) Visit Report for 10/09/2020 Chief Complaint Document Details Patient Name: Date of Service: HA [name] MES A. 10/09/2020 12:45 PM Medical Record Number: [id number] Patient Account Number: [id number] Date of Birth/Sex: Treating RN: Jun 11, 1946 (74 y.o. [name] Primary Care Provider: Dimas Chyle Other Clinician: Referring Provider: Treating Provider/Extender: Welford Roche Weeks in Treatment: 10 Information Obtained from: Patient Chief Complaint Pressure ulcer right heel and foot Electronic Signature(s) Signed: 10/09/2020 1:24:38 PM By: Worthy Keeler PA-C Entered By: Worthy Keeler on 10/09/2020 13:24:37 -------------------------------------------------------------------------------- HPI Details Patient Name: Date of Service: [name], [name] 10/09/2020 12:45 PM Medical Record Number: [id number] Patient Account Number: [id number] Date of Birth/Sex: Treating RN: 1946/09/16 (74 y.o. [name] Primary Care Provider: Dimas Chyle Other Clinician: Referring Provider: Treating Provider/Extender: Lebron Quam in Treatment: 10 History of Present Illness HPI Description: 07/31/2020 on evaluation today patient appears to be doing somewhat poorly and this is the first time that I am seeing him in regard to his heel. With that being said he seems to be making some progress although he has had a nurse coming out once a week to perform dressing changes they have been doing silver alginate for the top of his foot and Santyl for the heel. I really think he may be able to switch over to using silver alginate for the heel as well based on what I am seeing today. There does not appear to be any signs of active infection at this time which is great news. The patient does have a history of peripheral vascular disease today his ABI on the right is 0.69. He also has a history of hypertension and having had a stroke in  the past with some residual unilateral weakness. Other than this overall he seems to be managing quite well all things considered. 08/07/2020 on evaluation today patient appears to be doing well with regard to his wounds. I do feel like there is some improvement here. Fortunately there is no signs of active infection which is great news. No fevers, chills, nausea, vomiting, or diarrhea. Unfortunately is noted that he does have bedbugs today as well. He has home health coming out so we will get a give him a chance to have this treated he states that he was aware of this and did have an exterminator out 2 weeks ago. Nonetheless he may need to call them back to have things evaluated. 09/11/2020 patient appears to be doing well with regard to his wound. He has been tolerating the dressing changes without complication. Fortunately there is no sign of active infection at this time. He does again have bedbugs here in the office today. 10/09/2020 on evaluation today patient appears to be doing well with regard to his wounds in fact both areas appear to be completely healed today. He still continues to have bedbugs. Electronic Signature(s) Signed: 10/09/2020 1:30:27 PM By: Worthy Keeler PA-C Entered By: Worthy Keeler on 10/09/2020 13:30:25 -------------------------------------------------------------------------------- Physical Exam Details Patient Name: Date of Service: [name], [name] 10/09/2020 12:45 PM Medical Record Number: [id number] Patient Account Number: [id number] Date of Birth/Sex: Treating RN: 11-17-1946 (74 y.o. [name] Primary Care Provider: Dimas Chyle Other Clinician: Referring Provider: Treating Provider/Extender: Welford Roche Weeks in Treatment: 10 Constitutional Well-nourished and well-hydrated in no acute distress. Respiratory normal breathing without difficulty. Psychiatric this patient is able to make decisions and demonstrates good insight  into  disease process. Alert and Oriented x 3. pleasant and cooperative. Notes Upon inspection patient's wound bed actually showed signs of complete epithelization at both locations which is great news and overall I feel like he is doing excellent I see no signs of new injury and in general I think the best thing at this point is can be for [place] to go ahead and discontinue wound care services as he does appear to be completely healed. I did explain to him he does continue to have bedbugs noted and I am going to recommend that he follow-up with the exterminator in order to treat these. Electronic Signature(s) Signed: 10/09/2020 1:30:59 PM By: Worthy Keeler PA-C Entered By: Worthy Keeler on 10/09/2020 13:30:59 -------------------------------------------------------------------------------- Physician Orders Details Patient Name: Date of Service: [name], [name] 10/09/2020 12:45 PM Medical Record Number: [id number] Patient Account Number: [id number] Date of Birth/Sex: Treating RN: 1945/12/26 (74 y.o. [name] Primary Care Provider: Dimas Chyle Other Clinician: Referring Provider: Treating Provider/Extender: Lebron Quam in Treatment: 10 Verbal / Phone Orders: No Diagnosis Coding ICD-10 Coding Code Description [phone] Pressure ulcer of right heel, stage 3 L89.893 Pressure ulcer of other site, stage 3 I73.89 Other specified peripheral vascular diseases I10 Essential (primary) hypertension Discharge From [facility] Services Discharge from [place] lotion - to both legs daily Edema Control Elevate legs to the level of the heart or above for 30 minutes daily and/or when sitting, a frequency of: - throughout the day Exercise regularly Off-Loading Other: - float heels with pillow under calves [place] [facility] - [facility] wound care Electronic Signature(s) Signed: 10/09/2020 5:20:58 PM By:  Worthy Keeler PA-C Signed: 10/09/2020 5:51:45 PM By: Baruch Gouty RN, BSN Entered By: Baruch Gouty on 10/09/2020 13:30:15 -------------------------------------------------------------------------------- Problem List Details Patient Name: Date of Service: [name], [name] 10/09/2020 12:45 PM Medical Record Number: [id number] Patient Account Number: [id number] Date of Birth/Sex: Treating RN: 1946-04-05 (74 y.o. [name] Primary Care Provider: Dimas Chyle Other Clinician: Referring Provider: Treating Provider/Extender: Welford Roche Weeks in Treatment: 10 Active Problems ICD-10 Encounter Code Description Active Date MDM Diagnosis L89.613 Pressure ulcer of right heel, stage 3 07/31/2020 No Yes L89.893 Pressure ulcer of other site, stage 3 07/31/2020 No Yes I73.89 Other specified peripheral vascular diseases 07/31/2020 No Yes I10 Essential (primary) hypertension 07/31/2020 No Yes Inactive Problems Resolved Problems Electronic Signature(s) Signed: 10/09/2020 1:24:23 PM By: Worthy Keeler PA-C Entered By: Worthy Keeler on 10/09/2020 13:24:22 -------------------------------------------------------------------------------- Progress Note Details Patient Name: Date of Service: [name], [name] 10/09/2020 12:45 PM Medical Record Number: [id number] Patient Account Number: [id number] Date of Birth/Sex: Treating RN: 1946-10-16 (74 y.o. [name] Primary Care Provider: Dimas Chyle Other Clinician: Referring Provider: Treating Provider/Extender: Lebron Quam in Treatment: 10 Subjective Chief Complaint Information obtained from Patient Pressure ulcer right heel and foot History of Present Illness (HPI) 07/31/2020 on evaluation today patient appears to be doing somewhat poorly and this is the first time that I am seeing him in regard to his heel. With that being said he seems to be making some progress although he has  had a nurse coming out once a week to perform dressing changes they have been doing silver alginate for the top of his foot and Santyl for the heel. I really think he may be able to switch over to using silver alginate for the heel  as well based on what I am seeing today. There does not appear to be any signs of active infection at this time which is great news. The patient does have a history of peripheral vascular disease today his ABI on the right is 0.69. He also has a history of hypertension and having had a stroke in the past with some residual unilateral weakness. Other than this overall he seems to be managing quite well all things considered. 08/07/2020 on evaluation today patient appears to be doing well with regard to his wounds. I do feel like there is some improvement here. Fortunately there is no signs of active infection which is great news. No fevers, chills, nausea, vomiting, or diarrhea. Unfortunately is noted that he does have bedbugs today as well. He has home health coming out so we will get a give him a chance to have this treated he states that he was aware of this and did have an exterminator out 2 weeks ago. Nonetheless he may need to call them back to have things evaluated. 09/11/2020 patient appears to be doing well with regard to his wound. He has been tolerating the dressing changes without complication. Fortunately there is no sign of active infection at this time. He does again have bedbugs here in the office today. 10/09/2020 on evaluation today patient appears to be doing well with regard to his wounds in fact both areas appear to be completely healed today. He still continues to have bedbugs. Objective Constitutional Well-nourished and well-hydrated in no acute distress. Vitals Time Taken: 1:20 PM, Height: 69 in, Weight: 160 lbs, BMI: 23.6, Temperature: 98.1 F, Pulse: 73 bpm, Respiratory Rate: 16 breaths/min, Blood Pressure: 156/88 mmHg. Respiratory normal breathing  without difficulty. Psychiatric this patient is able to make decisions and demonstrates good insight into disease process. Alert and Oriented x 3. pleasant and cooperative. General Notes: Upon inspection patient's wound bed actually showed signs of complete epithelization at both locations which is great news and overall I feel like he is doing excellent I see no signs of new injury and in general I think the best thing at this point is can be for [place] to go ahead and discontinue wound care services as he does appear to be completely healed. I did explain to him he does continue to have bedbugs noted and I am going to recommend that he follow- up with the exterminator in order to treat these. Integumentary (Hair, Skin) Wound #1 status is Open. Original cause of wound was Pressure Injury. The wound is located on the Right,Medial Calcaneus. The wound measures 0cm length x 0cm width x 0cm depth; 0cm^2 area and 0cm^3 volume. There is no tunneling or undermining noted. There is a none present amount of drainage noted. The wound margin is flat and intact. There is no granulation within the wound bed. There is no necrotic tissue within the wound bed. Wound #2 status is Open. Original cause of wound was Gradually Appeared. The wound is located on the Right,Lateral,Dorsal Foot. The wound measures 0cm length x 0cm width x 0cm depth; 0cm^2 area and 0cm^3 volume. Assessment Active Problems ICD-10 Pressure ulcer of right heel, stage 3 Pressure ulcer of other site, stage 3 Other specified peripheral vascular diseases Essential (primary) hypertension Plan Discharge From [facility] Services: Discharge from [place] Skin Barriers/Peri-Wound Care: Moisturizing lotion - to both legs daily Edema Control: Elevate legs to the level of the heart or above for 30 minutes daily and/or when sitting, a frequency of: -  throughout the day Exercise regularly Off-Loading: Other: - float heels with pillow under  calves Home Health: [place] wound care 1. Would recommend that we discontinue wound care services at this point as the patient is completely healed. He is in agreement with that plan. 2. I am also can recommend at this time the patient continue to monitor for any signs of new injury. He does have some contracture of the end of his foot again I explained he need to follow-up with orthopedics for that. 3. With regard to offloading he needs to make sure to keep his feet and specifically his heel off of the ground to prevent any new wounds from occurring. We will see the patient back for follow-up visit as needed. Electronic Signature(s) Signed: 10/09/2020 1:31:44 PM By: Worthy Keeler PA-C Entered By: Worthy Keeler on 10/09/2020 13:31:44 -------------------------------------------------------------------------------- SuperBill Details Patient Name: Date of Service: [name], [name] 10/09/2020 Medical Record Number: [id number] Patient Account Number: [id number] Date of Birth/Sex: Treating RN: 05/19/1946 (74 y.o. [name] Primary Care Provider: Dimas Chyle Other Clinician: Referring Provider: Treating Provider/Extender: Welford Roche Weeks in Treatment: 10 Diagnosis Coding ICD-10 Codes Code Description [phone] Pressure ulcer of right heel, stage 3 L89.893 Pressure ulcer of other site, stage 3 I73.89 Other specified peripheral vascular diseases I10 Essential (primary) hypertension Facility Procedures CPT4 Code: [id number] Description: 99213 - WOUND CARE VISIT-LEV 3 EST PT Modifier: Quantity: 1 Physician Procedures : CPT4 Code Description Modifier [id number] - WC PHYS LEVEL 3 - EST PT ICD-10 Diagnosis Description L89.613 Pressure ulcer of right heel, stage 3 L89.893 Pressure ulcer of other site, stage 3 I73.89 Other specified peripheral vascular diseases I10  Essential (primary) hypertension Quantity: 1 Electronic  Signature(s) Signed: 10/09/2020 1:32:33 PM By: Worthy Keeler PA-C Entered By: Worthy Keeler on 10/09/2020 13:32:32

## 2020-10-10 NOTE — Progress Notes (Signed)
Daniel [name] ([id number]) Visit Report for 10/09/2020 Arrival Information Details Patient Name: Date of Service: Daniel [name] MES A. 10/09/2020 12:45 PM Medical Record Number: [id number] Patient Account Number: [id number] Date of Birth/Sex: Treating RN: 1945/12/23 (74 y.o. Daniel [name] Primary Care Daniel [name]: Daniel [name] Other Clinician: Referring Daniel [name]: Treating Daniel [name]/Extender: Daniel [name] in Treatment: 10 Visit Information History Since Last Visit Added or deleted any medications: No Patient Arrived: Wheel Chair Any new allergies or adverse reactions: No Arrival Time: 13:19 Had a fall or experienced change in No Accompanied By: self activities of daily living that may affect Transfer Assistance: None risk of falls: Patient Identification Verified: Yes Signs or symptoms of abuse/neglect since last visito No Secondary Verification Process Completed: Yes Hospitalized since last visit: No Patient Requires Transmission-Based Precautions: No Implantable device outside of the clinic excluding No Patient Has Alerts: Yes cellular tissue based products placed in the center Patient Alerts: Patient on Blood Thinner since last visit: Has Dressing in Place as Prescribed: Yes Pain Present Now: No Electronic Signature(s) Signed: 10/10/2020 9:07:27 AM By: Daniel [name] Entered By: Daniel [name] on 10/09/2020 13:20:05 -------------------------------------------------------------------------------- Clinic Level of Care Assessment Details Patient Name: Date of Service: Daniel Daniel [name], Daniel MES A. 10/09/2020 12:45 PM Medical Record Number: [id number] Patient Account Number: [id number] Date of Birth/Sex: Treating RN: [birth year] (74 y.o. Daniel [name] Primary Care Daniel [name]: Daniel [name] Other Clinician: Referring Stephannie Broner: Treating Khylei Wilms/Extender: Daniel [name] in Treatment: 10 Clinic Level of Care Assessment Items TOOL 4  Quantity Score []  - 0 Use when only an EandM is performed on FOLLOW-UP visit ASSESSMENTS - Nursing Assessment / Reassessment X- 1 10 Reassessment of Co-morbidities (includes updates in patient status) X- 1 5 Reassessment of Adherence to Treatment Plan ASSESSMENTS - Wound and Skin A ssessment / Reassessment X - Simple Wound Assessment / Reassessment - one wound 1 5 []  - 0 Complex Wound Assessment / Reassessment - multiple wounds []  - 0 Dermatologic / Skin Assessment (not related to wound area) ASSESSMENTS - Focused Assessment []  - 0 Circumferential Edema Measurements - multi extremities []  - 0 Nutritional Assessment / Counseling / Intervention X- 1 5 Lower Extremity Assessment (monofilament, tuning fork, pulses) []  - 0 Peripheral Arterial Disease Assessment (using hand held doppler) ASSESSMENTS - Ostomy and/or Continence Assessment and Care []  - 0 Incontinence Assessment and Management []  - 0 Ostomy Care Assessment and Management (repouching, etc.) PROCESS - Coordination of Care X - Simple Patient / Family Education for ongoing care 1 15 []  - 0 Complex (extensive) Patient / Family Education for ongoing care X- 1 10 Staff obtains Consents, Records, T Results / Process Orders est X- 1 10 Staff telephones HHA, Nursing Homes / Clarify orders / etc []  - 0 Routine Transfer to another Facility (non-emergent condition) []  - 0 Routine Hospital Admission (non-emergent condition) []  - 0 New Admissions / Biomedical engineer / Ordering NPWT Apligraf, etc. , []  - 0 Emergency Hospital Admission (emergent condition) X- 1 10 Simple Discharge Coordination []  - 0 Complex (extensive) Discharge Coordination PROCESS - Special Needs []  - 0 Pediatric / Minor Patient Management []  - 0 Isolation Patient Management []  - 0 Hearing / Language / Visual special needs []  - 0 Assessment of Community assistance (transportation, D/C planning, etc.) []  - 0 Additional assistance / Altered  mentation []  - 0 Support Surface(s) Assessment (bed, cushion, seat, etc.) INTERVENTIONS - Wound Cleansing / Measurement X - Simple Wound Cleansing - one wound 1 5 []  -  0 Complex Wound Cleansing - multiple wounds X- 1 5 Wound Imaging (photographs - any number of wounds) []  - 0 Wound Tracing (instead of photographs) []  - 0 Simple Wound Measurement - one wound []  - 0 Complex Wound Measurement - multiple wounds INTERVENTIONS - Wound Dressings []  - 0 Small Wound Dressing one or multiple wounds []  - 0 Medium Wound Dressing one or multiple wounds []  - 0 Large Wound Dressing one or multiple wounds []  - 0 Application of Medications - topical []  - 0 Application of Medications - injection INTERVENTIONS - Miscellaneous []  - 0 External ear exam []  - 0 Specimen Collection (cultures, biopsies, blood, body fluids, etc.) []  - 0 Specimen(s) / Culture(s) sent or taken to Lab for analysis []  - 0 Patient Transfer (multiple staff / Civil Service fast streamer / Similar devices) []  - 0 Simple Staple / Suture removal (25 or less) []  - 0 Complex Staple / Suture removal (26 or more) []  - 0 Hypo / Hyperglycemic Management (close monitor of Blood Glucose) []  - 0 Ankle / Brachial Index (ABI) - do not check if billed separately X- 1 5 Vital Signs Has the patient been seen at the hospital within the last three years: Yes Total Score: 85 Level Of Care: New/Established - Level 3 Electronic Signature(s) Signed: 10/09/2020 5:51:45 PM By: Daniel Gouty RN, BSN Entered By: Daniel [name] on 10/09/2020 13:25:45 -------------------------------------------------------------------------------- Encounter Discharge Information Details Patient Name: Date of Service: Daniel Daniel [name], Daniel MES A. 10/09/2020 12:45 PM Medical Record Number: [id number] Patient Account Number: [id number] Date of Birth/Sex: Treating RN: 02-14-1946 (74 y.o. Daniel [name] Primary Care Natassja Ollis: Daniel [name] Other Clinician: Referring  Skylarr Liz: Treating Simrah Chatham/Extender: Daniel [name] in Treatment: 10 Encounter Discharge Information Items Discharge Condition: Stable Ambulatory Status: Wheelchair Discharge Destination: Home Transportation: Other Accompanied By: self Schedule Follow-up Appointment: Yes Clinical Summary of Care: Patient Declined Notes transportation service Electronic Signature(s) Signed: 10/09/2020 5:51:45 PM By: Daniel Gouty RN, BSN Entered By: Daniel [name] on 10/09/2020 13:34:49 -------------------------------------------------------------------------------- Lower Extremity Assessment Details Patient Name: Date of Service: Daniel Daniel [name], Daniel MES A. 10/09/2020 12:45 PM Medical Record Number: [id number] Patient Account Number: [id number] Date of Birth/Sex: Treating RN: 09/29/1946 (74 y.o. Daniel [name] Primary Care Charlottie Peragine: Daniel [name] Other Clinician: Referring Phaedra Colgate: Treating Alajia Schmelzer/Extender: Welford Roche Weeks in Treatment: 10 Edema Assessment Assessed: [Left: No] [Right: No] Edema: [Left: Ye] [Right: s] Calf Left: Right: Point of Measurement: 31 cm From Medial Instep 32 cm Ankle Left: Right: Point of Measurement: 11 cm From Medial Instep 22 cm Vascular Assessment Pulses: Dorsalis Pedis Palpable: [Right:Yes] Electronic Signature(s) Signed: 10/09/2020 5:51:45 PM By: Daniel Gouty RN, BSN Entered By: Daniel [name] on 10/09/2020 13:28:18 -------------------------------------------------------------------------------- Multi-Disciplinary Care Plan Details Patient Name: Date of Service: Daniel Daniel [name], Daniel MES A. 10/09/2020 12:45 PM Medical Record Number: [id number] Patient Account Number: [id number] Date of Birth/Sex: Treating RN: July 23, 1946 (74 y.o. Daniel [name] Primary Care Sukaina Toothaker: Daniel [name] Other Clinician: Referring Petro Talent: Treating Vickie Ponds/Extender: Welford Roche Weeks in Treatment: 10 Active  Inactive Electronic Signature(s) Signed: 10/09/2020 5:51:45 PM By: Daniel Gouty RN, BSN Entered By: Daniel [name] on 10/09/2020 13:24:10 -------------------------------------------------------------------------------- Pain Assessment Details Patient Name: Date of Service: Daniel Daniel [name], Daniel MES A. 10/09/2020 12:45 PM Medical Record Number: [id number] Patient Account Number: [id number] Date of Birth/Sex: Treating RN: 29-Jun-1946 (74 y.o. Daniel [name] Primary Care Dontavion Noxon: Daniel [name] Other Clinician: Referring Kashus Karlen: Treating Saylor Murry/Extender: Welford Roche Weeks in Treatment: 10 Active Problems Location  of Pain Severity and Description of Pain Patient Has Paino No Site Locations Pain Management and Medication Current Pain Management: Electronic Signature(s) Signed: 10/09/2020 5:51:45 PM By: Daniel Gouty RN, BSN Signed: 10/10/2020 9:07:27 AM By: Daniel [name] Entered By: Daniel [name] on 10/09/2020 13:20:31 -------------------------------------------------------------------------------- Patient/Caregiver Education Details Patient Name: Date of Service: Daniel [name] MES A. 11/3/2021andnbsp12:45 PM Medical Record Number: [id number] Patient Account Number: [id number] Date of Birth/Gender: Treating RN: 16-Apr-1946 (74 y.o. Daniel [name] Primary Care Physician: Daniel [name] Other Clinician: Referring Physician: Treating Physician/Extender: Daniel [name] in Treatment: 10 Education Assessment Education Provided To: Patient Education Topics Provided Venous: Methods: Explain/Verbal Responses: Reinforcements needed, State content correctly Electronic Signature(s) Signed: 10/09/2020 5:51:45 PM By: Daniel Gouty RN, BSN Entered By: Daniel [name] on 10/09/2020 13:25:13 -------------------------------------------------------------------------------- Wound Assessment Details Patient Name: Date of Service: Daniel Daniel [name], Daniel  MES A. 10/09/2020 12:45 PM Medical Record Number: [id number] Patient Account Number: [id number] Date of Birth/Sex: Treating RN: 1945/12/13 (74 y.o. Daniel [name] Primary Care Makala Fetterolf: Daniel [name] Other Clinician: Referring Adalynne Steffensmeier: Treating Liseth Wann/Extender: Welford Roche Weeks in Treatment: 10 Wound Status Wound Number: 1 Primary Etiology: Pressure Ulcer Wound Location: Right, Medial Calcaneus Wound Status: Open Wounding Event: Pressure Injury Comorbid History: Hypertension, Peripheral Venous Disease Date Acquired: 04/06/2020 Weeks Of Treatment: 10 Clustered Wound: No Wound Measurements Length: (cm) Width: (cm) Depth: (cm) Area: (cm) Volume: (cm) 0 % Reduction in Area: 100% 0 % Reduction in Volume: 100% 0 Epithelialization: Large (67-100%) 0 Tunneling: No 0 Undermining: No Wound Description Classification: Category/Stage III Wound Margin: Flat and Intact Exudate Amount: None Present Foul Odor After Cleansing: No Slough/Fibrino No Wound Bed Granulation Amount: None Present (0%) Exposed Structure Necrotic Amount: None Present (0%) Fascia Exposed: No Fat Layer (Subcutaneous Tissue) Exposed: No Tendon Exposed: No Muscle Exposed: No Joint Exposed: No Bone Exposed: No Electronic Signature(s) Signed: 10/09/2020 5:51:45 PM By: Daniel Gouty RN, BSN Entered By: Daniel [name] on 10/09/2020 13:28:41 -------------------------------------------------------------------------------- Wound Assessment Details Patient Name: Date of Service: Daniel Daniel [name], Daniel MES A. 10/09/2020 12:45 PM Medical Record Number: [id number] Patient Account Number: [id number] Date of Birth/Sex: Treating RN: 1946/03/13 (74 y.o. Daniel [name] Primary Care Wendell Nicoson: Daniel [name] Other Clinician: Referring Maidie Streight: Treating Promiss Labarbera/Extender: Welford Roche Weeks in Treatment: 10 Wound Status Wound Number: 2 Primary Etiology: Pressure Ulcer Wound  Location: Right, Lateral, Dorsal Foot Wound Status: Open Wounding Event: Gradually Appeared Date Acquired: 04/06/2020 Weeks Of Treatment: 10 Clustered Wound: No Wound Measurements Length: (cm) 0 Width: (cm) 0 Depth: (cm) 0 Area: (cm) 0 Volume: (cm) 0 % Reduction in Area: 100% % Reduction in Volume: 100% Wound Description Classification: Category/Stage III Electronic Signature(s) Signed: 10/09/2020 5:51:45 PM By: Daniel Gouty RN, BSN Signed: 10/10/2020 9:07:27 AM By: Daniel [name] Entered By: Corky Crafts ns, Destiny on 10/09/2020 13:20:54 -------------------------------------------------------------------------------- Vitals Details Patient Name: Date of Service: Daniel Daniel [name], Daniel MES A. 10/09/2020 12:45 PM Medical Record Number: [id number] Patient Account Number: [id number] Date of Birth/Sex: Treating RN: 01/22/1946 (74 y.o. Daniel [name] Primary Care Savien Mamula: Daniel [name] Other Clinician: Referring Dreyah Montrose: Treating Ronnae Kaser/Extender: Daniel [name] in Treatment: 10 Vital Signs Time Taken: 13:20 Temperature (F): 98.1 Height (in): 69 Pulse (bpm): 73 Weight (lbs): 160 Respiratory Rate (breaths/min): 16 Body Mass Index (BMI): 23.6 Blood Pressure (mmHg): 156/88 Reference Range: 80 - 120 mg / dl Electronic Signature(s) Signed: 10/10/2020 9:07:27 AM By: Daniel [name] Entered By: Daniel [name] on 10/09/2020 13:20:25

## 2020-10-17 DIAGNOSIS — S72141A Displaced intertrochanteric fracture of right femur, initial encounter for closed fracture: Secondary | ICD-10-CM | POA: Diagnosis not present

## 2020-11-16 DIAGNOSIS — S72141A Displaced intertrochanteric fracture of right femur, initial encounter for closed fracture: Secondary | ICD-10-CM | POA: Diagnosis not present

## 2020-12-03 ENCOUNTER — Other Ambulatory Visit: Payer: Self-pay | Admitting: Family Medicine

## 2020-12-09 ENCOUNTER — Other Ambulatory Visit: Payer: Self-pay

## 2020-12-09 ENCOUNTER — Other Ambulatory Visit: Payer: Medicare PPO | Admitting: Hospice

## 2020-12-09 DIAGNOSIS — Z515 Encounter for palliative care: Secondary | ICD-10-CM

## 2020-12-09 DIAGNOSIS — I69051 Hemiplegia and hemiparesis following nontraumatic subarachnoid hemorrhage affecting right dominant side: Secondary | ICD-10-CM

## 2020-12-09 NOTE — Progress Notes (Signed)
[facility] Palliative Care Consult Note Telephone: [phone]  Fax: [phone]  PATIENT NAME: Daniel [name] DOB: 1946-08-25 MRN: [id number]  PRIMARY CARE PROVIDER:   Vivi Barrack, MD Vivi Barrack, [address] [place],  [place] [address]  REFERRING PROVIDER:Dr. Francee Piccolo  RESPONSIBLE PARTY:Self Extended Emergency Contact Information Primary Emergency Contact: [place] Phone: [phone] Relation: Sister  TELEHEALTH VISIT STATEMENT Due to the COVID-19 crisis, this visit was done via telephone from my office. It was initiated and consented to by this patient and/or family.   RECOMMENDATIONS/PLAN:  Visit consisted of building trust and follow up on palliative care.  Coping mechanisms: spirituality and family connectedness. ADVANCE care planning/CODE STATUS: Code status reviewed. Patient affirms he is a FULLcode,reiterating that he needs everything done to keep him alive.  GOALS OF CARE: Goals of care include to maximize quality of life and symptom management.  Follow up Palliative Care Visit: Palliative care will continue to follow for goals of care clarification and symptom management.Follow-up in 3 months.  Symptom Management:Chronic pressure ulcer right heel/foot resolved; patient discharged from wound care center and from [facility]. Patient in no acute distress, with no complain. [name] affirms patient is doing well; he continues to get around - self propelling his wheelchair. Patient is compliant with his medications. Palliative will continue to monitor for symptom management/decline and make recommendations as needed.  Family /Caregiver/Community Supports:Patient lives at home with his sister who is also involved in his care.[place] affiliations provide spiritual anchor and hope.   I spent46minutes providing this consultation; time iincludes time spent with  patient/family, chart review, provider coordination, and documentation. More than 50% of the time in this consultation was spent on coordinating communication  CHIEF COMPLAIN/HISTORY OF PRESENT ILLNESS:Daniel A Harperis a 75 y.o.malewith multiple medical problems including right hemiparesis related to stroke, pressure ulcer right heel and foot hypertension,PVD. Palliative Care was asked to follow this patient by consultation request ofDr. Herbert Moors help address advance care planning and goals of care. This is a follow up visit.  CODE STATUS:Full code  PPS:40%   HOSPICE ELIGIBILITY/DIAGNOSIS: TBD  PAST MEDICAL HISTORY:  Past Medical History:  Diagnosis Date  . Hypertension   . Psychosis ([place])   . Schizophrenia ([place]) 1974  . Stroke [facility]) 2010   Right Hemiparesis    SOCIAL HX:  Social History   Tobacco Use  . Smoking status: Current Every Day Smoker    Packs/day: 0.10    Types: Cigarettes  . Smokeless tobacco: Never Used  . Tobacco comment: thinking about.  Cutting back 2-3 cigs per day  Substance Use Topics  . Alcohol use: Not Currently    Alcohol/week: 0.0 standard drinks    ALLERGIES: No Known Allergies   PERTINENT MEDICATIONS:  Outpatient Encounter Medications as of 76/02/2021  Medication Sig  . amLODipine (NORVASC) 10 MG tablet Take 1 tablet (10 mg total) by mouth daily.  [name] atorvastatin (LIPITOR) 40 MG tablet Take 1 tablet (40 mg total) by mouth daily.  . camphor-menthol (SARNA) lotion Apply 1 application topically as needed for itching.  . clopidogrel (PLAVIX) 75 MG tablet Take 1 tablet (75 mg total) by mouth daily.  . folic acid (FOLVITE) 1 MG tablet Take 1 tablet (1 mg total) by mouth daily.  [name] gabapentin (NEURONTIN) 100 MG capsule TAKE 1 CAPSULE BY MOUTH THREE TIMES A DAY  . lisinopril (ZESTRIL) 20 MG tablet Take 1 tablet (20 mg total) by mouth daily.  [name]  metoprolol tartrate (LOPRESSOR) 100 MG tablet Take 1 tablet (100 mg total) by mouth 2 (two) times  daily.  . Multiple Vitamins-Minerals (MULTIVITAMIN WITH MINERALS) tablet Take 1 tablet by mouth daily.  . polyethylene glycol (MIRALAX / GLYCOLAX) 17 g packet Take 17 g by mouth daily as needed for mild constipation.  . senna-docusate (SENOKOT-S) 8.6-50 MG tablet Take 1 tablet by mouth 2 (two) times daily. (Patient taking differently: Take 1 tablet by mouth 2 (two) times daily as needed for mild constipation or moderate constipation. )  . tamsulosin (FLOMAX) 0.4 MG CAPS capsule TAKE 1 CAPSULE BY MOUTH EVERY DAY   No facility-administered encounter medications on file as of 12/09/2020.     Note:  Portions of this note were generated with [employer]. Dictation errors may occur despite attempts at proofreading.  Teodoro Spray, NP

## 2020-12-17 DIAGNOSIS — S72141A Displaced intertrochanteric fracture of right femur, initial encounter for closed fracture: Secondary | ICD-10-CM | POA: Diagnosis not present

## 2021-03-14 IMAGING — CT CT HEAD W/O CM
4 series · 16 of 47 positions shown, 18 images · non-contrast
Comparison: Head CT scan 12/11/2019.  Brain MRI 07/31/2009.

CLINICAL DATA: Left-sided weakness.

EXAM:
CT HEAD WITHOUT CONTRAST
TECHNIQUE: Contiguous axial images were obtained from the base of the skull
through the vertex without intravenous contrast.

[Series 2: head without · axial · non-contrast · 0.46mm/px · z∈[-114,+22]mm · 7 of 37 slices shown, 9 images]
[im 5/37  brain]
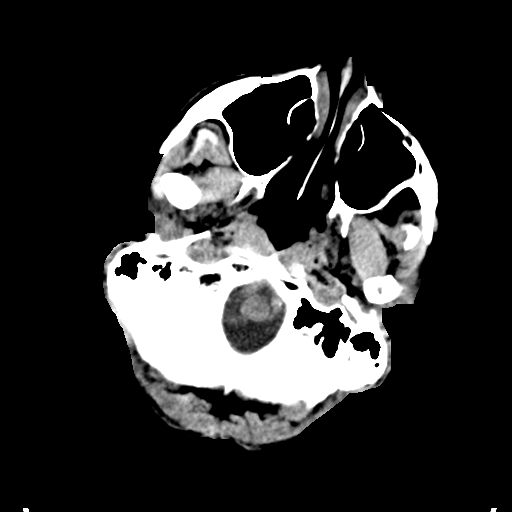
[im 5/37  bone]
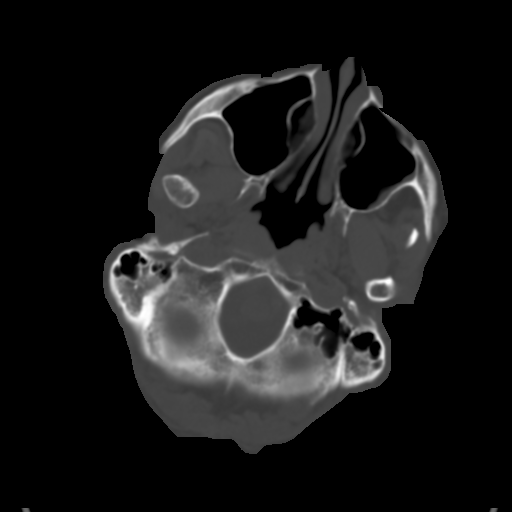
[im 10/37  brain]
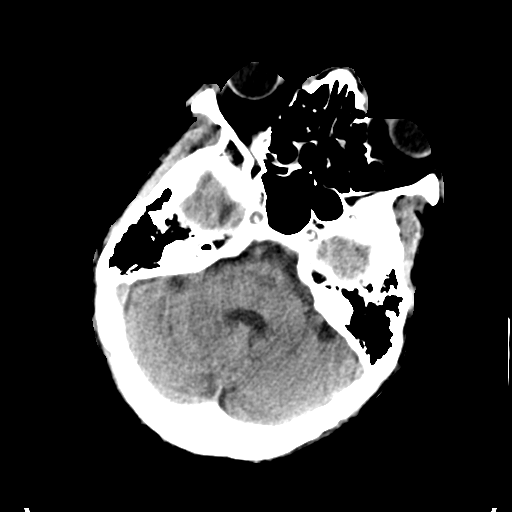
[im 14/37  brain]
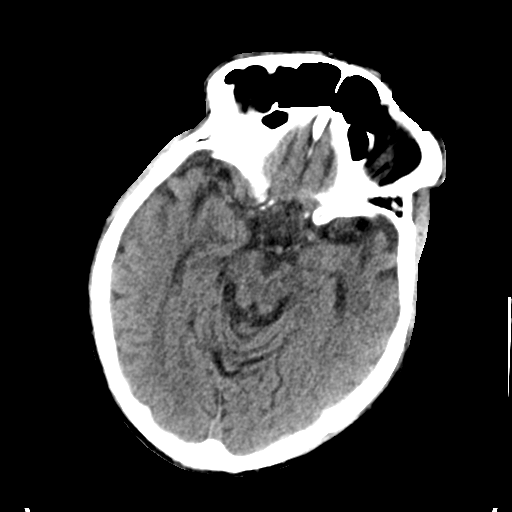
[im 19/37  brain]
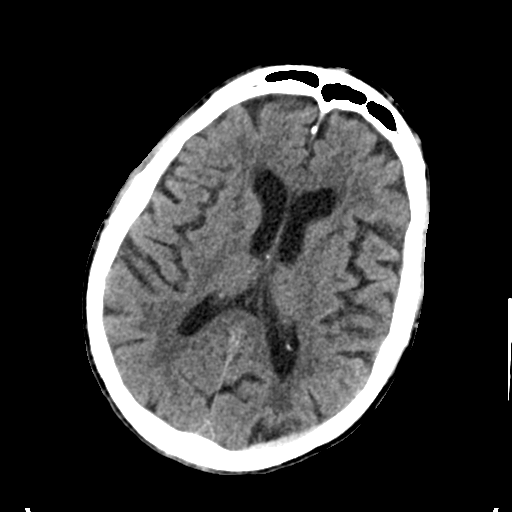
[im 23/37  brain]
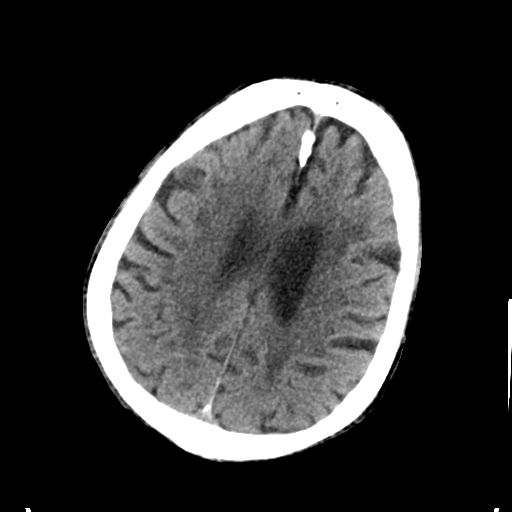
[im 23/37  bone]
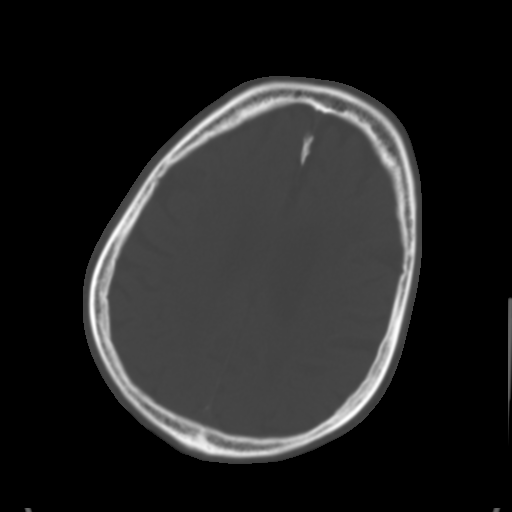
[im 28/37  brain]
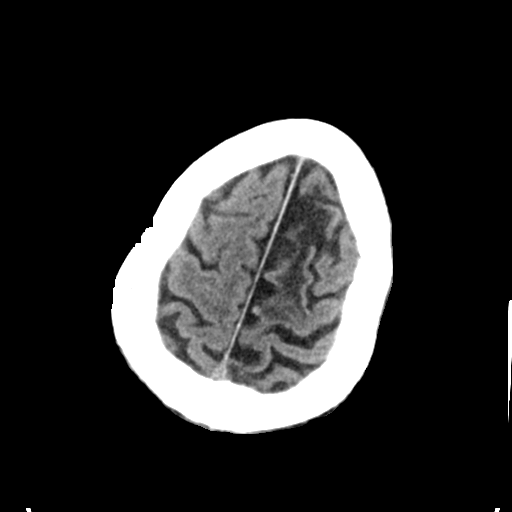
[im 32/37  brain]
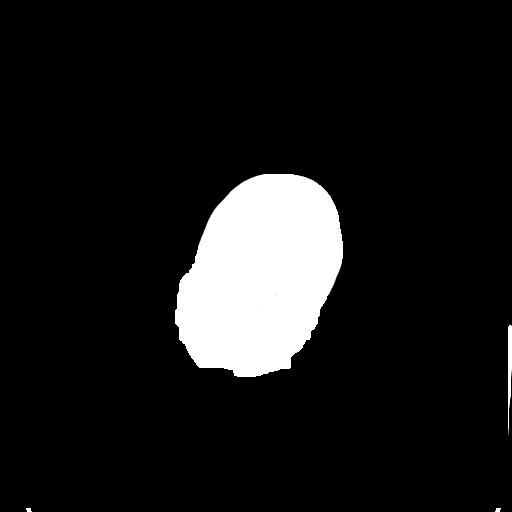

[Series 3: head bone · axial · 0.46mm/px · z∈[-116,-80]mm · 3 of 92 slices shown]
[im 10/92  bone]
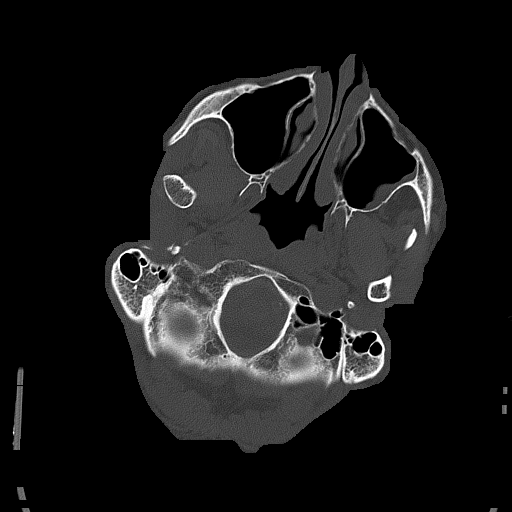
[im 19/92  bone]
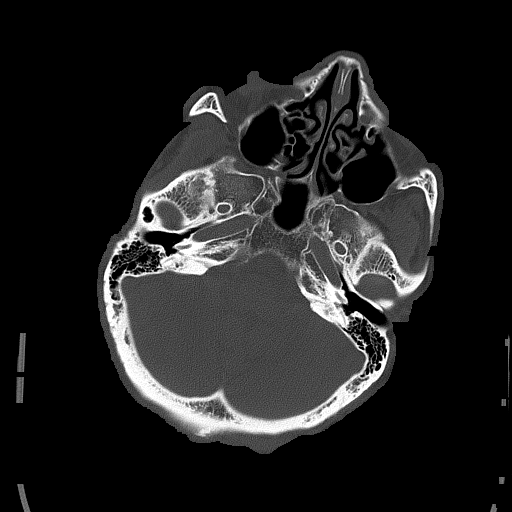
[im 28/92  bone]
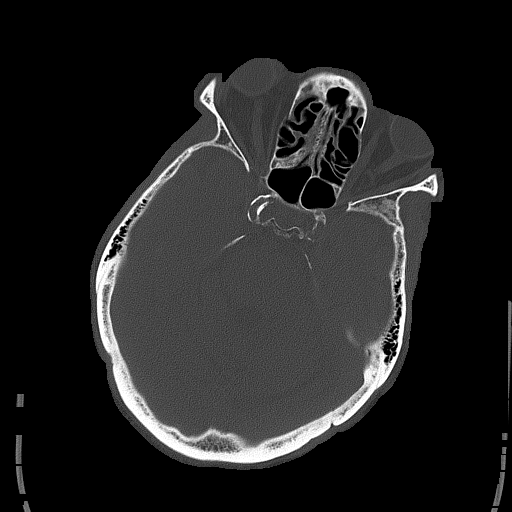

[Series 4: head without cor · coronal · non-contrast · 0.36mm/px · 3 of 77 slices shown]
[im 26/77  brain]
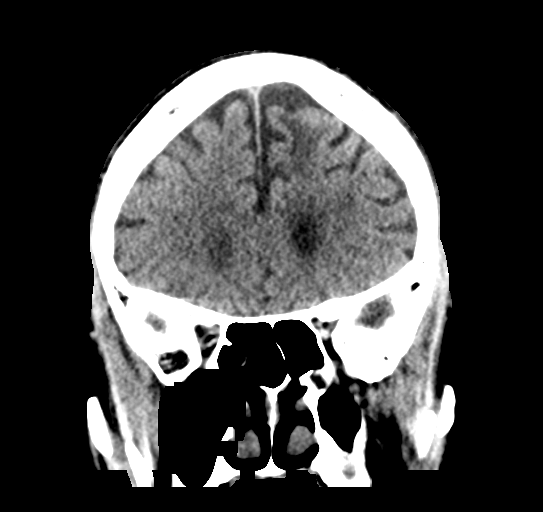
[im 34/77  brain]
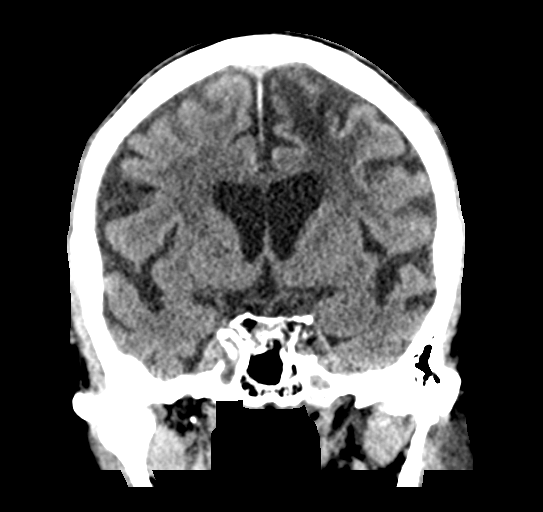
[im 43/77  brain]
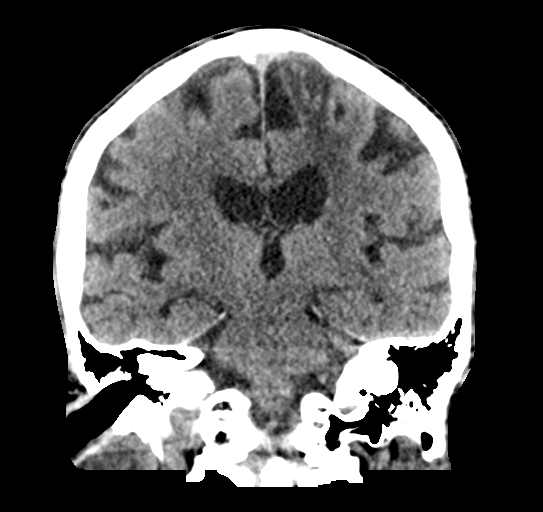

[Series 5: head without sag · sagittal · non-contrast · 0.36mm/px · 3 of 63 slices shown]
[im 21/63  brain]
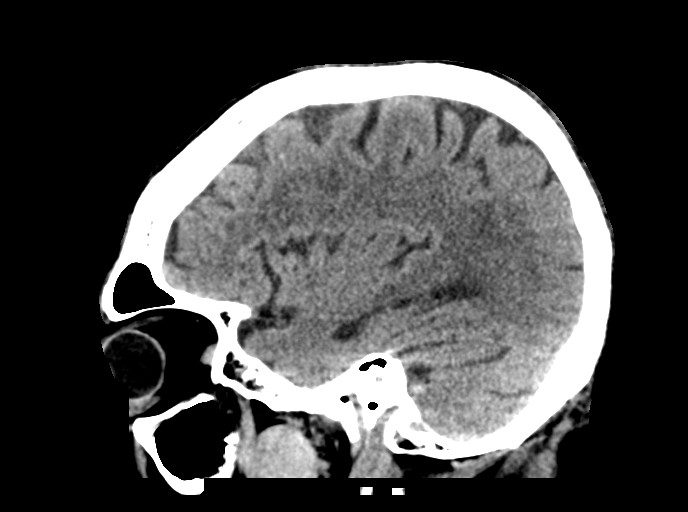
[im 32/63  brain]
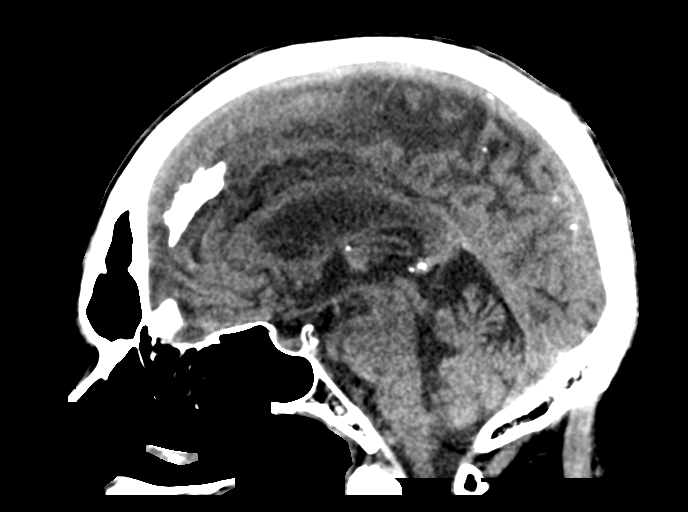
[im 42/63  brain]
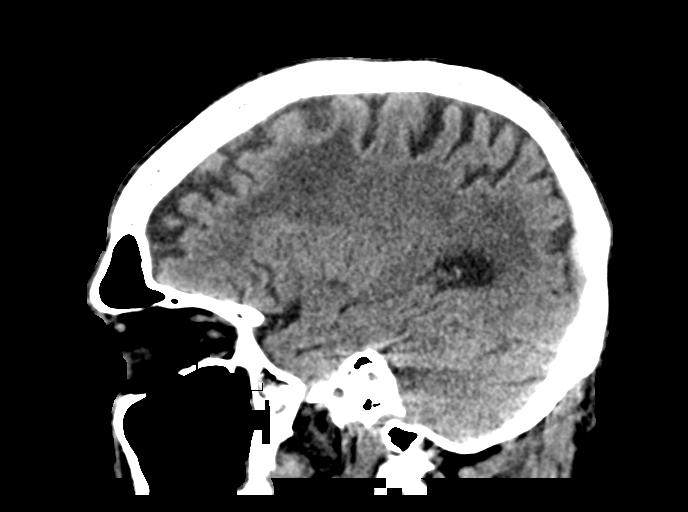

[16 of 47 positions shown; findings below may reference images not displayed]

FINDINGS: Brain: No evidence of acute infarction, hemorrhage, hydrocephalus,
extra-axial collection or mass lesion/mass effect. Remote left ACA
and left posterior watershed territory infarcts again seen. Atrophy
and chronic microvascular ischemic change noted.

Vascular: Atherosclerosis.

Skull: No fracture. Small area of increased density in the left
sphenoid wing is noted as per seen on the prior head CT.

Sinuses/Orbits: Mild mucosal thickening left maxillary sinus.

Other: None.
IMPRESSION: No acute abnormality or change compared to the prior exam.

Atrophy, chronic microvascular ischemic change remote left-sided
infarcts.

Atherosclerosis.

Mild mucosal thickening left maxillary sinus.

## 2021-03-19 ENCOUNTER — Other Ambulatory Visit: Payer: Self-pay | Admitting: Family Medicine

## 2021-03-26 IMAGING — CR DG HIP (WITH OR WITHOUT PELVIS) 2-3V*R*
3 series · 3 of 3 positions shown · non-contrast
Comparison: 12/12/2019, 12/11/2019

CLINICAL DATA: Status post hip replacement

EXAM:
DG HIP (WITH OR WITHOUT PELVIS) 2-3V RIGHT

[hip ap]
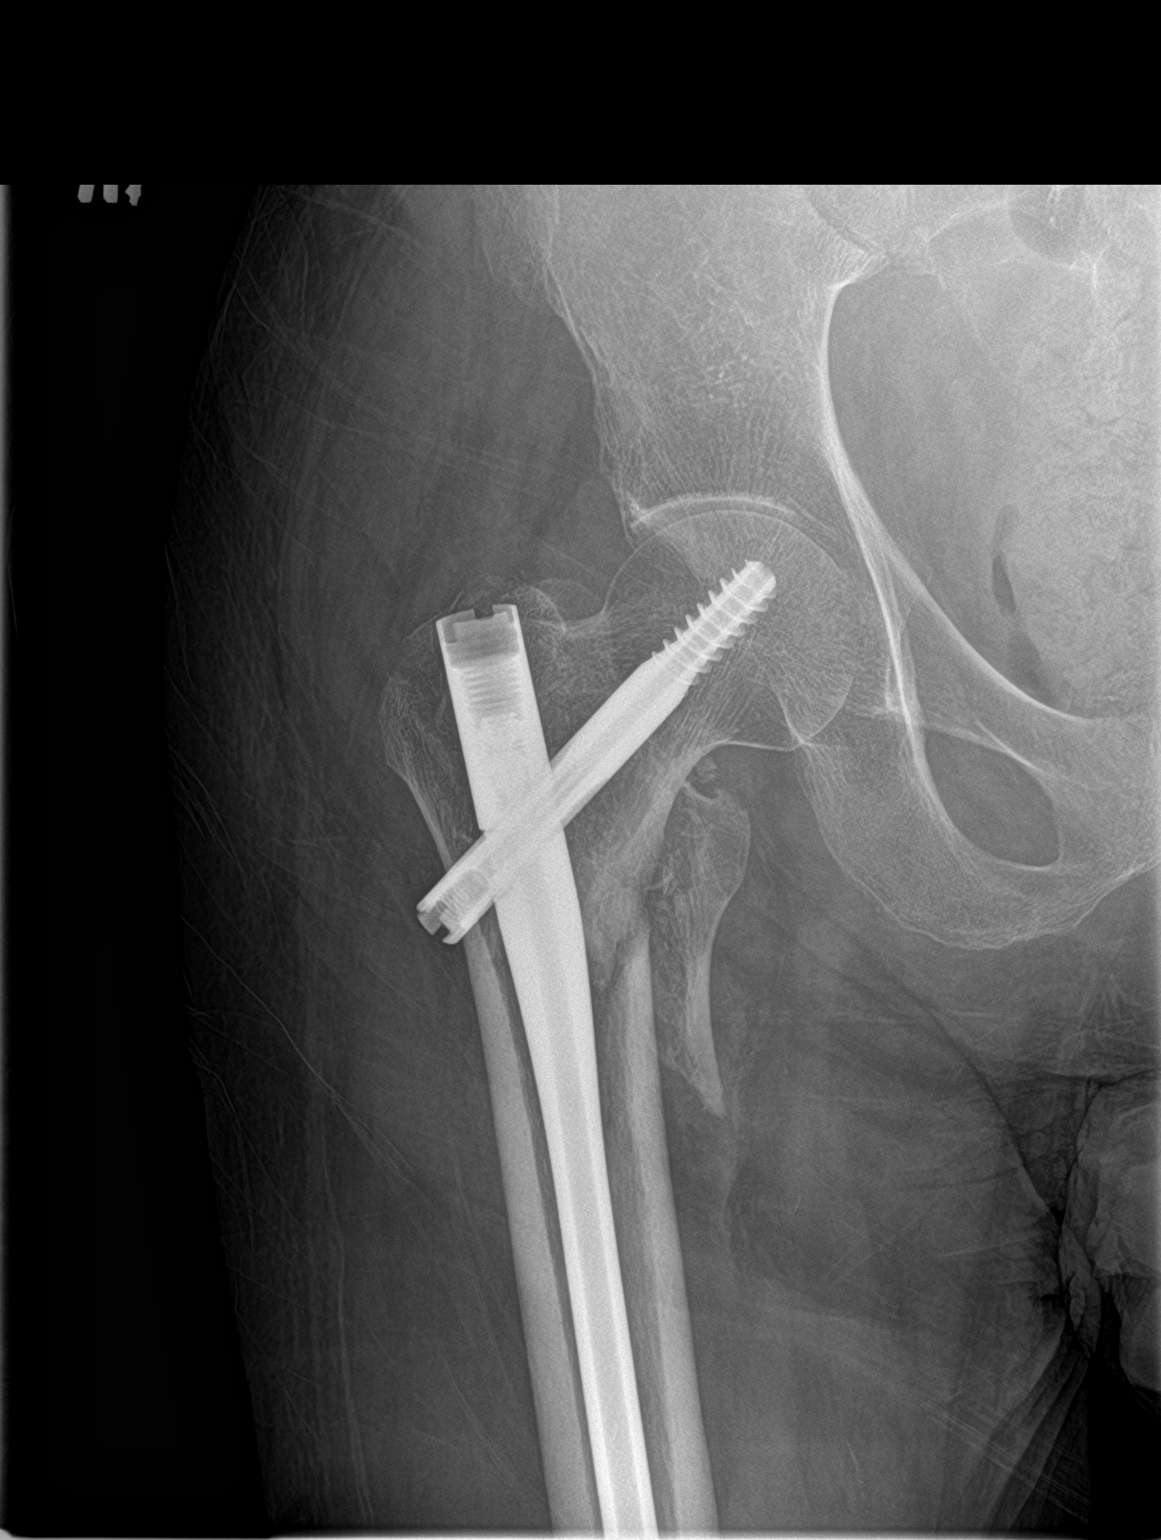

[hip x-table]
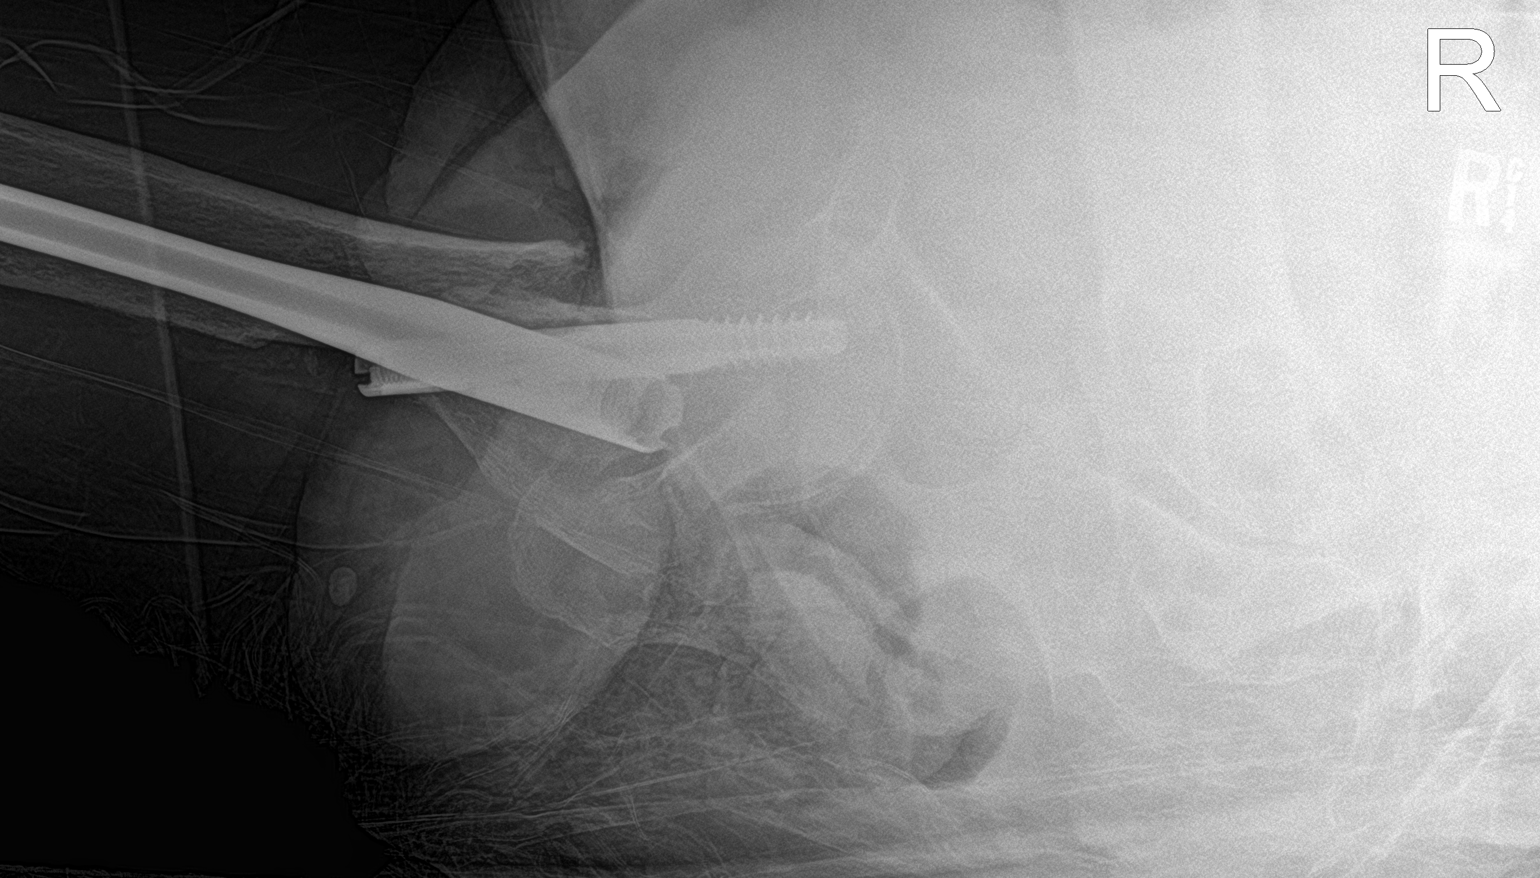

[pelvis ap]
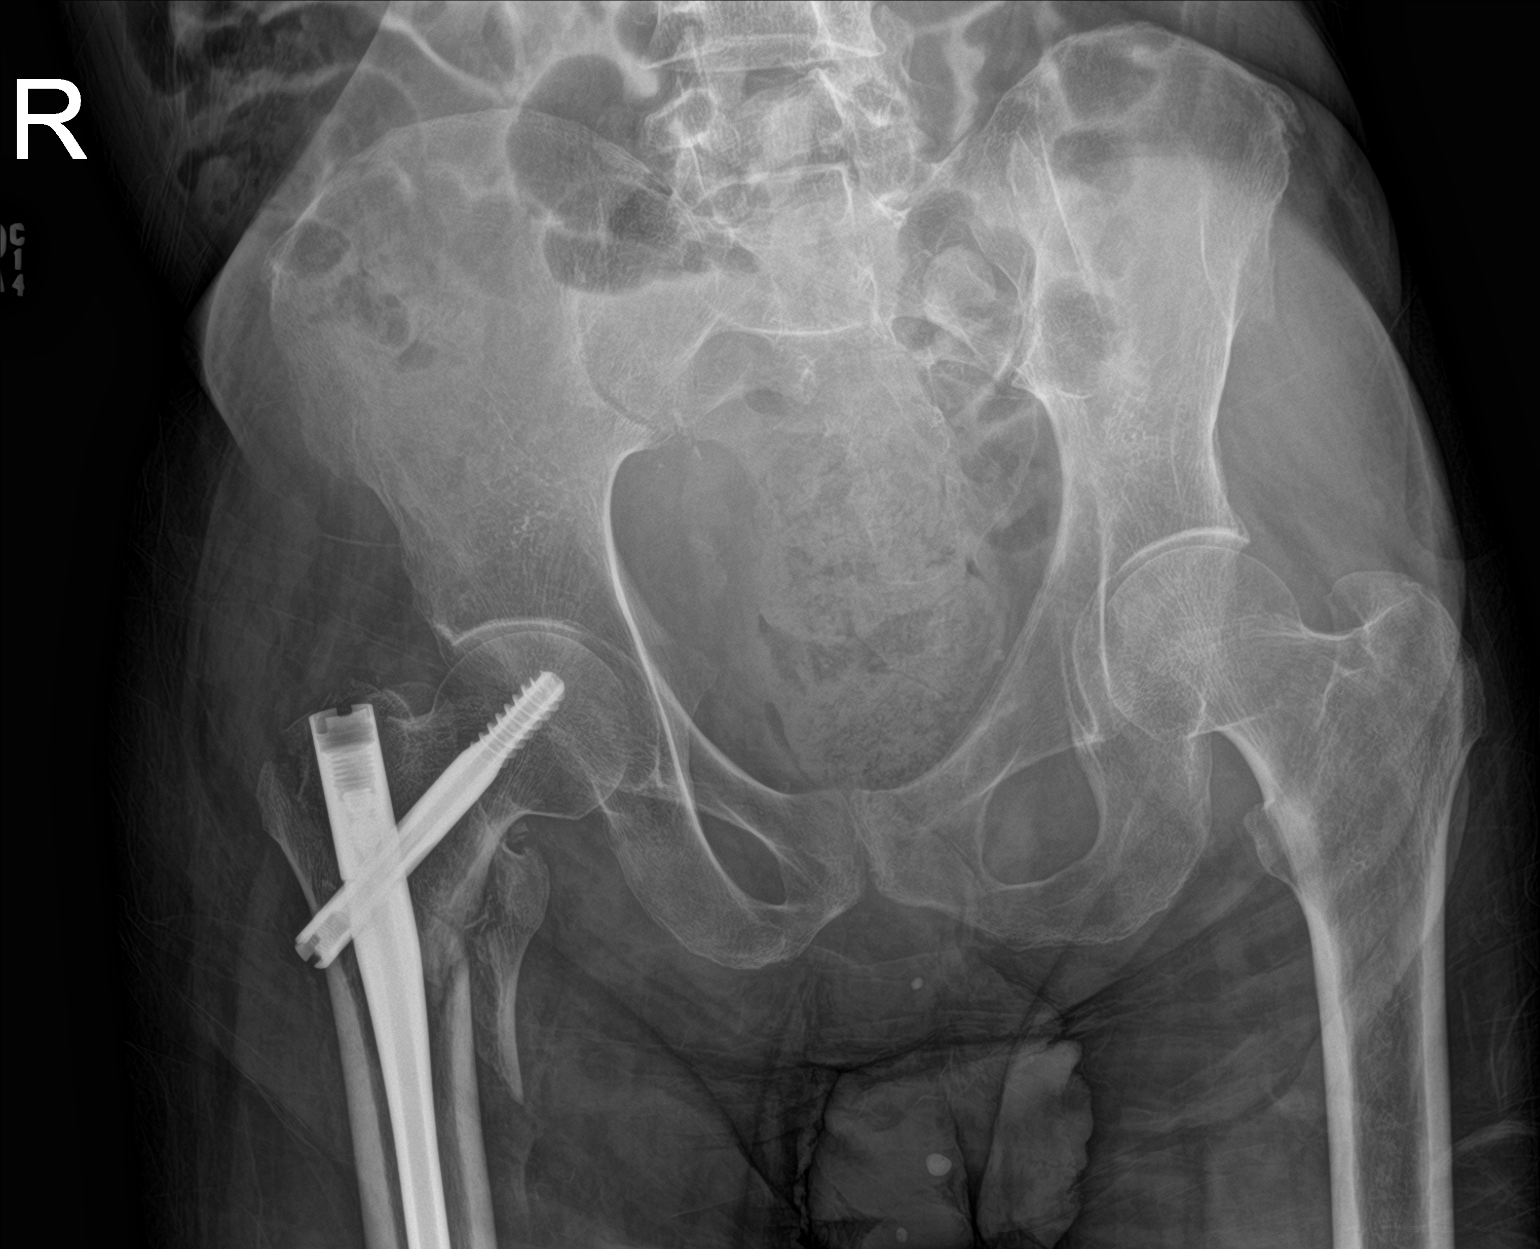

[3 of 3 positions shown; findings below may reference images not displayed]

FINDINGS: Intramedullary rodding of the right femur for comminuted
intertrochanteric and subtrochanteric fracture with displaced lesser
trochanteric fracture fragment. No dislocation. Pubic symphysis and
rami appear intact. No significant bridging callus.
IMPRESSION: Surgically fixated comminuted right intertrochanteric and
subtrochanteric fracture, without significant interval bony
bridging.

## 2021-06-13 ENCOUNTER — Other Ambulatory Visit: Payer: Self-pay | Admitting: Family Medicine

## 2021-06-14 ENCOUNTER — Other Ambulatory Visit: Payer: Self-pay | Admitting: Family Medicine

## 2021-09-14 ENCOUNTER — Other Ambulatory Visit: Payer: Self-pay | Admitting: Family Medicine

## 2021-09-29 ENCOUNTER — Other Ambulatory Visit: Payer: Self-pay | Admitting: Family Medicine

## 2021-10-24 ENCOUNTER — Other Ambulatory Visit: Payer: Self-pay | Admitting: Family Medicine

## 2021-11-10 ENCOUNTER — Other Ambulatory Visit: Payer: Self-pay | Admitting: Family Medicine

## 2021-12-04 ENCOUNTER — Other Ambulatory Visit: Payer: Self-pay | Admitting: Family Medicine

## 2022-01-02 ENCOUNTER — Other Ambulatory Visit: Payer: Self-pay | Admitting: Family Medicine

## 2022-01-06 ENCOUNTER — Other Ambulatory Visit: Payer: Self-pay | Admitting: Family Medicine

## 2022-02-01 ENCOUNTER — Other Ambulatory Visit: Payer: Self-pay | Admitting: Family Medicine

## 2022-02-16 ENCOUNTER — Other Ambulatory Visit: Payer: Self-pay | Admitting: Family Medicine

## 2022-02-26 ENCOUNTER — Telehealth: Payer: Self-pay | Admitting: Hospice

## 2022-02-26 DIAGNOSIS — Z515 Encounter for palliative care: Secondary | ICD-10-CM

## 2022-02-26 NOTE — Telephone Encounter (Signed)
NP called twice to check on patient, no success and no vm set up ?

## 2022-03-05 ENCOUNTER — Telehealth: Payer: Self-pay | Admitting: Hospice

## 2022-03-05 DIAGNOSIS — Z515 Encounter for palliative care: Secondary | ICD-10-CM

## 2022-03-05 NOTE — Telephone Encounter (Signed)
NP called patient and left him a voicemail with call back number ?

## 2022-04-03 ENCOUNTER — Other Ambulatory Visit: Payer: Self-pay | Admitting: Family Medicine

## 2022-04-24 ENCOUNTER — Other Ambulatory Visit: Payer: Self-pay | Admitting: Family Medicine

## 2022-05-08 DIAGNOSIS — M19071 Primary osteoarthritis, right ankle and foot: Secondary | ICD-10-CM | POA: Diagnosis not present

## 2022-05-08 DIAGNOSIS — L97521 Non-pressure chronic ulcer of other part of left foot limited to breakdown of skin: Secondary | ICD-10-CM | POA: Diagnosis not present

## 2022-05-08 DIAGNOSIS — L84 Corns and callosities: Secondary | ICD-10-CM | POA: Diagnosis not present

## 2022-05-08 DIAGNOSIS — M2011 Hallux valgus (acquired), right foot: Secondary | ICD-10-CM | POA: Diagnosis not present

## 2022-05-08 DIAGNOSIS — B351 Tinea unguium: Secondary | ICD-10-CM | POA: Diagnosis not present

## 2022-05-08 DIAGNOSIS — I739 Peripheral vascular disease, unspecified: Secondary | ICD-10-CM | POA: Diagnosis not present

## 2022-05-11 ENCOUNTER — Other Ambulatory Visit: Payer: Self-pay | Admitting: Family Medicine

## 2022-07-06 ENCOUNTER — Other Ambulatory Visit: Payer: Self-pay | Admitting: Family Medicine

## 2022-08-24 ENCOUNTER — Telehealth: Payer: Self-pay | Admitting: *Deleted

## 2022-08-24 NOTE — Patient Outreach (Signed)
  Care Coordination   08/24/2022 Name: Daniel [name] MRN: [id number] DOB: 10/27/1946   Care Coordination Outreach Attempts:  An unsuccessful telephone outreach was attempted today to offer the patient information about available care coordination services as a benefit of their health plan.   Follow Up Plan:  Additional outreach attempts will be made to offer the patient care coordination information and services.   Encounter Outcome:  No Answer  Care Coordination Interventions Activated:  No   Care Coordination Interventions:  No, not indicated    Raina Mina, RN Care Management Coordinator [place] Office [phone]

## 2022-08-31 ENCOUNTER — Ambulatory Visit: Payer: Self-pay

## 2022-08-31 NOTE — Patient Outreach (Signed)
  Care Coordination   08/31/2022 Name: KADIR AZUCENA MRN: [id number] DOB: 1946/02/14   Care Coordination Outreach Attempts:  A second unsuccessful outreach was attempted today to offer the patient with information about available care coordination services as a benefit of their health plan.     Follow Up Plan:  Additional outreach attempts will be made to offer the patient care coordination information and services.   Encounter Outcome:  No Answer  Care Coordination Interventions Activated:  No   Care Coordination Interventions:  No, not indicated    Daneen Schick, BSW, CDP Social Worker, Certified Dementia Practitioner [facility] Care Management  Care Coordination [phone]

## 2022-09-18 ENCOUNTER — Telehealth: Payer: Self-pay

## 2022-09-18 NOTE — Patient Outreach (Signed)
  Care Coordination   09/18/2022 Name: Daniel [name] MRN: [id number] DOB: 01-Dec-1946   Care Coordination Outreach Attempts:  A third unsuccessful outreach was attempted today to offer the patient with information about available care coordination services as a benefit of their health plan.   Follow Up Plan:  No further outreach attempts will be made at this time. We have been unable to contact the patient to offer or enroll patient in care coordination services  Encounter Outcome:  No Answer  Care Coordination Interventions Activated:  No   Care Coordination Interventions:  No, not indicated    Peter Garter RN, BSN,CCM, [facility] Management [phone]

## 2022-10-20 ENCOUNTER — Inpatient Hospital Stay (HOSPITAL_COMMUNITY): Payer: Medicare PPO

## 2022-10-20 ENCOUNTER — Encounter (HOSPITAL_COMMUNITY): Payer: Self-pay

## 2022-10-20 ENCOUNTER — Emergency Department (HOSPITAL_COMMUNITY): Payer: Medicare PPO

## 2022-10-20 ENCOUNTER — Other Ambulatory Visit: Payer: Self-pay

## 2022-10-20 ENCOUNTER — Inpatient Hospital Stay (HOSPITAL_COMMUNITY)
Admission: EM | Admit: 2022-10-20 | Discharge: 2022-10-27 | DRG: 064 | Disposition: A | Discharge status: Rehabilitation Facility | Payer: Medicare PPO | Attending: Internal Medicine | Admitting: Internal Medicine

## 2022-10-20 DIAGNOSIS — I69353 Hemiplegia and hemiparesis following cerebral infarction affecting right non-dominant side: Secondary | ICD-10-CM | POA: Diagnosis not present

## 2022-10-20 DIAGNOSIS — N179 Acute kidney failure, unspecified: Secondary | ICD-10-CM | POA: Diagnosis present

## 2022-10-20 DIAGNOSIS — G8111 Spastic hemiplegia affecting right dominant side: Secondary | ICD-10-CM | POA: Diagnosis not present

## 2022-10-20 DIAGNOSIS — I639 Cerebral infarction, unspecified: Secondary | ICD-10-CM | POA: Diagnosis not present

## 2022-10-20 DIAGNOSIS — R159 Full incontinence of feces: Secondary | ICD-10-CM | POA: Diagnosis not present

## 2022-10-20 DIAGNOSIS — I672 Cerebral atherosclerosis: Secondary | ICD-10-CM | POA: Diagnosis not present

## 2022-10-20 DIAGNOSIS — Z823 Family history of stroke: Secondary | ICD-10-CM

## 2022-10-20 DIAGNOSIS — I129 Hypertensive chronic kidney disease with stage 1 through stage 4 chronic kidney disease, or unspecified chronic kidney disease: Secondary | ICD-10-CM | POA: Diagnosis present

## 2022-10-20 DIAGNOSIS — E872 Acidosis, unspecified: Secondary | ICD-10-CM | POA: Insufficient documentation

## 2022-10-20 DIAGNOSIS — J189 Pneumonia, unspecified organism: Secondary | ICD-10-CM | POA: Diagnosis present

## 2022-10-20 DIAGNOSIS — I10 Essential (primary) hypertension: Secondary | ICD-10-CM | POA: Diagnosis not present

## 2022-10-20 DIAGNOSIS — I6521 Occlusion and stenosis of right carotid artery: Secondary | ICD-10-CM | POA: Diagnosis not present

## 2022-10-20 DIAGNOSIS — I6201 Nontraumatic acute subdural hemorrhage: Secondary | ICD-10-CM | POA: Diagnosis not present

## 2022-10-20 DIAGNOSIS — R531 Weakness: Secondary | ICD-10-CM | POA: Diagnosis not present

## 2022-10-20 DIAGNOSIS — S80212A Abrasion, left knee, initial encounter: Secondary | ICD-10-CM | POA: Diagnosis present

## 2022-10-20 DIAGNOSIS — N1832 Chronic kidney disease, stage 3b: Secondary | ICD-10-CM | POA: Diagnosis not present

## 2022-10-20 DIAGNOSIS — W050XXA Fall from non-moving wheelchair, initial encounter: Secondary | ICD-10-CM | POA: Diagnosis present

## 2022-10-20 DIAGNOSIS — S065X0A Traumatic subdural hemorrhage without loss of consciousness, initial encounter: Secondary | ICD-10-CM | POA: Diagnosis not present

## 2022-10-20 DIAGNOSIS — N189 Chronic kidney disease, unspecified: Secondary | ICD-10-CM | POA: Diagnosis present

## 2022-10-20 DIAGNOSIS — R471 Dysarthria and anarthria: Secondary | ICD-10-CM | POA: Diagnosis present

## 2022-10-20 DIAGNOSIS — E876 Hypokalemia: Secondary | ICD-10-CM | POA: Insufficient documentation

## 2022-10-20 DIAGNOSIS — N400 Enlarged prostate without lower urinary tract symptoms: Secondary | ICD-10-CM | POA: Diagnosis present

## 2022-10-20 DIAGNOSIS — F209 Schizophrenia, unspecified: Secondary | ICD-10-CM | POA: Diagnosis not present

## 2022-10-20 DIAGNOSIS — R29818 Other symptoms and signs involving the nervous system: Secondary | ICD-10-CM | POA: Diagnosis not present

## 2022-10-20 DIAGNOSIS — I63512 Cerebral infarction due to unspecified occlusion or stenosis of left middle cerebral artery: Secondary | ICD-10-CM | POA: Diagnosis not present

## 2022-10-20 DIAGNOSIS — I6529 Occlusion and stenosis of unspecified carotid artery: Secondary | ICD-10-CM | POA: Diagnosis not present

## 2022-10-20 DIAGNOSIS — I6523 Occlusion and stenosis of bilateral carotid arteries: Secondary | ICD-10-CM | POA: Diagnosis not present

## 2022-10-20 DIAGNOSIS — R918 Other nonspecific abnormal finding of lung field: Secondary | ICD-10-CM | POA: Diagnosis not present

## 2022-10-20 DIAGNOSIS — D631 Anemia in chronic kidney disease: Secondary | ICD-10-CM | POA: Diagnosis not present

## 2022-10-20 DIAGNOSIS — Z993 Dependence on wheelchair: Secondary | ICD-10-CM | POA: Diagnosis not present

## 2022-10-20 DIAGNOSIS — I6522 Occlusion and stenosis of left carotid artery: Secondary | ICD-10-CM | POA: Diagnosis not present

## 2022-10-20 DIAGNOSIS — N184 Chronic kidney disease, stage 4 (severe): Secondary | ICD-10-CM | POA: Diagnosis not present

## 2022-10-20 DIAGNOSIS — N390 Urinary tract infection, site not specified: Secondary | ICD-10-CM | POA: Diagnosis present

## 2022-10-20 DIAGNOSIS — I69351 Hemiplegia and hemiparesis following cerebral infarction affecting right dominant side: Secondary | ICD-10-CM

## 2022-10-20 DIAGNOSIS — R509 Fever, unspecified: Secondary | ICD-10-CM | POA: Diagnosis not present

## 2022-10-20 DIAGNOSIS — Z87891 Personal history of nicotine dependence: Secondary | ICD-10-CM | POA: Diagnosis not present

## 2022-10-20 DIAGNOSIS — E860 Dehydration: Secondary | ICD-10-CM | POA: Diagnosis present

## 2022-10-20 DIAGNOSIS — E861 Hypovolemia: Secondary | ICD-10-CM | POA: Diagnosis present

## 2022-10-20 DIAGNOSIS — D649 Anemia, unspecified: Secondary | ICD-10-CM | POA: Diagnosis not present

## 2022-10-20 DIAGNOSIS — R4701 Aphasia: Secondary | ICD-10-CM | POA: Diagnosis present

## 2022-10-20 DIAGNOSIS — Z8042 Family history of malignant neoplasm of prostate: Secondary | ICD-10-CM

## 2022-10-20 DIAGNOSIS — I959 Hypotension, unspecified: Secondary | ICD-10-CM | POA: Diagnosis not present

## 2022-10-20 DIAGNOSIS — I6389 Other cerebral infarction: Secondary | ICD-10-CM | POA: Diagnosis not present

## 2022-10-20 DIAGNOSIS — R29810 Facial weakness: Secondary | ICD-10-CM | POA: Diagnosis present

## 2022-10-20 DIAGNOSIS — B9620 Unspecified Escherichia coli [E. coli] as the cause of diseases classified elsewhere: Secondary | ICD-10-CM | POA: Diagnosis present

## 2022-10-20 DIAGNOSIS — E785 Hyperlipidemia, unspecified: Secondary | ICD-10-CM | POA: Diagnosis present

## 2022-10-20 DIAGNOSIS — R29714 NIHSS score 14: Secondary | ICD-10-CM | POA: Diagnosis present

## 2022-10-20 DIAGNOSIS — R000 Tachycardia, unspecified: Secondary | ICD-10-CM | POA: Diagnosis not present

## 2022-10-20 DIAGNOSIS — S065XAA Traumatic subdural hemorrhage with loss of consciousness status unknown, initial encounter: Secondary | ICD-10-CM | POA: Diagnosis not present

## 2022-10-20 DIAGNOSIS — R4182 Altered mental status, unspecified: Secondary | ICD-10-CM | POA: Diagnosis present

## 2022-10-20 DIAGNOSIS — R296 Repeated falls: Secondary | ICD-10-CM | POA: Diagnosis present

## 2022-10-20 DIAGNOSIS — Z8249 Family history of ischemic heart disease and other diseases of the circulatory system: Secondary | ICD-10-CM

## 2022-10-20 DIAGNOSIS — Z79899 Other long term (current) drug therapy: Secondary | ICD-10-CM

## 2022-10-20 DIAGNOSIS — E559 Vitamin D deficiency, unspecified: Secondary | ICD-10-CM | POA: Diagnosis present

## 2022-10-20 DIAGNOSIS — Z7902 Long term (current) use of antithrombotics/antiplatelets: Secondary | ICD-10-CM

## 2022-10-20 LAB — CBC WITH DIFFERENTIAL/PLATELET
Abs Immature Granulocytes: 0.02 10*3/uL (ref 0.00–0.07)
Basophils Absolute: 0 10*3/uL (ref 0.0–0.1)
Basophils Relative: 0 %
Eosinophils Absolute: 0 10*3/uL (ref 0.0–0.5)
Eosinophils Relative: 1 %
HCT: 36.8 % — ABNORMAL LOW (ref 39.0–52.0)
Hemoglobin: 11.8 g/dL — ABNORMAL LOW (ref 13.0–17.0)
Immature Granulocytes: 0 %
Lymphocytes Relative: 13 %
Lymphs Abs: 0.8 10*3/uL (ref 0.7–4.0)
MCH: 28.9 pg (ref 26.0–34.0)
MCHC: 32.1 g/dL (ref 30.0–36.0)
MCV: 90 fL (ref 80.0–100.0)
Monocytes Absolute: 0.5 10*3/uL (ref 0.1–1.0)
Monocytes Relative: 8 %
Neutro Abs: 4.8 10*3/uL (ref 1.7–7.7)
Neutrophils Relative %: 78 %
Platelets: 249 10*3/uL (ref 150–400)
RBC: 4.09 MIL/uL — ABNORMAL LOW (ref 4.22–5.81)
RDW: 13.3 % (ref 11.5–15.5)
Smear Review: NORMAL
WBC: 6.1 10*3/uL (ref 4.0–10.5)
nRBC: 0 % (ref 0.0–0.2)

## 2022-10-20 LAB — COMPREHENSIVE METABOLIC PANEL
ALT: 19 U/L (ref 0–44)
AST: 26 U/L (ref 15–41)
Albumin: 3.7 g/dL (ref 3.5–5.0)
Alkaline Phosphatase: 81 U/L (ref 38–126)
Anion gap: 13 (ref 5–15)
BUN: 41 mg/dL — ABNORMAL HIGH (ref 8–23)
CO2: 19 mmol/L — ABNORMAL LOW (ref 22–32)
Calcium: 7.1 mg/dL — ABNORMAL LOW (ref 8.9–10.3)
Chloride: 105 mmol/L (ref 98–111)
Creatinine, Ser: 5.85 mg/dL — ABNORMAL HIGH (ref 0.61–1.24)
GFR, Estimated: 9 mL/min — ABNORMAL LOW (ref >60)
Glucose, Bld: 108 mg/dL — ABNORMAL HIGH (ref 70–99)
Potassium: 2.9 mmol/L — ABNORMAL LOW (ref 3.5–5.1)
Sodium: 137 mmol/L (ref 135–145)
Total Bilirubin: 0.5 mg/dL (ref 0.3–1.2)
Total Protein: 7.2 g/dL (ref 6.5–8.1)

## 2022-10-20 LAB — PROTIME-INR
INR: 1.1 (ref 0.8–1.2)
Prothrombin Time: 13.7 seconds (ref 11.4–15.2)

## 2022-10-20 LAB — I-STAT CHEM 8, ED
BUN: 39 mg/dL — ABNORMAL HIGH (ref 8–23)
Calcium, Ion: 0.93 mmol/L — ABNORMAL LOW (ref 1.15–1.40)
Chloride: 106 mmol/L (ref 98–111)
Creatinine, Ser: 6.4 mg/dL — ABNORMAL HIGH (ref 0.61–1.24)
Glucose, Bld: 107 mg/dL — ABNORMAL HIGH (ref 70–99)
HCT: 37 % — ABNORMAL LOW (ref 39.0–52.0)
Hemoglobin: 12.6 g/dL — ABNORMAL LOW (ref 13.0–17.0)
Potassium: 2.9 mmol/L — ABNORMAL LOW (ref 3.5–5.1)
Sodium: 140 mmol/L (ref 135–145)
TCO2: 20 mmol/L — ABNORMAL LOW (ref 22–32)

## 2022-10-20 LAB — ETHANOL: Alcohol, Ethyl (B): <10 mg/dL (ref <10)

## 2022-10-20 MED ORDER — ACETAMINOPHEN 325 MG PO TABS
650.0000 mg | ORAL_TABLET | Freq: Four times a day (QID) | ORAL | Status: DC | PRN
  Administered 2022-10-27 (×2): 650 mg via ORAL
  Filled 2022-10-20 (×2): qty 2

## 2022-10-20 MED ORDER — STROKE: EARLY STAGES OF RECOVERY BOOK
Freq: Once | Status: DC
  Filled 2022-10-20 (×2): qty 1

## 2022-10-20 MED ORDER — SODIUM CHLORIDE 0.9 % IV SOLN: INTRAVENOUS | Status: DC

## 2022-10-20 MED ORDER — POTASSIUM CHLORIDE 10 MEQ/100ML IV SOLN
10.0000 meq | INTRAVENOUS | Status: AC
  Administered 2022-10-21 (×4): 10 meq via INTRAVENOUS
  Filled 2022-10-20 (×4): qty 100

## 2022-10-20 MED ORDER — POTASSIUM CHLORIDE 10 MEQ/100ML IV SOLN
10.0000 meq | INTRAVENOUS | Status: AC
  Administered 2022-10-20 (×2): 10 meq via INTRAVENOUS
  Filled 2022-10-20 (×2): qty 100

## 2022-10-20 MED ORDER — ACETAMINOPHEN 650 MG RE SUPP: 650.0000 mg | Freq: Four times a day (QID) | RECTAL | Status: DC | PRN

## 2022-10-20 MED ORDER — CALCIUM GLUCONATE-NACL 2-0.675 GM/100ML-% IV SOLN
2.0000 g | Freq: Once | INTRAVENOUS | Status: AC
  Administered 2022-10-20: 2000 mg via INTRAVENOUS
  Filled 2022-10-20: qty 100

## 2022-10-20 NOTE — ED Notes (Signed)
Patient transported to MRI 

## 2022-10-20 NOTE — H&P (Signed)
History and Physical    Daniel [name] CXK:[id number] DOB: 1946-05-07 DOA: 10/20/2022  PCP: Vivi Barrack, MD  Patient coming from: Home  Chief Complaint: Strokelike symptoms  HPI: Daniel [name] is a 76 y.o. male with medical history significant of hypertension, hyperlipidemia, stroke with residual right hemiparesis, CKD stage IIIb-IV, schizophrenia, BPH presented to the ED via EMS with right-sided facial droop and slurred speech. LKW 3 days ago. Labs showing hemoglobin 11.8 (stable), potassium 2.9, bicarb 19, anion gap 13, glucose 108, BUN 41, creatinine 5.8 (baseline 2.0-2.3 in 2021), calcium 7.1, albumin 3.7, ionized calcium 0.93, UA pending, UDS pending, blood ethanol level undetectable, INR 1.1.  Brain MRI showing a large area of acute ischemia within the anterior left MCA territory.  No hemorrhage or mass effect.  Also showing bilateral subdural hematomas, right greater than left. Patient was given potassium supplement.  ED physician discussed the case with on-call neurosurgeon Dr. Marcello Moores who felt that there was no indication for emergent intervention.  Neurology consulted.  [facility] called to admit.  History very limited as it is difficult to understand the patient due to his dysarthria.  He reports history of prior stroke 3 years ago with residual right-sided weakness.  Reports 2 recent falls over the past week or two.   Review of Systems:  Review of Systems  All other systems reviewed and are negative.   Past Medical History:  Diagnosis Date   Hypertension    Psychosis ([place])    Schizophrenia ([place]) 1974   Stroke [facility]) 2010   Right Hemiparesis    Past Surgical History:  Procedure Laterality Date   FEMUR IM NAIL Right 12/12/2019   Procedure: INTRAMEDULLARY (IM) NAIL FEMORAL;  Surgeon: Renette Butters, MD;  Location: [place];  Service: Orthopedics;  Laterality: Right;     reports that he has quit smoking. His smoking use included cigarettes. He smoked an average of .1 packs per  day. He has never used smokeless tobacco. He reports that he does not currently use alcohol. He reports that he does not currently use drugs.  No Known Allergies  Family History  Problem Relation Age of Onset   Hypertension Mother    CVA Sister    Breast cancer Sister    Prostate cancer Brother     Prior to Admission medications   Medication Sig Start Date End Date Taking? Authorizing Provider  amLODipine (NORVASC) 10 MG tablet TAKE 1 TABLET BY MOUTH EVERY DAY 11/11/21   Vivi Barrack, MD  atorvastatin (LIPITOR) 40 MG tablet TAKE 1 TABLET BY MOUTH EVERY DAY 06/16/21   Vivi Barrack, MD  camphor-menthol [facility]) lotion Apply 1 application topically as needed for itching. 04/30/20   Vivi Barrack, MD  clopidogrel (PLAVIX) 75 MG tablet TAKE 1 TABLET BY MOUTH EVERY DAY 02/02/22   Vivi Barrack, MD  folic acid (FOLVITE) 1 MG tablet TAKE 1 TABLET BY MOUTH EVERY DAY 02/02/22   Vivi Barrack, MD  gabapentin (NEURONTIN) 100 MG capsule TAKE 1 CAPSULE BY MOUTH THREE TIMES A DAY 03/29/20   Vivi Barrack, MD  lisinopril (ZESTRIL) 20 MG tablet TAKE 1 TABLET BY MOUTH EVERY DAY 02/02/22   Vivi Barrack, MD  metoprolol tartrate (LOPRESSOR) 100 MG tablet TAKE 1 TABLET BY MOUTH TWICE A DAY 02/02/22   Vivi Barrack, MD  Multiple Vitamins-Minerals (MULTIVITAMIN WITH MINERALS) tablet Take 1 tablet by mouth daily. 08/17/11   Trish Fountain, MD  polyethylene glycol (MIRALAX / GLYCOLAX) 17  g packet Take 17 g by mouth daily as needed for mild constipation. 12/20/19   Aline August, MD  senna-docusate (SENOKOT-S) 8.6-50 MG tablet Take 1 tablet by mouth 2 (two) times daily. Patient taking differently: Take 1 tablet by mouth 2 (two) times daily as needed for mild constipation or moderate constipation.  12/20/19   Aline August, MD  tamsulosin (FLOMAX) 0.4 MG CAPS capsule TAKE 1 CAPSULE BY MOUTH EVERY DAY 02/02/22   Vivi Barrack, MD    Physical Exam: Vitals:   10/20/22 1750 10/20/22 1900 10/20/22 2025  10/20/22 2030  BP: (!) 143/74 (!) 147/80 (!) 155/87 (!) 149/86  Pulse: (!) 57 (!) 57 62 (!) 56  Resp: 16 13 13 13   Temp:      TempSrc:      SpO2: 100% 100% 98% 97%  Weight:      Height:        Physical Exam Vitals reviewed.  Constitutional:      General: He is not in acute distress. HENT:     Head: Normocephalic and atraumatic.  Eyes:     Extraocular Movements: Extraocular movements intact.  Cardiovascular:     Rate and Rhythm: Normal rate and regular rhythm.     Pulses: Normal pulses.  Pulmonary:     Effort: Pulmonary effort is normal. No respiratory distress.     Breath sounds: Normal breath sounds. No wheezing or rales.  Abdominal:     General: Bowel sounds are normal. There is no distension.     Palpations: Abdomen is soft.     Tenderness: There is no abdominal tenderness.  Musculoskeletal:     Cervical back: Normal range of motion.     Right lower leg: No edema.     Left lower leg: No edema.  Skin:    General: Skin is warm and dry.  Neurological:     Mental Status: He is alert.     Comments: Right-sided facial droop Dysarthria Strength 5 out of 5 in the left upper extremity, unable to move his right upper extremity (chronic per patient) Strength 5 out of 5 in the left lower extremity and 4 out of 5 in the right lower extremity      Labs on Admission: I have personally reviewed following labs and imaging studies  CBC: Recent Labs  Lab 10/20/22 1453 10/20/22 1501  WBC 6.1  --   NEUTROABS 4.8  --   HGB 11.8* 12.6*  HCT 36.8* 37.0*  MCV 90.0  --   PLT 249  --    Basic Metabolic Panel: Recent Labs  Lab 10/20/22 1453 10/20/22 1501  NA 137 140  K 2.9* 2.9*  CL 105 106  CO2 19*  --   GLUCOSE 108* 107*  BUN 41* 39*  CREATININE 5.85* 6.40*  CALCIUM 7.1*  --    GFR: Estimated Creatinine Clearance: 10.7 mL/min (A) (by C-G formula based on SCr of 6.4 mg/dL (H)). Liver Function Tests: Recent Labs  Lab 10/20/22 1453  AST 26  ALT 19  ALKPHOS 81   BILITOT 0.5  PROT 7.2  ALBUMIN 3.7   No results for input(s): "LIPASE", "AMYLASE" in the last 168 hours. No results for input(s): "AMMONIA" in the last 168 hours. Coagulation Profile: Recent Labs  Lab 10/20/22 1453  INR 1.1   Cardiac Enzymes: No results for input(s): "CKTOTAL", "CKMB", "CKMBINDEX", "TROPONINI" in the last 168 hours. BNP (last 3 results) No results for input(s): "PROBNP" in the last 8760 hours. HbA1C: No results  for input(s): "HGBA1C" in the last 72 hours. CBG: No results for input(s): "GLUCAP" in the last 168 hours. Lipid Profile: No results for input(s): "CHOL", "HDL", "LDLCALC", "TRIG", "CHOLHDL", "LDLDIRECT" in the last 72 hours. Thyroid Function Tests: No results for input(s): "TSH", "T4TOTAL", "FREET4", "T3FREE", "THYROIDAB" in the last 72 hours. Anemia Panel: No results for input(s): "VITAMINB12", "FOLATE", "FERRITIN", "TIBC", "IRON", "RETICCTPCT" in the last 72 hours. Urine analysis:    Component Value Date/Time   COLORURINE YELLOW 08/28/2020 [place] 08/28/2020 1413   LABSPEC 1.009 08/28/2020 1413   PHURINE 7.5 08/28/2020 1413   GLUCOSEU NEGATIVE 08/28/2020 1413   HGBUR NEGATIVE 08/28/2020 1413   BILIRUBINUR NEGATIVE 04/19/2020 1800   BILIRUBINUR Negative 03/29/2018 1009   KETONESUR NEGATIVE 08/28/2020 1413   PROTEINUR 1+ (A) 08/28/2020 1413   UROBILINOGEN 0.2 03/29/2018 1009   UROBILINOGEN 0.2 08/05/2009 2334   NITRITE NEGATIVE 08/28/2020 1413   LEUKOCYTESUR NEGATIVE 08/28/2020 1413    Radiological Exams on Admission: MR BRAIN WO CONTRAST  Result Date: 10/20/2022 CLINICAL DATA:  Acute neurologic deficit EXAM: MRI HEAD WITHOUT CONTRAST TECHNIQUE: Multiplanar, multiecho pulse sequences of the brain and surrounding structures were obtained without intravenous contrast. COMPARISON:  07/31/2009 FINDINGS: Brain: Large area of acute ischemia within the anterior left MCA territory. There are bilateral subdural hematomas right  greater than left. There is confluent hyperintense T2-weighted signal within the white matter. Generalized volume loss. Encephalomalacia at the site of old left anterior cerebral artery infarct. The midline structures are normal. Vascular: Major flow voids are preserved. Skull and upper cervical spine: Normal calvarium and skull base. Visualized upper cervical spine and soft tissues are normal. Sinuses/Orbits:No paranasal sinus fluid levels or advanced mucosal thickening. No mastoid or middle ear effusion. Normal orbits. IMPRESSION: 1. Large area of acute ischemia within the anterior left MCA territory. No hemorrhage or mass effect. 2. Bilateral subdural hematomas right greater than left. Electronically Signed   By: Ulyses Jarred M.D.   On: 10/20/2022 20:18   CT Head Wo Contrast  Result Date: 10/20/2022 CLINICAL DATA:  Neuro deficit, acute, stroke suspected EXAM: CT HEAD WITHOUT CONTRAST TECHNIQUE: Contiguous axial images were obtained from the base of the skull through the vertex without intravenous contrast. RADIATION DOSE REDUCTION: This exam was performed according to the departmental dose-optimization program which includes automated exposure control, adjustment of the mA and/or kV according to patient size and/or use of iterative reconstruction technique. COMPARISON:  None Available. BRAIN: BRAIN Cerebral ventricle sizes are concordant with the degree of cerebral volume loss. Patchy and confluent areas of decreased attenuation are noted throughout the deep and periventricular white matter of the cerebral hemispheres bilaterally, compatible with chronic microvascular ischemic disease. Chronic left frontoparietal and temporal encephalomalacia. No evidence of large-territorial acute infarction. No parenchymal hemorrhage. No mass lesion. Acute density 72mm right subdural hematoma extending along the falx cerebral and tentorium. Query separate 30mm left parafalcine hematoma. No mass effect or midline shift. No  hydrocephalus. Basilar cisterns are patent. Vascular: No hyperdense vessel. Atherosclerotic calcifications are present within the cavernous internal carotid and vertebral arteries. Skull: No acute fracture or focal lesion. Sinuses/Orbits: Paranasal sinuses and mastoid air cells are clear. The orbits are unremarkable. Other: None. IMPRESSION: 1. Acute 6 mm right subdural hematoma extending along the falx cerebri and tentorium. No definite associated mass effect. 2. Query separate 66mm left parafalcine hematoma. These results were called by telephone at the time of interpretation on 10/20/2022 at 5:04 pm to provider [facility] , who verbally  acknowledged these results. Electronically Signed   By: Iven Finn M.D.   On: 10/20/2022 17:05    EKG: Independently reviewed.  Sinus rhythm, QTc 483, T wave inversions in inferior and lateral leads.  No significant change since prior tracing from 12/11/2019.  Assessment and Plan  Acute ischemic stroke Bilateral subdural hematomas Patient with history of prior stroke (on Plavix) with residual right hemiparesis presenting with right-sided facial droop and dysarthria, LKW 3 days ago.  He reports 2 recent falls.  Brain MRI showing a large area of acute ischemia within the anterior left MCA territory.  No hemorrhage or mass effect.  Also showing bilateral subdural hematomas, right greater than left. -ED physician discussed the case with on-call neurosurgeon Dr. Marcello Moores who felt that there was no indication for emergent intervention. -Neurology consulted and ordered MRA head without contrast, carotid Dopplers, echocardiogram, A1c, and lipid panel. -Hold Plavix at this time in the setting of subdural hematomas.  Further recommendations per neurology. -Telemetry monitoring -Frequent neurochecks -PT, OT, speech therapy. -N.p.o. until cleared by bedside swallow evaluation or formal speech evaluation   AKI on CKD stage IIIb-IV Mild metabolic acidosis BUN 41, creatinine  5.8, bicarb 19.  Baseline creatinine 2.0-2.3 in 2021. -IV fluid hydration -Renal ultrasound -Repeat BMP in a.m. -Avoid nephrotoxic agents/hold home lisinopril  Hypokalemia EKG without acute changes. -Potassium replacement and continue to monitor labs -Check magnesium level and replace if low  Hypocalcemia -Replace calcium and continue to monitor labs -Check PTH and vitamin D levels  Hypertension Hyperlipidemia Schizophrenia BPH -Pharmacy med rec pending.  DVT prophylaxis: SCDs Code Status: Full Code (discussed with the patient) Family Communication: No family available at this time. Level of care: Progressive Care Unit Admission status: It is my clinical opinion that admission to INPATIENT is reasonable and necessary because of the expectation that this patient will require hospital care that crosses at least 2 midnights to treat this condition based on the medical complexity of the problems presented.  Given the aforementioned information, the predictability of an adverse outcome is felt to be significant.   Shela Leff MD [facility]  If 7PM-7AM, please contact night-coverage [email or web address]  10/20/2022, 10:05 PM

## 2022-10-20 NOTE — Consult Note (Signed)
Neurology Consultation Reason for Consult: Stroke on MRI Requesting Physician: Shela Leff  CC: Worsening right sided weakness  History is obtained from: Patient and chart review  HPI: Daniel [name] is a 76 y.o. male with a past medical history significant for prior left MCA stroke (2012, secondary to left carotid stenosis, with residual right hemiparesis and some cognitive impairment/slurred speech), hypertension, hyperlipidemia, peripheral vascular disease, CKD, poor dentition, remote alcohol use, questionable diagnosis of schizophrenia, prior smoking  He notes that 2 days ago he began to have worsening weakness on the right side.  In this setting he had a couple of falls out of his wheelchair when he was trying to help with transfers.  Due to persistent symptoms he came to the ED for evaluation where MRI brain revealed bilateral subdural hemorrhages and new left MCA territory stroke, with now total occlusion of his left carotid artery.  On chart review in May 2012 he was planned for CEA 4 to 6 weeks after his stroke to reduce the risk of hemorrhagic conversion.  However he reports that this surgery was never completed to the best of his ability to recall.  LKW: November 12 Thrombolytic given?: No, out of the window IA performed?: No, out of the window Premorbid modified rankin scale:      4 - Moderately severe disability. Unable to attend to own bodily needs without assistance, and unable to walk unassisted.   ROS: All other review of systems was negative except as noted in the HPI.   Past Medical History:  Diagnosis Date   Hypertension    Psychosis ([place])    Schizophrenia ([place]) 1974   Stroke [facility]) 2010   Right Hemiparesis   Past Surgical History:  Procedure Laterality Date   FEMUR IM NAIL Right 12/12/2019   Procedure: INTRAMEDULLARY (IM) NAIL FEMORAL;  Surgeon: Renette Butters, MD;  Location: [place];  Service: Orthopedics;  Laterality: Right;   Family History  Problem  Relation Age of Onset   Hypertension Mother    CVA Sister    Breast cancer Sister    Prostate cancer Brother     Social History:  reports that he has quit smoking. His smoking use included cigarettes. He smoked an average of .1 packs per day. He has never used smokeless tobacco. He reports that he does not currently use alcohol. He reports that he does not currently use drugs.   Exam: Current vital signs: BP (!) 149/86   Pulse (!) 56   Temp 98.5 F (36.9 C) (Oral)   Resp 13   Ht 5\' 9"  (1.753 m)   Wt 86.2 kg   SpO2 97%   BMI 28.06 kg/m  Vital signs in last 24 hours: Temp:  [98.5 F (36.9 C)] 98.5 F (36.9 C) (11/14 1618) Pulse Rate:  [56-64] 56 (11/14 2030) Resp:  [13-20] 13 (11/14 2030) BP: (113-155)/(72-101) 149/86 (11/14 2030) SpO2:  [97 %-100 %] 97 % (11/14 2030) Weight:  [86.2 kg] 86.2 kg (11/14 1347)   Physical Exam  Constitutional: Appears chronically ill, frail, with significant muscle wasting Psych: Affect appropriate to situation, calm and cooperative Eyes: No scleral injection HENT: No oropharyngeal obstruction.  MSK: Contractures of the bilateral legs and right upper extremity Cardiovascular: Normal rate and regular rhythm. Perfusing extremities well Respiratory: Effort normal, non-labored breathing GI: Soft.  No distension. There is no tenderness.  Skin: Abrasion of the left knee  Neuro: Mental Status: Patient is awake, alert, oriented to person, place, month, and situation, but  was unable to state his age. Patient is able to give a fair amount of history, slightly limited by word finding deficits with some mild loss of fluency of speech.  No neglect Cranial Nerves: II: Visual Fields testing reveals patient is able to count fingers in all visual fields. Pupils are equal, round, and reactive to light.   III,IV, VI: Slight left gaze preference but able to look all the way to the right V: Facial sensation is symmetric to light touch VII: Facial movement is  notable for pronounced right facial droop VIII: hearing is intact to voice X: Uvula elevates symmetrically XI: Shoulder shrug is symmetric.  Motor: Tone is normal. Bulk is normal.  Only trace movement of the right upper extremity.  Left upper extremity has some slight pronation but no drift.  Right lower extremity contracted with trace movement.  Left lower extremity also has chronic flexion contracture but he is able to lift the leg antigravity and maintain it for a full 5 seconds Sensory: Sensation is symmetric to light touch and temperature in the arms and legs. Deep Tendon Reflexes: 2+ and symmetric in the brachioradialis and patellae.  Plantars: Toes are downgoing bilaterally.  Cerebellar: FNF and HKS are intact bilaterally Gait:  Deferred in acute setting   NIHSS total 14 Score breakdown: One-point for not answering age correctly, one-point for slight left gaze preference, 2 points for right facial droop, 3 points for right arm weakness, one-point for left leg weakness, 3 points for right leg weakness, one-point for sensory loss in the right lower extremity, one-point for mild to moderate aphasia, one-point for moderate dysarthria Performed at 3 AM  I have reviewed labs in epic and the results pertinent to this consultation are:  Basic Metabolic Panel: Recent Labs  Lab 10/20/22 1453 10/20/22 1501  NA 137 140  K 2.9* 2.9*  CL 105 106  CO2 19*  --   GLUCOSE 108* 107*  BUN 41* 39*  CREATININE 5.85* 6.40*  CALCIUM 7.1*  --     CBC: Recent Labs  Lab 10/20/22 1453 10/20/22 1501  WBC 6.1  --   NEUTROABS 4.8  --   HGB 11.8* 12.6*  HCT 36.8* 37.0*  MCV 90.0  --   PLT 249  --     Coagulation Studies: Recent Labs    10/20/22 1453  LABPROT 13.7  INR 1.1    Lab Results  Component Value Date   HGBA1C 5.0 03/29/2020   Lab Results  Component Value Date   CHOL 124 03/29/2020   HDL 53.10 03/29/2020   LDLCALC 62 03/29/2020   TRIG 47.0 03/29/2020   CHOLHDL 2  03/29/2020     I have reviewed the images obtained:  MRI brain personally reviewed, agree with radiology 1. Large area of acute ischemia within the anterior left MCA territory. No hemorrhage or mass effect. 2. Bilateral subdural hematomas right greater than left.  MRA 1. Occlusion of the left ICA proximal to the skull base. 2. Diminished flow related enhancement in the left MCA. CTA of the head and neck may provide a more complete assessment.   Impression: Likely atheroembolic stroke in the setting of his known left carotid stenosis which appears to have progressed to total carotid occlusion.  However cannot rule out cardioembolic source at this time.  Suspect his subdurals are due to his recent falls and appreciate neurosurgical management of this issue  Recommendations: -Hold antiplatelet agents in the setting of his subdural hemorrhages -Echocardiogram -Carotid ultrasound -Out of  the window for permissive hypertension, goal normotension -MRA ordered by myself resulted as above -Lipid panel with LDL goal less than 70 (adjust home atorvastatin 40 mg daily if needed), A1c goal less than 7%, adjust medications as needed   Lesleigh Noe MD-PhD [facility] [phone] Available 7 PM to 7 AM, outside of these hours please call Neurologist on call as listed on Amion.

## 2022-10-20 NOTE — ED Notes (Signed)
Patient positioned up in bed eating dinner. Provided with [place] sandwich and gingerale

## 2022-10-20 NOTE — ED Provider Notes (Signed)
[facility] Provider Note   CSN: [id number] Arrival date & time: 10/20/22  1339     History  Chief Complaint  Patient presents with   Weakness    Daniel [name] is a 76 y.o. male with a PMHx of CVA (with right hemiparesis in 2021), who presents to the ED complaining of weakness onset 3 days. Has associated facial droop, slurred speech.  No meds tried prior to arrival.  Denies anticoagulant use.  Denies chest pain, shortness of breath, abdominal pain, nausea, vomiting, numbness, tingling.    The history is provided by the patient. No language interpreter was used.       Home Medications Prior to Admission medications   Medication Sig Start Date End Date Taking? Authorizing Provider  amLODipine (NORVASC) 10 MG tablet TAKE 1 TABLET BY MOUTH EVERY DAY 11/11/21   Vivi Barrack, MD  atorvastatin (LIPITOR) 40 MG tablet TAKE 1 TABLET BY MOUTH EVERY DAY 06/16/21   Vivi Barrack, MD  camphor-menthol [facility]) lotion Apply 1 application topically as needed for itching. 04/30/20   Vivi Barrack, MD  clopidogrel (PLAVIX) 75 MG tablet TAKE 1 TABLET BY MOUTH EVERY DAY 02/02/22   Vivi Barrack, MD  folic acid (FOLVITE) 1 MG tablet TAKE 1 TABLET BY MOUTH EVERY DAY 02/02/22   Vivi Barrack, MD  gabapentin (NEURONTIN) 100 MG capsule TAKE 1 CAPSULE BY MOUTH THREE TIMES A DAY 03/29/20   Vivi Barrack, MD  lisinopril (ZESTRIL) 20 MG tablet TAKE 1 TABLET BY MOUTH EVERY DAY 02/02/22   Vivi Barrack, MD  metoprolol tartrate (LOPRESSOR) 100 MG tablet TAKE 1 TABLET BY MOUTH TWICE A DAY 02/02/22   Vivi Barrack, MD  Multiple Vitamins-Minerals (MULTIVITAMIN WITH MINERALS) tablet Take 1 tablet by mouth daily. 08/17/11   Trish Fountain, MD  polyethylene glycol (MIRALAX / GLYCOLAX) 17 g packet Take 17 g by mouth daily as needed for mild constipation. 12/20/19   Aline August, MD  senna-docusate (SENOKOT-S) 8.6-50 MG tablet Take 1 tablet by mouth 2 (two) times  daily. Patient taking differently: Take 1 tablet by mouth 2 (two) times daily as needed for mild constipation or moderate constipation.  12/20/19   Aline August, MD  tamsulosin (FLOMAX) 0.4 MG CAPS capsule TAKE 1 CAPSULE BY MOUTH EVERY DAY 02/02/22   Vivi Barrack, MD      Allergies    Patient has no known allergies.    Review of Systems   Review of Systems  Neurological:  Positive for weakness.  All other systems reviewed and are negative.   Physical Exam Updated Vital Signs BP (!) 143/74   Pulse (!) 57   Temp 98.5 F (36.9 C) (Oral)   Resp 16   Ht 5\' 9"  (1.753 m)   Wt 86.2 kg   SpO2 100%   BMI 28.06 kg/m  Physical Exam Vitals and nursing note reviewed.  Constitutional:      General: He is not in acute distress.    Appearance: He is not diaphoretic.     Comments: Patient with slurred speech and facial droop to the right on exam.  HENT:     Head: Normocephalic and atraumatic.     Mouth/Throat:     Pharynx: No oropharyngeal exudate.  Eyes:     General: No scleral icterus.    Conjunctiva/sclera: Conjunctivae normal.  Cardiovascular:     Rate and Rhythm: Normal rate and regular rhythm.     Pulses: Normal  pulses.     Heart sounds: Normal heart sounds.  Pulmonary:     Effort: Pulmonary effort is normal. No respiratory distress.     Breath sounds: Normal breath sounds. No wheezing.  Abdominal:     General: Bowel sounds are normal.     Palpations: Abdomen is soft. There is no mass.     Tenderness: There is no abdominal tenderness. There is no guarding or rebound.  Musculoskeletal:        General: Normal range of motion.     Cervical back: Normal range of motion and neck supple.     Comments: Right upper extremity weakness at baseline.  Grip strength 5/5 to left upper extremity.  Strength sensation intact to bilateral lower extremities.  Normal finger-nose testing on the left upper extremity.  Normal heel-to-shin testing bilaterally.  Skin:    General: Skin is warm  and dry.  Neurological:     Mental Status: He is alert.  Psychiatric:        Behavior: Behavior normal.     ED Results / Procedures / Treatments   Labs (all labs ordered are listed, but only abnormal results are displayed) Labs Reviewed  COMPREHENSIVE METABOLIC PANEL - Abnormal; Notable for the following components:      Result Value   Potassium 2.9 (*)    CO2 19 (*)    Glucose, Bld 108 (*)    BUN 41 (*)    Creatinine, Ser 5.85 (*)    Calcium 7.1 (*)    GFR, Estimated 9 (*)    All other components within normal limits  CBC WITH DIFFERENTIAL/PLATELET - Abnormal; Notable for the following components:   RBC 4.09 (*)    Hemoglobin 11.8 (*)    HCT 36.8 (*)    All other components within normal limits  I-STAT CHEM 8, ED - Abnormal; Notable for the following components:   Potassium 2.9 (*)    BUN 39 (*)    Creatinine, Ser 6.40 (*)    Glucose, Bld 107 (*)    Calcium, Ion 0.93 (*)    TCO2 20 (*)    Hemoglobin 12.6 (*)    HCT 37.0 (*)    All other components within normal limits  ETHANOL  PROTIME-INR  RAPID URINE DRUG SCREEN, HOSP PERFORMED  URINALYSIS, ROUTINE W REFLEX MICROSCOPIC    EKG None  Radiology CT Head Wo Contrast  Result Date: 10/20/2022 CLINICAL DATA:  Neuro deficit, acute, stroke suspected EXAM: CT HEAD WITHOUT CONTRAST TECHNIQUE: Contiguous axial images were obtained from the base of the skull through the vertex without intravenous contrast. RADIATION DOSE REDUCTION: This exam was performed according to the departmental dose-optimization program which includes automated exposure control, adjustment of the mA and/or kV according to patient size and/or use of iterative reconstruction technique. COMPARISON:  None Available. BRAIN: BRAIN Cerebral ventricle sizes are concordant with the degree of cerebral volume loss. Patchy and confluent areas of decreased attenuation are noted throughout the deep and periventricular white matter of the cerebral hemispheres  bilaterally, compatible with chronic microvascular ischemic disease. Chronic left frontoparietal and temporal encephalomalacia. No evidence of large-territorial acute infarction. No parenchymal hemorrhage. No mass lesion. Acute density 64mm right subdural hematoma extending along the falx cerebral and tentorium. Query separate 75mm left parafalcine hematoma. No mass effect or midline shift. No hydrocephalus. Basilar cisterns are patent. Vascular: No hyperdense vessel. Atherosclerotic calcifications are present within the cavernous internal carotid and vertebral arteries. Skull: No acute fracture or focal lesion. Sinuses/Orbits: Paranasal sinuses  and mastoid air cells are clear. The orbits are unremarkable. Other: None. IMPRESSION: 1. Acute 6 mm right subdural hematoma extending along the falx cerebri and tentorium. No definite associated mass effect. 2. Query separate 69mm left parafalcine hematoma. These results were called by telephone at the time of interpretation on 10/20/2022 at 5:04 pm to provider [facility] , who verbally acknowledged these results. Electronically Signed   By: Iven Finn M.D.   On: 10/20/2022 17:05    Procedures Procedures    Medications Ordered in ED Medications  potassium chloride 10 mEq in 100 mL IVPB (0 mEq Intravenous Stopped 10/20/22 1730)    ED Course/ Medical Decision Making/ A&P Clinical Course as of 10/20/22 1849  Tue Oct 20, 2022  1703 Consult with radiologist who notes critical of Acute 6 mm right subdural hematoma extending along the, separate 2 mm left subdural. No midline shift or mass effect. [SB]  1715 Consult with Neurologist, Dr. Rory Percy who recommends MRI brain wo contrast and then consult with neurosurgery.  [SB]    Clinical Course User Index [SB] Zinnia Tindall A, PA-C                           Medical Decision Making Amount and/or Complexity of Data Reviewed Labs: ordered. Radiology: ordered.  Risk Prescription drug management.   Pt  presents with concerns for right sided facial droop and slurred speech onset 3 days.  Has a history of CVA in 2021 with right hemiparesis.  No anticoagulants at this time.  Patient afebrile.  On exam patient with Right upper extremity weakness at baseline.  Grip strength 5/5 to left upper extremity.  Strength sensation intact to bilateral lower extremities.  Normal finger-nose testing on the left upper extremity.  Normal heel-to-shin testing bilaterally.  Right-sided facial droop and slurred speech noted on exam.  No acute cardiovascular, respiratory exam findings.  Differential diagnosis includes CVA, subdural hematoma  Co morbidities that complicate the patient evaluation: CVA, hypertension, hyperlipidemia  Labs:  I ordered, and personally interpreted labs.  The pertinent results include:   CBC without leukocytosis I-STAT Chem-8 with potassium decreased at 2.9, creatinine elevated Ethanol undetectable. CMP with hypokalemia 2.9, elevated creatinine at 5.85, BUN elevated at 41, patient repleted with IV fluids.  Imaging: I ordered imaging studies including CT head, MRI brain (ordered results pending at time of signout) I independently visualized and interpreted imaging which showed:  1. Acute 6 mm right subdural hematoma extending along the falx  cerebri and tentorium. No definite associated mass effect.  2. Query separate 64mm left parafalcine hematoma.   I agree with the radiologist interpretation  Medications:  I ordered medication including potassium for repletion I have reviewed the patients home medicines and have made adjustments as needed   Consultations: I requested consultation with the Neurologist, Dr. Malen Gauze and discussed lab and imaging findings as well as pertinent plan - they recommend: MRI brain and consult with Neurosurgeon.   Patient case discussed with Dr. Vanita Panda, at sign-out. Plan at sign-out is pending consult with neurosurgeon and MRI brain, likely Admission to the  hospital, however, plans may change as per oncoming team. Patient care transferred at sign out.     This chart was dictated using voice recognition software, Dragon. Despite the best efforts of this provider to proofread and correct errors, errors may still occur which can change documentation meaning.  Final Clinical Impression(s) / ED Diagnoses Final diagnoses:  Weakness  Subdural hematoma (  [facility])    Rx / DC Orders ED Discharge Orders     None         Sharra Cayabyab A, PA-C 10/20/22 1905    Carmin Muskrat, MD 10/20/22 2102    Carmin Muskrat, MD 10/20/22 2113

## 2022-10-20 NOTE — ED Triage Notes (Signed)
Pt BIB EMS from home for right sided weakness, facial droop, slurred speech. LKW Sunday sometime. Aox4. Wheelchair bound at baseline.

## 2022-10-20 NOTE — ED Notes (Signed)
Unable to provide UA at this time. Primofit in place.

## 2022-10-21 ENCOUNTER — Inpatient Hospital Stay (HOSPITAL_COMMUNITY): Payer: Medicare PPO

## 2022-10-21 DIAGNOSIS — I6521 Occlusion and stenosis of right carotid artery: Secondary | ICD-10-CM | POA: Diagnosis not present

## 2022-10-21 DIAGNOSIS — I6522 Occlusion and stenosis of left carotid artery: Secondary | ICD-10-CM

## 2022-10-21 DIAGNOSIS — I639 Cerebral infarction, unspecified: Secondary | ICD-10-CM | POA: Diagnosis not present

## 2022-10-21 DIAGNOSIS — I6523 Occlusion and stenosis of bilateral carotid arteries: Secondary | ICD-10-CM

## 2022-10-21 DIAGNOSIS — I6389 Other cerebral infarction: Secondary | ICD-10-CM | POA: Diagnosis not present

## 2022-10-21 LAB — ECHOCARDIOGRAM COMPLETE
Area-P 1/2: 4.12 cm2
Calc EF: 45 %
Height: 69 in
S' Lateral: 3.5 cm
Single Plane A2C EF: 33.2 %
Single Plane A4C EF: 50.1 %
Weight: 3040 oz

## 2022-10-21 LAB — LIPID PANEL
Cholesterol: 132 mg/dL (ref 0–200)
HDL: 46 mg/dL (ref >40)
LDL Cholesterol: 65 mg/dL (ref 0–99)
Total CHOL/HDL Ratio: 2.9 RATIO
Triglycerides: 107 mg/dL (ref <150)
VLDL: 21 mg/dL (ref 0–40)

## 2022-10-21 LAB — URINALYSIS, ROUTINE W REFLEX MICROSCOPIC
Bilirubin Urine: NEGATIVE
Glucose, UA: NEGATIVE mg/dL
Ketones, ur: NEGATIVE mg/dL
Nitrite: NEGATIVE
Protein, ur: 100 mg/dL — AB
Specific Gravity, Urine: 1.013 (ref 1.005–1.030)
pH: 5 (ref 5.0–8.0)

## 2022-10-21 LAB — CBC
HCT: 29.4 % — ABNORMAL LOW (ref 39.0–52.0)
Hemoglobin: 10 g/dL — ABNORMAL LOW (ref 13.0–17.0)
MCH: 30.5 pg (ref 26.0–34.0)
MCHC: 34 g/dL (ref 30.0–36.0)
MCV: 89.6 fL (ref 80.0–100.0)
Platelets: 212 10*3/uL (ref 150–400)
RBC: 3.28 MIL/uL — ABNORMAL LOW (ref 4.22–5.81)
RDW: 13.6 % (ref 11.5–15.5)
WBC: 7 10*3/uL (ref 4.0–10.5)
nRBC: 0 % (ref 0.0–0.2)

## 2022-10-21 LAB — VITAMIN D 25 HYDROXY (VIT D DEFICIENCY, FRACTURES): Vit D, 25-Hydroxy: 7.82 ng/mL — ABNORMAL LOW (ref 30–100)

## 2022-10-21 LAB — HEMOGLOBIN A1C
Hgb A1c MFr Bld: 5.5 % (ref 4.8–5.6)
Hgb A1c MFr Bld: 5.5 % (ref 4.8–5.6)
Mean Plasma Glucose: 111.15 mg/dL
Mean Plasma Glucose: 111.15 mg/dL

## 2022-10-21 LAB — BASIC METABOLIC PANEL
Anion gap: 16 — ABNORMAL HIGH (ref 5–15)
BUN: 43 mg/dL — ABNORMAL HIGH (ref 8–23)
CO2: 12 mmol/L — ABNORMAL LOW (ref 22–32)
Calcium: 7.1 mg/dL — ABNORMAL LOW (ref 8.9–10.3)
Chloride: 111 mmol/L (ref 98–111)
Creatinine, Ser: 5.63 mg/dL — ABNORMAL HIGH (ref 0.61–1.24)
GFR, Estimated: 10 mL/min — ABNORMAL LOW (ref >60)
Glucose, Bld: 93 mg/dL (ref 70–99)
Potassium: 3.9 mmol/L (ref 3.5–5.1)
Sodium: 139 mmol/L (ref 135–145)

## 2022-10-21 LAB — MAGNESIUM: Magnesium: 1.5 mg/dL — ABNORMAL LOW (ref 1.7–2.4)

## 2022-10-21 LAB — SODIUM, URINE, RANDOM: Sodium, Ur: 27 mmol/L

## 2022-10-21 LAB — CREATININE, URINE, RANDOM: Creatinine, Urine: 187 mg/dL

## 2022-10-21 MED ORDER — CALCIUM CARBONATE 1250 (500 CA) MG PO TABS
1.0000 | ORAL_TABLET | Freq: Two times a day (BID) | ORAL | Status: DC
  Administered 2022-10-21 – 2022-10-27 (×13): 1250 mg via ORAL
  Filled 2022-10-21 (×14): qty 1

## 2022-10-21 MED ORDER — VITAMIN D (ERGOCALCIFEROL) 1.25 MG (50000 UNIT) PO CAPS
50000.0000 [IU] | ORAL_CAPSULE | ORAL | Status: DC
  Administered 2022-10-21: 50000 [IU] via ORAL
  Filled 2022-10-21 (×2): qty 1

## 2022-10-21 MED ORDER — ASPIRIN 325 MG PO TBEC: 325.0000 mg | DELAYED_RELEASE_TABLET | Freq: Every day | ORAL | Status: DC

## 2022-10-21 MED ORDER — SODIUM CHLORIDE 0.9 % IV SOLN: INTRAVENOUS | Status: DC

## 2022-10-21 MED ORDER — NYSTATIN 100000 UNIT/ML MT SUSP
5.0000 mL | Freq: Four times a day (QID) | OROMUCOSAL | Status: DC
  Administered 2022-10-21 – 2022-10-27 (×21): 500000 [IU] via ORAL
  Filled 2022-10-21 (×24): qty 5

## 2022-10-21 MED ORDER — SODIUM CHLORIDE 0.45 % IV SOLN
INTRAVENOUS | Status: DC
  Filled 2022-10-21 (×4): qty 75

## 2022-10-21 MED ORDER — TAMSULOSIN HCL 0.4 MG PO CAPS
0.4000 mg | ORAL_CAPSULE | Freq: Every day | ORAL | Status: DC
  Administered 2022-10-21 – 2022-10-27 (×7): 0.4 mg via ORAL
  Filled 2022-10-21 (×7): qty 1

## 2022-10-21 MED ORDER — NYSTATIN 100000 UNIT/ML MT SUSP
5.0000 mL | Freq: Four times a day (QID) | OROMUCOSAL | Status: DC
  Administered 2022-10-21 (×2): 500000 [IU] via ORAL
  Filled 2022-10-21 (×6): qty 5

## 2022-10-21 MED ORDER — FOLIC ACID 1 MG PO TABS
1.0000 mg | ORAL_TABLET | Freq: Every day | ORAL | Status: DC
  Administered 2022-10-21 – 2022-10-27 (×7): 1 mg via ORAL
  Filled 2022-10-21 (×7): qty 1

## 2022-10-21 MED ORDER — SODIUM BICARBONATE 650 MG PO TABS
650.0000 mg | ORAL_TABLET | Freq: Three times a day (TID) | ORAL | Status: DC
  Administered 2022-10-21 – 2022-10-27 (×20): 650 mg via ORAL
  Filled 2022-10-21 (×20): qty 1

## 2022-10-21 MED ORDER — CLOPIDOGREL BISULFATE 75 MG PO TABS: 75.0000 mg | ORAL_TABLET | Freq: Every day | ORAL | Status: DC

## 2022-10-21 MED ORDER — SODIUM CHLORIDE 0.9 % IV BOLUS
1000.0000 mL | Freq: Once | INTRAVENOUS | Status: AC
  Administered 2022-10-21: 1000 mL via INTRAVENOUS

## 2022-10-21 MED ORDER — HEPARIN SODIUM (PORCINE) 5000 UNIT/ML IJ SOLN
5000.0000 [IU] | Freq: Three times a day (TID) | INTRAMUSCULAR | Status: DC
  Administered 2022-10-21 – 2022-10-27 (×18): 5000 [IU] via SUBCUTANEOUS
  Filled 2022-10-21 (×18): qty 1

## 2022-10-21 MED ORDER — HYDRALAZINE HCL 20 MG/ML IJ SOLN: 5.0000 mg | Freq: Four times a day (QID) | INTRAMUSCULAR | Status: DC | PRN

## 2022-10-21 MED ORDER — ATORVASTATIN CALCIUM 40 MG PO TABS
40.0000 mg | ORAL_TABLET | Freq: Every day | ORAL | Status: DC
  Administered 2022-10-21 – 2022-10-27 (×7): 40 mg via ORAL
  Filled 2022-10-21 (×7): qty 1

## 2022-10-21 NOTE — Consult Note (Signed)
[name]  Requesting MD: Dr. Niel Hummer Reason for consult: AKI on CKD  HPI:  Daniel [name] is a 76 y.o. male with past medical history significant for hypertension, HLD, BPH, prior stroke with residual right hemiparesis, CKD 3B, presented with right sided facial droop and slurred speech, seen as a consultation for the evaluation of acute kidney injury on CKD. It seems like patient is wheelchair-bound and lives at home with her sister.  He does have right-sided hemiparesis however noted slurredand facial droop for about 3 days.  MRI showed acute ischemia and bilateral subdural hematomas right greater than the left. The labs showed creatinine level of 5.85, BUN 41, potassium 2.9, hemoglobin 11.8 on admission.  The serum creatinine level was around 2.2 back in 2021 and then we do not have any lab results for about 2 and half hours until this presentation.  The US renal showed mildly increased echogenicity and volume loss in both kidneys, no hydronephrosis. He was seen by neurosurgeon who recommended no intervention or follow-up required.  Recommended only aspirin alone for anticoagulation.  Also seen by stroke team and planning for carotid Doppler, echo and rehab. He received potassium chloride in ER and started IV sodium bicarbonate.  The external urinary catheter was placed in ER with around 500 cc collected. He is on lisinopril, metoprolol and amlodipine at home. Patient is currently alert awake and oriented.  He is slow to respond.  He denies nausea, vomiting, dysgeusia, chest pain or shortness of breath.  He does not have any edema.  PMHx:   Past Medical History:  Diagnosis Date   Hypertension    Psychosis ([place])    Schizophrenia ([place]) 1974   Stroke ([place]) 2010   Right Hemiparesis    Past Surgical History:  Procedure Laterality Date   FEMUR IM NAIL Right 12/12/2019   Procedure: INTRAMEDULLARY (IM) NAIL FEMORAL;  Surgeon: Renette Butters, MD;  Location: [place];  Service: Orthopedics;  Laterality: Right;    Family Hx:  Family History  Problem Relation Age of Onset   Hypertension Mother    CVA Sister    Breast cancer Sister    Prostate cancer Brother     Social History:  reports that he has quit smoking. His smoking use included cigarettes. He smoked an average of .1 packs per day. He has never used smokeless tobacco. He reports that he does not currently use alcohol. He reports that he does not currently use drugs.  Allergies: No Known Allergies  Medications: Prior to Admission medications   Medication Sig Start Date End Date Taking? Authorizing Provider  amLODipine (NORVASC) 10 MG tablet TAKE 1 TABLET BY MOUTH EVERY DAY Patient taking differently: Take 10 mg by mouth daily. 11/11/21  Yes Vivi Barrack, MD  atorvastatin (LIPITOR) 40 MG tablet TAKE 1 TABLET BY MOUTH EVERY DAY Patient taking differently: Take 40 mg by mouth daily. 06/16/21  Yes Vivi Barrack, MD  clopidogrel (PLAVIX) 75 MG tablet TAKE 1 TABLET BY MOUTH EVERY DAY Patient taking differently: Take 75 mg by mouth daily. 02/02/22  Yes Vivi Barrack, MD  Emollient (EUCERIN) lotion Apply 1 Application topically as needed for dry skin.   Yes [provider]  folic acid (FOLVITE) 1 MG tablet TAKE 1 TABLET BY MOUTH EVERY DAY Patient taking differently: Take 1 mg by mouth daily. 02/02/22  Yes Vivi Barrack, MD  lisinopril (ZESTRIL) 20 MG tablet TAKE 1 TABLET BY MOUTH EVERY DAY  Patient taking differently: Take 20 mg by mouth daily. 02/02/22  Yes Vivi Barrack, MD  metoprolol tartrate (LOPRESSOR) 100 MG tablet TAKE 1 TABLET BY MOUTH TWICE A DAY Patient taking differently: Take 100 mg by mouth 2 (two) times daily. 02/02/22  Yes Vivi Barrack, MD  tamsulosin (FLOMAX) 0.4 MG CAPS capsule TAKE 1 CAPSULE BY MOUTH EVERY DAY Patient taking differently: Take 0.4 mg by mouth daily. 02/02/22  Yes Vivi Barrack, MD    I have reviewed the patient's  current medications.  Labs: Renal Panel: Recent Labs    10/20/22 1453 10/20/22 1501 10/21/22 0435  NA 137 140 139  K 2.9* 2.9* 3.9  CL 105 106 111  CO2 19*  --  12*  GLUCOSE 108* 107* 93  BUN 41* 39* 43*  CREATININE 5.85* 6.40* 5.63*  CALCIUM 7.1*  --  7.1*  ALBUMIN 3.7  --   --      CBC:    Latest Ref Rng & Units 10/21/2022    4:35 AM 10/20/2022    3:01 PM 10/20/2022    2:53 PM  CBC  WBC 4.0 - 10.5 K/uL 7.0   6.1   Hemoglobin 13.0 - 17.0 g/dL 10.0  12.6  11.8   Hematocrit 39.0 - 52.0 % 29.4  37.0  36.8   Platelets 150 - 400 K/uL 212   249      Anemia Panel:  Recent Labs    10/20/22 1453 10/20/22 1501 10/21/22 0435  HGB 11.8* 12.6* 10.0*  MCV 90.0  --  89.6    Recent Labs  Lab 10/20/22 1453  AST 26  ALT 19  ALKPHOS 81  BILITOT 0.5  PROT 7.2  ALBUMIN 3.7    Lab Results  Component Value Date   HGBA1C 5.5 10/21/2022    ROS:  Pertinent items noted in HPI and remainder of comprehensive ROS otherwise negative.  Physical Exam: Vitals:   10/21/22 1100 10/21/22 1130  BP: (!) 146/80 (!) 143/75  Pulse: 73 63  Resp: 17 13  Temp:    SpO2: 100% 99%     General exam: Appears calm and comfortable, lying comfortable.  Dry mucous membrane. Respiratory system: Clear to auscultation. Respiratory effort normal. No wheezing or crackle Cardiovascular system: S1 & S2 heard, RRR.  No pedal edema. Gastrointestinal system: Abdomen is nondistended, soft and nontender. Normal bowel sounds heard. Central nervous system: Alert and oriented. No focal neurological deficits. Extremities: Right hemiparesis, no edema. Skin: No rashes, lesions or ulcers Psychiatry: alert awake and oriented.  Assessment/Plan:  #Acute kidney injury on CKD stage IIIb/IV: cr around 2.2-2.7 in 2021, no recent baseline creatinine level available.  This is likely acute kidney injury due to hemodynamically mediated in the setting of decreased oral intake concomitant with the use of ACE  inhibitor.  He looks dry and dehydrated on exam.  I will order a liter of NS bolus and continue with maintenance sodium bicarbonate fluid.  Kidney ultrasound with chronic finding, no hydronephrosis.  I will do bladder scan.  He has external urinary catheter with already 500 cc of urine.  Continue to hold ACE inhibitor.  Daily lab and strict ins and out.  No acute need for dialysis today.  #Acute left MCA ischemic stroke/acute bilateral SDH: No intervention per neurosurgeon.  Seen by neurology and planning carotid Doppler, echo.  PT OT and i rehab treatment.  Per NS, only aspirin for anticoagulation.  #Hypertension: Need permissive hypertension because of acute stroke.  Antihypertensive on  hold currently.  Monitor BP.  #Metabolic acidosis due to renal failure: Starting IV sodium bicarbonate.  #History of BPH: On Flomax, second bladder scan.  US renal without hydronephrosis.  #Hypokalemia: Received IV potassium chloride.  Monitor lab.  Check magnesium level.  Discussed with ER nurse. Thank you for the consult.  We will continue to follow.   Quinn Bartling Tanna Furry 10/21/2022, 2:00 PM  [employer].

## 2022-10-21 NOTE — Progress Notes (Signed)
Neurosurgery  76 yo M with hx of CVA with right hemiparesis, CKD, schizophrenia developed new right-sided stroke symptoms.  He also had a fall last week and has chronic ambulatory difficulties.  He was found to have an acute L CVA with right ICA occlusion.  Imaging also showed thin right posterior convexity and falcotentorial SDH. No neurosurgical intervention or f/u required.  However, anticoagulation would be relatively contraindicated in this setting.  Rather than DAPT, aspirin alone is likely safe.

## 2022-10-21 NOTE — Progress Notes (Addendum)
STROKE TEAM PROGRESS NOTE   INTERVAL HISTORY No family at the bedside. He is awake and alert, oriented to self, month, age and city. Unable to state correct year. Slight leftward gaze, right eye does not cross completely to the right. PERRL. He has right side facial droop, right hemiparesis. Right arm he is able to slightly move, unable to hold up even with gravity eliminated, no grip in right hand, left arm 5/5, bilateral legs 4/5 He states this increased weakness on the right has been ongoing for a few days. Normally he can do things with right arm and hand.   Vitals:   10/21/22 0430 10/21/22 0500 10/21/22 0530 10/21/22 0600  BP: 126/72 (!) 146/106 110/66 126/71  Pulse: (!) 58 (!) 58 (!) 52 (!) 56  Resp: 12 14 11 11   Temp:      TempSrc:      SpO2: 99% 99% 99% 99%  Weight:      Height:       CBC:  Recent Labs  Lab 10/20/22 1453 10/20/22 1501 10/21/22 0435  WBC 6.1  --  7.0  NEUTROABS 4.8  --   --   HGB 11.8* 12.6* 10.0*  HCT 36.8* 37.0* 29.4*  MCV 90.0  --  89.6  PLT 249  --  102   Basic Metabolic Panel:  Recent Labs  Lab 10/20/22 1453 10/20/22 1501 10/21/22 0435  NA 137 140 139  K 2.9* 2.9* 3.9  CL 105 106 111  CO2 19*  --  12*  GLUCOSE 108* 107* 93  BUN 41* 39* 43*  CREATININE 5.85* 6.40* 5.63*  CALCIUM 7.1*  --  7.1*   Lipid Panel:  Recent Labs  Lab 10/21/22 0435  CHOL 132  TRIG 107  HDL 46  CHOLHDL 2.9  VLDL 21  LDLCALC 65   HgbA1c: No results for input(s): "HGBA1C" in the last 168 hours. Urine Drug Screen: No results for input(s): "LABOPIA", "COCAINSCRNUR", "LABBENZ", "AMPHETMU", "THCU", "LABBARB" in the last 168 hours.  Alcohol Level  Recent Labs  Lab 10/20/22 1453  ETH <10    IMAGING past 24 hours US RENAL  Result Date: 10/21/2022 CLINICAL DATA:  [id number] with acute kidney injury. EXAM: RENAL / URINARY TRACT ULTRASOUND COMPLETE COMPARISON:  Renal ultrasound 12/11/2019 FINDINGS: Right Kidney: Renal measurements: 9.1 x 4.3 x 3.7 cm = volume:  74.5 mL, previously 89 mL. Echogenicity in general mildly increased as before. No mass, stones or hydronephrosis visualized. Left Kidney: Renal measurements: 10.2 x 4.4 x 4.1 = volume: 98.3 mL, previously 125 mL. Echogenicity in general mildly increased as before. There is a 3 cm simple cyst in the inferior pole, stable. No mass or hydronephrosis visualized. Bladder: Appears normal for degree of bladder distention. Other: None. IMPRESSION: 1. No acute findings. No hydronephrosis. 2. Mildly increased echogenicity of both kidneys as before, compatible with medical renal disease. 3. Stable 3 cm simple cyst in the inferior pole of the left kidney. 4. By measurements there appears to be a minimal to mild interval volume loss in both kidneys Electronically Signed   By: Telford Nab M.D.   On: 10/21/2022 00:51   MR ANGIO HEAD WO CONTRAST  Result Date: 10/20/2022 CLINICAL DATA:  Stroke or TIA EXAM: MRA HEAD WITHOUT CONTRAST TECHNIQUE: Angiographic images of the Circle of Willis were acquired using MRA technique without intravenous contrast. COMPARISON:  None Available. FINDINGS: POSTERIOR CIRCULATION: --Vertebral arteries: Normal --Inferior cerebellar arteries: Normal. --Basilar artery: Normal. --Superior cerebellar arteries: Normal. --Posterior cerebral arteries: Normal.  ANTERIOR CIRCULATION: --Intracranial internal carotid arteries: Loss of normal flow related enhancement within the left internal carotid artery. Diminished flow related enhancement of the distal intracranial ICA. --Anterior cerebral arteries (ACA): Normal. --Middle cerebral arteries (MCA): Poor flow related enhancement at the left MCA. Right MCA is normal. ANATOMIC VARIANTS: None IMPRESSION: 1. Occlusion of the left ICA proximal to the skull base. 2. Diminished flow related enhancement in the left MCA. CTA of the head and neck may provide a more complete assessment. Electronically Signed   By: Ulyses Jarred M.D.   On: 10/20/2022 23:32   MR BRAIN  WO CONTRAST  Result Date: 10/20/2022 CLINICAL DATA:  Acute neurologic deficit EXAM: MRI HEAD WITHOUT CONTRAST TECHNIQUE: Multiplanar, multiecho pulse sequences of the brain and surrounding structures were obtained without intravenous contrast. COMPARISON:  07/31/2009 FINDINGS: Brain: Large area of acute ischemia within the anterior left MCA territory. There are bilateral subdural hematomas right greater than left. There is confluent hyperintense T2-weighted signal within the white matter. Generalized volume loss. Encephalomalacia at the site of old left anterior cerebral artery infarct. The midline structures are normal. Vascular: Major flow voids are preserved. Skull and upper cervical spine: Normal calvarium and skull base. Visualized upper cervical spine and soft tissues are normal. Sinuses/Orbits:No paranasal sinus fluid levels or advanced mucosal thickening. No mastoid or middle ear effusion. Normal orbits. IMPRESSION: 1. Large area of acute ischemia within the anterior left MCA territory. No hemorrhage or mass effect. 2. Bilateral subdural hematomas right greater than left. Electronically Signed   By: Ulyses Jarred M.D.   On: 10/20/2022 20:18   CT Head Wo Contrast  Result Date: 10/20/2022 CLINICAL DATA:  Neuro deficit, acute, stroke suspected EXAM: CT HEAD WITHOUT CONTRAST TECHNIQUE: Contiguous axial images were obtained from the base of the skull through the vertex without intravenous contrast. RADIATION DOSE REDUCTION: This exam was performed according to the departmental dose-optimization program which includes automated exposure control, adjustment of the mA and/or kV according to patient size and/or use of iterative reconstruction technique. COMPARISON:  None Available. BRAIN: BRAIN Cerebral ventricle sizes are concordant with the degree of cerebral volume loss. Patchy and confluent areas of decreased attenuation are noted throughout the deep and periventricular white matter of the cerebral  hemispheres bilaterally, compatible with chronic microvascular ischemic disease. Chronic left frontoparietal and temporal encephalomalacia. No evidence of large-territorial acute infarction. No parenchymal hemorrhage. No mass lesion. Acute density 25mm right subdural hematoma extending along the falx cerebral and tentorium. Query separate 66mm left parafalcine hematoma. No mass effect or midline shift. No hydrocephalus. Basilar cisterns are patent. Vascular: No hyperdense vessel. Atherosclerotic calcifications are present within the cavernous internal carotid and vertebral arteries. Skull: No acute fracture or focal lesion. Sinuses/Orbits: Paranasal sinuses and mastoid air cells are clear. The orbits are unremarkable. Other: None. IMPRESSION: 1. Acute 6 mm right subdural hematoma extending along the falx cerebri and tentorium. No definite associated mass effect. 2. Query separate 85mm left parafalcine hematoma. These results were called by telephone at the time of interpretation on 10/20/2022 at 5:04 pm to provider [facility] , who verbally acknowledged these results. Electronically Signed   By: Iven Finn M.D.   On: 10/20/2022 17:05    PHYSICAL EXAM  Temp:  [98.1 F (36.7 C)-98.9 F (37.2 C)] 98.1 F (36.7 C) (11/15 1022) Pulse Rate:  [37-73] 63 (11/15 1130) Resp:  [11-21] 13 (11/15 1130) BP: (110-166)/(60-106) 143/75 (11/15 1130) SpO2:  [97 %-100 %] 99 % (11/15 1130) Weight:  [86.2 kg] 86.2  kg (11/14 1347)  General - frail elderly male in NAD Cardiovascular - Regular rhythm and rate.  Mental Status -  Level of arousal and orientation to time, place, and person were intact.Unable to state correct year. NO aphasia, mild dysarthria  Language including expression, naming, repetition, comprehension was assessed and found intact. Attention span and concentration were normal. Recent and remote memory were intact. Fund of Knowledge was assessed and was intact.  Cranial Nerves II - XII - II -  Visual field intact OU. III, IV, VI - Extraocular movements Slight leftward gaze, right eye does not cross completely to the right.. V - Facial sensation intact bilaterally. VII - right facial droop. VIII - Hearing & vestibular intact bilaterally. X - Palate elevates symmetrically. XI - Chin turning & shoulder shrug intact bilaterally. XII - Tongue protrusion intact.  Motor Strength - right hemiparesis. Right arm he is able to slightly move, unable to hold up even with gravity eliminated, no grip in right hand, left arm 5/5, bilateral legs 4/5 Motor Tone - decreased Sensory - Light touch, temperature/pinprick were assessed and were symmetrical.   Coordination - The patient had normal movements in the hands and feet with no ataxia or dysmetria.  Tremor was absent. Gait and Station - deferred.  ASSESSMENT/PLAN Mr. EMMANUEL GRUENHAGEN is a 76 y.o. male with history of hypertension, hyperlipidemia, stroke with residual right hemiparesis, CKD stage IIIb-IV, schizophrenia, BPH presented to the ED via EMS with right-sided facial droop and slurred speech. LKW 3 days ago.   Stroke:  Acute left MCA ischemic infarct likely due to chronic left ICA occlusion in the setting of AKI on CKD and hypotension  CT head Acute 6 mm right subdural hematoma extending along the falx cerebri and tentorium.  MRI  Large area of acute ischemia within the anterior left MCA territory. Bilateral subdural hematomas right greater than left. MRA Occlusion of the left ICA proximal to the skull base. Diminished flow related enhancement in the left MCA.  Carotid Doppler total occlusion left ICA, right ICA 40 to 59% stenosis 2D Echo EF 60 to 65% LDL 65 HgbA1c 5.5 VTE prophylaxis -heparin subcu clopidogrel 75 mg daily prior to admission, now on No antithrombotic due to bilateral ICH. NSG ok with ASA, recommend to repeat CT in 3 days, if SDH stable, will start ASA 81.  Therapy recommendations:  CIR Disposition:  pending  Acute  Bilateral SDH  NSGY consulted - no surgical intervention at this time  OK with ASA alone rather than DAPT Likely traumatic from fall Will repeat CT head in 3 days for stability and consider starting ASA at that time   Left ICA occlusion Right ICA stenosis Hx of stroke Per chart, pt had stroke in 2012 with left ICA stenosis, plan for left CEA in 6-8 weeks but eventually did not happen MRA Occlusion of the left ICA proximal to the skull base.  Carotid Doppler total occlusion left ICA, right ICA 40 to 59% stenosis No intervention needed at this time Follow up with [facility] as outpt  Hypertension Home meds:  norvasc 10mg , lisinopril 20 mg, metoprolol 100 mg  Stable BP goal 130-150 Avoid low BP Long-term BP goal normotensive  Hyperlipidemia Home meds:  atorvastatin 40mg , resumed in hospital LDL 65, goal < 70 Continue statin at discharge  Other Stroke Risk Factors Advanced Age >/= 72   Other Active Problems AKI on CKD - nephrology on board [facility] day # [place] DNP, [facility]  ATTENDING NOTE: I reviewed above note and agree with the assessment and plan. Pt was seen and examined.   76 year old male with history of hypertension, hyperlipidemia, PVD, CKD, previous alcohol abuse and smoker, stroke in 2012 admitted for left-sided weakness and fell from wheelchair during transition.   Per chart, pt had stroke in 2012 with left ICA stenosis, plan for left CEA in 6-8 weeks but eventually did not happen.  On this admission CT showed bilateral small thin SDH, likely traumatic from fall.  MRI again showed bilateral small SDH, acute left MCA infarct with chronic left MCA infarcts.  MRI showed left ICA occlusion and decreased MCA flow.  Carotid Doppler showed total left ICA occlusion, right ICA 40 to 59% stenosis, EF 60 to 65%, LDL 65, A1c 5.5, UDS pending.  Creatinine 6.4-5.63.  On exam, no family is at the bedside. Pt awake, alert, eyes open, orientated to age, place, but not to  time. No aphasia, following all simple commands but moderate dysarthria, paucity of speech. Able to name and repeat in dysarthric voice. No gaze palsy, tracking bilaterally, visual field not cooperative with constant eye movement but blinking to visual threat bilaterally. Right obvious facial droop. Tongue midline. LUE 4/5 at least, RUE proximal 0/5, bicep 2/5, bicep 1/5, finger movement 0/5 with increased muscle tone. LLE 3/5, RLE 3-/5 proximally and 2/5 distally. Sensation symmetrical bilaterally, left FTN intact grossly, gait not tested.   Etiology for patient stroke likely due to large vessel disease given left ICA occlusion in the setting of AKI on CKD, decreased p.o. intake, dehydration, and hypotension.  Recommend BP goal 1 30-1 50, avoid low BP.  Nephrology on board for AKI, on IV fluid.  Currently no antithrombotic given bilateral small SDH.  Recommend repeat CT in 3 days, if stable, will add aspirin 81.  Continue home statin.  PT/OT recommend CIR.  Follow with [facility] as outpatient for right carotid stenosis.  We will follow  For detailed assessment and plan, please refer to above/below as I have made changes wherever appropriate.   Rosalin Hawking, MD PhD Stroke Neurology 10/21/2022 3:39 PM    To contact Stroke Continuity provider, please refer to [email or web address] After hours, contact General Neurology

## 2022-10-21 NOTE — Progress Notes (Signed)
Inpatient Rehab Admissions Coordinator:  ? ?Per therapy recommendations,  patient was screened for CIR candidacy by Darryel Diodato, MS, CCC-SLP. At this time, Pt. Appears to be a a potential candidate for CIR. I will place   order for rehab consult per protocol for full assessment. Please contact me any with questions. ? ?Sophiana Milanese, MS, CCC-SLP ?Rehab Admissions Coordinator  ?[phone] (celll) ?[phone] (office) ? ?

## 2022-10-21 NOTE — ED Notes (Signed)
ED TO INPATIENT HANDOFF REPORT  ED Nurse Name and Phone #: Tayshon Winker RN [address] Name/Age/Gender Daniel [name] 76 y.o. male Room/Bed: 005C/005C  Code Status   Code Status: Full Code  Home/SNF/Other Rehab Patient oriented to: self, place, time, and situation Is this baseline? Yes   Triage Complete: Triage complete  Chief Complaint Acute CVA (cerebrovascular accident) [facility]) [I63.9]  Triage Note Pt BIB EMS from home for right sided weakness, facial droop, slurred speech. LKW Sunday sometime. Aox4. Wheelchair bound at baseline.    Allergies No Known Allergies  Level of Care/Admitting Diagnosis ED Disposition     ED Disposition  Admit   Condition  --   Comment  Hospital Area: [place] [[id number]]  Level of Care: Progressive [102]  Admit to Progressive based on following criteria: NEUROLOGICAL AND NEUROSURGICAL complex patients with significant risk of instability, who do not meet ICU criteria, yet require close observation or frequent assessment (< / = every 2 - 4 hours) with medical / nursing intervention.  May admit patient to [facility] or [facility] if equivalent level of care is available:: Yes  Covid Evaluation: Asymptomatic - no recent exposure (last 10 days) testing not required  Diagnosis: Acute CVA (cerebrovascular accident) [facility]) [[id number]]  Admitting Physician: Shela Leff [[id number]]  Attending Physician: Shela Leff [[id number]]  Certification:: I certify this patient will need inpatient services for at least 2 midnights          B Medical/Surgery History Past Medical History:  Diagnosis Date   Hypertension    Psychosis ([place])    Schizophrenia ([place]) 1974   Stroke ([place]) 2010   Right Hemiparesis   Past Surgical History:  Procedure Laterality Date   FEMUR IM NAIL Right 12/12/2019   Procedure: INTRAMEDULLARY (IM) NAIL FEMORAL;  Surgeon: Renette Butters, MD;  Location: [place];  Service: Orthopedics;  Laterality: Right;      A IV Location/Drains/Wounds Patient Lines/Drains/Airways Status     Active Line/Drains/Airways     Name Placement date Placement time Site Days   Peripheral IV 10/20/22 18 G Anterior;Left Forearm 10/20/22  1341  Forearm  1   External Urinary Catheter 10/20/22  1857  --  1   Incision (Closed) 12/12/19 Hip Right 12/12/19  1326  -- 1044   Pressure Injury 12/27/19 Buttocks Posterior Stage 2 -  Partial thickness loss of dermis presenting as a shallow open injury with a red, pink wound bed without slough.  12/27/19  2203  -- 1029            Intake/Output Last 24 hours  Intake/Output Summary (Last 24 hours) at 10/21/2022 1516 Last data filed at 10/21/2022 0304 Gross per 24 hour  Intake 402.09 ml  Output --  Net 402.09 ml    Labs/Imaging Results for orders placed or performed during the hospital encounter of 10/20/22 (from the past 48 hour(s))  Ethanol     Status: None   Collection Time: 10/20/22  2:53 PM  Result Value Ref Range   Alcohol, Ethyl (B) <10 <10 mg/dL    Comment: (NOTE) Lowest detectable limit for serum alcohol is 10 mg/dL.  For medical purposes only. Performed at [facility], [place] [address], [place], [place] [address]   Protime-INR     Status: None   Collection Time: 10/20/22  2:53 PM  Result Value Ref Range   Prothrombin Time 13.7 11.4 - 15.2 seconds   INR 1.1 0.8 - 1.2    Comment: (  NOTE) INR goal varies based on device and disease states. Performed at [facility], [place] [address], [place], [place] [address]   Comprehensive metabolic panel     Status: Abnormal   Collection Time: 10/20/22  2:53 PM  Result Value Ref Range   Sodium 137 135 - 145 mmol/L   Potassium 2.9 (L) 3.5 - 5.1 mmol/L   Chloride 105 98 - 111 mmol/L   CO2 19 (L) 22 - 32 mmol/L   Glucose, Bld 108 (H) 70 - 99 mg/dL    Comment: Glucose reference range applies only to samples taken after fasting for at least 8 hours.   BUN 41 (H) 8 - 23 mg/dL   Creatinine, Ser 5.85 (H)  0.61 - 1.24 mg/dL   Calcium 7.1 (L) 8.9 - 10.3 mg/dL   Total Protein 7.2 6.5 - 8.1 g/dL   Albumin 3.7 3.5 - 5.0 g/dL   AST 26 15 - 41 U/L   ALT 19 0 - 44 U/L   Alkaline Phosphatase 81 38 - 126 U/L   Total Bilirubin 0.5 0.3 - 1.2 mg/dL   GFR, Estimated 9 (L) >60 mL/min    Comment: (NOTE) Calculated using the CKD-EPI Creatinine Equation (2021)    Anion gap 13 5 - 15    Comment: Performed at [facility] [address] [place], [place] [address]  CBC with Differential     Status: Abnormal   Collection Time: 10/20/22  2:53 PM  Result Value Ref Range   WBC 6.1 4.0 - 10.5 K/uL   RBC 4.09 (L) 4.22 - 5.81 MIL/uL   Hemoglobin 11.8 (L) 13.0 - 17.0 g/dL   HCT 36.8 (L) 39.0 - 52.0 %   MCV 90.0 80.0 - 100.0 fL   MCH 28.9 26.0 - 34.0 pg   MCHC 32.1 30.0 - 36.0 g/dL   RDW 13.3 11.5 - 15.5 %   Platelets 249 150 - 400 K/uL   nRBC 0.0 0.0 - 0.2 %   Neutrophils Relative % 78 %   Neutro Abs 4.8 1.7 - 7.7 K/uL   Lymphocytes Relative 13 %   Lymphs Abs 0.8 0.7 - 4.0 K/uL   Monocytes Relative 8 %   Monocytes Absolute 0.5 0.1 - 1.0 K/uL   Eosinophils Relative 1 %   Eosinophils Absolute 0.0 0.0 - 0.5 K/uL   Basophils Relative 0 %   Basophils Absolute 0.0 0.0 - 0.1 K/uL   WBC Morphology MORPHOLOGY UNREMARKABLE    RBC Morphology MORPHOLOGY UNREMARKABLE    Smear Review Normal platelet morphology    Immature Granulocytes 0 %   Abs Immature Granulocytes 0.02 0.00 - 0.07 K/uL    Comment: Performed at [facility], [address], [place], [place] [address]  Hemoglobin A1c     Status: None   Collection Time: 10/20/22  2:53 PM  Result Value Ref Range   Hgb A1c MFr Bld 5.5 4.8 - 5.6 %    Comment: (NOTE) Pre diabetes:          5.7%-6.4%  Diabetes:              >6.4%  Glycemic control for   <7.0% adults with diabetes    Mean Plasma Glucose 111.15 mg/dL    Comment: Performed at [facility] [address], [place],  [address]  I-stat chem 8, ED     Status: Abnormal    Collection Time: 10/20/22  3:01 PM  Result Value Ref Range  Sodium 140 135 - 145 mmol/L   Potassium 2.9 (L) 3.5 - 5.1 mmol/L   Chloride 106 98 - 111 mmol/L   BUN 39 (H) 8 - 23 mg/dL   Creatinine, Ser 6.40 (H) 0.61 - 1.24 mg/dL   Glucose, Bld 107 (H) 70 - 99 mg/dL    Comment: Glucose reference range applies only to samples taken after fasting for at least 8 hours.   Calcium, Ion 0.93 (L) 1.15 - 1.40 mmol/L   TCO2 20 (L) 22 - 32 mmol/L   Hemoglobin 12.6 (L) 13.0 - 17.0 g/dL   HCT 37.0 (L) 39.0 - 52.0 %  Lipid panel     Status: None   Collection Time: 10/21/22  4:35 AM  Result Value Ref Range   Cholesterol 132 0 - 200 mg/dL   Triglycerides 107 <150 mg/dL   HDL 46 >40 mg/dL   Total CHOL/HDL Ratio 2.9 RATIO   VLDL 21 0 - 40 mg/dL   LDL Cholesterol 65 0 - 99 mg/dL    Comment:        Total Cholesterol/HDL:CHD Risk Coronary Heart Disease Risk Table                     Men   Women  1/2 Average Risk   3.4   3.3  Average Risk       5.0   4.4  2 X Average Risk   9.6   7.1  3 X Average Risk  23.4   11.0        Use the calculated Patient Ratio above and the CHD Risk Table to determine the patient's CHD Risk.        ATP III CLASSIFICATION (LDL):  <100     mg/dL   Optimal  100-129  mg/dL   Near or Above                    Optimal  130-159  mg/dL   Borderline  160-189  mg/dL   High  >190     mg/dL   Very High Performed at [facility] [address] [place], [place] [address]   CBC     Status: Abnormal   Collection Time: 10/21/22  4:35 AM  Result Value Ref Range   WBC 7.0 4.0 - 10.5 K/uL   RBC 3.28 (L) 4.22 - 5.81 MIL/uL   Hemoglobin 10.0 (L) 13.0 - 17.0 g/dL   HCT 29.4 (L) 39.0 - 52.0 %   MCV 89.6 80.0 - 100.0 fL   MCH 30.5 26.0 - 34.0 pg   MCHC 34.0 30.0 - 36.0 g/dL   RDW 13.6 11.5 - 15.5 %   Platelets 212 150 - 400 K/uL   nRBC 0.0 0.0 - 0.2 %    Comment: Performed at [facility], [place] [address], [place], [place] [address]  VITAMIN D 25 Hydroxy (Vit-D  Deficiency, Fractures)     Status: Abnormal   Collection Time: 10/21/22  4:35 AM  Result Value Ref Range   Vit D, 25-Hydroxy 7.82 (L) 30 - 100 ng/mL    Comment: (NOTE) Vitamin D deficiency has been defined by the [place] practice guideline as a level of serum 25-OH  vitamin D less than 20 ng/mL (1,2). The Endocrine Society went on to  further define vitamin D insufficiency as a level between 21 and 29  ng/mL (2).  1. IOM (Institute of Medicine). 2010.  Dietary reference intakes for  calcium and D. [place]: The [employer]. 2. Holick MF, Binkley [place], Bischoff-Ferrari HA, et al. Evaluation,  treatment, and prevention of vitamin D deficiency: an Endocrine  Society clinical practice guideline, JCEM. 2011 Jul; 96(7): 1911-30.  Performed at [facility], [place] [address] [place], [place] [address]   Basic metabolic panel     Status: Abnormal   Collection Time: 10/21/22  4:35 AM  Result Value Ref Range   Sodium 139 135 - 145 mmol/L   Potassium 3.9 3.5 - 5.1 mmol/L   Chloride 111 98 - 111 mmol/L   CO2 12 (L) 22 - 32 mmol/L   Glucose, Bld 93 70 - 99 mg/dL    Comment: Glucose reference range applies only to samples taken after fasting for at least 8 hours.   BUN 43 (H) 8 - 23 mg/dL   Creatinine, Ser 5.63 (H) 0.61 - 1.24 mg/dL   Calcium 7.1 (L) 8.9 - 10.3 mg/dL   GFR, Estimated 10 (L) >60 mL/min    Comment: (NOTE) Calculated using the CKD-EPI Creatinine Equation (2021)    Anion gap 16 (H) 5 - 15    Comment: Performed at [facility] [address], [place], [place] [address]  Hemoglobin A1c     Status: None   Collection Time: 10/21/22  6:25 AM  Result Value Ref Range   Hgb A1c MFr Bld 5.5 4.8 - 5.6 %    Comment: (NOTE) Pre diabetes:          5.7%-6.4%  Diabetes:              >6.4%  Glycemic control for   <7.0% adults with diabetes    Mean Plasma Glucose 111.15 mg/dL    Comment: Performed at [facility] [address], [place], [place] [address]  Urinalysis, Routine w reflex microscopic     Status: Abnormal   Collection Time: 10/21/22 10:57 AM  Result Value Ref Range   Color, Urine YELLOW YELLOW   APPearance HAZY (A) CLEAR   Specific Gravity, Urine 1.013 1.005 - 1.030   pH 5.0 5.0 - 8.0   Glucose, UA NEGATIVE NEGATIVE mg/dL   Hgb urine dipstick LARGE (A) NEGATIVE   Bilirubin Urine NEGATIVE NEGATIVE   Ketones, ur NEGATIVE NEGATIVE mg/dL   Protein, ur 100 (A) NEGATIVE mg/dL   Nitrite NEGATIVE NEGATIVE   Leukocytes,Ua MODERATE (A) NEGATIVE   RBC / HPF 21-50 0 - 5 RBC/hpf   WBC, UA 21-50 0 - 5 WBC/hpf   Bacteria, UA MANY (A) NONE SEEN   Squamous Epithelial / LPF 0-5 0 - 5    Comment: Performed at [facility], [place] [address], [place], [place] [address]  Sodium, urine, random     Status: None   Collection Time: 10/21/22 10:58 AM  Result Value Ref Range   Sodium, Ur 27 mmol/L    Comment: Performed at [facility] [address], [place], [place] [address]  Creatinine, urine, random     Status: None   Collection Time: 10/21/22 10:58 AM  Result Value Ref Range   Creatinine, Urine 187 mg/dL    Comment: Performed at [facility] [address], [place],  [address]   ECHOCARDIOGRAM COMPLETE  Result Date: 10/21/2022    ECHOCARDIOGRAM REPORT   Patient Name:   Daniel [name] Date of Exam: 10/21/2022 Medical Rec #:  [id number]      Height:  69.0 in Accession #:    [id number]     Weight:       190.0 lb Date of Birth:  [birth year]     BSA:          2.021 m Patient Age:    67 years       BP:           126/71 mmHg Patient Gender: M              HR:           63 bpm. Exam Location:  Inpatient Procedure: Color Doppler and Cardiac Doppler Indications:    stroke  History:        Patient has no prior history of Echocardiogram examinations.                 Risk Factors:Hypertension and Dyslipidemia.  Sonographer:    Melissa Morford RDCS (AE, PE) Referring Phys:  [id number] VASUNDHRA [place]  1. Left ventricular ejection fraction, by estimation, is 60 to 65%. The left ventricle has normal function. The left ventricle has no regional wall motion abnormalities. There is mild left ventricular hypertrophy. Left ventricular diastolic parameters are consistent with Grade I diastolic dysfunction (impaired relaxation).  2. Right ventricular systolic function is normal. The right ventricular size is normal.  3. Left atrial size was mild to moderately dilated.  4. Right atrial size was mildly dilated.  5. The mitral valve is normal in structure. No evidence of mitral valve regurgitation. No evidence of mitral stenosis.  6. The aortic valve is tricuspid. Aortic valve regurgitation is not visualized. No aortic stenosis is present.  7. The inferior vena cava is normal in size with greater than 50% respiratory variability, suggesting right atrial pressure of 3 mmHg. Conclusion(s)/Recommendation(s): No intracardiac source of embolism detected on this transthoracic study. Consider a transesophageal echocardiogram to exclude cardiac source of embolism if clinically indicated. FINDINGS  Left Ventricle: Left ventricular ejection fraction, by estimation, is 60 to 65%. The left ventricle has normal function. The left ventricle has no regional wall motion abnormalities. The left ventricular internal cavity size was normal in size. There is  mild left ventricular hypertrophy. Left ventricular diastolic parameters are consistent with Grade I diastolic dysfunction (impaired relaxation). Right Ventricle: The right ventricular size is normal. No increase in right ventricular wall thickness. Right ventricular systolic function is normal. Left Atrium: Left atrial size was mild to moderately dilated. Right Atrium: Right atrial size was mildly dilated. Pericardium: There is no evidence of pericardial effusion. Mitral Valve: The mitral valve is normal in structure. No evidence of mitral valve  regurgitation. No evidence of mitral valve stenosis. Tricuspid Valve: The tricuspid valve is normal in structure. Tricuspid valve regurgitation is trivial. No evidence of tricuspid stenosis. Aortic Valve: The aortic valve is tricuspid. Aortic valve regurgitation is not visualized. No aortic stenosis is present. Pulmonic Valve: The pulmonic valve was normal in structure. Pulmonic valve regurgitation is not visualized. No evidence of pulmonic stenosis. Aorta: The aortic root is normal in size and structure. Venous: The inferior vena cava is normal in size with greater than 50% respiratory variability, suggesting right atrial pressure of 3 mmHg. IAS/Shunts: No atrial level shunt detected by color flow Doppler.  LEFT VENTRICLE PLAX 2D LVIDd:         4.90 cm      Diastology LVIDs:         3.50 cm      LV e' medial:  5.00 cm/s LV PW:         1.20 cm      LV E/e' medial:  16.0 LV IVS:        1.20 cm      LV e' lateral:   5.00 cm/s LVOT diam:     2.10 cm      LV E/e' lateral: 16.0 LV SV:         70 LV SV Index:   35 LVOT Area:     3.46 cm  LV Volumes (MOD) LV vol d, MOD A2C: 64.2 ml LV vol d, MOD A4C: 141.0 ml LV vol s, MOD A2C: 42.9 ml LV vol s, MOD A4C: 70.4 ml LV SV MOD A2C:     21.3 ml LV SV MOD A4C:     141.0 ml LV SV MOD BP:      45.5 ml RIGHT VENTRICLE RV S prime:     15.30 cm/s TAPSE (M-mode): 2.5 cm LEFT ATRIUM              Index        RIGHT ATRIUM           Index LA diam:        3.10 cm  1.53 cm/m   RA Area:     21.60 cm LA Vol (A2C):   52.3 ml  25.87 ml/m  RA Volume:   67.60 ml  33.44 ml/m LA Vol (A4C):   106.0 ml 52.44 ml/m LA Biplane Vol: 76.1 ml  37.65 ml/m  AORTIC VALVE LVOT Vmax:   95.10 cm/s LVOT Vmean:  70.500 cm/s LVOT VTI:    0.202 m  AORTA Ao Root diam: 3.40 cm MITRAL VALVE                TRICUSPID VALVE MV Area (PHT): 4.12 cm     TR Peak grad:   10.9 mmHg MV Decel Time: 184 msec     TR Vmax:        165.00 cm/s MV E velocity: 80.20 cm/s MV A velocity: 108.00 cm/s  SHUNTS MV E/A ratio:  0.74          Systemic VTI:  0.20 m                             Systemic Diam: 2.10 cm Candee Furbish MD Electronically signed by Candee Furbish MD Signature Date/Time: 10/21/2022/11:12:02 AM    Final    VAS US CAROTID (at [facility] and [facility] only)  Result Date: 10/21/2022 Carotid Arterial Duplex Study Patient Name:  Daniel [name]  Date of Exam:   10/21/2022 Medical Rec #: [id number]       Accession #:    [id number] Date of Birth: 03/05/1946      Patient Gender: M Patient Age:   76 years Exam Location:  [facility] Procedure:      VAS US CAROTID Referring Phys: Wandra Feinstein RATHORE --------------------------------------------------------------------------------  Indications:       CVA. Risk Factors:      Hypertension, hyperlipidemia, past history of smoking, prior                    CVA, PAD. Comparison Study:  Left ICA occlusion noted on MRA head 10-20-2022 Performing Technologist: Darlin Coco RDMS, RVT  Examination Guidelines: A complete evaluation includes B-mode imaging, spectral Doppler, color Doppler, and power Doppler as needed of all accessible portions of each vessel. Bilateral  testing is considered an integral part of a complete examination. Limited examinations for reoccurring indications may be performed as noted.  Right Carotid Findings: +----------+--------+--------+--------+-------------------------+--------+           PSV cm/sEDV cm/sStenosisPlaque Description       Comments +----------+--------+--------+--------+-------------------------+--------+ CCA Prox  100     15                                                +----------+--------+--------+--------+-------------------------+--------+ CCA Distal81      23                                                +----------+--------+--------+--------+-------------------------+--------+ ICA Prox  193     40      40-59%  calcific and heterogenous         +----------+--------+--------+--------+-------------------------+--------+ ICA Mid   86       26                                                +----------+--------+--------+--------+-------------------------+--------+ ICA Distal51      22                                                +----------+--------+--------+--------+-------------------------+--------+ ECA       272     28      >50%                                      +----------+--------+--------+--------+-------------------------+--------+ +----------+--------+-------+---------+-------------------+           PSV cm/sEDV cmsDescribe Arm Pressure (mmHG) +----------+--------+-------+---------+-------------------+ [id number]            Turbulent                    +----------+--------+-------+---------+-------------------+ +---------+--------+---+--------+--+---------+ VertebralPSV cm/s100EDV cm/s16Antegrade +---------+--------+---+--------+--+---------+  Left Carotid Findings: +----------+--------+--------+--------+---------------------------+--------+           PSV cm/sEDV cm/sStenosisPlaque Description         Comments +----------+--------+--------+--------+---------------------------+--------+ CCA Prox  125     18                                                  +----------+--------+--------+--------+---------------------------+--------+ CCA Distal75      15                                                  +----------+--------+--------+--------+---------------------------+--------+ ICA Prox                  Occludedhypoechoic and heterogenous         +----------+--------+--------+--------+---------------------------+--------+ ICA Mid  Occludedhypoechoic and heterogenous         +----------+--------+--------+--------+---------------------------+--------+ ICA Distal                Occludedhypoechoic and heterogenous         +----------+--------+--------+--------+---------------------------+--------+ ECA       318     47      >50%                                         +----------+--------+--------+--------+---------------------------+--------+ +----------+--------+--------+----------------+-------------------+           PSV cm/sEDV cm/sDescribe        Arm Pressure (mmHG) +----------+--------+--------+----------------+-------------------+ [id number]             Multiphasic, WNL                    +----------+--------+--------+----------------+-------------------+ +---------+--------+---+--------+--+---------+ VertebralPSV cm/s144EDV cm/s36Antegrade +---------+--------+---+--------+--+---------+   Summary: Right Carotid: Velocities in the right ICA are consistent with a 40-59%                stenosis. The ECA appears >50% stenosed. Left Carotid: Evidence consistent with a total occlusion of the left ICA. The               ECA appears >50% stenosed. Vertebrals:  Bilateral vertebral arteries demonstrate antegrade flow. Subclavians: Right subclavian artery flow was disturbed. Normal flow              hemodynamics were seen in the left subclavian artery. *See table(s) above for measurements and observations.     Preliminary    US RENAL  Result Date: 10/21/2022 CLINICAL DATA:  [id number] with acute kidney injury. EXAM: RENAL / URINARY TRACT ULTRASOUND COMPLETE COMPARISON:  Renal ultrasound 12/11/2019 FINDINGS: Right Kidney: Renal measurements: 9.1 x 4.3 x 3.7 cm = volume: 74.5 mL, previously 89 mL. Echogenicity in general mildly increased as before. No mass, stones or hydronephrosis visualized. Left Kidney: Renal measurements: 10.2 x 4.4 x 4.1 = volume: 98.3 mL, previously 125 mL. Echogenicity in general mildly increased as before. There is a 3 cm simple cyst in the inferior pole, stable. No mass or hydronephrosis visualized. Bladder: Appears normal for degree of bladder distention. Other: None. IMPRESSION: 1. No acute findings. No hydronephrosis. 2. Mildly increased echogenicity of both kidneys as before, compatible with medical renal  disease. 3. Stable 3 cm simple cyst in the inferior pole of the left kidney. 4. By measurements there appears to be a minimal to mild interval volume loss in both kidneys Electronically Signed   By: Telford Nab M.D.   On: 10/21/2022 00:51   MR ANGIO HEAD WO CONTRAST  Result Date: 10/20/2022 CLINICAL DATA:  Stroke or TIA EXAM: MRA HEAD WITHOUT CONTRAST TECHNIQUE: Angiographic images of the Circle of Willis were acquired using MRA technique without intravenous contrast. COMPARISON:  None Available. FINDINGS: POSTERIOR CIRCULATION: --Vertebral arteries: Normal --Inferior cerebellar arteries: Normal. --Basilar artery: Normal. --Superior cerebellar arteries: Normal. --Posterior cerebral arteries: Normal. ANTERIOR CIRCULATION: --Intracranial internal carotid arteries: Loss of normal flow related enhancement within the left internal carotid artery. Diminished flow related enhancement of the distal intracranial ICA. --Anterior cerebral arteries (ACA): Normal. --Middle cerebral arteries (MCA): Poor flow related enhancement at the left MCA. Right MCA is normal. ANATOMIC VARIANTS: None IMPRESSION: 1. Occlusion of the left ICA proximal to the skull base. 2. Diminished flow related enhancement in the left MCA. CTA of the head and  neck may provide a more complete assessment. Electronically Signed   By: Ulyses Jarred M.D.   On: 10/20/2022 23:32   MR BRAIN WO CONTRAST  Result Date: 10/20/2022 CLINICAL DATA:  Acute neurologic deficit EXAM: MRI HEAD WITHOUT CONTRAST TECHNIQUE: Multiplanar, multiecho pulse sequences of the brain and surrounding structures were obtained without intravenous contrast. COMPARISON:  07/31/2009 FINDINGS: Brain: Large area of acute ischemia within the anterior left MCA territory. There are bilateral subdural hematomas right greater than left. There is confluent hyperintense T2-weighted signal within the white matter. Generalized volume loss. Encephalomalacia at the site of old left anterior  cerebral artery infarct. The midline structures are normal. Vascular: Major flow voids are preserved. Skull and upper cervical spine: Normal calvarium and skull base. Visualized upper cervical spine and soft tissues are normal. Sinuses/Orbits:No paranasal sinus fluid levels or advanced mucosal thickening. No mastoid or middle ear effusion. Normal orbits. IMPRESSION: 1. Large area of acute ischemia within the anterior left MCA territory. No hemorrhage or mass effect. 2. Bilateral subdural hematomas right greater than left. Electronically Signed   By: Ulyses Jarred M.D.   On: 10/20/2022 20:18   CT Head Wo Contrast  Result Date: 10/20/2022 CLINICAL DATA:  Neuro deficit, acute, stroke suspected EXAM: CT HEAD WITHOUT CONTRAST TECHNIQUE: Contiguous axial images were obtained from the base of the skull through the vertex without intravenous contrast. RADIATION DOSE REDUCTION: This exam was performed according to the departmental dose-optimization program which includes automated exposure control, adjustment of the mA and/or kV according to patient size and/or use of iterative reconstruction technique. COMPARISON:  None Available. BRAIN: BRAIN Cerebral ventricle sizes are concordant with the degree of cerebral volume loss. Patchy and confluent areas of decreased attenuation are noted throughout the deep and periventricular white matter of the cerebral hemispheres bilaterally, compatible with chronic microvascular ischemic disease. Chronic left frontoparietal and temporal encephalomalacia. No evidence of large-territorial acute infarction. No parenchymal hemorrhage. No mass lesion. Acute density 72mm right subdural hematoma extending along the falx cerebral and tentorium. Query separate 24mm left parafalcine hematoma. No mass effect or midline shift. No hydrocephalus. Basilar cisterns are patent. Vascular: No hyperdense vessel. Atherosclerotic calcifications are present within the cavernous internal carotid and vertebral  arteries. Skull: No acute fracture or focal lesion. Sinuses/Orbits: Paranasal sinuses and mastoid air cells are clear. The orbits are unremarkable. Other: None. IMPRESSION: 1. Acute 6 mm right subdural hematoma extending along the falx cerebri and tentorium. No definite associated mass effect. 2. Query separate 50mm left parafalcine hematoma. These results were called by telephone at the time of interpretation on 10/20/2022 at 5:04 pm to provider [facility] , who verbally acknowledged these results. Electronically Signed   By: Iven Finn M.D.   On: 10/20/2022 17:05    Pending Labs Unresulted Labs (From admission, onward)     Start     Ordered   10/22/22 0500  CBC  Tomorrow morning,   R        10/21/22 1344   10/22/22 0254  Basic metabolic panel  Tomorrow morning,   R        10/21/22 1344   10/21/22 1208  Magnesium  Once,   R        10/21/22 1207   10/21/22 0500  Parathyroid hormone, intact (no Ca)  Tomorrow morning,   R        10/20/22 2322   10/20/22 1418  Rapid urine drug screen (hospital performed)  ONCE - STAT,   STAT  10/20/22 1418   10/20/22 1418  Urinalysis, Routine w reflex microscopic  Once,   URGENT        10/20/22 1418            Vitals/Pain Today's Vitals   10/21/22 1437 10/21/22 1500 10/21/22 1505 10/21/22 1510  BP:  135/69    Pulse:  (!) 59 63 66  Resp:  13 18 12   Temp: 98.1 F (36.7 C)     TempSrc: Oral     SpO2:  100% 100% 100%  Weight:      Height:      PainSc:        Isolation Precautions No active isolations  Medications Medications   stroke: early stages of recovery book (has no administration in time range)  acetaminophen (TYLENOL) tablet 650 mg (has no administration in time range)    Or  acetaminophen (TYLENOL) suppository 650 mg (has no administration in time range)  sodium bicarbonate tablet 650 mg (650 mg Oral Given 10/21/22 1024)  atorvastatin (LIPITOR) tablet 40 mg (40 mg Oral Given 50/35/46 5681)  folic acid (FOLVITE) tablet  1 mg (1 mg Oral Given 10/21/22 1024)  tamsulosin (FLOMAX) capsule 0.4 mg (0.4 mg Oral Given 10/21/22 1024)  sodium bicarbonate 75 mEq in sodium chloride 0.45 % 1,075 mL infusion ( Intravenous New Bag/Given 10/21/22 1159)  nystatin (MYCOSTATIN) 100000 UNIT/ML suspension 500,000 Units (500,000 Units Oral Given 10/21/22 1435)  Vitamin D (Ergocalciferol) (DRISDOL) 1.25 MG (50000 UNIT) capsule 50,000 Units (has no administration in time range)  calcium carbonate (OS-CAL - dosed in mg of elemental calcium) tablet 1,250 mg (has no administration in time range)  hydrALAZINE (APRESOLINE) injection 5 mg (has no administration in time range)  potassium chloride 10 mEq in 100 mL IVPB (0 mEq Intravenous Stopped 10/20/22 1900)  potassium chloride 10 mEq in 100 mL IVPB (0 mEq Intravenous Stopped 10/21/22 0526)  calcium gluconate 2 g/ 100 mL sodium chloride IVPB (0 mg Intravenous Stopped 10/21/22 0046)  sodium chloride 0.9 % bolus 1,000 mL (1,000 mLs Intravenous New Bag/Given 10/21/22 1436)    Mobility non-ambulatory High fall risk   Focused Assessments Cardiac Assessment Handoff:    Lab Results  Component Value Date   CKTOTAL 176 07/31/2009   CKMB 3.9 07/31/2009   TROPONINI 0.03        NO INDICATION OF MYOCARDIAL INJURY. 07/31/2009   No results found for: "DDIMER" Does the Patient currently have chest pain? No   , Neuro Assessment Handoff:  Swallow screen pass? Yes    NIH Stroke Scale ( + Modified Stroke Scale Criteria)  Interval: Initial Level of Consciousness (1a.)   : Alert, keenly responsive LOC Questions (1b. )   +: Answers both questions correctly LOC Commands (1c. )   + : Performs both tasks correctly Best Gaze (2. )  +: Normal Visual (3. )  +: No visual loss Facial Palsy (4. )    : Partial paralysis  (R sided facial droop) Motor Arm, Left (5a. )   +: Drift Motor Arm, Right (5b. )   +: No effort against gravity Motor Leg, Left (6a. )   +: Some effort against gravity Motor Leg,  Right (6b. )   +: Some effort against gravity Limb Ataxia (7. ): Absent Sensory (8. )   +: Normal, no sensory loss Best Language (9. )   +: Mild-to-moderate aphasia Dysarthria (10. ): Mild-to-moderate dysarthria, patient slurs at least some words and, at worst, can be understood with some difficulty  Extinction/Inattention (11.)   +: No Abnormality Modified SS Total  +: 9 Complete NIHSS TOTAL: 8 Last date known well: 10/18/22   Neuro Assessment: Within Defined Limits Neuro Checks:   Initial (10/20/22 2227)  Last Documented NIHSS Modified Score: 9 (10/21/22 1430) Has TPA been given? No If patient is a Neuro Trauma and patient is going to OR before floor call report to [place] nurse: [phone] or [phone]  , Pulmonary Assessment Handoff:  Lung sounds:   O2 Device: Room Air      R Recommendations: See Admitting Provider Note  Report given to:   Additional Notes:

## 2022-10-21 NOTE — Evaluation (Signed)
Physical Therapy Evaluation Patient Details Name: Daniel [name] MRN: [id number] DOB: November 20, 1946 Today's Date: 10/21/2022  History of Present Illness  76 y.o. male presents to [facility] hospital on 10/20/2022 with R facial droop and slurred speech. MRI demonstrates large L MCA CVA and bilateral SDH, R>L. PMH includes HTN, HLD, CVA w/ R hemiparesis, CKD IV, schizophrenia, BPH.  Clinical Impression  Pt presents to PT with deficits in balance, strength, power, endurance, coordination, tone. Pt with profound R weakness at this time, requiring significant physical assistance to mobilize at bed level and to stand. Pt with increased adductor tone in RLE along with knee flexor and elbow flexor tone. Pt reports being able to stand and pivot to a wheelchair at baseline, utilizing a manual wheelchair to mobilize around the home. Pt will benefit from aggressive mobilization in an effort to restore independence and reduce caregiver burden. PT recommends AIR admission.       Recommendations for follow up therapy are one component of a multi-disciplinary discharge planning process, led by the attending physician.  Recommendations may be updated based on patient status, additional functional criteria and insurance authorization.  Follow Up Recommendations Acute inpatient rehab (3hours/day)      Assistance Recommended at Discharge Intermittent Supervision/Assistance  Patient can return home with the following  Two people to help with walking and/or transfers;Two people to help with bathing/dressing/bathroom;Assistance with cooking/housework;Direct supervision/assist for medications management;Direct supervision/assist for financial management;Assist for transportation;Help with stairs or ramp for entrance    Equipment Recommendations BSC/3in1  Recommendations for Other Services  Rehab consult    Functional Status Assessment Patient has had a recent decline in their functional status and demonstrates the ability  to make significant improvements in function in a reasonable and predictable amount of time.     Precautions / Restrictions Precautions Precautions: Fall Restrictions Weight Bearing Restrictions: No      Mobility  Bed Mobility Overal bed mobility: Needs Assistance Bed Mobility: Supine to Sit, Sit to Supine     Supine to sit: Mod assist, HOB elevated Sit to supine: Mod assist, HOB elevated   General bed mobility comments: cues to assist RLE with LLE, hand hold to pull into sitting with use of LUE    Transfers Overall transfer level: Needs assistance Equipment used: 1 person hand held assist Transfers: Sit to/from Stand Sit to Stand: Max assist, From elevated surface           General transfer comment: R knee block, increased adductor tone noted in standing    Ambulation/Gait                  Stairs            Wheelchair Mobility    Modified Rankin (Stroke Patients Only) Modified Rankin (Stroke Patients Only) Pre-Morbid Rankin Score: Severe disability Modified Rankin: Severe disability     Balance Overall balance assessment: Needs assistance Sitting-balance support: No upper extremity supported, Feet supported Sitting balance-Leahy Scale: Fair     Standing balance support: Single extremity supported, Reliant on assistive device for balance Standing balance-Leahy Scale: Poor                               Pertinent Vitals/Pain Pain Assessment Pain Assessment: No/denies pain    Home Living Family/patient expects to be discharged to:: Private residence Living Arrangements: Other relatives (sister) Available Help at Discharge: Family;Available 24 hours/day Type of Home: House Home Access: Ramped entrance  Home Layout: One level Home Equipment: Conservation officer, nature (2 wheels);Wheelchair - manual      Prior Function Prior Level of Function : Needs assist             Mobility Comments: pt reports performing stand pivot  transfers to wheelchair independently, mobilizing in wheelchair around the home ADLs Comments: assistance for IADLs, reports bathing, dressing, independence with ADLs     Hand Dominance   Dominant Hand: Left    Extremity/Trunk Assessment   Upper Extremity Assessment Upper Extremity Assessment: RUE deficits/detail;LUE deficits/detail RUE Deficits / Details: trace activation at shoulder, elbow flexors, and finger flexors noted all <2/5, no activation of tricep extensors or wrist flex/ext noted. elbow wrist and digit ROM WFL passively, shoulder flexion limited to ~110 degrees PROM LUE Deficits / Details: grossly 4/5 other than 4-/5 grip strength, slowed fine motor    Lower Extremity Assessment Lower Extremity Assessment: LLE deficits/detail;RLE deficits/detail RLE Deficits / Details: R knee with ~15 degree knee flexion contracture, strength grossly 3+/5 through available range. Increased adductor tone and knee flexion tone LLE Deficits / Details: LLE grossly 4-/5, knee extension to ~-5 degrees passively.    Cervical / Trunk Assessment Cervical / Trunk Assessment: Kyphotic  Communication   Communication:  (dysarthric)  Cognition Arousal/Alertness: Awake/alert Behavior During Therapy: WFL for tasks assessed/performed Overall Cognitive Status: Impaired/Different from baseline Area of Impairment: Orientation, Safety/judgement, Awareness, Problem solving                 Orientation Level: Disoriented to, Situation       Safety/Judgement: Decreased awareness of deficits, Decreased awareness of safety Awareness: Emergent Problem Solving: Slow processing, Difficulty sequencing          General Comments General comments (skin integrity, edema, etc.): VSS on RA    Exercises     Assessment/Plan    PT Assessment Patient needs continued PT services  PT Problem List Decreased strength;Decreased range of motion;Decreased activity tolerance;Decreased balance;Decreased  mobility;Decreased cognition;Decreased knowledge of use of DME;Decreased safety awareness;Decreased knowledge of precautions;Impaired tone       PT Treatment Interventions DME instruction;Functional mobility training;Therapeutic activities;Therapeutic exercise;Balance training;Neuromuscular re-education;Patient/family education;Wheelchair mobility training;Cognitive remediation    PT Goals (Current goals can be found in the Care Plan section)  Acute Rehab PT Goals Patient Stated Goal: to return to mobilizing independently at a wheelchair level PT Goal Formulation: With patient Time For Goal Achievement: 11/04/22 Potential to Achieve Goals: Fair Additional Goals Additional Goal #1: Pt will mobilize in a manual wheelchair for 50' with minG in an effort to improve his ability to mobilize within the home    Frequency Min 4X/week     Co-evaluation               AM-PAC PT "6 Clicks" Mobility  Outcome Measure Help needed turning from your back to your side while in a flat bed without using bedrails?: A Lot Help needed moving from lying on your back to sitting on the side of a flat bed without using bedrails?: A Lot Help needed moving to and from a bed to a chair (including a wheelchair)?: Total Help needed standing up from a chair using your arms (e.g., wheelchair or bedside chair)?: A Lot Help needed to walk in hospital room?: Total Help needed climbing 3-5 steps with a railing? : Total 6 Click Score: 9    End of Session   Activity Tolerance: Patient tolerated treatment well Patient left: in bed;with call bell/phone within reach Nurse Communication: Mobility status;Need  for lift equipment PT Visit Diagnosis: Other abnormalities of gait and mobility (R26.89);Other symptoms and signs involving the nervous system (R29.898);Hemiplegia and hemiparesis Hemiplegia - Right/Left: Right Hemiplegia - dominant/non-dominant: Non-dominant Hemiplegia - caused by: Cerebral infarction     Time: 1129-1159 PT Time Calculation (min) (ACUTE ONLY): 30 min   Charges:   PT Evaluation $PT Eval Low Complexity: 1 Low          Zenaida Niece, PT, DPT Acute Rehabilitation Office [phone]   Zenaida Niece 10/21/2022, 12:38 PM

## 2022-10-21 NOTE — ED Notes (Signed)
Bladder scan shows 117cc in bladder

## 2022-10-21 NOTE — Progress Notes (Signed)
Carotid duplex bilateral study completed.   Please see CV Proc for preliminary results.   Biff Rutigliano, RDMS, RVT  

## 2022-10-21 NOTE — Progress Notes (Signed)
PROGRESS NOTE    Daniel [name]  EHM:[id number] DOB: 12/13/1945 DOA: 10/20/2022 PCP: Vivi Barrack, MD   Brief Narrative: 76 year old with past medical history significant for hypertension, hyperlipidemia, stroke with residual right hemiparesis, CKD stage IIIb-4, schizophrenia, BPH presented to ED via EMS with right-sided facial droop, slurred speech.LKW 3 days ago.  Potassium 2.9, bicarb 19, anion gap 13 creatinine 5.8 (baseline 2.0-2.3 in 2021).  MRI of brain showing large area of acute ischemia within the anterior left MCA territory.  Bilateral subdural hematoma, right greater than left.     Assessment & Plan:   Principal Problem:   Acute CVA (cerebrovascular accident) ([name]) Active Problems:   HTN (hypertension)   Hyperlipidemia   Schizophrenia ([name])   Acute kidney injury superimposed on chronic kidney disease (HCC)   Subdural hematoma (HCC)   Metabolic acidosis   Hypokalemia   Hypocalcemia   BPH (benign prostatic hyperplasia)  1-Acute Ischemic Stroke:  MRI brain: Large area of acute ischemia within the anterior left MCA territory.  No hemorrhage or mass effect.  Bilateral subdural hematoma right greater than left. MR Angio: Occlusion of the left ICA proximal to the skull base.  Diminished flow related enhancement in the left MCA.  ECHO: EF 65 %.  Grade 1 diastolic dysfunction. Carotid doppler:  LDL; 65, A1c: 5.0. Discussed with Dr. Erlinda Hong,  we could consider starting baby aspirin in 3 to 5 days due to subdural hematoma.   Bilateral subdural hematoma Neurosurgery Dr Marcello Moores consulted.  No neurosurgical intervention or follow-up required.  Anticoagulation will be relatively contraindicated in the setting. Per neurology plan to repeat CT head in 3 days.   AKI on CKD stage IIIb-IV:  Metabolic acidosis in setting renal failure.  -started oral Bicarb IV fluids with bicarb gtt.  Renal US, negative for hydronephrosis.  Nephrology consulted.   Hypokalemia: replaced.    Hypocalcemia:  Start supplement.   Vitamin D deficiency. Start supplement.   HTN;  Permissive HTN Hold lisinopril in setting AKI.   Hyperlipidemia:  Started lipitor.   Schizophrenia Not on meds.   BPH On flomax.     Pressure Injury 12/27/19 Buttocks Posterior Stage 2 -  Partial thickness loss of dermis presenting as a shallow open injury with a red, pink wound bed without slough.  (Active)  12/27/19 2203  Location: Buttocks  Location Orientation: Posterior  Staging: Stage 2 -  Partial thickness loss of dermis presenting as a shallow open injury with a red, pink wound bed without slough.  Wound Description (Comments):   Present on Admission: Yes    Estimated body mass index is 28.06 kg/m as calculated from the following:   Height as of this encounter: 5\' 9"  (1.753 m).   Weight as of this encounter: 86.2 kg.   DVT prophylaxis: scd Code Status: Full code Family Communication: care discussed with patient Disposition Plan:  Status is: Inpatient Remains inpatient appropriate because: inpatient for stroke, subdural. AKI    Consultants:  Neurology Neurosurgery Nephrology   Procedures:  ECHO  Antimicrobials:    Subjective: He is alert, dysarthric speech.  Right side weakness.   Objective: Vitals:   10/21/22 0430 10/21/22 0500 10/21/22 0530 10/21/22 0600  BP: 126/72 (!) 146/106 110/66 126/71  Pulse: (!) 58 (!) 58 (!) 52 (!) 56  Resp: 12 14 11 11   Temp:      TempSrc:      SpO2: 99% 99% 99% 99%  Weight:      Height:  Intake/Output Summary (Last 24 hours) at 10/21/2022 0739 Last data filed at 10/21/2022 0304 Gross per 24 hour  Intake 402.09 ml  Output --  Net 402.09 ml   Filed Weights   10/20/22 1347  Weight: 86.2 kg    Examination:  General exam: Appears calm and comfortable  Respiratory system: Clear to auscultation. Respiratory effort normal. Cardiovascular system: S1 & S2 heard, RRR. No JVD, murmurs, rubs, gallops or clicks. No  pedal edema. Gastrointestinal system: Abdomen is nondistended, soft and nontender. No organomegaly or masses felt. Normal bowel sounds heard. Central nervous system: Alert and oriented. No focal neurological deficits. Extremities: right side weakness, right arm 1/5  dysarthric speech. LE 4/5    Data Reviewed: I have personally reviewed following labs and imaging studies  CBC: Recent Labs  Lab 10/20/22 1453 10/20/22 1501 10/21/22 0435  WBC 6.1  --  7.0  NEUTROABS 4.8  --   --   HGB 11.8* 12.6* 10.0*  HCT 36.8* 37.0* 29.4*  MCV 90.0  --  89.6  PLT 249  --  035   Basic Metabolic Panel: Recent Labs  Lab 10/20/22 1453 10/20/22 1501 10/21/22 0435  NA 137 140 139  K 2.9* 2.9* 3.9  CL 105 106 111  CO2 19*  --  12*  GLUCOSE 108* 107* 93  BUN 41* 39* 43*  CREATININE 5.85* 6.40* 5.63*  CALCIUM 7.1*  --  7.1*   GFR: Estimated Creatinine Clearance: 12.1 mL/min (A) (by C-G formula based on SCr of 5.63 mg/dL (H)). Liver Function Tests: Recent Labs  Lab 10/20/22 1453  AST 26  ALT 19  ALKPHOS 81  BILITOT 0.5  PROT 7.2  ALBUMIN 3.7   No results for input(s): "LIPASE", "AMYLASE" in the last 168 hours. No results for input(s): "AMMONIA" in the last 168 hours. Coagulation Profile: Recent Labs  Lab 10/20/22 1453  INR 1.1   Cardiac Enzymes: No results for input(s): "CKTOTAL", "CKMB", "CKMBINDEX", "TROPONINI" in the last 168 hours. BNP (last 3 results) No results for input(s): "PROBNP" in the last 8760 hours. HbA1C: No results for input(s): "HGBA1C" in the last 72 hours. CBG: No results for input(s): "GLUCAP" in the last 168 hours. Lipid Profile: Recent Labs    10/21/22 0435  CHOL 132  HDL 46  LDLCALC 65  TRIG 107  CHOLHDL 2.9   Thyroid Function Tests: No results for input(s): "TSH", "T4TOTAL", "FREET4", "T3FREE", "THYROIDAB" in the last 72 hours. Anemia Panel: No results for input(s): "VITAMINB12", "FOLATE", "FERRITIN", "TIBC", "IRON", "RETICCTPCT" in the last  72 hours. Sepsis Labs: No results for input(s): "PROCALCITON", "LATICACIDVEN" in the last 168 hours.  No results found for this or any previous visit (from the past 240 hour(s)).       Radiology Studies: US RENAL  Result Date: 10/21/2022 CLINICAL DATA:  [id number] with acute kidney injury. EXAM: RENAL / URINARY TRACT ULTRASOUND COMPLETE COMPARISON:  Renal ultrasound 12/11/2019 FINDINGS: Right Kidney: Renal measurements: 9.1 x 4.3 x 3.7 cm = volume: 74.5 mL, previously 89 mL. Echogenicity in general mildly increased as before. No mass, stones or hydronephrosis visualized. Left Kidney: Renal measurements: 10.2 x 4.4 x 4.1 = volume: 98.3 mL, previously 125 mL. Echogenicity in general mildly increased as before. There is a 3 cm simple cyst in the inferior pole, stable. No mass or hydronephrosis visualized. Bladder: Appears normal for degree of bladder distention. Other: None. IMPRESSION: 1. No acute findings. No hydronephrosis. 2. Mildly increased echogenicity of both kidneys as before, compatible with medical renal  disease. 3. Stable 3 cm simple cyst in the inferior pole of the left kidney. 4. By measurements there appears to be a minimal to mild interval volume loss in both kidneys Electronically Signed   By: Telford Nab M.D.   On: 10/21/2022 00:51   MR ANGIO HEAD WO CONTRAST  Result Date: 10/20/2022 CLINICAL DATA:  Stroke or TIA EXAM: MRA HEAD WITHOUT CONTRAST TECHNIQUE: Angiographic images of the Circle of Willis were acquired using MRA technique without intravenous contrast. COMPARISON:  None Available. FINDINGS: POSTERIOR CIRCULATION: --Vertebral arteries: Normal --Inferior cerebellar arteries: Normal. --Basilar artery: Normal. --Superior cerebellar arteries: Normal. --Posterior cerebral arteries: Normal. ANTERIOR CIRCULATION: --Intracranial internal carotid arteries: Loss of normal flow related enhancement within the left internal carotid artery. Diminished flow related enhancement of the distal  intracranial ICA. --Anterior cerebral arteries (ACA): Normal. --Middle cerebral arteries (MCA): Poor flow related enhancement at the left MCA. Right MCA is normal. ANATOMIC VARIANTS: None IMPRESSION: 1. Occlusion of the left ICA proximal to the skull base. 2. Diminished flow related enhancement in the left MCA. CTA of the head and neck may provide a more complete assessment. Electronically Signed   By: Ulyses Jarred M.D.   On: 10/20/2022 23:32   MR BRAIN WO CONTRAST  Result Date: 10/20/2022 CLINICAL DATA:  Acute neurologic deficit EXAM: MRI HEAD WITHOUT CONTRAST TECHNIQUE: Multiplanar, multiecho pulse sequences of the brain and surrounding structures were obtained without intravenous contrast. COMPARISON:  07/31/2009 FINDINGS: Brain: Large area of acute ischemia within the anterior left MCA territory. There are bilateral subdural hematomas right greater than left. There is confluent hyperintense T2-weighted signal within the white matter. Generalized volume loss. Encephalomalacia at the site of old left anterior cerebral artery infarct. The midline structures are normal. Vascular: Major flow voids are preserved. Skull and upper cervical spine: Normal calvarium and skull base. Visualized upper cervical spine and soft tissues are normal. Sinuses/Orbits:No paranasal sinus fluid levels or advanced mucosal thickening. No mastoid or middle ear effusion. Normal orbits. IMPRESSION: 1. Large area of acute ischemia within the anterior left MCA territory. No hemorrhage or mass effect. 2. Bilateral subdural hematomas right greater than left. Electronically Signed   By: Ulyses Jarred M.D.   On: 10/20/2022 20:18   CT Head Wo Contrast  Result Date: 10/20/2022 CLINICAL DATA:  Neuro deficit, acute, stroke suspected EXAM: CT HEAD WITHOUT CONTRAST TECHNIQUE: Contiguous axial images were obtained from the base of the skull through the vertex without intravenous contrast. RADIATION DOSE REDUCTION: This exam was performed  according to the departmental dose-optimization program which includes automated exposure control, adjustment of the mA and/or kV according to patient size and/or use of iterative reconstruction technique. COMPARISON:  None Available. BRAIN: BRAIN Cerebral ventricle sizes are concordant with the degree of cerebral volume loss. Patchy and confluent areas of decreased attenuation are noted throughout the deep and periventricular white matter of the cerebral hemispheres bilaterally, compatible with chronic microvascular ischemic disease. Chronic left frontoparietal and temporal encephalomalacia. No evidence of large-territorial acute infarction. No parenchymal hemorrhage. No mass lesion. Acute density 24mm right subdural hematoma extending along the falx cerebral and tentorium. Query separate 66mm left parafalcine hematoma. No mass effect or midline shift. No hydrocephalus. Basilar cisterns are patent. Vascular: No hyperdense vessel. Atherosclerotic calcifications are present within the cavernous internal carotid and vertebral arteries. Skull: No acute fracture or focal lesion. Sinuses/Orbits: Paranasal sinuses and mastoid air cells are clear. The orbits are unremarkable. Other: None. IMPRESSION: 1. Acute 6 mm right subdural hematoma extending along the  falx cerebri and tentorium. No definite associated mass effect. 2. Query separate 3mm left parafalcine hematoma. These results were called by telephone at the time of interpretation on 10/20/2022 at 5:04 pm to provider [facility] , who verbally acknowledged these results. Electronically Signed   By: Iven Finn M.D.   On: 10/20/2022 17:05        Scheduled Meds:   stroke: early stages of recovery book   Does not apply Once   Continuous Infusions:  sodium chloride 125 mL/hr at 10/20/22 2345     LOS: 1 day    Time spent: 35 minutes    Baley Shands A Ishi Danser, MD [facility]   If 7PM-7AM, please contact  night-coverage [email or web address]  10/21/2022, 7:39 AM

## 2022-10-21 NOTE — ED Notes (Signed)
Report given to [facility]. Will transport patient upstairs at this time.

## 2022-10-21 NOTE — Evaluation (Signed)
Speech Language Pathology Evaluation Patient Details Name: Daniel [name] MRN: [id number] DOB: [birth year] Today's Date: 10/21/2022 Time: 1110-1130 SLP Time Calculation (min) (ACUTE ONLY): 20 min  Problem List:  Patient Active Problem List   Diagnosis Date Noted   Acute CVA (cerebrovascular accident) ([place]) 10/20/2022   Subdural hematoma (HCC) 33/00/7622   Metabolic acidosis 63/33/5456   Hypokalemia 10/20/2022   Hypocalcemia 10/20/2022   BPH (benign prostatic hyperplasia) 10/20/2022   Xerosis cutis 04/30/2020   Closed displaced intertrochanteric fracture of right femur ([place]) 12/11/2019   LFT elevation    Acute kidney injury superimposed on chronic kidney disease ([place])    Nicotine dependence with current use 03/29/2018   Poor dentition 01/17/2015   Peripheral vascular disease ([place]) 01/18/2014   Benign hypertension with CKD (chronic kidney disease) stage III ([place]) 01/18/2014   Hyperlipidemia 08/17/2011   Hemiplegia, late effect of cerebrovascular disease ([place]) 10/17/2009   Anemia 10/16/2008   HTN (hypertension) 10/16/2008   Substance abuse in remission ([place]) 10/16/2008   Schizophrenia ([place]) 12/07/1972   Past Medical History:  Past Medical History:  Diagnosis Date   Hypertension    Psychosis ([place])    Schizophrenia ([place]) 1974   Stroke ([place]) 2010   Right Hemiparesis   Past Surgical History:  Past Surgical History:  Procedure Laterality Date   FEMUR IM NAIL Right 12/12/2019   Procedure: INTRAMEDULLARY (IM) NAIL FEMORAL;  Surgeon: Renette Butters, MD;  Location: [place];  Service: Orthopedics;  Laterality: Right;   HPI:  Pt is a 76 y.o. male who presented to the ED via EMS with right-sided facial droop and slurred speech. Brain MRI (10/20/22) showing a large area of acute ischemia within the anterior left MCA territory. Also showing bilateral subdural hematomas, right greater than left. PMH:  hypertension, hyperlipidemia, stroke with residual right hemiparesis, CKD stage IIIb-IV,  schizophrenia, BPH.   Assessment / Plan / Recommendation Clinical Impression  Pt presents with mod dysarthria and cognitive communication deficits post L CVA. Oral mechanism examination significant for R facial asymmetry, R lingual deviation and reduced facial/lingual ROM. Speech in simple conversation was 25-50% intelligible and c/b articulatory imprecision and low vocal intensity. Slow rate, increase of vocal intensity and overarticulation improved intelligibility to 90% at the short phrase level. He is oriented to person and place, unable to recall full date and reason for hospitalization. Immediate recall of listed items was 100%. Delayed recall of functional information and listed items was 0%, however improved with choice/semantic cues to 100%. He exhibited mild word finding difficulties during responsive and confrontational naming task, requiring semantic cues. 2 step/complex commands and answering of complex y/n questions were also poor. Currently, suspecting more cognitive/attention based than true language impairment. Will continue differential dx along with tx of dysarthria. SLP to f/u.    SLP Assessment  SLP Recommendation/Assessment: Patient needs continued Speech Lanaguage Pathology Services SLP Visit Diagnosis: Dysarthria and anarthria (R47.1);Cognitive communication deficit (R41.841)    Recommendations for follow up therapy are one component of a multi-disciplinary discharge planning process, led by the attending physician.  Recommendations may be updated based on patient status, additional functional criteria and insurance authorization.    Follow Up Recommendations  Follow physician's recommendations for discharge plan and follow up therapies    Assistance Recommended at Discharge  Frequent or constant Supervision/Assistance  Functional Status Assessment Patient has had a recent decline in their functional status and demonstrates the ability to make significant improvements in  function in a reasonable and predictable amount of time.  Frequency and Duration min 2x/week  2 weeks      SLP Evaluation Cognition  Overall Cognitive Status: Impaired/Different from baseline Arousal/Alertness: Awake/alert Orientation Level: Oriented to person;Oriented to place;Disoriented to time;Disoriented to situation Year:  (2040) Month: November Day of Week: Incorrect Attention: Sustained Sustained Attention: Impaired Sustained Attention Impairment: Verbal basic Memory: Impaired Memory Impairment: Storage deficit;Decreased recall of new information;Retrieval deficit Awareness: Impaired Awareness Impairment: Intellectual impairment;Emergent impairment       Comprehension  Auditory Comprehension Overall Auditory Comprehension: Impaired Yes/No Questions: Impaired Basic Biographical Questions: 76-100% accurate Basic Immediate Environment Questions: 75-100% accurate Complex Questions: 25-49% accurate Commands: Impaired One Step Basic Commands: 75-100% accurate Two Step Basic Commands: 50-74% accurate Complex Commands: 25-49% accurate Interfering Components: Attention EffectiveTechniques: Repetition;Pausing Visual Recognition/Discrimination Discrimination: Not tested Reading Comprehension Reading Status: Not tested    Expression Expression Primary Mode of Expression: Verbal Verbal Expression Overall Verbal Expression: Impaired Initiation: No impairment Automatic Speech: Name;Social Response Level of Generative/Spontaneous Verbalization: Conversation Repetition: No impairment Naming: Impairment Responsive: 76-100% accurate Confrontation: Impaired (90%) Verbal Errors: Semantic paraphasias Interfering Components: Attention;Speech intelligibility Effective Techniques: Semantic cues Written Expression Written Expression: Not tested   Oral / Motor  Oral Motor/Sensory Function Overall Oral Motor/Sensory Function: Moderate impairment Facial ROM: Reduced  right;Suspected CN VII (facial) dysfunction Facial Symmetry: Abnormal symmetry right;Suspected CN VII (facial) dysfunction Lingual ROM: Reduced right;Suspected CN XII (hypoglossal) dysfunction Lingual Symmetry: Abnormal symmetry right;Suspected CN XII (hypoglossal) dysfunction Mandible: Within Functional Limits Motor Speech Overall Motor Speech: Impaired Respiration: Within functional limits Phonation: Normal Resonance: Within functional limits Articulation: Impaired Level of Impairment: Word Intelligibility: Intelligibility reduced Word: 50-74% accurate Phrase: 50-74% accurate Sentence: 25-49% accurate Conversation: 25-49% accurate Effective Techniques: Slow rate;Increased vocal intensity;Over-articulate             Ellwood Dense, [place], [place] Office Number: [phone]  Acie Fredrickson 10/21/2022, 11:58 AM

## 2022-10-21 NOTE — ED Notes (Addendum)
Pt unable to provide urine sample at this time. Verbal order for in and out cath per Dr. Marlowe Sax. In and out cath was attempted but unsuccessful. MD [place] notified.

## 2022-10-21 NOTE — ED Notes (Signed)
Patient removed nasal cannula himself and stated that he felt like he didn't need it at this time. Patient's spo2 remains above 90% and work of breathing is even and unlabored. No complaints of chest pain or shortness of breath. NIBP, cardiac monitor and pulse ox remain in place.

## 2022-10-21 NOTE — ED Notes (Signed)
PT at bedside to evaluate patient at this time.

## 2022-10-22 ENCOUNTER — Encounter (HOSPITAL_COMMUNITY): Payer: Self-pay | Admitting: Internal Medicine

## 2022-10-22 DIAGNOSIS — S065XAA Traumatic subdural hemorrhage with loss of consciousness status unknown, initial encounter: Secondary | ICD-10-CM | POA: Diagnosis not present

## 2022-10-22 DIAGNOSIS — I639 Cerebral infarction, unspecified: Secondary | ICD-10-CM | POA: Diagnosis not present

## 2022-10-22 LAB — CBC
HCT: 25 % — ABNORMAL LOW (ref 39.0–52.0)
Hemoglobin: 8.6 g/dL — ABNORMAL LOW (ref 13.0–17.0)
MCH: 29.9 pg (ref 26.0–34.0)
MCHC: 34.4 g/dL (ref 30.0–36.0)
MCV: 86.8 fL (ref 80.0–100.0)
Platelets: 210 10*3/uL (ref 150–400)
RBC: 2.88 MIL/uL — ABNORMAL LOW (ref 4.22–5.81)
RDW: 13.2 % (ref 11.5–15.5)
WBC: 7 10*3/uL (ref 4.0–10.5)
nRBC: 0 % (ref 0.0–0.2)

## 2022-10-22 LAB — BASIC METABOLIC PANEL
Anion gap: 8 (ref 5–15)
BUN: 40 mg/dL — ABNORMAL HIGH (ref 8–23)
CO2: 19 mmol/L — ABNORMAL LOW (ref 22–32)
Calcium: 6.4 mg/dL — CL (ref 8.9–10.3)
Chloride: 111 mmol/L (ref 98–111)
Creatinine, Ser: 5.16 mg/dL — ABNORMAL HIGH (ref 0.61–1.24)
GFR, Estimated: 11 mL/min — ABNORMAL LOW (ref >60)
Glucose, Bld: 104 mg/dL — ABNORMAL HIGH (ref 70–99)
Potassium: 3 mmol/L — ABNORMAL LOW (ref 3.5–5.1)
Sodium: 138 mmol/L (ref 135–145)

## 2022-10-22 LAB — ALBUMIN: Albumin: 2.7 g/dL — ABNORMAL LOW (ref 3.5–5.0)

## 2022-10-22 LAB — PARATHYROID HORMONE, INTACT (NO CA): PTH: 173 pg/mL — ABNORMAL HIGH (ref 15–65)

## 2022-10-22 MED ORDER — POTASSIUM CHLORIDE CRYS ER 20 MEQ PO TBCR: 40.0000 meq | EXTENDED_RELEASE_TABLET | Freq: Two times a day (BID) | ORAL | Status: DC

## 2022-10-22 MED ORDER — POTASSIUM CHLORIDE CRYS ER 20 MEQ PO TBCR
40.0000 meq | EXTENDED_RELEASE_TABLET | Freq: Three times a day (TID) | ORAL | Status: AC
  Administered 2022-10-22 (×3): 40 meq via ORAL
  Filled 2022-10-22 (×3): qty 2

## 2022-10-22 MED ORDER — CALCIUM GLUCONATE-NACL 1-0.675 GM/50ML-% IV SOLN
1.0000 g | Freq: Once | INTRAVENOUS | Status: AC
  Administered 2022-10-22: 1000 mg via INTRAVENOUS
  Filled 2022-10-22: qty 50

## 2022-10-22 MED ORDER — MAGNESIUM SULFATE 2 GM/50ML IV SOLN
2.0000 g | Freq: Once | INTRAVENOUS | Status: AC
  Administered 2022-10-22: 2 g via INTRAVENOUS
  Filled 2022-10-22: qty 50

## 2022-10-22 NOTE — Progress Notes (Signed)
  Transition of Care [facility]) Screening Note   Patient Details  Name: Daniel [name] Date of Birth: 1946-11-28   Transition of Care [facility]) CM/SW Contact:    Pollie Friar, RN Phone Number: 10/22/2022, 1:59 PM   Pt is from home with his sister. CIR recommended and following. Awaiting insurance for rehab admit. Transition of Care Department [facility]) has reviewed patient. We will continue to monitor patient advancement through interdisciplinary progression rounds. If new patient transition needs arise, please place a TOC consult.

## 2022-10-22 NOTE — Progress Notes (Signed)
Physical Therapy Treatment Patient Details Name: Daniel [name] MRN: [id number] DOB: 03-Jan-1946 Today's Date: 10/22/2022   History of Present Illness 76 y.o. male presents to [facility] hospital on 10/20/2022 with R facial droop and slurred speech. MRI demonstrates large L MCA CVA and bilateral SDH, R>L. PMH includes HTN, HLD, CVA w/ R hemiparesis, CKD IV, schizophrenia, BPH.    PT Comments    Pt with steady progress towards acute goals and agreeable to PT/OT session for initial transfer bed>chair. Pt able to perform squat pivot transfer to recliner with min assist +2. Pt with noted continued adductor tone of RLE with noted weakness R>L needing assist to complete all LE therex on R. Pt pleasant and participatory throughout session and agreeable to time up in chair at end of session. Current plan remains appropriate to address deficits and maximize functional independence and decrease caregiver burden. Pt continues to benefit from skilled PT services to progress toward functional mobility goals.     Recommendations for follow up therapy are one component of a multi-disciplinary discharge planning process, led by the attending physician.  Recommendations may be updated based on patient status, additional functional criteria and insurance authorization.  Follow Up Recommendations  Acute inpatient rehab (3hours/day)     Assistance Recommended at Discharge Intermittent Supervision/Assistance  Patient can return home with the following Two people to help with walking and/or transfers;Two people to help with bathing/dressing/bathroom;Assistance with cooking/housework;Direct supervision/assist for medications management;Direct supervision/assist for financial management;Assist for transportation;Help with stairs or ramp for entrance   Equipment Recommendations  BSC/3in1    Recommendations for Other Services       Precautions / Restrictions Precautions Precautions: Fall Restrictions Weight Bearing  Restrictions: No     Mobility  Bed Mobility Overal bed mobility: Needs Assistance Bed Mobility: Supine to Sit, Sit to Supine     Supine to sit: HOB elevated, Mod assist Sit to supine: Mod assist   General bed mobility comments: Mod A for balance upon rise and to scoot hips toward EOB to achieve feet on floor. Cues to assist RLE with LLE. Mod A to return to bed.    Transfers Overall transfer level: Needs assistance Equipment used: 2 person hand held assist Transfers: Bed to chair/wheelchair/BSC       Squat pivot transfers: Min assist, +2 physical assistance     General transfer comment: Min A for R knee block and power up    Ambulation/Gait                   Stairs             Wheelchair Mobility    Modified Rankin (Stroke Patients Only)       Balance Overall balance assessment: Needs assistance Sitting-balance support: No upper extremity supported, Feet supported Sitting balance-Leahy Scale: Fair     Standing balance support: Single extremity supported, Reliant on assistive device for balance Standing balance-Leahy Scale: Poor Standing balance comment: unable to come to fully upright position.                            Cognition Arousal/Alertness: Awake/alert Behavior During Therapy: WFL for tasks assessed/performed Overall Cognitive Status: Impaired/Different from baseline Area of Impairment: Safety/judgement, Awareness, Problem solving                         Safety/Judgement: Decreased awareness of deficits, Decreased awareness of safety Awareness: Emergent Problem Solving: Slow  processing, Difficulty sequencing General Comments: Pt with slow processing throughout. Able to report that sister helps with some tasks at home. Pt following all one step commands with increased time.        Exercises General Exercises - Lower Extremity Ankle Circles/Pumps: AROM, AAROM, Right, Left, 10 reps, Seated Long Arc Quad: AROM,  AAROM, Right, Left, 10 reps, Seated Hip ABduction/ADduction: AROM, AAROM, Right, Left, 10 reps, Seated Hip Flexion/Marching: AROM, AAROM, Right, Left, 10 reps, Seated    General Comments        Pertinent Vitals/Pain Pain Assessment Pain Assessment: No/denies pain Faces Pain Scale: No hurt    Home Living                          Prior Function            PT Goals (current goals can now be found in the care plan section) Acute Rehab PT Goals PT Goal Formulation: With patient Time For Goal Achievement: 11/04/22    Frequency    Min 4X/week      PT Plan      Co-evaluation PT/OT/SLP Co-Evaluation/Treatment: Yes Reason for Co-Treatment: Complexity of the patient's impairments (multi-system involvement);For patient/therapist safety PT goals addressed during session: Mobility/safety with mobility;Balance        AM-PAC PT "6 Clicks" Mobility   Outcome Measure  Help needed turning from your back to your side while in a flat bed without using bedrails?: A Lot Help needed moving from lying on your back to sitting on the side of a flat bed without using bedrails?: A Lot Help needed moving to and from a bed to a chair (including a wheelchair)?: Total Help needed standing up from a chair using your arms (e.g., wheelchair or bedside chair)?: A Lot Help needed to walk in hospital room?: Total Help needed climbing 3-5 steps with a railing? : Total 6 Click Score: 9    End of Session Equipment Utilized During Treatment: Gait belt Activity Tolerance: Patient tolerated treatment well Patient left: in chair;with call bell/phone within reach;with chair alarm set Nurse Communication: Mobility status PT Visit Diagnosis: Other abnormalities of gait and mobility (R26.89);Other symptoms and signs involving the nervous system (R29.898);Hemiplegia and hemiparesis Hemiplegia - Right/Left: Right Hemiplegia - dominant/non-dominant: Non-dominant Hemiplegia - caused by: Cerebral  infarction     Time: 2248-2500 PT Time Calculation (min) (ACUTE ONLY): 28 min  Charges:  $Therapeutic Activity: 8-22 mins                     Daniel [name] R. PTA Acute Rehabilitation Services Office: [place] 10/22/2022, 4:23 PM

## 2022-10-22 NOTE — Evaluation (Signed)
Occupational Therapy Evaluation Patient Details Name: Daniel [name] MRN: [id number] DOB: Oct 08, 1946 Today's Date: 10/22/2022   History of Present Illness 76 y.o. male presents to [facility] hospital on 10/20/2022 with R facial droop and slurred speech. MRI demonstrates large L MCA CVA and bilateral SDH, R>L. PMH includes HTN, HLD, CVA w/ R hemiparesis, CKD IV, schizophrenia, BPH.   Clinical Impression   PTA, pt lived with his sister who assisted with home management and cooking. Upon eval, pt performing squat pivot transfers with min A +2. Pt with decreased use or RUE as compared to baseline affecting ability to perform ADLs requiring BUE. Pt performing UB ADL and LB ADL with up to mod A. Pt was previously able to perform transfers to his power chair without assistance. Pt very motivated to participate in therapy, and with significant change in functional status. Thus, recommending discharge to AIR to optimize safety and independence in ADL and IADL.      Recommendations for follow up therapy are one component of a multi-disciplinary discharge planning process, led by the attending physician.  Recommendations may be updated based on patient status, additional functional criteria and insurance authorization.   Follow Up Recommendations  Acute inpatient rehab (3hours/day)     Assistance Recommended at Discharge Frequent or constant Supervision/Assistance  Patient can return home with the following A little help with walking and/or transfers;A little help with bathing/dressing/bathroom;Assistance with cooking/housework;Direct supervision/assist for medications management;Direct supervision/assist for financial management;Assist for transportation;Help with stairs or ramp for entrance;Assistance with feeding    Functional Status Assessment  Patient has had a recent decline in their functional status and demonstrates the ability to make significant improvements in function in a reasonable and  predictable amount of time.  Equipment Recommendations  Other (comment) (TBD next venue)    Recommendations for Other Services Rehab consult     Precautions / Restrictions Precautions Precautions: Fall Restrictions Weight Bearing Restrictions: No      Mobility Bed Mobility Overal bed mobility: Needs Assistance Bed Mobility: Supine to Sit, Sit to Supine     Supine to sit: HOB elevated, Mod assist Sit to supine: Mod assist   General bed mobility comments: Mod A for balance upon rise and to scoot hips toward EOB to achieve feet on floor. Cues to assist RLE with LLE. Mod A to return to bed.    Transfers Overall transfer level: Needs assistance Equipment used: 2 person hand held assist Transfers: Bed to chair/wheelchair/BSC     Squat pivot transfers: Min assist, +2 physical assistance       General transfer comment: Min A for R knee block and power up      Balance Overall balance assessment: Needs assistance Sitting-balance support: No upper extremity supported, Feet supported Sitting balance-Leahy Scale: Fair     Standing balance support: Single extremity supported, Reliant on assistive device for balance Standing balance-Leahy Scale: Poor Standing balance comment: unable to come to fully upright position.                           ADL either performed or assessed with clinical judgement   ADL Overall ADL's : Needs assistance/impaired Eating/Feeding: Sitting;Minimal assistance Eating/Feeding Details (indicate cue type and reason): unable to cut foods or open packages at this time. Grooming: Sitting Grooming Details (indicate cue type and reason): Mod A for BUE tasks Upper Body Bathing: Minimal assistance;Sitting   Lower Body Bathing: Moderate assistance;Sit to/from stand   Upper Body Dressing :  Moderate assistance;Sitting   Lower Body Dressing: Min guard;Sitting/lateral leans;Moderate assistance Lower Body Dressing Details (indicate cue type and  reason): Min guard A in recliner to don socks; would require mod A to stand and pull up pants Toilet Transfer: Minimal assistance;Squat-pivot;BSC/3in1;+2 for physical assistance Toilet Transfer Details (indicate cue type and reason): Min A for squat pivot Toileting- Clothing Manipulation and Hygiene: Min guard;Sitting/lateral lean               Vision Baseline Vision/History: 0 No visual deficits Ability to See in Adequate Light: 0 Adequate Patient Visual Report: No change from baseline Vision Assessment?: No apparent visual deficits Additional Comments: Pt denying changes in vision. WFL for tasks assessed.     Perception Perception Perception Tested?: No   Praxis Praxis Praxis tested?: Not tested    Pertinent Vitals/Pain Pain Assessment Pain Assessment: No/denies pain Faces Pain Scale: No hurt     Hand Dominance Left   Extremity/Trunk Assessment Upper Extremity Assessment Upper Extremity Assessment: RUE deficits/detail RUE Deficits / Details: trace activation at shoulder <2/5; unable to move through full range elbow extension but with cues using actively to attempt scooting backward in chair. Pt not actively movingb fingers or wrist at this time. Reports he could use it as a helper hand at baseline after prior CVA . elbow wrist and digit ROM WFL passively, shoulder flexion limited to ~110 degrees PROM LUE Deficits / Details: grossly 4/5 other than 4-/5 grip strength, slowed fine motor   Lower Extremity Assessment Lower Extremity Assessment: Defer to PT evaluation   Cervical / Trunk Assessment Cervical / Trunk Assessment: Kyphotic   Communication Communication Communication: Other (comment) (dysarthric)   Cognition Arousal/Alertness: Awake/alert Behavior During Therapy: WFL for tasks assessed/performed Overall Cognitive Status: Impaired/Different from baseline Area of Impairment: Safety/judgement, Awareness, Problem solving                          Safety/Judgement: Decreased awareness of deficits, Decreased awareness of safety Awareness: Emergent Problem Solving: Slow processing, Difficulty sequencing General Comments: Pt with slow processing throughout. Able to report that sister helps with some tasks at home. Pt following all one step commands with increased time.     General Comments  VSS    Exercises     Shoulder Instructions      Home Living Family/patient expects to be discharged to:: Private residence Living Arrangements: Other relatives (sister) Available Help at Discharge: Family;Available 24 hours/day Type of Home: House Home Access: Ramped entrance     Home Layout: One level     Bathroom Shower/Tub: Teacher, early years/pre: Standard Bathroom Accessibility: Yes How Accessible: Accessible via wheelchair;Accessible via walker Home Equipment: [place] (2 wheels);Wheelchair - manual      Lives With: Other (Comment) (sister)    Prior Functioning/Environment Prior Level of Function : Needs assist             Mobility Comments: pt reports performing stand pivot transfers to wheelchair independently, mobilizing in wheelchair around the home ADLs Comments: assistance for IADLs, reports bathing, dressing, independence with ADLs. Sister's friend occasionally helps with cooking/cleaning, etc        OT Problem List: Decreased strength;Decreased range of motion;Decreased activity tolerance;Impaired balance (sitting and/or standing);Decreased cognition;Decreased coordination;Decreased safety awareness;Impaired UE functional use      OT Treatment/Interventions: Self-care/ADL training;Therapeutic exercise;DME and/or AE instruction;Neuromuscular education;Therapeutic activities;Cognitive remediation/compensation;Balance training;Patient/family education    OT Goals(Current goals can be found in the care plan section) Acute  Rehab OT Goals Patient Stated Goal: get better OT Goal Formulation: With  patient Time For Goal Achievement: 11/05/22 Potential to Achieve Goals: Good  OT Frequency: Min 2X/week    Co-evaluation              AM-PAC OT "6 Clicks" Daily Activity     Outcome Measure Help from another person eating meals?: A Little Help from another person taking care of personal grooming?: A Lot Help from another person toileting, which includes using toliet, bedpan, or urinal?: A Little Help from another person bathing (including washing, rinsing, drying)?: A Little Help from another person to put on and taking off regular upper body clothing?: A Lot Help from another person to put on and taking off regular lower body clothing?: A Lot 6 Click Score: 15   End of Session Equipment Utilized During Treatment: Gait belt Nurse Communication: Mobility status  Activity Tolerance: Patient tolerated treatment well Patient left: in bed;with bed alarm set;with nursing/sitter in room (Initially left pt in chair, but returning with RN attempting to get pt in bed. Pt left in bed with RN in room.)  OT Visit Diagnosis: Unsteadiness on feet (R26.81);Other abnormalities of gait and mobility (R26.89);Muscle weakness (generalized) (M62.81);Other symptoms and signs involving cognitive function;Hemiplegia and hemiparesis Hemiplegia - Right/Left: Right Hemiplegia - dominant/non-dominant: Non-Dominant Hemiplegia - caused by: Cerebral infarction                Time: 1034-1100 OT Time Calculation (min): 26 min Charges:  OT General Charges $OT Visit: 1 Visit OT Evaluation $OT Eval Moderate Complexity: 1 Mod  Shanda Howells, OTR/L [facility] Acute Rehabilitation Office: [phone]   Magnus Ivan 10/22/2022, 1:13 PM

## 2022-10-22 NOTE — Progress Notes (Signed)
PROGRESS NOTE    THOR NANNINI  KGU:[id number] DOB: 03-Aug-1946 DOA: 10/20/2022 PCP: Vivi Barrack, MD   Brief Narrative: 76 year old with past medical history significant for hypertension, hyperlipidemia, stroke with residual right hemiparesis, CKD stage IIIb-4, schizophrenia, BPH presented to ED via EMS with right-sided facial droop, slurred speech.LKW 3 days ago.  Potassium 2.9, bicarb 19, anion gap 13 creatinine 5.8 (baseline 2.0-2.3 in 2021).  MRI of brain showing large area of acute ischemia within the anterior left MCA territory.  Bilateral subdural hematoma, right greater than left.     Assessment & Plan:   Principal Problem:   Acute CVA (cerebrovascular accident) ([place]) Active Problems:   HTN (hypertension)   Hyperlipidemia   Schizophrenia ([place])   Acute kidney injury superimposed on chronic kidney disease (HCC)   Subdural hematoma (H[facility])   Metabolic acidosis   Hypokalemia   Hypocalcemia   BPH (benign prostatic hyperplasia)  1-Acute Ischemic Stroke:  MRI brain: Large area of acute ischemia within the anterior left MCA territory.  No hemorrhage or mass effect.  Bilateral subdural hematoma right greater than left. MR Angio: Occlusion of the left ICA proximal to the skull base.  Diminished flow related enhancement in the left MCA.  ECHO: EF 65 %.  Grade 1 diastolic dysfunction. Carotid doppler: Right Carotid: Velocities in the right ICA are consistent with a 40-59% stenosis. The ECA appears >50% stenosed. Left Carotid: Evidence consistent with a total occlusion of the left ICA.  The ECA appears >50% stenosed. Needs to follow up with vascular out patient per neurology  LDL; 65, A1c: 5.0. Plan to repeat CT head in am , if Subdural stable plan to start baby aspirin.   Bilateral subdural hematoma Neurosurgery Dr Marcello Moores consulted.  No neurosurgical intervention or follow-up required.  Anticoagulation will be relatively contraindicated in the setting. Per neurology plan to  repeat CT head tomorrow.   AKI on CKD stage IIIb-IV:  Metabolic acidosis in setting renal failure.  In setting hemodynamic, hypovolemia.  -Started oral Bicarb IV fluids with bicarb gtt.  Renal US, negative for hydronephrosis.  Nephrology consulted. Appreciate assistance.  Cr at 5, bicarb improved.   Hypokalemia: Supplement ordered.   Hypocalcemia:  Started supplement.  IV calcium ordered this am.   Vitamin D deficiency. Started supplement.   HTN;  Permissive HTN Hold lisinopril in setting AKI.  BP goal 130-160 per neurology   Hyperlipidemia:  Started lipitor.   Schizophrenia Not on meds.   BPH On flomax.  Anemia:  Prior hb 11--8 Suspect hemodilution anemia of chronic diseases.  Check anemia panel.         Estimated body mass index is 21.52 kg/m as calculated from the following:   Height as of this encounter: 5\' 9"  (1.753 m).   Weight as of this encounter: 66.1 kg.   DVT prophylaxis: scd Code Status: Full code Family Communication: Care discussed with patient Disposition Plan:  Status is: Inpatient Remains inpatient appropriate because: inpatient for stroke, subdural. AKI. CIR>     Consultants:  Neurology Neurosurgery Nephrology   Procedures:  ECHO  Antimicrobials:    Subjective: His neuro deficit are stable. He report slurred speech some improvement.    Objective: Vitals:   10/21/22 2055 10/21/22 2337 10/22/22 0402 10/22/22 0823  BP:  (!) 159/81 (!) 164/74 (!) 162/72  Pulse:  76 74 73  Resp:  16 18 14   Temp:  99.2 F (37.3 C) 97.6 F (36.4 C) 99 F (37.2 C)  TempSrc:  Oral  Oral  SpO2:  98% 100% 100%  Weight: 66.1 kg     Height:        Intake/Output Summary (Last 24 hours) at 10/22/2022 0950 Last data filed at 10/21/2022 2337 Gross per 24 hour  Intake --  Output 1600 ml  Net -1600 ml    Filed Weights   10/20/22 1347 10/21/22 2055  Weight: 86.2 kg 66.1 kg    Examination:  General exam: NAD Respiratory system:  CTA Cardiovascular system:S 1, S 2 RRR Gastrointestinal system: BS present, soft, nt Central nervous system: Alert. Right side weakness, right arm 1/5  dysarthric speech. LE 4/5 Extremities: Right side weakness, right arm 1/5  dysarthric speech. LE 4/5    Data Reviewed: I have personally reviewed following labs and imaging studies  CBC: Recent Labs  Lab 10/20/22 1453 10/20/22 1501 10/21/22 0435 10/22/22 0427  WBC 6.1  --  7.0 7.0  NEUTROABS 4.8  --   --   --   HGB 11.8* 12.6* 10.0* 8.6*  HCT 36.8* 37.0* 29.4* 25.0*  MCV 90.0  --  89.6 86.8  PLT 249  --  212 195    Basic Metabolic Panel: Recent Labs  Lab 10/20/22 1453 10/20/22 1501 10/21/22 0435 10/21/22 1456 10/22/22 0427  NA 137 140 139  --  138  K 2.9* 2.9* 3.9  --  3.0*  CL 105 106 111  --  111  CO2 19*  --  12*  --  19*  GLUCOSE 108* 107* 93  --  104*  BUN 41* 39* 43*  --  40*  CREATININE 5.85* 6.40* 5.63*  --  5.16*  CALCIUM 7.1*  --  7.1*  --  6.4*  MG  --   --   --  1.5*  --     GFR: Estimated Creatinine Clearance: 11.4 mL/min (A) (by C-G formula based on SCr of 5.16 mg/dL (H)). Liver Function Tests: Recent Labs  Lab 10/20/22 1453 10/22/22 0427  AST 26  --   ALT 19  --   ALKPHOS 81  --   BILITOT 0.5  --   PROT 7.2  --   ALBUMIN 3.7 2.7*    No results for input(s): "LIPASE", "AMYLASE" in the last 168 hours. No results for input(s): "AMMONIA" in the last 168 hours. Coagulation Profile: Recent Labs  Lab 10/20/22 1453  INR 1.1    Cardiac Enzymes: No results for input(s): "CKTOTAL", "CKMB", "CKMBINDEX", "TROPONINI" in the last 168 hours. BNP (last 3 results) No results for input(s): "PROBNP" in the last 8760 hours. HbA1C: Recent Labs    10/20/22 1453 10/21/22 0625  HGBA1C 5.5 5.5   CBG: No results for input(s): "GLUCAP" in the last 168 hours. Lipid Profile: Recent Labs    10/21/22 0435  CHOL 132  HDL 46  LDLCALC 65  TRIG 107  CHOLHDL 2.9    Thyroid Function Tests: No  results for input(s): "TSH", "T4TOTAL", "FREET4", "T3FREE", "THYROIDAB" in the last 72 hours. Anemia Panel: No results for input(s): "VITAMINB12", "FOLATE", "FERRITIN", "TIBC", "IRON", "RETICCTPCT" in the last 72 hours. Sepsis Labs: No results for input(s): "PROCALCITON", "LATICACIDVEN" in the last 168 hours.  No results found for this or any previous visit (from the past 240 hour(s)).       Radiology Studies: VAS US CAROTID (at [facility] and [facility] only)  Result Date: 10/22/2022 Carotid Arterial Duplex Study Patient Name:  TYKWON FERA  Date of Exam:   10/21/2022 Medical Rec #: [id number]  Accession #:    [id number] Date of Birth: Oct 24, 1946      Patient Gender: M Patient Age:   51 years Exam Location:  [facility] Procedure:      VAS US CAROTID Referring Phys: Wandra Feinstein RATHORE --------------------------------------------------------------------------------  Indications:       CVA. Risk Factors:      Hypertension, hyperlipidemia, past history of smoking, prior                    CVA, PAD. Comparison Study:  Left ICA occlusion noted on MRA head 10-20-2022 Performing Technologist: Darlin Coco RDMS, RVT  Examination Guidelines: A complete evaluation includes B-mode imaging, spectral Doppler, color Doppler, and power Doppler as needed of all accessible portions of each vessel. Bilateral testing is considered an integral part of a complete examination. Limited examinations for reoccurring indications may be performed as noted.  Right Carotid Findings: +----------+--------+--------+--------+-------------------------+--------+           PSV cm/sEDV cm/sStenosisPlaque Description       Comments +----------+--------+--------+--------+-------------------------+--------+ CCA Prox  100     15                                                +----------+--------+--------+--------+-------------------------+--------+ CCA Distal81      23                                                 +----------+--------+--------+--------+-------------------------+--------+ ICA Prox  193     40      40-59%  calcific and heterogenous         +----------+--------+--------+--------+-------------------------+--------+ ICA Mid   86      26                                                +----------+--------+--------+--------+-------------------------+--------+ ICA Distal51      22                                                +----------+--------+--------+--------+-------------------------+--------+ ECA       272     28      >50%                                      +----------+--------+--------+--------+-------------------------+--------+ +----------+--------+-------+---------+-------------------+           PSV cm/sEDV cmsDescribe Arm Pressure (mmHG) +----------+--------+-------+---------+-------------------+ [id number]            Turbulent                    +----------+--------+-------+---------+-------------------+ +---------+--------+---+--------+--+---------+ VertebralPSV cm/s100EDV cm/s16Antegrade +---------+--------+---+--------+--+---------+  Left Carotid Findings: +----------+--------+--------+--------+---------------------------+--------+           PSV cm/sEDV cm/sStenosisPlaque Description         Comments +----------+--------+--------+--------+---------------------------+--------+ CCA Prox  125     18                                                  +----------+--------+--------+--------+---------------------------+--------+  CCA Distal75      15                                                  +----------+--------+--------+--------+---------------------------+--------+ ICA Prox                  Occludedhypoechoic and heterogenous         +----------+--------+--------+--------+---------------------------+--------+ ICA Mid                   Occludedhypoechoic and heterogenous          +----------+--------+--------+--------+---------------------------+--------+ ICA Distal                Occludedhypoechoic and heterogenous         +----------+--------+--------+--------+---------------------------+--------+ ECA       318     47      >50%                                        +----------+--------+--------+--------+---------------------------+--------+ +----------+--------+--------+----------------+-------------------+           PSV cm/sEDV cm/sDescribe        Arm Pressure (mmHG) +----------+--------+--------+----------------+-------------------+ [id number]             Multiphasic, WNL                    +----------+--------+--------+----------------+-------------------+ +---------+--------+---+--------+--+---------+ VertebralPSV cm/s144EDV cm/s36Antegrade +---------+--------+---+--------+--+---------+   Summary: Right Carotid: Velocities in the right ICA are consistent with a 40-59%                stenosis. The ECA appears >50% stenosed. Left Carotid: Evidence consistent with a total occlusion of the left ICA. The               ECA appears >50% stenosed. Vertebrals:  Bilateral vertebral arteries demonstrate antegrade flow. Subclavians: Right subclavian artery flow was disturbed. Normal flow              hemodynamics were seen in the left subclavian artery. *See table(s) above for measurements and observations.  Electronically signed by Antony Contras MD on 10/22/2022 at 8:25:16 AM.    Final    ECHOCARDIOGRAM COMPLETE  Result Date: 10/21/2022    ECHOCARDIOGRAM REPORT   Patient Name:   DANIELL MANCINAS Date of Exam: 10/21/2022 Medical Rec #:  [id number]      Height:       69.0 in Accession #:    [id number]     Weight:       190.0 lb Date of Birth:  19-Apr-1946     BSA:          2.021 m Patient Age:    73 years       BP:           126/71 mmHg Patient Gender: M              HR:           63 bpm. Exam Location:  Inpatient Procedure: Color Doppler and Cardiac Doppler  Indications:    stroke  History:        Patient has no prior history of Echocardiogram examinations.                 Risk Factors:Hypertension and Dyslipidemia.  Sonographer:  Melissa Morford RDCS (AE, PE) Referring Phys: [id number] [place]  1. Left ventricular ejection fraction, by estimation, is 60 to 65%. The left ventricle has normal function. The left ventricle has no regional wall motion abnormalities. There is mild left ventricular hypertrophy. Left ventricular diastolic parameters are consistent with Grade I diastolic dysfunction (impaired relaxation).  2. Right ventricular systolic function is normal. The right ventricular size is normal.  3. Left atrial size was mild to moderately dilated.  4. Right atrial size was mildly dilated.  5. The mitral valve is normal in structure. No evidence of mitral valve regurgitation. No evidence of mitral stenosis.  6. The aortic valve is tricuspid. Aortic valve regurgitation is not visualized. No aortic stenosis is present.  7. The inferior vena cava is normal in size with greater than 50% respiratory variability, suggesting right atrial pressure of 3 mmHg. Conclusion(s)/Recommendation(s): No intracardiac source of embolism detected on this transthoracic study. Consider a transesophageal echocardiogram to exclude cardiac source of embolism if clinically indicated. FINDINGS  Left Ventricle: Left ventricular ejection fraction, by estimation, is 60 to 65%. The left ventricle has normal function. The left ventricle has no regional wall motion abnormalities. The left ventricular internal cavity size was normal in size. There is  mild left ventricular hypertrophy. Left ventricular diastolic parameters are consistent with Grade I diastolic dysfunction (impaired relaxation). Right Ventricle: The right ventricular size is normal. No increase in right ventricular wall thickness. Right ventricular systolic function is normal. Left Atrium: Left atrial size was  mild to moderately dilated. Right Atrium: Right atrial size was mildly dilated. Pericardium: There is no evidence of pericardial effusion. Mitral Valve: The mitral valve is normal in structure. No evidence of mitral valve regurgitation. No evidence of mitral valve stenosis. Tricuspid Valve: The tricuspid valve is normal in structure. Tricuspid valve regurgitation is trivial. No evidence of tricuspid stenosis. Aortic Valve: The aortic valve is tricuspid. Aortic valve regurgitation is not visualized. No aortic stenosis is present. Pulmonic Valve: The pulmonic valve was normal in structure. Pulmonic valve regurgitation is not visualized. No evidence of pulmonic stenosis. Aorta: The aortic root is normal in size and structure. Venous: The inferior vena cava is normal in size with greater than 50% respiratory variability, suggesting right atrial pressure of 3 mmHg. IAS/Shunts: No atrial level shunt detected by color flow Doppler.  LEFT VENTRICLE PLAX 2D LVIDd:         4.90 cm      Diastology LVIDs:         3.50 cm      LV e' medial:    5.00 cm/s LV PW:         1.20 cm      LV E/e' medial:  16.0 LV IVS:        1.20 cm      LV e' lateral:   5.00 cm/s LVOT diam:     2.10 cm      LV E/e' lateral: 16.0 LV SV:         70 LV SV Index:   35 LVOT Area:     3.46 cm  LV Volumes (MOD) LV vol d, MOD A2C: 64.2 ml LV vol d, MOD A4C: 141.0 ml LV vol s, MOD A2C: 42.9 ml LV vol s, MOD A4C: 70.4 ml LV SV MOD A2C:     21.3 ml LV SV MOD A4C:     141.0 ml LV SV MOD BP:      45.5 ml RIGHT VENTRICLE RV  S prime:     15.30 cm/s TAPSE (M-mode): 2.5 cm LEFT ATRIUM              Index        RIGHT ATRIUM           Index LA diam:        3.10 cm  1.53 cm/m   RA Area:     21.60 cm LA Vol (A2C):   52.3 ml  25.87 ml/m  RA Volume:   67.60 ml  33.44 ml/m LA Vol (A4C):   106.0 ml 52.44 ml/m LA Biplane Vol: 76.1 ml  37.65 ml/m  AORTIC VALVE LVOT Vmax:   95.10 cm/s LVOT Vmean:  70.500 cm/s LVOT VTI:    0.202 m  AORTA Ao Root diam: 3.40 cm MITRAL VALVE                 TRICUSPID VALVE MV Area (PHT): 4.12 cm     TR Peak grad:   10.9 mmHg MV Decel Time: 184 msec     TR Vmax:        165.00 cm/s MV E velocity: 80.20 cm/s MV A velocity: 108.00 cm/s  SHUNTS MV E/A ratio:  0.74         Systemic VTI:  0.20 m                             Systemic Diam: 2.10 cm Candee Furbish MD Electronically signed by Candee Furbish MD Signature Date/Time: 10/21/2022/11:12:02 AM    Final    US RENAL  Result Date: 10/21/2022 CLINICAL DATA:  [id number] with acute kidney injury. EXAM: RENAL / URINARY TRACT ULTRASOUND COMPLETE COMPARISON:  Renal ultrasound 12/11/2019 FINDINGS: Right Kidney: Renal measurements: 9.1 x 4.3 x 3.7 cm = volume: 74.5 mL, previously 89 mL. Echogenicity in general mildly increased as before. No mass, stones or hydronephrosis visualized. Left Kidney: Renal measurements: 10.2 x 4.4 x 4.1 = volume: 98.3 mL, previously 125 mL. Echogenicity in general mildly increased as before. There is a 3 cm simple cyst in the inferior pole, stable. No mass or hydronephrosis visualized. Bladder: Appears normal for degree of bladder distention. Other: None. IMPRESSION: 1. No acute findings. No hydronephrosis. 2. Mildly increased echogenicity of both kidneys as before, compatible with medical renal disease. 3. Stable 3 cm simple cyst in the inferior pole of the left kidney. 4. By measurements there appears to be a minimal to mild interval volume loss in both kidneys Electronically Signed   By: Telford Nab M.D.   On: 10/21/2022 00:51   MR ANGIO HEAD WO CONTRAST  Result Date: 10/20/2022 CLINICAL DATA:  Stroke or TIA EXAM: MRA HEAD WITHOUT CONTRAST TECHNIQUE: Angiographic images of the Circle of Willis were acquired using MRA technique without intravenous contrast. COMPARISON:  None Available. FINDINGS: POSTERIOR CIRCULATION: --Vertebral arteries: Normal --Inferior cerebellar arteries: Normal. --Basilar artery: Normal. --Superior cerebellar arteries: Normal. --Posterior cerebral arteries:  Normal. ANTERIOR CIRCULATION: --Intracranial internal carotid arteries: Loss of normal flow related enhancement within the left internal carotid artery. Diminished flow related enhancement of the distal intracranial ICA. --Anterior cerebral arteries (ACA): Normal. --Middle cerebral arteries (MCA): Poor flow related enhancement at the left MCA. Right MCA is normal. ANATOMIC VARIANTS: None IMPRESSION: 1. Occlusion of the left ICA proximal to the skull base. 2. Diminished flow related enhancement in the left MCA. CTA of the head and neck may provide a more complete assessment. Electronically Signed  By: Ulyses Jarred M.D.   On: 10/20/2022 23:32   MR BRAIN WO CONTRAST  Result Date: 10/20/2022 CLINICAL DATA:  Acute neurologic deficit EXAM: MRI HEAD WITHOUT CONTRAST TECHNIQUE: Multiplanar, multiecho pulse sequences of the brain and surrounding structures were obtained without intravenous contrast. COMPARISON:  07/31/2009 FINDINGS: Brain: Large area of acute ischemia within the anterior left MCA territory. There are bilateral subdural hematomas right greater than left. There is confluent hyperintense T2-weighted signal within the white matter. Generalized volume loss. Encephalomalacia at the site of old left anterior cerebral artery infarct. The midline structures are normal. Vascular: Major flow voids are preserved. Skull and upper cervical spine: Normal calvarium and skull base. Visualized upper cervical spine and soft tissues are normal. Sinuses/Orbits:No paranasal sinus fluid levels or advanced mucosal thickening. No mastoid or middle ear effusion. Normal orbits. IMPRESSION: 1. Large area of acute ischemia within the anterior left MCA territory. No hemorrhage or mass effect. 2. Bilateral subdural hematomas right greater than left. Electronically Signed   By: Ulyses Jarred M.D.   On: 10/20/2022 20:18   CT Head Wo Contrast  Result Date: 10/20/2022 CLINICAL DATA:  Neuro deficit, acute, stroke suspected EXAM: CT  HEAD WITHOUT CONTRAST TECHNIQUE: Contiguous axial images were obtained from the base of the skull through the vertex without intravenous contrast. RADIATION DOSE REDUCTION: This exam was performed according to the departmental dose-optimization program which includes automated exposure control, adjustment of the mA and/or kV according to patient size and/or use of iterative reconstruction technique. COMPARISON:  None Available. BRAIN: BRAIN Cerebral ventricle sizes are concordant with the degree of cerebral volume loss. Patchy and confluent areas of decreased attenuation are noted throughout the deep and periventricular white matter of the cerebral hemispheres bilaterally, compatible with chronic microvascular ischemic disease. Chronic left frontoparietal and temporal encephalomalacia. No evidence of large-territorial acute infarction. No parenchymal hemorrhage. No mass lesion. Acute density 18mm right subdural hematoma extending along the falx cerebral and tentorium. Query separate 28mm left parafalcine hematoma. No mass effect or midline shift. No hydrocephalus. Basilar cisterns are patent. Vascular: No hyperdense vessel. Atherosclerotic calcifications are present within the cavernous internal carotid and vertebral arteries. Skull: No acute fracture or focal lesion. Sinuses/Orbits: Paranasal sinuses and mastoid air cells are clear. The orbits are unremarkable. Other: None. IMPRESSION: 1. Acute 6 mm right subdural hematoma extending along the falx cerebri and tentorium. No definite associated mass effect. 2. Query separate 1mm left parafalcine hematoma. These results were called by telephone at the time of interpretation on 10/20/2022 at 5:04 pm to provider [facility] , who verbally acknowledged these results. Electronically Signed   By: Iven Finn M.D.   On: 10/20/2022 17:05        Scheduled Meds:   stroke: early stages of recovery book   Does not apply Once   atorvastatin  40 mg Oral Daily   calcium  carbonate  1 tablet Oral BID WC   folic acid  1 mg Oral Daily   heparin injection (subcutaneous)  5,000 Units Subcutaneous Q8H   nystatin  5 mL Oral QID   potassium chloride  40 mEq Oral TID   sodium bicarbonate  650 mg Oral TID   tamsulosin  0.4 mg Oral Daily   Vitamin D (Ergocalciferol)  50,000 Units Oral Q7 days   Continuous Infusions:  sodium bicarbonate 75 mEq in sodium chloride 0.45 % 1,075 mL infusion 75 mL/hr at 10/22/22 0939     LOS: 2 days    Time spent: 35 minutes  Elmarie Shiley, MD [facility]   If 7PM-7AM, please contact night-coverage [email or web address]  10/22/2022, 9:50 AM

## 2022-10-22 NOTE — Progress Notes (Signed)
[facility] NEPHROLOGY PROGRESS NOTE  Assessment/ Plan: Pt is a 76 y.o. yo male  with past medical history significant for hypertension, HLD, BPH, prior stroke with residual right hemiparesis, CKD 3B, presented with right sided facial droop and slurred speech, seen as a consultation for the evaluation of acute kidney injury on CKD.   #Acute kidney injury on CKD stage IIIb/IV: cr around 2.2-2.7 in 2021, no recent baseline creatinine level available.  Kidney ultrasound with chronic finding and volume loss.  This is likely acute kidney injury due to hemodynamically mediated in the setting of decreased oral intake concomitant with the use of ACE inhibitor.  Received IV fluid in the ER.  He has increased urine output and creatinine level is slowly trending down.  I will continue IV fluid with sodium bicarbonate, increase rate to 100 cc an hour. Daily lab and strict ins and out.  Expect gradual renal recovery.  No acute indication for dialysis.   #Acute left MCA ischemic stroke/acute bilateral SDH: No intervention per neurosurgeon.  Neurology is following.  Per NS, only aspirin for anticoagulation.   #Hypertension: Need permissive hypertension because of acute stroke.  Antihypertensive on hold currently.  Monitor BP.   #Metabolic acidosis due to renal failure: Currently on sodium bicarbonate.   #History of BPH: On Flomax.  US renal without hydronephrosis.   #Hypokalemia: Replete potassium chloride.  Monitor lab.  #Hypomagnesemia: Received IV magnesium this morning.  #Hypocalcemia: Starting vitamin D and replenishing calcium.  Check PTH level.  # Anemia due to CKD: Drop in hemoglobin noted probably dilutional, I will check iron studies.   Subjective: Seen and examined.  Urine output around 1.6 L.  Denies nausea, vomiting, chest pain, shortness of breath. Objective Vital signs in last 24 hours: Vitals:   10/21/22 2055 10/21/22 2337 10/22/22 0402 10/22/22 0823  BP:  (!) 159/81  (!) 164/74 (!) 162/72  Pulse:  76 74 73  Resp:  16 18 14   Temp:  99.2 F (37.3 C) 97.6 F (36.4 C) 99 F (37.2 C)  TempSrc:  Oral  Oral  SpO2:  98% 100% 100%  Weight: 66.1 kg     Height:       Weight change: -20.1 kg  Intake/Output Summary (Last 24 hours) at 10/22/2022 0949 Last data filed at 10/21/2022 2337 Gross per 24 hour  Intake --  Output 1600 ml  Net -1600 ml       Labs: RENAL PANEL Recent Labs    10/20/22 1453 10/20/22 1501 10/21/22 0435 10/21/22 1456 10/22/22 0427  NA 137 140 139  --  138  K 2.9* 2.9* 3.9  --  3.0*  CL 105 106 111  --  111  CO2 19*  --  12*  --  19*  GLUCOSE 108* 107* 93  --  104*  BUN 41* 39* 43*  --  40*  CREATININE 5.85* 6.40* 5.63*  --  5.16*  CALCIUM 7.1*  --  7.1*  --  6.4*  MG  --   --   --  1.5*  --   ALBUMIN 3.7  --   --   --  2.7*     Liver Function Tests: Recent Labs  Lab 10/20/22 1453 10/22/22 0427  AST 26  --   ALT 19  --   ALKPHOS 81  --   BILITOT 0.5  --   PROT 7.2  --   ALBUMIN 3.7 2.7*   No results for input(s): "LIPASE", "AMYLASE" in the last  168 hours. No results for input(s): "AMMONIA" in the last 168 hours. CBC: Recent Labs    10/20/22 1453 10/20/22 1501 10/21/22 0435 10/22/22 0427  HGB 11.8* 12.6* 10.0* 8.6*  MCV 90.0  --  89.6 86.8    Cardiac Enzymes: No results for input(s): "CKTOTAL", "CKMB", "CKMBINDEX", "TROPONINI" in the last 168 hours. CBG: No results for input(s): "GLUCAP" in the last 168 hours.  Iron Studies: No results for input(s): "IRON", "TIBC", "TRANSFERRIN", "FERRITIN" in the last 72 hours. Studies/Results: VAS US CAROTID (at [facility] and [facility] only)  Result Date: 10/22/2022 Carotid Arterial Duplex Study Patient Name:  BRETT DARKO  Date of Exam:   10/21/2022 Medical Rec #: [id number]       Accession #:    [id number] Date of Birth: [birth year]      Patient Gender: M Patient Age:   72 years Exam Location:  [facility] Procedure:      VAS US CAROTID Referring Phys: Wandra Feinstein  RATHORE --------------------------------------------------------------------------------  Indications:       CVA. Risk Factors:      Hypertension, hyperlipidemia, past history of smoking, prior                    CVA, PAD. Comparison Study:  Left ICA occlusion noted on MRA head 10-20-2022 Performing Technologist: Darlin Coco RDMS, RVT  Examination Guidelines: A complete evaluation includes B-mode imaging, spectral Doppler, color Doppler, and power Doppler as needed of all accessible portions of each vessel. Bilateral testing is considered an integral part of a complete examination. Limited examinations for reoccurring indications may be performed as noted.  Right Carotid Findings: +----------+--------+--------+--------+-------------------------+--------+           PSV cm/sEDV cm/sStenosisPlaque Description       Comments +----------+--------+--------+--------+-------------------------+--------+ CCA Prox  100     15                                                +----------+--------+--------+--------+-------------------------+--------+ CCA Distal81      23                                                +----------+--------+--------+--------+-------------------------+--------+ ICA Prox  193     40      40-59%  calcific and heterogenous         +----------+--------+--------+--------+-------------------------+--------+ ICA Mid   86      26                                                +----------+--------+--------+--------+-------------------------+--------+ ICA Distal51      22                                                +----------+--------+--------+--------+-------------------------+--------+ ECA       272     28      >50%                                      +----------+--------+--------+--------+-------------------------+--------+ +----------+--------+-------+---------+-------------------+  PSV cm/sEDV cmsDescribe Arm Pressure (mmHG)  +----------+--------+-------+---------+-------------------+ [id number]            Turbulent                    +----------+--------+-------+---------+-------------------+ +---------+--------+---+--------+--+---------+ VertebralPSV cm/s100EDV cm/s16Antegrade +---------+--------+---+--------+--+---------+  Left Carotid Findings: +----------+--------+--------+--------+---------------------------+--------+           PSV cm/sEDV cm/sStenosisPlaque Description         Comments +----------+--------+--------+--------+---------------------------+--------+ CCA Prox  125     18                                                  +----------+--------+--------+--------+---------------------------+--------+ CCA Distal75      15                                                  +----------+--------+--------+--------+---------------------------+--------+ ICA Prox                  Occludedhypoechoic and heterogenous         +----------+--------+--------+--------+---------------------------+--------+ ICA Mid                   Occludedhypoechoic and heterogenous         +----------+--------+--------+--------+---------------------------+--------+ ICA Distal                Occludedhypoechoic and heterogenous         +----------+--------+--------+--------+---------------------------+--------+ ECA       318     47      >50%                                        +----------+--------+--------+--------+---------------------------+--------+ +----------+--------+--------+----------------+-------------------+           PSV cm/sEDV cm/sDescribe        Arm Pressure (mmHG) +----------+--------+--------+----------------+-------------------+ [id number]             Multiphasic, WNL                    +----------+--------+--------+----------------+-------------------+ +---------+--------+---+--------+--+---------+ VertebralPSV cm/s144EDV cm/s36Antegrade  +---------+--------+---+--------+--+---------+   Summary: Right Carotid: Velocities in the right ICA are consistent with a 40-59%                stenosis. The ECA appears >50% stenosed. Left Carotid: Evidence consistent with a total occlusion of the left ICA. The               ECA appears >50% stenosed. Vertebrals:  Bilateral vertebral arteries demonstrate antegrade flow. Subclavians: Right subclavian artery flow was disturbed. Normal flow              hemodynamics were seen in the left subclavian artery. *See table(s) above for measurements and observations.  Electronically signed by Antony Contras MD on 10/22/2022 at 8:25:16 AM.    Final    ECHOCARDIOGRAM COMPLETE  Result Date: 10/21/2022    ECHOCARDIOGRAM REPORT   Patient Name:   HARLEM BULA Date of Exam: 10/21/2022 Medical Rec #:  [id number]      Height:       69.0 in Accession #:    [id number]     Weight:  190.0 lb Date of Birth:  [birth year]     BSA:          2.021 m Patient Age:    48 years       BP:           126/71 mmHg Patient Gender: M              HR:           63 bpm. Exam Location:  Inpatient Procedure: Color Doppler and Cardiac Doppler Indications:    stroke  History:        Patient has no prior history of Echocardiogram examinations.                 Risk Factors:Hypertension and Dyslipidemia.  Sonographer:    Melissa Morford RDCS (AE, PE) Referring Phys: [id number] VASUNDHRA [place]  1. Left ventricular ejection fraction, by estimation, is 60 to 65%. The left ventricle has normal function. The left ventricle has no regional wall motion abnormalities. There is mild left ventricular hypertrophy. Left ventricular diastolic parameters are consistent with Grade I diastolic dysfunction (impaired relaxation).  2. Right ventricular systolic function is normal. The right ventricular size is normal.  3. Left atrial size was mild to moderately dilated.  4. Right atrial size was mildly dilated.  5. The mitral valve is normal in structure. No  evidence of mitral valve regurgitation. No evidence of mitral stenosis.  6. The aortic valve is tricuspid. Aortic valve regurgitation is not visualized. No aortic stenosis is present.  7. The inferior vena cava is normal in size with greater than 50% respiratory variability, suggesting right atrial pressure of 3 mmHg. Conclusion(s)/Recommendation(s): No intracardiac source of embolism detected on this transthoracic study. Consider a transesophageal echocardiogram to exclude cardiac source of embolism if clinically indicated. FINDINGS  Left Ventricle: Left ventricular ejection fraction, by estimation, is 60 to 65%. The left ventricle has normal function. The left ventricle has no regional wall motion abnormalities. The left ventricular internal cavity size was normal in size. There is  mild left ventricular hypertrophy. Left ventricular diastolic parameters are consistent with Grade I diastolic dysfunction (impaired relaxation). Right Ventricle: The right ventricular size is normal. No increase in right ventricular wall thickness. Right ventricular systolic function is normal. Left Atrium: Left atrial size was mild to moderately dilated. Right Atrium: Right atrial size was mildly dilated. Pericardium: There is no evidence of pericardial effusion. Mitral Valve: The mitral valve is normal in structure. No evidence of mitral valve regurgitation. No evidence of mitral valve stenosis. Tricuspid Valve: The tricuspid valve is normal in structure. Tricuspid valve regurgitation is trivial. No evidence of tricuspid stenosis. Aortic Valve: The aortic valve is tricuspid. Aortic valve regurgitation is not visualized. No aortic stenosis is present. Pulmonic Valve: The pulmonic valve was normal in structure. Pulmonic valve regurgitation is not visualized. No evidence of pulmonic stenosis. Aorta: The aortic root is normal in size and structure. Venous: The inferior vena cava is normal in size with greater than 50% respiratory  variability, suggesting right atrial pressure of 3 mmHg. IAS/Shunts: No atrial level shunt detected by color flow Doppler.  LEFT VENTRICLE PLAX 2D LVIDd:         4.90 cm      Diastology LVIDs:         3.50 cm      LV e' medial:    5.00 cm/s LV PW:         1.20 cm  LV E/e' medial:  16.0 LV IVS:        1.20 cm      LV e' lateral:   5.00 cm/s LVOT diam:     2.10 cm      LV E/e' lateral: 16.0 LV SV:         70 LV SV Index:   35 LVOT Area:     3.46 cm  LV Volumes (MOD) LV vol d, MOD A2C: 64.2 ml LV vol d, MOD A4C: 141.0 ml LV vol s, MOD A2C: 42.9 ml LV vol s, MOD A4C: 70.4 ml LV SV MOD A2C:     21.3 ml LV SV MOD A4C:     141.0 ml LV SV MOD BP:      45.5 ml RIGHT VENTRICLE RV S prime:     15.30 cm/s TAPSE (M-mode): 2.5 cm LEFT ATRIUM              Index        RIGHT ATRIUM           Index LA diam:        3.10 cm  1.53 cm/m   RA Area:     21.60 cm LA Vol (A2C):   52.3 ml  25.87 ml/m  RA Volume:   67.60 ml  33.44 ml/m LA Vol (A4C):   106.0 ml 52.44 ml/m LA Biplane Vol: 76.1 ml  37.65 ml/m  AORTIC VALVE LVOT Vmax:   95.10 cm/s LVOT Vmean:  70.500 cm/s LVOT VTI:    0.202 m  AORTA Ao Root diam: 3.40 cm MITRAL VALVE                TRICUSPID VALVE MV Area (PHT): 4.12 cm     TR Peak grad:   10.9 mmHg MV Decel Time: 184 msec     TR Vmax:        165.00 cm/s MV E velocity: 80.20 cm/s MV A velocity: 108.00 cm/s  SHUNTS MV E/A ratio:  0.74         Systemic VTI:  0.20 m                             Systemic Diam: 2.10 cm Candee Furbish MD Electronically signed by Candee Furbish MD Signature Date/Time: 10/21/2022/11:12:02 AM    Final    US RENAL  Result Date: 10/21/2022 CLINICAL DATA:  [id number] with acute kidney injury. EXAM: RENAL / URINARY TRACT ULTRASOUND COMPLETE COMPARISON:  Renal ultrasound 12/11/2019 FINDINGS: Right Kidney: Renal measurements: 9.1 x 4.3 x 3.7 cm = volume: 74.5 mL, previously 89 mL. Echogenicity in general mildly increased as before. No mass, stones or hydronephrosis visualized. Left Kidney: Renal  measurements: 10.2 x 4.4 x 4.1 = volume: 98.3 mL, previously 125 mL. Echogenicity in general mildly increased as before. There is a 3 cm simple cyst in the inferior pole, stable. No mass or hydronephrosis visualized. Bladder: Appears normal for degree of bladder distention. Other: None. IMPRESSION: 1. No acute findings. No hydronephrosis. 2. Mildly increased echogenicity of both kidneys as before, compatible with medical renal disease. 3. Stable 3 cm simple cyst in the inferior pole of the left kidney. 4. By measurements there appears to be a minimal to mild interval volume loss in both kidneys Electronically Signed   By: Telford Nab M.D.   On: 10/21/2022 00:51   MR ANGIO HEAD WO CONTRAST  Result Date: 10/20/2022 CLINICAL DATA:  Stroke or  TIA EXAM: MRA HEAD WITHOUT CONTRAST TECHNIQUE: Angiographic images of the Circle of Willis were acquired using MRA technique without intravenous contrast. COMPARISON:  None Available. FINDINGS: POSTERIOR CIRCULATION: --Vertebral arteries: Normal --Inferior cerebellar arteries: Normal. --Basilar artery: Normal. --Superior cerebellar arteries: Normal. --Posterior cerebral arteries: Normal. ANTERIOR CIRCULATION: --Intracranial internal carotid arteries: Loss of normal flow related enhancement within the left internal carotid artery. Diminished flow related enhancement of the distal intracranial ICA. --Anterior cerebral arteries (ACA): Normal. --Middle cerebral arteries (MCA): Poor flow related enhancement at the left MCA. Right MCA is normal. ANATOMIC VARIANTS: None IMPRESSION: 1. Occlusion of the left ICA proximal to the skull base. 2. Diminished flow related enhancement in the left MCA. CTA of the head and neck may provide a more complete assessment. Electronically Signed   By: Ulyses Jarred M.D.   On: 10/20/2022 23:32   MR BRAIN WO CONTRAST  Result Date: 10/20/2022 CLINICAL DATA:  Acute neurologic deficit EXAM: MRI HEAD WITHOUT CONTRAST TECHNIQUE: Multiplanar,  multiecho pulse sequences of the brain and surrounding structures were obtained without intravenous contrast. COMPARISON:  07/31/2009 FINDINGS: Brain: Large area of acute ischemia within the anterior left MCA territory. There are bilateral subdural hematomas right greater than left. There is confluent hyperintense T2-weighted signal within the white matter. Generalized volume loss. Encephalomalacia at the site of old left anterior cerebral artery infarct. The midline structures are normal. Vascular: Major flow voids are preserved. Skull and upper cervical spine: Normal calvarium and skull base. Visualized upper cervical spine and soft tissues are normal. Sinuses/Orbits:No paranasal sinus fluid levels or advanced mucosal thickening. No mastoid or middle ear effusion. Normal orbits. IMPRESSION: 1. Large area of acute ischemia within the anterior left MCA territory. No hemorrhage or mass effect. 2. Bilateral subdural hematomas right greater than left. Electronically Signed   By: Ulyses Jarred M.D.   On: 10/20/2022 20:18   CT Head Wo Contrast  Result Date: 10/20/2022 CLINICAL DATA:  Neuro deficit, acute, stroke suspected EXAM: CT HEAD WITHOUT CONTRAST TECHNIQUE: Contiguous axial images were obtained from the base of the skull through the vertex without intravenous contrast. RADIATION DOSE REDUCTION: This exam was performed according to the departmental dose-optimization program which includes automated exposure control, adjustment of the mA and/or kV according to patient size and/or use of iterative reconstruction technique. COMPARISON:  None Available. BRAIN: BRAIN Cerebral ventricle sizes are concordant with the degree of cerebral volume loss. Patchy and confluent areas of decreased attenuation are noted throughout the deep and periventricular white matter of the cerebral hemispheres bilaterally, compatible with chronic microvascular ischemic disease. Chronic left frontoparietal and temporal encephalomalacia. No  evidence of large-territorial acute infarction. No parenchymal hemorrhage. No mass lesion. Acute density 48mm right subdural hematoma extending along the falx cerebral and tentorium. Query separate 58mm left parafalcine hematoma. No mass effect or midline shift. No hydrocephalus. Basilar cisterns are patent. Vascular: No hyperdense vessel. Atherosclerotic calcifications are present within the cavernous internal carotid and vertebral arteries. Skull: No acute fracture or focal lesion. Sinuses/Orbits: Paranasal sinuses and mastoid air cells are clear. The orbits are unremarkable. Other: None. IMPRESSION: 1. Acute 6 mm right subdural hematoma extending along the falx cerebri and tentorium. No definite associated mass effect. 2. Query separate 19mm left parafalcine hematoma. These results were called by telephone at the time of interpretation on 10/20/2022 at 5:04 pm to provider [facility] , who verbally acknowledged these results. Electronically Signed   By: Iven Finn M.D.   On: 10/20/2022 17:05    Medications: Infusions:  sodium bicarbonate 75 mEq in sodium chloride 0.45 % 1,075 mL infusion 75 mL/hr at 10/22/22 0940    Scheduled Medications:   stroke: early stages of recovery book   Does not apply Once   atorvastatin  40 mg Oral Daily   calcium carbonate  1 tablet Oral BID WC   folic acid  1 mg Oral Daily   heparin injection (subcutaneous)  5,000 Units Subcutaneous Q8H   nystatin  5 mL Oral QID   potassium chloride  40 mEq Oral TID   sodium bicarbonate  650 mg Oral TID   tamsulosin  0.4 mg Oral Daily   Vitamin D (Ergocalciferol)  50,000 Units Oral Q7 days    have reviewed scheduled and prn medications.  Physical Exam: General:NAD, comfortable, able to lie flat and dry mucous membrane Heart:RRR, s1s2 nl Lungs:clear b/l, no crackle Abdomen:soft, Non-tender, non-distended Extremities:No edema Neurology: Alert, awake and oriented.  [place] Nabor Prasad Aylen [place] 10/22/2022,9:49 AM  LOS: 2 days

## 2022-10-22 NOTE — Progress Notes (Signed)
Inpatient Rehab Admissions:  Inpatient Rehab Consult received.  I met with patient at the bedside for rehabilitation assessment and to discuss goals and expectations of an inpatient rehab admission.  Discussed average length of stay, discharge home after completion of CIR, insurance authorization requirement. Pt acknowledged understanding. Pt interested in pursuing CIR. Pt gave permission to contact sister Hilton. Spoke with Rose on the telephone. She also acknowledged understanding and is supportive of pt pursuing CIR. She confirmed that she and other family will be able to provide 24/7 support for pt after discharge. Will continue to follow.  Signed: Gayland Curry, [place], [place] Admissions Coordinator [phone]

## 2022-10-22 NOTE — Progress Notes (Signed)
STROKE TEAM PROGRESS NOTE   INTERVAL HISTORY No family at the bedside. He is sitting in chair, AAO x 3. Still has moderate dysarthria and right hemiplegia. Will repeat CT in am.   Vitals:   10/21/22 2337 10/22/22 0402 10/22/22 0823 10/22/22 1113  BP: (!) 159/81 (!) 164/74 (!) 162/72 (!) 158/88  Pulse: 76 74 73 78  Resp: 16 18 14 16   Temp: 99.2 F (37.3 C) 97.6 F (36.4 C) 99 F (37.2 C) 99.3 F (37.4 C)  TempSrc: Oral  Oral Oral  SpO2: 98% 100% 100% 100%  Weight:      Height:       CBC:  Recent Labs  Lab 10/20/22 1453 10/20/22 1501 10/21/22 0435 10/22/22 0427  WBC 6.1  --  7.0 7.0  NEUTROABS 4.8  --   --   --   HGB 11.8*   < > 10.0* 8.6*  HCT 36.8*   < > 29.4* 25.0*  MCV 90.0  --  89.6 86.8  PLT 249  --  212 210   < > = values in this interval not displayed.   Basic Metabolic Panel:  Recent Labs  Lab 10/21/22 0435 10/21/22 1456 10/22/22 0427  NA 139  --  138  K 3.9  --  3.0*  CL 111  --  111  CO2 12*  --  19*  GLUCOSE 93  --  104*  BUN 43*  --  40*  CREATININE 5.63*  --  5.16*  CALCIUM 7.1*  --  6.4*  MG  --  1.5*  --    Lipid Panel:  Recent Labs  Lab 10/21/22 0435  CHOL 132  TRIG 107  HDL 46  CHOLHDL 2.9  VLDL 21  LDLCALC 65   HgbA1c:  Recent Labs  Lab 10/21/22 0625  HGBA1C 5.5   Urine Drug Screen: No results for input(s): "LABOPIA", "COCAINSCRNUR", "LABBENZ", "AMPHETMU", "THCU", "LABBARB" in the last 168 hours.  Alcohol Level  Recent Labs  Lab 10/20/22 1453  ETH <10    IMAGING past 24 hours No results found.  PHYSICAL EXAM  Temp:  [97.6 F (36.4 C)-99.3 F (37.4 C)] 99.3 F (37.4 C) (11/16 1113) Pulse Rate:  [56-83] 78 (11/16 1113) Resp:  [11-20] 16 (11/16 1113) BP: (129-164)/(61-88) 158/88 (11/16 1113) SpO2:  [98 %-100 %] 100 % (11/16 1113) Weight:  [66.1 kg] 66.1 kg (11/15 2055)  General - frail elderly male in NAD  Cardiovascular - Regular rhythm and rate.  Neuro - awake, alert, eyes open, orientated to age, place,  but not to time. No aphasia, following all simple commands but moderate dysarthria, paucity of speech. Able to name and repeat in dysarthric voice. No gaze palsy, tracking bilaterally, visual field not cooperative with constant eye movement but blinking to visual threat bilaterally. Right obvious facial droop. Tongue midline. LUE 4/5 at least, RUE proximal 0/5, bicep 2/5, bicep 1/5, finger movement 0/5 with increased muscle tone. LLE 3/5, RLE 3-/5 proximally and 2/5 distally. Sensation symmetrical bilaterally, left FTN intact grossly, gait not tested.   ASSESSMENT/PLAN [name] is a 76 y.o. male with history of hypertension, hyperlipidemia, stroke with residual right hemiparesis, CKD stage IIIb-IV, schizophrenia, BPH presented to the ED via EMS with right-sided facial droop and slurred speech. LKW 3 days ago.   Stroke:  Acute left MCA ischemic infarct likely due to chronic left ICA occlusion in the setting of AKI on CKD and hypotension  CT head Acute 6 mm right subdural  hematoma extending along the falx cerebri and tentorium.  MRI  Large area of acute ischemia within the anterior left MCA territory. Bilateral subdural hematomas right greater than left. MRA Occlusion of the left ICA proximal to the skull base. Diminished flow related enhancement in the left MCA. CT head repeat 11/17 pending  Carotid Doppler total occlusion left ICA, right ICA 40 to 59% stenosis 2D Echo EF 60 to 65% LDL 65 HgbA1c 5.5 VTE prophylaxis -heparin subcu clopidogrel 75 mg daily prior to admission, now on No antithrombotic due to bilateral ICH. NSG ok with ASA, repeat CT in am, if SDH stable, will start ASA 81.  Therapy recommendations:  CIR Disposition:  pending  Acute Bilateral SDH  NSGY consulted - no surgical intervention at this time  OK with ASA alone rather than DAPT Likely traumatic from fall Will repeat CT head in am for stability and consider starting ASA at that time   Left ICA occlusion Right  ICA stenosis Hx of stroke Per chart, pt had stroke in 2012 with left ICA stenosis, plan for left CEA in 6-8 weeks but eventually did not happen MRA Occlusion of the left ICA proximal to the skull base.  Carotid Doppler total occlusion left ICA, right ICA 40 to 59% stenosis No intervention needed at this time Follow up with [facility] as outpt  Hypertension Home meds:  norvasc 10mg , lisinopril 20 mg, metoprolol 100 mg  Stable BP goal 130-150 Avoid low BP Long-term BP goal normotensive  Hyperlipidemia Home meds:  atorvastatin 40mg , resumed in hospital LDL 65, goal < 70 Continue statin at discharge  Other Stroke Risk Factors Advanced Age >/= 86   Other Active Problems AKI on CKD - nephrology on board [facility] day # 2   Rosalin Hawking, MD PhD Stroke Neurology 10/22/2022 12:01 PM    To contact Stroke Continuity provider, please refer to [email or web address] After hours, contact General Neurology

## 2022-10-22 NOTE — PMR Pre-admission (Signed)
PMR Admission Coordinator Pre-Admission Assessment  Patient: Daniel [name] is an 76 y.o., male MRN: [id number] DOB: 03/14/1946 Height: _0  (175.3 cm) Weight: 66.1 kg (This was with bedscaled and this RN zeroed scale before patient was placed in bed.)  Insurance Information HMO: ye    PPO:      PCP:      IPA:      80/20:      OTHER:  PRIMARY: [employer]  Policy#: [id number]      Subscriber: patient CM Name: Jeanella Craze       Phone#:  [phone]    Fax#: [phone]  [place] called with approval 10/27/22  for admit 51/02-58/52 Pre-Cert#: [id number]      Employer:  Benefits:  Phone #: Albesa Seen     Name:  Irene Shipper Date: 12/07/2016- 12/06/2022 Deductible: $350 ($0 met) OOP Max: $8,300 ($0 met) CIR: $450/day co-pay with a max co-pay of $1,800/admission (4 days) SNF: $0/day co-pay for days 1-20, $196/day co-pay for days 21-100, limited to 100 days/cal yr  Outpatient:  $20-$40/visit co-pay  Home Health:  100% coverage  SECONDARY:       Policy#:      Phone#:   Development worker, community:       Phone#:   The "Data Collection Information Summary" for patients in Inpatient Rehabilitation Facilities with attached "Privacy Act [place] Records" was provided and verbally reviewed with: Patient  Emergency Contact Information Contact Information     Name Relation Home Work Mobile   [place] Sister [phone]         Current Medical History  Patient Admitting Diagnosis: CVA History of Present Illness: Pt is a  76 year old male with medical hx significant for: CVA (with right hemiparesis in 2021), HTN, hyperlipidemia, PVD, CKD stage IIIb-IV, schizophrenia. Pt [facility] d/t weakness (resulting in falls out of wheelchair during transfers) x2-3 days prior, right sided facial droop, slurred speech. CT showed SDH. Neurology consulted. MRI revealed bilateral SDH (right greater than left) and new left MCA territory stroke. MRA revealed total occlusion of  left carotid artery. Carotid doppler showed total occlusion of left ICA, right ICA 40-59% stenosis. 2D Echo showed EF 60-65%. No surgical intervention recommended for SDH. Nephrology consulted d/t AKI on CKD. Recommended a liter of NS bolus and continue with maintenance sodium bicarbonate fluid. No acute need for dialysis. Therapy evaluations completed and CIR recommended d/t pt's deficits in functional mobility and ADLs.  Complete NIHSS TOTAL: 10  Patient's medical record from [facility] has been reviewed by the rehabilitation admission coordinator and physician.  Past Medical History  Past Medical History:  Diagnosis Date   Hypertension    Psychosis ([place])    Schizophrenia ([place]) 1974   Stroke ([place]) 2010   Right Hemiparesis    Has the patient had major surgery during 100 days prior to admission? No  Family History   family history includes Breast cancer in his sister; CVA in his sister; Hypertension in his mother; Prostate cancer in his brother.  Current Medications  Current Facility-Administered Medications:     stroke: early stages of recovery book, , Does not apply, Once, Shela Leff, MD   acetaminophen (TYLENOL) tablet 650 mg, 650 mg, Oral, Q6H PRN **OR** acetaminophen (TYLENOL) suppository 650 mg, 650 mg, Rectal, Q6H PRN, Shela Leff, MD   atorvastatin (LIPITOR) tablet 40 mg, 40 mg, Oral, Daily, Rosalin Hawking, MD, 40 mg at 10/22/22 0935   calcium carbonate (OS-CAL - dosed in  mg of elemental calcium) tablet 1,250 mg, 1 tablet, Oral, BID WC, Regalado, Belkys A, MD, 1,250 mg at 87/86/76 7209   folic acid (FOLVITE) tablet 1 mg, 1 mg, Oral, Daily, Rosalin Hawking, MD, 1 mg at 10/22/22 0935   heparin injection 5,000 Units, 5,000 Units, Subcutaneous, Q8H, Rosalin Hawking, MD, 5,000 Units at 10/22/22 0626   hydrALAZINE (APRESOLINE) injection 5 mg, 5 mg, Intravenous, Q6H PRN, Regalado, Belkys A, MD   nystatin (MYCOSTATIN) 100000 UNIT/ML suspension 500,000 Units, 5 mL, Oral,  QID, Shela Leff, MD, 500,000 Units at 10/22/22 0935   potassium chloride SA (KLOR-CON M) CR tablet 40 mEq, 40 mEq, Oral, TID, Rosita Fire, MD, 40 mEq at 10/22/22 0935   sodium bicarbonate 75 mEq in sodium chloride 0.45 % 1,075 mL infusion, , Intravenous, Continuous, Rosita Fire, MD, Last Rate: 100 mL/hr at 10/22/22 0953, Rate Change at 10/22/22 0953   sodium bicarbonate tablet 650 mg, 650 mg, Oral, TID, Regalado, Belkys A, MD, 650 mg at 10/22/22 0935   tamsulosin (FLOMAX) capsule 0.4 mg, 0.4 mg, Oral, Daily, Rosalin Hawking, MD, 0.4 mg at 10/22/22 0935   Vitamin D (Ergocalciferol) (DRISDOL) 1.25 MG (50000 UNIT) capsule 50,000 Units, 50,000 Units, Oral, Q7 days, Regalado, Belkys A, MD, 50,000 Units at 10/21/22 1732  Patients Current Diet:  Diet Order             Diet Heart Room service appropriate? Yes; Fluid consistency: Thin  Diet effective now                   Precautions / Restrictions Precautions Precautions: Fall Restrictions Weight Bearing Restrictions: No   Has the patient had 2 or more falls or a fall with injury in the past year? Yes  Prior Activity Level Limited Community (1-2x/wk): gets out of house for MD appointments  Prior Functional Level Self Care: Did the patient need help bathing, dressing, using the toilet or eating? Independent  Indoor Mobility: Did the patient need assistance with walking from room to room (with or without device)? Pt used a wheelchair  Stairs: Did the patient need assistance with internal or external stairs (with or without device)? Pt used a wheelchair and ramps  Functional Cognition: Did the patient need help planning regular tasks such as shopping or remembering to take medications? Independent  Patient Information Are you of Hispanic, Latino/a,or Spanish origin?: A. No, not of Hispanic, Latino/a, or Spanish origin What is your race?: B. Black or African American Do you need or want an interpreter to  communicate with a doctor or health care staff?: 0. No  Patient's Response To:  Health Literacy and Transportation Is the patient able to respond to health literacy and transportation needs?: Yes Health Literacy - How often do you need to have someone help you when you read instructions, pamphlets, or other written material from your doctor or pharmacy?: Rarely In the past 12 months, has lack of transportation kept you from medical appointments or from getting medications?: No In the past 12 months, has lack of transportation kept you from meetings, work, or from getting things needed for daily living?: No  Development worker, international aid / [place] Devices/Equipment: None Home Equipment: Conservation officer, nature (2 wheels), Wheelchair - manual  Prior Device Use: Indicate devices/aids used by the patient prior to current illness, exacerbation or injury? Manual wheelchair  Current Functional Level Cognition  Arousal/Alertness: Awake/alert Overall Cognitive Status: Impaired/Different from baseline Orientation Level: Oriented X4 Safety/Judgement: Decreased awareness of deficits, Decreased awareness  of safety Attention: Sustained Sustained Attention: Impaired Sustained Attention Impairment: Verbal basic Memory: Impaired Memory Impairment: Storage deficit, Decreased recall of new information, Retrieval deficit Awareness: Impaired Awareness Impairment: Intellectual impairment, Emergent impairment    Extremity Assessment (includes Sensation/Coordination)  Upper Extremity Assessment: RUE deficits/detail, LUE deficits/detail RUE Deficits / Details: trace activation at shoulder, elbow flexors, and finger flexors noted all <2/5, no activation of tricep extensors or wrist flex/ext noted. elbow wrist and digit ROM WFL passively, shoulder flexion limited to ~110 degrees PROM LUE Deficits / Details: grossly 4/5 other than 4-/5 grip strength, slowed fine motor  Lower Extremity Assessment: LLE  deficits/detail, RLE deficits/detail RLE Deficits / Details: R knee with ~15 degree knee flexion contracture, strength grossly 3+/5 through available range. Increased adductor tone and knee flexion tone LLE Deficits / Details: LLE grossly 4-/5, knee extension to ~-5 degrees passively.    ADLs       Mobility  Overal bed mobility: Needs Assistance Bed Mobility: Supine to Sit, Sit to Supine Supine to sit: Mod assist, HOB elevated Sit to supine: Mod assist, HOB elevated General bed mobility comments: cues to assist RLE with LLE, hand hold to pull into sitting with use of LUE    Transfers  Overall transfer level: Needs assistance Equipment used: 1 person hand held assist Transfers: Sit to/from Stand Sit to Stand: Max assist, From elevated surface General transfer comment: R knee block, increased adductor tone noted in standing    Ambulation / Gait / Stairs / Wheelchair Mobility       Posture / Balance Balance Overall balance assessment: Needs assistance Sitting-balance support: No upper extremity supported, Feet supported Sitting balance-Leahy Scale: Fair Standing balance support: Single extremity supported, Reliant on assistive device for balance Standing balance-Leahy Scale: Poor    Special needs/care consideration Continuous Drip IV  azithromycin, rocephin, .9% sodium chloride External urinary catheter and Skin Abrasion: elbow/right   Previous Home Environment (from acute therapy documentation) Living Arrangements: Other relatives (sister)  Lives With: Other (Comment) (sister) Available Help at Discharge: Family, Available 24 hours/day Type of Home: House Home Layout: One level Home Access: Ramped entrance Bathroom Shower/Tub: Chiropodist: Standard Bathroom Accessibility: Yes How Accessible: Accessible via wheelchair, Accessible via walker Sharon: No  Discharge Living Setting Plans for Discharge Living Setting: Patient's home Type of Home  at Discharge: House Discharge Home Layout: One level Discharge Home Access: [place] entrance Discharge Bathroom Shower/Tub: [place] unit Discharge Bathroom Toilet: Standard Discharge Bathroom Accessibility: Yes How Accessible: Accessible via wheelchair, Accessible via walker Does the patient have any problems obtaining your medications?: No  Social/Family/Support Systems Anticipated Caregiver: [name], sister Anticipated Caregiver's Contact Information: [phone] Caregiver Availability: 24/7 Discharge Plan Discussed with Primary Caregiver: Yes Is Caregiver In Agreement with Plan?: Yes Does Caregiver/Family have Issues with Lodging/Transportation while Pt is in Rehab?: No  Goals Patient/Family Goal for Rehab: PT/OT/SLP Supervision  Expected length of stay: 12-14 days  Pt/Family Agrees to Admission and willing to participate: Yes Program Orientation Provided & Reviewed with Pt/Caregiver Including Roles  & Responsibilities: Yes  Decrease burden of Care through IP rehab admission: NA  Possible need for SNF placement upon discharge: Not anticipated  Patient Condition: I have reviewed medical records from [facility], spoken with CM, and patient and family member. I met with patient at the bedside and discussed via phone for inpatient rehabilitation assessment.  Patient will benefit from ongoing PT, OT, and SLP, can actively participate in 3 hours of therapy a day  5 days of the week, and can make measurable gains during the admission.  Patient will also benefit from the coordinated team approach during an Inpatient Acute Rehabilitation admission.  The patient will receive intensive therapy as well as Rehabilitation physician, nursing, social worker, and care management interventions.  Due to safety, skin/wound care, disease management, medication administration, pain management, and patient education the patient requires 24 hour a day rehabilitation nursing.  The patient is  currently mod A with mobility and basic ADLs.  Discharge setting and therapy post discharge at home with home health is anticipated.  Patient has agreed to participate in the Acute Inpatient Rehabilitation Program and will admit today.  Preadmission Screen Completed By:  Bethel Born, 10/22/2022 11:59 AM ______________________________________________________________________   Discussed status with Dr. Dagoberto Ligas  on 930 at 10/27/22 and received approval for admission today.  Admission Coordinator:  Bethel Born, [place], time 1523/Date 10/27/22   Assessment/Plan: Diagnosis: Does the need for close, 24 hr/day Medical supervision in concert with the patient's rehab needs make it unreasonable for this patient to be served in a less intensive setting? Yes Co-Morbidities requiring supervision/potential complications: Schizophrenia, prior CVA with R hemi and new R hemiplegia due to L MCA stroke; B/L SDH's, ARF- improving- s/p UTI and pneumonia; BPH, HTN, CKD 4 at baseline Due to bladder management, bowel management, safety, skin/wound care, disease management, medication administration, pain management, and patient education, does the patient require 24 hr/day rehab nursing? Yes Does the patient require coordinated care of a physician, rehab nurse, PT, OT, and SLP to address physical and functional deficits in the context of the above medical diagnosis(es)? Yes Addressing deficits in the following areas: balance, endurance, locomotion, strength, transferring, bowel/bladder control, bathing, dressing, feeding, grooming, toileting, cognition, speech, language, and swallowing Can the patient actively participate in an intensive therapy program of at least 3 hrs of therapy 5 days a week? Yes The potential for patient to make measurable gains while on inpatient rehab is good and fair Anticipated functional outcomes upon discharge from inpatient rehab: supervision PT, supervision OT, supervision  SLP Estimated rehab length of stay to reach the above functional goals is: 14-18 days Anticipated discharge destination: Home 10. Overall Rehab/Functional Prognosis: good and fair   MD Signature:

## 2022-10-23 ENCOUNTER — Inpatient Hospital Stay (HOSPITAL_COMMUNITY): Payer: Medicare PPO

## 2022-10-23 DIAGNOSIS — S065XAA Traumatic subdural hemorrhage with loss of consciousness status unknown, initial encounter: Secondary | ICD-10-CM | POA: Diagnosis not present

## 2022-10-23 DIAGNOSIS — I6522 Occlusion and stenosis of left carotid artery: Secondary | ICD-10-CM | POA: Diagnosis not present

## 2022-10-23 DIAGNOSIS — I639 Cerebral infarction, unspecified: Secondary | ICD-10-CM | POA: Diagnosis not present

## 2022-10-23 LAB — RENAL FUNCTION PANEL
Albumin: 2.6 g/dL — ABNORMAL LOW (ref 3.5–5.0)
Anion gap: 7 (ref 5–15)
BUN: 38 mg/dL — ABNORMAL HIGH (ref 8–23)
CO2: 22 mmol/L (ref 22–32)
Calcium: 6.6 mg/dL — ABNORMAL LOW (ref 8.9–10.3)
Chloride: 112 mmol/L — ABNORMAL HIGH (ref 98–111)
Creatinine, Ser: 4.72 mg/dL — ABNORMAL HIGH (ref 0.61–1.24)
GFR, Estimated: 12 mL/min — ABNORMAL LOW (ref >60)
Glucose, Bld: 93 mg/dL (ref 70–99)
Phosphorus: 1.3 mg/dL — ABNORMAL LOW (ref 2.5–4.6)
Potassium: 4.4 mmol/L (ref 3.5–5.1)
Sodium: 141 mmol/L (ref 135–145)

## 2022-10-23 LAB — IRON AND TIBC
Iron: 18 ug/dL — ABNORMAL LOW (ref 45–182)
Saturation Ratios: 10 % — ABNORMAL LOW (ref 17.9–39.5)
TIBC: 189 ug/dL — ABNORMAL LOW (ref 250–450)
UIBC: 171 ug/dL

## 2022-10-23 LAB — CBC
HCT: 24.3 % — ABNORMAL LOW (ref 39.0–52.0)
Hemoglobin: 8.5 g/dL — ABNORMAL LOW (ref 13.0–17.0)
MCH: 29.8 pg (ref 26.0–34.0)
MCHC: 35 g/dL (ref 30.0–36.0)
MCV: 85.3 fL (ref 80.0–100.0)
Platelets: 230 10*3/uL (ref 150–400)
RBC: 2.85 MIL/uL — ABNORMAL LOW (ref 4.22–5.81)
RDW: 13.2 % (ref 11.5–15.5)
WBC: 5.6 10*3/uL (ref 4.0–10.5)
nRBC: 0 % (ref 0.0–0.2)

## 2022-10-23 LAB — RETICULOCYTES
Immature Retic Fract: 12.2 % (ref 2.3–15.9)
RBC.: 2.83 MIL/uL — ABNORMAL LOW (ref 4.22–5.81)
Retic Count, Absolute: 23.5 10*3/uL (ref 19.0–186.0)
Retic Ct Pct: 0.8 % (ref 0.4–3.1)

## 2022-10-23 LAB — FOLATE: Folate: 8.6 ng/mL (ref >5.9)

## 2022-10-23 LAB — VITAMIN B12: Vitamin B-12: 375 pg/mL (ref 180–914)

## 2022-10-23 LAB — FERRITIN: Ferritin: 94 ng/mL (ref 24–336)

## 2022-10-23 LAB — MAGNESIUM: Magnesium: 1.6 mg/dL — ABNORMAL LOW (ref 1.7–2.4)

## 2022-10-23 MED ORDER — MAGNESIUM SULFATE 2 GM/50ML IV SOLN
2.0000 g | Freq: Once | INTRAVENOUS | Status: AC
  Administered 2022-10-23: 2 g via INTRAVENOUS
  Filled 2022-10-23: qty 50

## 2022-10-23 MED ORDER — SODIUM CHLORIDE 0.9 % IV SOLN: 2.0000 g | Freq: Every day | INTRAVENOUS | Status: DC

## 2022-10-23 MED ORDER — SODIUM CHLORIDE 0.9 % IV SOLN
500.0000 mg | INTRAVENOUS | Status: DC
  Administered 2022-10-23 – 2022-10-27 (×5): 500 mg via INTRAVENOUS
  Filled 2022-10-23 (×5): qty 5

## 2022-10-23 MED ORDER — SODIUM CHLORIDE 0.9 % IV SOLN
2.0000 g | Freq: Every day | INTRAVENOUS | Status: DC
  Administered 2022-10-24 – 2022-10-27 (×4): 2 g via INTRAVENOUS
  Filled 2022-10-23 (×4): qty 20

## 2022-10-23 MED ORDER — AMLODIPINE BESYLATE 2.5 MG PO TABS
2.5000 mg | ORAL_TABLET | Freq: Every day | ORAL | Status: DC
  Administered 2022-10-23: 2.5 mg via ORAL
  Filled 2022-10-23: qty 1

## 2022-10-23 MED ORDER — SODIUM CHLORIDE 0.9 % IV SOLN: INTRAVENOUS | Status: DC

## 2022-10-23 MED ORDER — ASPIRIN 81 MG PO TBEC
81.0000 mg | DELAYED_RELEASE_TABLET | Freq: Every day | ORAL | Status: DC
  Administered 2022-10-23 – 2022-10-27 (×5): 81 mg via ORAL
  Filled 2022-10-23 (×5): qty 1

## 2022-10-23 MED ORDER — SODIUM CHLORIDE 0.9 % IV SOLN
1.0000 g | Freq: Every day | INTRAVENOUS | Status: DC
  Administered 2022-10-23: 1 g via INTRAVENOUS
  Filled 2022-10-23: qty 10

## 2022-10-23 MED ORDER — MAGNESIUM OXIDE -MG SUPPLEMENT 400 (240 MG) MG PO TABS
200.0000 mg | ORAL_TABLET | Freq: Two times a day (BID) | ORAL | Status: DC
  Administered 2022-10-23 – 2022-10-27 (×9): 200 mg via ORAL
  Filled 2022-10-23 (×9): qty 1

## 2022-10-23 MED ORDER — K PHOS MONO-SOD PHOS DI & MONO 155-852-130 MG PO TABS
250.0000 mg | ORAL_TABLET | Freq: Two times a day (BID) | ORAL | Status: AC
  Administered 2022-10-23 – 2022-10-24 (×3): 250 mg via ORAL
  Filled 2022-10-23 (×3): qty 1

## 2022-10-23 MED ORDER — SODIUM CHLORIDE 0.9 % IV SOLN
1.0000 g | Freq: Once | INTRAVENOUS | Status: AC
  Administered 2022-10-23: 1 g via INTRAVENOUS
  Filled 2022-10-23: qty 10

## 2022-10-23 NOTE — Progress Notes (Addendum)
PROGRESS NOTE    Daniel [name]  NLZ:[id number] DOB: [birth year] DOA: 10/20/2022 PCP: Vivi Barrack, MD   Brief Narrative: 76 year old with past medical history significant for hypertension, hyperlipidemia, stroke with residual right hemiparesis, CKD stage IIIb-4, schizophrenia, BPH presented to ED via EMS with right-sided facial droop, slurred speech.LKW 3 days ago.  Potassium 2.9, bicarb 19, anion gap 13 creatinine 5.8 (baseline 2.0-2.3 in 2021).  MRI of brain showing large area of acute ischemia within the anterior left MCA territory.  Bilateral subdural hematoma, right greater than left.     Assessment & Plan:   Principal Problem:   Acute CVA (cerebrovascular accident) ([place]) Active Problems:   HTN (hypertension)   Hyperlipidemia   Schizophrenia ([place])   Acute kidney injury superimposed on chronic kidney disease (HCC)   Subdural hematoma ([facility])   Metabolic acidosis   Hypokalemia   Hypocalcemia   BPH (benign prostatic hyperplasia)  1-Acute Ischemic Stroke:  MRI brain: Large area of acute ischemia within the anterior left MCA territory.  No hemorrhage or mass effect.  Bilateral subdural hematoma right greater than left. MR Angio: Occlusion of the left ICA proximal to the skull base.  Diminished flow related enhancement in the left MCA.  ECHO: EF 65 %.  Grade 1 diastolic dysfunction. Carotid doppler: Right Carotid: Velocities in the right ICA are consistent with a 40-59% stenosis. The ECA appears >50% stenosed. Left Carotid: Evidence consistent with a total occlusion of the left ICA.  The ECA appears >50% stenosed. Needs to follow up with vascular out patient per neurology  LDL; 65, A1c: 5.0. CT head; subdural stable. Neurology considering initiation of aspirin.   Bilateral subdural hematoma:  Neurosurgery Dr Marcello Moores consulted.  No neurosurgical intervention or follow-up required.  Anticoagulation will be relatively contraindicated in the setting. CT head stable on 11/17.    AKI on CKD stage IIIb-IV:  Metabolic acidosis in setting renal failure.  In setting hemodynamic, hypovolemia.  -Started oral Bicarb Received Bicarb Gtt, now on NS>  Renal US, negative for hydronephrosis.  Nephrology consulted. Appreciate assistance.  Cr trending down.   PNA, UTI;  Chest x ray: Atelectasis versus infiltrate LEFT base. Mild RIGHT upper lobe infiltrate. UA with more than 50 WBC Check urine culture.  Started IV ceftriaxone and Azithromycin,   Hypokalemia: Supplement ordered.   Hypocalcemia:  Started supplement.    Vitamin D deficiency. Started supplement.   HTN;  Permissive HTN Hold lisinopril in setting AKI.  BP goal 130-160 per neurology   Hyperlipidemia:  Started lipitor.   Schizophrenia Not on meds.   BPH On flomax.   Anemia:  Prior hb 11--8 Suspect hemodilution anemia of chronic diseases.  Anemia panel. Will need IV iron when infection controlled.          Estimated body mass index is 21.52 kg/m as calculated from the following:   Height as of this encounter: 5\' 9"  (1.753 m).   Weight as of this encounter: 66.1 kg.   DVT prophylaxis: scd Code Status: Full code Family Communication:  Disposition Plan:  Status is: Inpatient Remains inpatient appropriate because: inpatient for stroke, subdural. AKI. CIR>     Consultants:  Neurology Neurosurgery Nephrology   Procedures:  ECHO  Antimicrobials:    Subjective: Neuro deficit stable. Dysarthria . Right side weakness.  He had low grade fever. Report some cough and dysuria.   Objective: Vitals:   10/23/22 0320 10/23/22 0751 10/23/22 1129 10/23/22 1133  BP: (!) 167/91 (!) 172/85 (!) 188/90 (!) 172/80  Pulse: 80 85 99 90  Resp: 17 17 17    Temp: (!) 100.5 F (38.1 C) 98.1 F (36.7 C) (!) 100.6 F (38.1 C) 99.5 F (37.5 C)  TempSrc: Oral Oral Oral Oral  SpO2: 98% 100% 99%   Weight:      Height:        Intake/Output Summary (Last 24 hours) at 10/23/2022 1222 Last  data filed at 10/23/2022 0800 Gross per 24 hour  Intake 2714.34 ml  Output 1600 ml  Net 1114.34 ml    Filed Weights   10/20/22 1347 10/21/22 2055  Weight: 86.2 kg 66.1 kg    Examination:  General exam: NAD Respiratory system: CTA Cardiovascular system: S 1, S 2 RRR Gastrointestinal system: BS present, soft, nt Central nervous system: Alert, neuro deficit stable. . Right side weakness, right arm 1/5  dysarthric speech. LE 4/5 Extremities: Right side weakness, right arm 1/5  dysarthric speech. LE 4/5    Data Reviewed: I have personally reviewed following labs and imaging studies  CBC: Recent Labs  Lab 10/20/22 1453 10/20/22 1501 10/21/22 0435 10/22/22 0427 10/23/22 0318  WBC 6.1  --  7.0 7.0 5.6  NEUTROABS 4.8  --   --   --   --   HGB 11.8* 12.6* 10.0* 8.6* 8.5*  HCT 36.8* 37.0* 29.4* 25.0* 24.3*  MCV 90.0  --  89.6 86.8 85.3  PLT 249  --  212 210 829    Basic Metabolic Panel: Recent Labs  Lab 10/20/22 1453 10/20/22 1501 10/21/22 0435 10/21/22 1456 10/22/22 0427 10/23/22 0324  NA 137 140 139  --  138 141  K 2.9* 2.9* 3.9  --  3.0* 4.4  CL 105 106 111  --  111 112*  CO2 19*  --  12*  --  19* 22  GLUCOSE 108* 107* 93  --  104* 93  BUN 41* 39* 43*  --  40* 38*  CREATININE 5.85* 6.40* 5.63*  --  5.16* 4.72*  CALCIUM 7.1*  --  7.1*  --  6.4* 6.6*  MG  --   --   --  1.5*  --  1.6*  PHOS  --   --   --   --   --  1.3*    GFR: Estimated Creatinine Clearance: 12.4 mL/min (A) (by C-G formula based on SCr of 4.72 mg/dL (H)). Liver Function Tests: Recent Labs  Lab 10/20/22 1453 10/22/22 0427 10/23/22 0324  AST 26  --   --   ALT 19  --   --   ALKPHOS 81  --   --   BILITOT 0.5  --   --   PROT 7.2  --   --   ALBUMIN 3.7 2.7* 2.6*    No results for input(s): "LIPASE", "AMYLASE" in the last 168 hours. No results for input(s): "AMMONIA" in the last 168 hours. Coagulation Profile: Recent Labs  Lab 10/20/22 1453  INR 1.1    Cardiac Enzymes: No results  for input(s): "CKTOTAL", "CKMB", "CKMBINDEX", "TROPONINI" in the last 168 hours. BNP (last 3 results) No results for input(s): "PROBNP" in the last 8760 hours. HbA1C: Recent Labs    10/20/22 1453 10/21/22 0625  HGBA1C 5.5 5.5    CBG: No results for input(s): "GLUCAP" in the last 168 hours. Lipid Profile: Recent Labs    10/21/22 0435  CHOL 132  HDL 46  LDLCALC 65  TRIG 107  CHOLHDL 2.9    Thyroid Function Tests: No results for input(s): "  TSH", "T4TOTAL", "FREET4", "T3FREE", "THYROIDAB" in the last 72 hours. Anemia Panel: Recent Labs    10/23/22 0324  VITAMINB12 375  FOLATE 8.6  FERRITIN 94  TIBC 189*  IRON 18*  RETICCTPCT 0.8   Sepsis Labs: No results for input(s): "PROCALCITON", "LATICACIDVEN" in the last 168 hours.  No results found for this or any previous visit (from the past 240 hour(s)).       Radiology Studies: DG CHEST PORT 1 VIEW  Result Date: 10/23/2022 CLINICAL DATA:  Fever, recovering from stroke EXAM: PORTABLE CHEST 1 VIEW COMPARISON:  Portable exam 0939 hours compared to 12/11/2019 FINDINGS: Enlargement of cardiac silhouette. Mediastinal contours and pulmonary vascularity normal. Hazy RIGHT upper lobe infiltrate near minor fissure. Mild atelectasis versus infiltrate LEFT lung base with central peribronchial thickening again seen. Remaining lungs clear without pleural effusion or pneumothorax. Bones demineralized. IMPRESSION: Atelectasis versus infiltrate LEFT base. Mild RIGHT upper lobe infiltrate. Electronically Signed   By: Lavonia Dana M.D.   On: 10/23/2022 11:16   CT HEAD WO CONTRAST (5MM)  Result Date: 10/23/2022 CLINICAL DATA:  Stroke follow-up.  Weakness EXAM: CT HEAD WITHOUT CONTRAST TECHNIQUE: Contiguous axial images were obtained from the base of the skull through the vertex without intravenous contrast. RADIATION DOSE REDUCTION: This exam was performed according to the departmental dose-optimization program which includes automated  exposure control, adjustment of the mA and/or kV according to patient size and/or use of iterative reconstruction technique. COMPARISON:  Brain MRI from 3 days ago FINDINGS: Brain: No noted progression of the left MCA branch infarct centered at the frontal lobe. Pre-existing left cerebral cortex infarcts. Thin subdural hematoma along the posterior falx and right tentorium. Lateral to the right cerebellar hemisphere subdural hemorrhage measures up to 2 mm in thickness, non progressed. The extent of subdural hemorrhage is underestimated compared to prior MRI. Stable low-density in the right cerebral white matter attributed to chronic small vessel ischemia. Vascular: No hyperdense vessel or unexpected calcification. Skull: Normal. Negative for fracture or focal lesion. Sinuses/Orbits: No acute finding IMPRESSION: 1. No progression or hemorrhagic conversion at the left MCA branch infarct. Remote left ACA/MCA infarction. 2. Unchanged thin subdural hematoma without mass effect. Electronically Signed   By: Jorje Guild M.D.   On: 10/23/2022 07:47        Scheduled Meds:   stroke: early stages of recovery book   Does not apply Once   amLODipine  2.5 mg Oral Daily   atorvastatin  40 mg Oral Daily   calcium carbonate  1 tablet Oral BID WC   folic acid  1 mg Oral Daily   heparin injection (subcutaneous)  5,000 Units Subcutaneous Q8H   magnesium oxide  200 mg Oral BID   nystatin  5 mL Oral QID   phosphorus  250 mg Oral BID WC   sodium bicarbonate  650 mg Oral TID   tamsulosin  0.4 mg Oral Daily   Vitamin D (Ergocalciferol)  50,000 Units Oral Q7 days   Continuous Infusions:  azithromycin     [START ON 10/24/2022] cefTRIAXone (ROCEPHIN)  IV     magnesium sulfate bolus IVPB 2 g (10/23/22 1134)     LOS: 3 days    Time spent: 35 minutes    Graesyn Schreifels A Jamani Eley, MD [facility]   If 7PM-7AM, please contact night-coverage [email or web address]  10/23/2022, 12:22 PM

## 2022-10-23 NOTE — Care Management Important Message (Signed)
Important Message  Patient Details  Name: Daniel [name] MRN: [id number] Date of Birth: 04-11-1946   Medicare Important Message Given:  Yes     Tremell Reimers [address] 10/23/2022, 1:55 PM

## 2022-10-23 NOTE — Progress Notes (Addendum)
[facility] NEPHROLOGY PROGRESS NOTE  Assessment/ Plan: Pt is a 76 y.o. yo male  with past medical history significant for hypertension, HLD, BPH, prior stroke with residual right hemiparesis, CKD 3B, presented with right sided facial droop and slurred speech, seen as a consultation for the evaluation of acute kidney injury on CKD.   #Acute kidney injury on CKD stage IIIb/IV: cr around 2.2-2.7 in 2021, no recent baseline creatinine level available.  Kidney ultrasound with chronic finding and volume loss.  This is likely acute kidney injury due to hemodynamically mediated in the setting of decreased oral intake, use of ACE inhibitor and UTI.  With IV fluid the serum creatinine level is trending down.  He is nonoliguric.  This morning he was not getting IV fluid however, I will switch to NS. Daily lab and strict ins and out.  Expect gradual renal recovery.  No need indication for dialysis.   #Acute left MCA ischemic stroke/acute bilateral SDH: No intervention per neurosurgeon.  Neurology is following.  Per NS, only aspirin for anticoagulation.   #Hypertension: Need permissive hypertension because of acute stroke.  Resume antihypertensives gradually, monitor BP.   #Metabolic acidosis due to renal failure: Improved, on oral sodium bicarbonate.#History of BPH: On Flomax.  US renal without hydronephrosis.   #Hypokalemia: Replete potassium chloride.  Monitor lab.  #Hypomagnesemia: Continue to replete magnesium.  #Hypocalcemia/hypophosphatemia: Continue vitamin D and replenishing calcium and started sodium phosphate.  Pending PTH level.  # Anemia due to CKD: Drop in hemoglobin noted probably dilutional, iron saturation 10%.  Given temperature of 100.5 this morning, I will hold IV iron.   Subjective: Seen and examined.  No new event.  He was alert awake and able to lie comfortable.  Urine output is recorded only 1 L.  When I saw him at the bedside he was not receiving IV  fluid. Objective Vital signs in last 24 hours: Vitals:   10/22/22 1935 10/22/22 2317 10/23/22 0320 10/23/22 0751  BP: (!) 163/78 (!) 167/80 (!) 167/91 (!) 172/85  Pulse: 78 75 80 85  Resp: 17 16 17 17   Temp: 99.9 F (37.7 C) 100.2 F (37.9 C) (!) 100.5 F (38.1 C) 98.1 F (36.7 C)  TempSrc: Oral Oral Oral Oral  SpO2: 100% 100% 98% 100%  Weight:      Height:       Weight change:   Intake/Output Summary (Last 24 hours) at 10/23/2022 1638 Last data filed at 10/23/2022 0800 Gross per 24 hour  Intake 2714.34 ml  Output 1600 ml  Net 1114.34 ml        Labs: RENAL PANEL Recent Labs    10/20/22 1453 10/20/22 1501 10/21/22 0435 10/21/22 1456 10/22/22 0427 10/23/22 0324  NA 137 140 139  --  138 141  K 2.9* 2.9* 3.9  --  3.0* 4.4  CL 105 106 111  --  111 112*  CO2 19*  --  12*  --  19* 22  GLUCOSE 108* 107* 93  --  104* 93  BUN 41* 39* 43*  --  40* 38*  CREATININE 5.85* 6.40* 5.63*  --  5.16* 4.72*  CALCIUM 7.1*  --  7.1*  --  6.4* 6.6*  MG  --   --   --  1.5*  --  1.6*  PHOS  --   --   --   --   --  1.3*  ALBUMIN 3.7  --   --   --  2.7* 2.6*  Liver Function Tests: Recent Labs  Lab 10/20/22 1453 10/22/22 0427 10/23/22 0324  AST 26  --   --   ALT 19  --   --   ALKPHOS 81  --   --   BILITOT 0.5  --   --   PROT 7.2  --   --   ALBUMIN 3.7 2.7* 2.6*    No results for input(s): "LIPASE", "AMYLASE" in the last 168 hours. No results for input(s): "AMMONIA" in the last 168 hours. CBC: Recent Labs    10/20/22 1453 10/20/22 1501 10/21/22 0435 10/22/22 0427 10/23/22 0318 10/23/22 0324  HGB 11.8* 12.6* 10.0* 8.6* 8.5*  --   MCV 90.0  --  89.6 86.8 85.3  --   VITAMINB12  --   --   --   --   --  375  FOLATE  --   --   --   --   --  8.6  FERRITIN  --   --   --   --   --  94  TIBC  --   --   --   --   --  189*  IRON  --   --   --   --   --  18*  RETICCTPCT  --   --   --   --   --  0.8     Cardiac Enzymes: No results for input(s): "CKTOTAL", "CKMB",  "CKMBINDEX", "TROPONINI" in the last 168 hours. CBG: No results for input(s): "GLUCAP" in the last 168 hours.  Iron Studies:  Recent Labs    10/23/22 0324  IRON 18*  TIBC 189*  FERRITIN 94   Studies/Results: CT HEAD WO CONTRAST (5MM)  Result Date: 10/23/2022 CLINICAL DATA:  Stroke follow-up.  Weakness EXAM: CT HEAD WITHOUT CONTRAST TECHNIQUE: Contiguous axial images were obtained from the base of the skull through the vertex without intravenous contrast. RADIATION DOSE REDUCTION: This exam was performed according to the departmental dose-optimization program which includes automated exposure control, adjustment of the mA and/or kV according to patient size and/or use of iterative reconstruction technique. COMPARISON:  Brain MRI from 3 days ago FINDINGS: Brain: No noted progression of the left MCA branch infarct centered at the frontal lobe. Pre-existing left cerebral cortex infarcts. Thin subdural hematoma along the posterior falx and right tentorium. Lateral to the right cerebellar hemisphere subdural hemorrhage measures up to 2 mm in thickness, non progressed. The extent of subdural hemorrhage is underestimated compared to prior MRI. Stable low-density in the right cerebral white matter attributed to chronic small vessel ischemia. Vascular: No hyperdense vessel or unexpected calcification. Skull: Normal. Negative for fracture or focal lesion. Sinuses/Orbits: No acute finding IMPRESSION: 1. No progression or hemorrhagic conversion at the left MCA branch infarct. Remote left ACA/MCA infarction. 2. Unchanged thin subdural hematoma without mass effect. Electronically Signed   By: Jorje Guild M.D.   On: 10/23/2022 07:47   VAS US CAROTID (at [facility] and [facility] only)  Result Date: 10/22/2022 Carotid Arterial Duplex Study Patient Name:  Daniel [name]  Date of Exam:   10/21/2022 Medical Rec #: [id number]       Accession #:    [id number] Date of Birth: Sep 14, 1946      Patient Gender: M Patient Age:   37  years Exam Location:  [facility] Procedure:      VAS US CAROTID Referring Phys: Wandra Feinstein RATHORE --------------------------------------------------------------------------------  Indications:       CVA. Risk Factors:  Hypertension, hyperlipidemia, past history of smoking, prior                    CVA, PAD. Comparison Study:  Left ICA occlusion noted on MRA head 10-20-2022 Performing Technologist: Darlin Coco RDMS, RVT  Examination Guidelines: A complete evaluation includes B-mode imaging, spectral Doppler, color Doppler, and power Doppler as needed of all accessible portions of each vessel. Bilateral testing is considered an integral part of a complete examination. Limited examinations for reoccurring indications may be performed as noted.  Right Carotid Findings: +----------+--------+--------+--------+-------------------------+--------+           PSV cm/sEDV cm/sStenosisPlaque Description       Comments +----------+--------+--------+--------+-------------------------+--------+ CCA Prox  100     15                                                +----------+--------+--------+--------+-------------------------+--------+ CCA Distal81      23                                                +----------+--------+--------+--------+-------------------------+--------+ ICA Prox  193     40      40-59%  calcific and heterogenous         +----------+--------+--------+--------+-------------------------+--------+ ICA Mid   86      26                                                +----------+--------+--------+--------+-------------------------+--------+ ICA Distal51      22                                                +----------+--------+--------+--------+-------------------------+--------+ ECA       272     28      >50%                                      +----------+--------+--------+--------+-------------------------+--------+  +----------+--------+-------+---------+-------------------+           PSV cm/sEDV cmsDescribe Arm Pressure (mmHG) +----------+--------+-------+---------+-------------------+ [id number]            Turbulent                    +----------+--------+-------+---------+-------------------+ +---------+--------+---+--------+--+---------+ VertebralPSV cm/s100EDV cm/s16Antegrade +---------+--------+---+--------+--+---------+  Left Carotid Findings: +----------+--------+--------+--------+---------------------------+--------+           PSV cm/sEDV cm/sStenosisPlaque Description         Comments +----------+--------+--------+--------+---------------------------+--------+ CCA Prox  125     18                                                  +----------+--------+--------+--------+---------------------------+--------+ CCA Distal75      15                                                  +----------+--------+--------+--------+---------------------------+--------+  ICA Prox                  Occludedhypoechoic and heterogenous         +----------+--------+--------+--------+---------------------------+--------+ ICA Mid                   Occludedhypoechoic and heterogenous         +----------+--------+--------+--------+---------------------------+--------+ ICA Distal                Occludedhypoechoic and heterogenous         +----------+--------+--------+--------+---------------------------+--------+ ECA       318     47      >50%                                        +----------+--------+--------+--------+---------------------------+--------+ +----------+--------+--------+----------------+-------------------+           PSV cm/sEDV cm/sDescribe        Arm Pressure (mmHG) +----------+--------+--------+----------------+-------------------+ [id number]             Multiphasic, WNL                     +----------+--------+--------+----------------+-------------------+ +---------+--------+---+--------+--+---------+ VertebralPSV cm/s144EDV cm/s36Antegrade +---------+--------+---+--------+--+---------+   Summary: Right Carotid: Velocities in the right ICA are consistent with a 40-59%                stenosis. The ECA appears >50% stenosed. Left Carotid: Evidence consistent with a total occlusion of the left ICA. The               ECA appears >50% stenosed. Vertebrals:  Bilateral vertebral arteries demonstrate antegrade flow. Subclavians: Right subclavian artery flow was disturbed. Normal flow              hemodynamics were seen in the left subclavian artery. *See table(s) above for measurements and observations.  Electronically signed by Antony Contras MD on 10/22/2022 at 8:25:16 AM.    Final    ECHOCARDIOGRAM COMPLETE  Result Date: 10/21/2022    ECHOCARDIOGRAM REPORT   Patient Name:   Daniel [name] Date of Exam: 10/21/2022 Medical Rec #:  [id number]      Height:       69.0 in Accession #:    [id number]     Weight:       190.0 lb Date of Birth:  October 28, 1946     BSA:          2.021 m Patient Age:    37 years       BP:           126/71 mmHg Patient Gender: M              HR:           63 bpm. Exam Location:  Inpatient Procedure: Color Doppler and Cardiac Doppler Indications:    stroke  History:        Patient has no prior history of Echocardiogram examinations.                 Risk Factors:Hypertension and Dyslipidemia.  Sonographer:    Melissa Morford RDCS (AE, PE) Referring Phys: [id number] VASUNDHRA [place]  1. Left ventricular ejection fraction, by estimation, is 60 to 65%. The left ventricle has normal function. The left ventricle has no regional wall motion abnormalities. There is mild left ventricular hypertrophy. Left ventricular diastolic parameters are consistent with Grade I diastolic dysfunction (impaired relaxation).  2. Right ventricular systolic function is normal. The right  ventricular size is normal.  3. Left atrial size was mild to moderately dilated.  4. Right atrial size was mildly dilated.  5. The mitral valve is normal in structure. No evidence of mitral valve regurgitation. No evidence of mitral stenosis.  6. The aortic valve is tricuspid. Aortic valve regurgitation is not visualized. No aortic stenosis is present.  7. The inferior vena cava is normal in size with greater than 50% respiratory variability, suggesting right atrial pressure of 3 mmHg. Conclusion(s)/Recommendation(s): No intracardiac source of embolism detected on this transthoracic study. Consider a transesophageal echocardiogram to exclude cardiac source of embolism if clinically indicated. FINDINGS  Left Ventricle: Left ventricular ejection fraction, by estimation, is 60 to 65%. The left ventricle has normal function. The left ventricle has no regional wall motion abnormalities. The left ventricular internal cavity size was normal in size. There is  mild left ventricular hypertrophy. Left ventricular diastolic parameters are consistent with Grade I diastolic dysfunction (impaired relaxation). Right Ventricle: The right ventricular size is normal. No increase in right ventricular wall thickness. Right ventricular systolic function is normal. Left Atrium: Left atrial size was mild to moderately dilated. Right Atrium: Right atrial size was mildly dilated. Pericardium: There is no evidence of pericardial effusion. Mitral Valve: The mitral valve is normal in structure. No evidence of mitral valve regurgitation. No evidence of mitral valve stenosis. Tricuspid Valve: The tricuspid valve is normal in structure. Tricuspid valve regurgitation is trivial. No evidence of tricuspid stenosis. Aortic Valve: The aortic valve is tricuspid. Aortic valve regurgitation is not visualized. No aortic stenosis is present. Pulmonic Valve: The pulmonic valve was normal in structure. Pulmonic valve regurgitation is not visualized. No  evidence of pulmonic stenosis. Aorta: The aortic root is normal in size and structure. Venous: The inferior vena cava is normal in size with greater than 50% respiratory variability, suggesting right atrial pressure of 3 mmHg. IAS/Shunts: No atrial level shunt detected by color flow Doppler.  LEFT VENTRICLE PLAX 2D LVIDd:         4.90 cm      Diastology LVIDs:         3.50 cm      LV e' medial:    5.00 cm/s LV PW:         1.20 cm      LV E/e' medial:  16.0 LV IVS:        1.20 cm      LV e' lateral:   5.00 cm/s LVOT diam:     2.10 cm      LV E/e' lateral: 16.0 LV SV:         70 LV SV Index:   35 LVOT Area:     3.46 cm  LV Volumes (MOD) LV vol d, MOD A2C: 64.2 ml LV vol d, MOD A4C: 141.0 ml LV vol s, MOD A2C: 42.9 ml LV vol s, MOD A4C: 70.4 ml LV SV MOD A2C:     21.3 ml LV SV MOD A4C:     141.0 ml LV SV MOD BP:      45.5 ml RIGHT VENTRICLE RV S prime:     15.30 cm/s TAPSE (M-mode): 2.5 cm LEFT ATRIUM              Index        RIGHT ATRIUM           Index LA diam:  3.10 cm  1.53 cm/m   RA Area:     21.60 cm LA Vol (A2C):   52.3 ml  25.87 ml/m  RA Volume:   67.60 ml  33.44 ml/m LA Vol (A4C):   106.0 ml 52.44 ml/m LA Biplane Vol: 76.1 ml  37.65 ml/m  AORTIC VALVE LVOT Vmax:   95.10 cm/s LVOT Vmean:  70.500 cm/s LVOT VTI:    0.202 m  AORTA Ao Root diam: 3.40 cm MITRAL VALVE                TRICUSPID VALVE MV Area (PHT): 4.12 cm     TR Peak grad:   10.9 mmHg MV Decel Time: 184 msec     TR Vmax:        165.00 cm/s MV E velocity: 80.20 cm/s MV A velocity: 108.00 cm/s  SHUNTS MV E/A ratio:  0.74         Systemic VTI:  0.20 m                             Systemic Diam: 2.10 cm Candee Furbish MD Electronically signed by Candee Furbish MD Signature Date/Time: 10/21/2022/11:12:02 AM    Final     Medications: Infusions:  cefTRIAXone (ROCEPHIN)  IV     magnesium sulfate bolus IVPB     sodium bicarbonate 75 mEq in sodium chloride 0.45 % 1,075 mL infusion 100 mL/hr at 10/23/22 4782    Scheduled Medications:   stroke:  early stages of recovery book   Does not apply Once   amLODipine  2.5 mg Oral Daily   atorvastatin  40 mg Oral Daily   calcium carbonate  1 tablet Oral BID WC   folic acid  1 mg Oral Daily   heparin injection (subcutaneous)  5,000 Units Subcutaneous Q8H   magnesium oxide  200 mg Oral BID   nystatin  5 mL Oral QID   phosphorus  250 mg Oral BID WC   sodium bicarbonate  650 mg Oral TID   tamsulosin  0.4 mg Oral Daily   Vitamin D (Ergocalciferol)  50,000 Units Oral Q7 days    have reviewed scheduled and prn medications.  Physical Exam: General:NAD, not in distress Heart:RRR, s1s2 nl Lungs:clear b/l, no crackle Abdomen:soft, Non-tender, non-distended Extremities:No edema Neurology: Alert, awake and oriented.  Anicia Leuthold Reesa Chew Merriam Brandner 10/23/2022,9:22 AM  LOS: 3 days

## 2022-10-23 NOTE — Progress Notes (Signed)
STROKE TEAM PROGRESS NOTE   INTERVAL HISTORY RN is at the bedside. He is lying in bed, awake alert, neuro stable still has right hemiparesis. Pending CIR insurance approval.   Vitals:   10/23/22 0320 10/23/22 0751 10/23/22 1129 10/23/22 1133  BP: (!) 167/91 (!) 172/85 (!) 188/90 (!) 172/80  Pulse: 80 85 99 90  Resp: 17 17 17    Temp: (!) 100.5 F (38.1 C) 98.1 F (36.7 C) (!) 100.6 F (38.1 C) 99.5 F (37.5 C)  TempSrc: Oral Oral Oral Oral  SpO2: 98% 100% 99%   Weight:      Height:       CBC:  Recent Labs  Lab 10/20/22 1453 10/20/22 1501 10/22/22 0427 10/23/22 0318  WBC 6.1   < > 7.0 5.6  NEUTROABS 4.8  --   --   --   HGB 11.8*   < > 8.6* 8.5*  HCT 36.8*   < > 25.0* 24.3*  MCV 90.0   < > 86.8 85.3  PLT 249   < > 210 230   < > = values in this interval not displayed.   Basic Metabolic Panel:  Recent Labs  Lab 10/21/22 1456 10/22/22 0427 10/23/22 0324  NA  --  138 141  K  --  3.0* 4.4  CL  --  111 112*  CO2  --  19* 22  GLUCOSE  --  104* 93  BUN  --  40* 38*  CREATININE  --  5.16* 4.72*  CALCIUM  --  6.4* 6.6*  MG 1.5*  --  1.6*  PHOS  --   --  1.3*   Lipid Panel:  Recent Labs  Lab 10/21/22 0435  CHOL 132  TRIG 107  HDL 46  CHOLHDL 2.9  VLDL 21  LDLCALC 65   HgbA1c:  Recent Labs  Lab 10/21/22 0625  HGBA1C 5.5   Urine Drug Screen: No results for input(s): "LABOPIA", "COCAINSCRNUR", "LABBENZ", "AMPHETMU", "THCU", "LABBARB" in the last 168 hours.  Alcohol Level  Recent Labs  Lab 10/20/22 1453  ETH <10    IMAGING past 24 hours DG CHEST PORT 1 VIEW  Result Date: 10/23/2022 CLINICAL DATA:  Fever, recovering from stroke EXAM: PORTABLE CHEST 1 VIEW COMPARISON:  Portable exam 0939 hours compared to 12/11/2019 FINDINGS: Enlargement of cardiac silhouette. Mediastinal contours and pulmonary vascularity normal. Hazy RIGHT upper lobe infiltrate near minor fissure. Mild atelectasis versus infiltrate LEFT lung base with central peribronchial thickening  again seen. Remaining lungs clear without pleural effusion or pneumothorax. Bones demineralized. IMPRESSION: Atelectasis versus infiltrate LEFT base. Mild RIGHT upper lobe infiltrate. Electronically Signed   By: Lavonia Dana M.D.   On: 10/23/2022 11:16   CT HEAD WO CONTRAST (5MM)  Result Date: 10/23/2022 CLINICAL DATA:  Stroke follow-up.  Weakness EXAM: CT HEAD WITHOUT CONTRAST TECHNIQUE: Contiguous axial images were obtained from the base of the skull through the vertex without intravenous contrast. RADIATION DOSE REDUCTION: This exam was performed according to the departmental dose-optimization program which includes automated exposure control, adjustment of the mA and/or kV according to patient size and/or use of iterative reconstruction technique. COMPARISON:  Brain MRI from 3 days ago FINDINGS: Brain: No noted progression of the left MCA branch infarct centered at the frontal lobe. Pre-existing left cerebral cortex infarcts. Thin subdural hematoma along the posterior falx and right tentorium. Lateral to the right cerebellar hemisphere subdural hemorrhage measures up to 2 mm in thickness, non progressed. The extent of subdural hemorrhage is underestimated compared  to prior MRI. Stable low-density in the right cerebral white matter attributed to chronic small vessel ischemia. Vascular: No hyperdense vessel or unexpected calcification. Skull: Normal. Negative for fracture or focal lesion. Sinuses/Orbits: No acute finding IMPRESSION: 1. No progression or hemorrhagic conversion at the left MCA branch infarct. Remote left ACA/MCA infarction. 2. Unchanged thin subdural hematoma without mass effect. Electronically Signed   By: Jorje Guild M.D.   On: 10/23/2022 07:47    PHYSICAL EXAM  Temp:  [98.1 F (36.7 C)-100.6 F (38.1 C)] 99.5 F (37.5 C) (11/17 1133) Pulse Rate:  [75-99] 90 (11/17 1133) Resp:  [16-20] 17 (11/17 1129) BP: (157-188)/(78-91) 172/80 (11/17 1133) SpO2:  [98 %-100 %] 99 % (11/17  1129)  General - frail elderly male in NAD  Cardiovascular - Regular rhythm and rate.  Neuro - awake, alert, eyes open, orientated to age, place, but not to time. No aphasia, following all simple commands but moderate dysarthria, paucity of speech. Able to name and repeat in dysarthric voice. No gaze palsy, tracking bilaterally, visual field not cooperative with constant eye movement but blinking to visual threat bilaterally. Right obvious facial droop. Tongue midline. LUE 4/5 at least, RUE proximal 0/5, bicep 2/5, bicep 1/5, finger movement 0/5 with increased muscle tone. LLE 3/5, RLE 3-/5 proximally and 2/5 distally. Sensation symmetrical bilaterally, left FTN intact grossly, gait not tested.   ASSESSMENT/PLAN [name] is a 76 y.o. male with history of hypertension, hyperlipidemia, stroke with residual right hemiparesis, CKD stage IIIb-IV, schizophrenia, BPH presented to the ED via EMS with right-sided facial droop and slurred speech. LKW 3 days ago.   Stroke:  Acute left MCA ischemic infarct likely due to chronic left ICA occlusion in the setting of AKI on CKD and hypotension  CT head Acute 6 mm right subdural hematoma extending along the falx cerebri and tentorium.  MRI  Large area of acute ischemia within the anterior left MCA territory. Bilateral subdural hematomas right greater than left. MRA Occlusion of the left ICA proximal to the skull base. Diminished flow related enhancement in the left MCA. CT head repeat 11/17 stable infarct and small SDH Carotid Doppler total occlusion left ICA, right ICA 40 to 59% stenosis 2D Echo EF 60 to 65% LDL 65 HgbA1c 5.5 VTE prophylaxis -heparin subcu clopidogrel 75 mg daily prior to admission, now on ASA 81 given stable CT repeat. NSG is OK with ASA. Continue on discharge.  Therapy recommendations:  CIR Disposition:  pending  Acute Bilateral SDH  NSGY consulted - no surgical intervention at this time  OK with ASA alone rather than  DAPT Likely traumatic from fall CT repeat 11/17 - stable small SDH NSG is OK with ASA. Now on ASA 81  Left ICA occlusion Right ICA stenosis Hx of stroke Per chart, pt had stroke in 2012 with left ICA stenosis, plan for left CEA in 6-8 weeks but eventually did not happen MRA Occlusion of the left ICA proximal to the skull base.  Carotid Doppler total occlusion left ICA, right ICA 40 to 59% stenosis No intervention needed at this time Follow up with [facility] as outpt  Hypertension Home meds:  norvasc 10mg , lisinopril 20 mg, metoprolol 100 mg  Stable BP goal 130-150 Avoid low BP Long-term BP goal normotensive  Hyperlipidemia Home meds:  atorvastatin 40mg , resumed in hospital LDL 65, goal < 70 Continue statin at discharge  Other Stroke Risk Factors Advanced Age >/= 26   Other Active Problems AKI on CKD, improving -  nephrology on board [facility] day # 3  Neurology will sign off. Please call with questions. Pt will follow up with stroke clinic NP at [facility] in about 4 weeks. Thanks for the consult.   Rosalin Hawking, MD PhD Stroke Neurology 10/23/2022 2:33 PM    To contact Stroke Continuity provider, please refer to [email or web address] After hours, contact General Neurology

## 2022-10-24 DIAGNOSIS — I639 Cerebral infarction, unspecified: Secondary | ICD-10-CM | POA: Diagnosis not present

## 2022-10-24 LAB — RENAL FUNCTION PANEL
Albumin: 2.9 g/dL — ABNORMAL LOW (ref 3.5–5.0)
Anion gap: 12 (ref 5–15)
BUN: 36 mg/dL — ABNORMAL HIGH (ref 8–23)
CO2: 22 mmol/L (ref 22–32)
Calcium: 7.1 mg/dL — ABNORMAL LOW (ref 8.9–10.3)
Chloride: 107 mmol/L (ref 98–111)
Creatinine, Ser: 4.65 mg/dL — ABNORMAL HIGH (ref 0.61–1.24)
GFR, Estimated: 12 mL/min — ABNORMAL LOW (ref >60)
Glucose, Bld: 97 mg/dL (ref 70–99)
Phosphorus: 2.3 mg/dL — ABNORMAL LOW (ref 2.5–4.6)
Potassium: 4.7 mmol/L (ref 3.5–5.1)
Sodium: 141 mmol/L (ref 135–145)

## 2022-10-24 LAB — PARATHYROID HORMONE, INTACT (NO CA): PTH: 263 pg/mL — ABNORMAL HIGH (ref 15–65)

## 2022-10-24 MED ORDER — AMLODIPINE BESYLATE 5 MG PO TABS: 5.0000 mg | ORAL_TABLET | Freq: Every day | ORAL | Status: DC

## 2022-10-24 MED ORDER — FERROUS SULFATE 325 (65 FE) MG PO TABS
325.0000 mg | ORAL_TABLET | Freq: Every day | ORAL | Status: DC
  Administered 2022-10-24 – 2022-10-27 (×4): 325 mg via ORAL
  Filled 2022-10-24 (×4): qty 1

## 2022-10-24 MED ORDER — ORAL CARE MOUTH RINSE: 15.0000 mL | OROMUCOSAL | Status: DC | PRN

## 2022-10-24 MED ORDER — AMLODIPINE BESYLATE 10 MG PO TABS
10.0000 mg | ORAL_TABLET | Freq: Every day | ORAL | Status: DC
  Administered 2022-10-24 – 2022-10-27 (×4): 10 mg via ORAL
  Filled 2022-10-24 (×4): qty 1

## 2022-10-24 MED ORDER — SORBITOL 70 % SOLN
30.0000 mL | Freq: Once | Status: AC
  Administered 2022-10-24: 30 mL via ORAL
  Filled 2022-10-24: qty 30

## 2022-10-24 MED ORDER — HYDRALAZINE HCL 20 MG/ML IJ SOLN
5.0000 mg | Freq: Four times a day (QID) | INTRAMUSCULAR | Status: DC | PRN
  Administered 2022-10-26 – 2022-10-27 (×3): 5 mg via INTRAVENOUS
  Filled 2022-10-24 (×3): qty 1

## 2022-10-24 MED ORDER — SENNOSIDES-DOCUSATE SODIUM 8.6-50 MG PO TABS
1.0000 | ORAL_TABLET | Freq: Two times a day (BID) | ORAL | Status: DC
  Administered 2022-10-24 – 2022-10-27 (×6): 1 via ORAL
  Filled 2022-10-24 (×6): qty 1

## 2022-10-24 NOTE — Progress Notes (Signed)
PROGRESS NOTE    Daniel [name]  DPO:[id number] DOB: 10/16/1946 DOA: 10/20/2022 PCP: Vivi Barrack, MD   Brief Narrative: 76 year old with past medical history significant for hypertension, hyperlipidemia, stroke with residual right hemiparesis, CKD stage IIIb-4, schizophrenia, BPH presented to ED via EMS with right-sided facial droop, slurred speech.LKW 3 days ago.  Potassium 2.9, bicarb 19, anion gap 13 creatinine 5.8 (baseline 2.0-2.3 in 2021).  MRI of brain showing large area of acute ischemia within the anterior left MCA territory.  Bilateral subdural hematoma, right greater than left.   Assessment & Plan:   Principal Problem:   Acute CVA (cerebrovascular accident) ([place]) Active Problems:   HTN (hypertension)   Hyperlipidemia   Schizophrenia ([place])   Acute kidney injury superimposed on chronic kidney disease (HCC)   Subdural hematoma (HCC)   Metabolic acidosis   Hypokalemia   Hypocalcemia   BPH (benign prostatic hyperplasia)  1-Acute Ischemic Stroke:  MRI brain: Large area of acute ischemia within the anterior left MCA territory.  No hemorrhage or mass effect.  Bilateral subdural hematoma right greater than left. MR Angio: Occlusion of the left ICA proximal to the skull base.  Diminished flow related enhancement in the left MCA.  ECHO: EF 65 %.  Grade 1 diastolic dysfunction. Carotid doppler: Right Carotid: Velocities in the right ICA are consistent with a 40-59% stenosis. The ECA appears >50% stenosed. Left Carotid: Evidence consistent with a total occlusion of the left ICA.  The ECA appears >50% stenosed. Needs to follow up with vascular out patient per neurology  LDL; 65, A1c: 5.0. CT head; subdural stable. Started on aspirin 81 mg daily on 11/17. Neuro deficit stable.   Bilateral subdural hematoma:  Neurosurgery Dr. Marcello Moores consulted.  No neurosurgical intervention or follow-up required.  Anticoagulation will be relatively contraindicated in the setting. CT head stable  on 11/17.  Monitor on baby aspirin.   AKI on CKD stage IIIb-IV:  Metabolic acidosis in setting renal failure.  In setting hemodynamic, hypovolemia.  -Started oral Bicarb -Received Bicarb Gtt, now on NS>  -Renal US, negative for hydronephrosis.  -Nephrology consulted. Appreciate assistance.  -Cr trending down from 6.4--- 4.6  PNA, UTI;  Chest x ray: Atelectasis versus infiltrate LEFT base. Mild RIGHT upper lobe infiltrate. UA with more than 50 WBC Urine culture: 100,000 gram negative rods.  Started IV ceftriaxone and Azithromycin. Day 2.   Hypokalemia: Supplement ordered.   Hypocalcemia:  Started supplement.    Vitamin D deficiency. Started supplement.   HTN;  Permissive HTN Hold lisinopril in setting AKI.  BP goal 130-160 per neurology   Hyperlipidemia:  Started lipitor.   Schizophrenia Not on meds.   BPH On flomax.   Anemia:  Prior hb 11--8 Suspect hemodilution anemia of chronic diseases.  Anemia panel. Will need IV iron when infection controlled. Start oral iron     Estimated body mass index is 21.52 kg/m as calculated from the following:   Height as of this encounter: 5\' 9"  (1.753 m).   Weight as of this encounter: 66.1 kg.   DVT prophylaxis: scd Code Status: Full code Family Communication:  Disposition Plan:  Status is: Inpatient Remains inpatient appropriate because: inpatient for stroke, subdural. AKI. CIR>     Consultants:  Neurology Neurosurgery Nephrology   Procedures:  ECHO  Antimicrobials:    Subjective: He is alert, denies headaches. Dysarthric speech stable. Right arm weakness stable.   Objective: Vitals:   10/24/22 0419 10/24/22 0752 10/24/22 1159 10/24/22 1202  BP: (!) 185/97 [name])  182/78  131/85  Pulse: 77 79  85  Resp: 20 14  14   Temp: 98.4 F (36.9 C) 98.5 F (36.9 C) 98.9 F (37.2 C)   TempSrc: Oral Oral Oral   SpO2: 100% 100%  100%  Weight:      Height:        Intake/Output Summary (Last 24 hours) at  10/24/2022 1208 Last data filed at 10/24/2022 0900 Gross per 24 hour  Intake 1662.8 ml  Output 2600 ml  Net -937.2 ml    Filed Weights   10/20/22 1347 10/21/22 2055  Weight: 86.2 kg 66.1 kg    Examination:  General exam: NAD Respiratory system: CTA Cardiovascular system: S 1, S 2 RRR Gastrointestinal system: BS present, soft, nt Central nervous system: Alert, neuro deficit stable. . Right side weakness, right arm 1/5  dysarthric speech. LE 4/5 Extremities: Right side weakness, right arm 1/5  dysarthric speech. LE 4/5    Data Reviewed: I have personally reviewed following labs and imaging studies  CBC: Recent Labs  Lab 10/20/22 1453 10/20/22 1501 10/21/22 0435 10/22/22 0427 10/23/22 0318  WBC 6.1  --  7.0 7.0 5.6  NEUTROABS 4.8  --   --   --   --   HGB 11.8* 12.6* 10.0* 8.6* 8.5*  HCT 36.8* 37.0* 29.4* 25.0* 24.3*  MCV 90.0  --  89.6 86.8 85.3  PLT 249  --  212 210 865    Basic Metabolic Panel: Recent Labs  Lab 10/20/22 1453 10/20/22 1501 10/21/22 0435 10/21/22 1456 10/22/22 0427 10/23/22 0324 10/24/22 0405  NA 137 140 139  --  138 141 141  K 2.9* 2.9* 3.9  --  3.0* 4.4 4.7  CL 105 106 111  --  111 112* 107  CO2 19*  --  12*  --  19* 22 22  GLUCOSE 108* 107* 93  --  104* 93 97  BUN 41* 39* 43*  --  40* 38* 36*  CREATININE 5.85* 6.40* 5.63*  --  5.16* 4.72* 4.65*  CALCIUM 7.1*  --  7.1*  --  6.4* 6.6* 7.1*  MG  --   --   --  1.5*  --  1.6*  --   PHOS  --   --   --   --   --  1.3* 2.3*    GFR: Estimated Creatinine Clearance: 12.6 mL/min (A) (by C-G formula based on SCr of 4.65 mg/dL (H)). Liver Function Tests: Recent Labs  Lab 10/20/22 1453 10/22/22 0427 10/23/22 0324 10/24/22 0405  AST 26  --   --   --   ALT 19  --   --   --   ALKPHOS 81  --   --   --   BILITOT 0.5  --   --   --   PROT 7.2  --   --   --   ALBUMIN 3.7 2.7* 2.6* 2.9*    No results for input(s): "LIPASE", "AMYLASE" in the last 168 hours. No results for input(s): "AMMONIA"  in the last 168 hours. Coagulation Profile: Recent Labs  Lab 10/20/22 1453  INR 1.1    Cardiac Enzymes: No results for input(s): "CKTOTAL", "CKMB", "CKMBINDEX", "TROPONINI" in the last 168 hours. BNP (last 3 results) No results for input(s): "PROBNP" in the last 8760 hours. HbA1C: No results for input(s): "HGBA1C" in the last 72 hours.  CBG: No results for input(s): "GLUCAP" in the last 168 hours. Lipid Profile: No results for input(s): "CHOL", "HDL", "  [place]", "TRIG", "CHOLHDL", "LDLDIRECT" in the last 72 hours.  Thyroid Function Tests: No results for input(s): "TSH", "T4TOTAL", "FREET4", "T3FREE", "THYROIDAB" in the last 72 hours. Anemia Panel: Recent Labs    10/23/22 0324  VITAMINB12 375  FOLATE 8.6  FERRITIN 94  TIBC 189*  IRON 18*  RETICCTPCT 0.8    Sepsis Labs: No results for input(s): "PROCALCITON", "LATICACIDVEN" in the last 168 hours.  Recent Results (from the past 240 hour(s))  Urine Culture     Status: Abnormal (Preliminary result)   Collection Time: 10/23/22  7:46 AM   Specimen: Urine, Clean Catch  Result Value Ref Range Status   Specimen Description URINE, CLEAN CATCH  Final   Special Requests NONE  Final   Culture (A)  Final    >=100,000 COLONIES/mL GRAM NEGATIVE RODS SUSCEPTIBILITIES TO FOLLOW Performed at [facility], [address], [place], [place] [address]    Report Status PENDING  Incomplete         Radiology Studies: DG CHEST PORT 1 VIEW  Result Date: 10/23/2022 CLINICAL DATA:  Fever, recovering from stroke EXAM: PORTABLE CHEST 1 VIEW COMPARISON:  Portable exam 0939 hours compared to 12/11/2019 FINDINGS: Enlargement of cardiac silhouette. Mediastinal contours and pulmonary vascularity normal. Hazy RIGHT upper lobe infiltrate near minor fissure. Mild atelectasis versus infiltrate LEFT lung base with central peribronchial thickening again seen. Remaining lungs clear without pleural effusion or pneumothorax. Bones demineralized.  IMPRESSION: Atelectasis versus infiltrate LEFT base. Mild RIGHT upper lobe infiltrate. Electronically Signed   By: Lavonia Dana M.D.   On: 10/23/2022 11:16   CT HEAD WO CONTRAST (5MM)  Result Date: 10/23/2022 CLINICAL DATA:  Stroke follow-up.  Weakness EXAM: CT HEAD WITHOUT CONTRAST TECHNIQUE: Contiguous axial images were obtained from the base of the skull through the vertex without intravenous contrast. RADIATION DOSE REDUCTION: This exam was performed according to the departmental dose-optimization program which includes automated exposure control, adjustment of the mA and/or kV according to patient size and/or use of iterative reconstruction technique. COMPARISON:  Brain MRI from 3 days ago FINDINGS: Brain: No noted progression of the left MCA branch infarct centered at the frontal lobe. Pre-existing left cerebral cortex infarcts. Thin subdural hematoma along the posterior falx and right tentorium. Lateral to the right cerebellar hemisphere subdural hemorrhage measures up to 2 mm in thickness, non progressed. The extent of subdural hemorrhage is underestimated compared to prior MRI. Stable low-density in the right cerebral white matter attributed to chronic small vessel ischemia. Vascular: No hyperdense vessel or unexpected calcification. Skull: Normal. Negative for fracture or focal lesion. Sinuses/Orbits: No acute finding IMPRESSION: 1. No progression or hemorrhagic conversion at the left MCA branch infarct. Remote left ACA/MCA infarction. 2. Unchanged thin subdural hematoma without mass effect. Electronically Signed   By: Jorje Guild M.D.   On: 10/23/2022 07:47        Scheduled Meds:   stroke: early stages of recovery book   Does not apply Once   amLODipine  10 mg Oral Daily   aspirin EC  81 mg Oral Daily   atorvastatin  40 mg Oral Daily   calcium carbonate  1 tablet Oral BID WC   folic acid  1 mg Oral Daily   heparin injection (subcutaneous)  5,000 Units Subcutaneous Q8H   magnesium  oxide  200 mg Oral BID   nystatin  5 mL Oral QID   senna-docusate  1 tablet Oral BID   sodium bicarbonate  650 mg Oral TID   tamsulosin  0.4 mg Oral Daily   Vitamin D (Ergocalciferol)  50,000 Units Oral Q7 days   Continuous Infusions:  sodium chloride 75 mL/hr at 10/24/22 0550   azithromycin Stopped (10/23/22 1425)   cefTRIAXone (ROCEPHIN)  IV 2 g (10/24/22 1101)     LOS: 4 days    Time spent: 35 minutes    Kaylinn Dedic A Saniyya Gau, MD [facility]   If 7PM-7AM, please contact night-coverage [email or web address]  10/24/2022, 12:08 PM

## 2022-10-24 NOTE — Progress Notes (Signed)
[facility] NEPHROLOGY PROGRESS NOTE  Assessment/ Plan: Pt is a 76 y.o. yo male  with past medical history significant for hypertension, HLD, BPH, prior stroke with residual right hemiparesis, CKD 3B, presented with right sided facial droop and slurred speech, seen as a consultation for the evaluation of acute kidney injury on CKD.   #Acute kidney injury on CKD stage IIIb/IV: cr around 2.2-2.7 in 2021, no recent baseline creatinine level available.  Kidney ultrasound with chronic finding and volume loss.  This is likely acute kidney injury due to hemodynamically mediated in the setting of decreased oral intake, use of ACE inhibitor and UTI.  With IV fluid the serum creatinine level is trending down.  He is nonoliguric.  Continue gentle IV hydration.  Daily lab and strict ins and out.  Expect gradual renal recovery.  No need indication for dialysis.   #Acute left MCA ischemic stroke/acute bilateral SDH: No intervention per neurosurgeon.  Neurology is following.  Per NS, only aspirin for anticoagulation.   #Hypertension: BP remains very high therefore increase amlodipine to 10 mg.  Monitor BP.  #Metabolic acidosis due to renal failure: Improved, on oral sodium bicarbonate.  #History of BPH: On Flomax.  US renal without hydronephrosis.   #Hypokalemia: Replete potassium chloride.  Monitor lab.  #Hypomagnesemia: repleted magnesium.  #Hypocalcemia/hypophosphatemia: Continue vitamin D and replenishing calcium and started sodium phosphate.  Pending PTH level.  # Anemia due to CKD: Drop in hemoglobin noted probably dilutional, iron saturation 10%.  Given temperature of 100.5 on 11/17, I will hold IV iron.  Discussed with the bedside nurse.  Subjective: Seen and examined.  Urine output is recorded around 2.6 L.  He was eating breakfast.  Denies nausea, vomiting, chest pain or shortness of breath.  Objective Vital signs in last 24 hours: Vitals:   10/23/22 2012 10/24/22 0015  10/24/22 0419 10/24/22 0752  BP: (!) 156/85 (!) 176/93 (!) 185/97 (!) 182/78  Pulse: 75 78 77 79  Resp: 20 20 20 14   Temp: 99 F (37.2 C) 99.6 F (37.6 C) 98.4 F (36.9 C) 98.5 F (36.9 C)  TempSrc: Oral Oral Oral Oral  SpO2: 100% 96% 100% 100%  Weight:      Height:       Weight change:   Intake/Output Summary (Last 24 hours) at 10/24/2022 0852 Last data filed at 10/24/2022 0550 Gross per 24 hour  Intake 1902.8 ml  Output 2000 ml  Net -97.2 ml        Labs: RENAL PANEL Recent Labs    10/20/22 1453 10/20/22 1501 10/21/22 0435 10/21/22 1456 10/22/22 0427 10/23/22 0324 10/24/22 0405  NA 137 140 139  --  138 141 141  K 2.9* 2.9* 3.9  --  3.0* 4.4 4.7  CL 105 106 111  --  111 112* 107  CO2 19*  --  12*  --  19* 22 22  GLUCOSE 108* 107* 93  --  104* 93 97  BUN 41* 39* 43*  --  40* 38* 36*  CREATININE 5.85* 6.40* 5.63*  --  5.16* 4.72* 4.65*  CALCIUM 7.1*  --  7.1*  --  6.4* 6.6* 7.1*  MG  --   --   --  1.5*  --  1.6*  --   PHOS  --   --   --   --   --  1.3* 2.3*  ALBUMIN 3.7  --   --   --  2.7* 2.6* 2.9*  Liver Function Tests: Recent Labs  Lab 10/20/22 1453 10/22/22 0427 10/23/22 0324 10/24/22 0405  AST 26  --   --   --   ALT 19  --   --   --   ALKPHOS 81  --   --   --   BILITOT 0.5  --   --   --   PROT 7.2  --   --   --   ALBUMIN 3.7 2.7* 2.6* 2.9*    No results for input(s): "LIPASE", "AMYLASE" in the last 168 hours. No results for input(s): "AMMONIA" in the last 168 hours. CBC: Recent Labs    10/20/22 1453 10/20/22 1501 10/21/22 0435 10/22/22 0427 10/23/22 0318 10/23/22 0324  HGB 11.8* 12.6* 10.0* 8.6* 8.5*  --   MCV 90.0  --  89.6 86.8 85.3  --   VITAMINB12  --   --   --   --   --  375  FOLATE  --   --   --   --   --  8.6  FERRITIN  --   --   --   --   --  94  TIBC  --   --   --   --   --  189*  IRON  --   --   --   --   --  18*  RETICCTPCT  --   --   --   --   --  0.8     Cardiac Enzymes: No results for input(s): "CKTOTAL",  "CKMB", "CKMBINDEX", "TROPONINI" in the last 168 hours. CBG: No results for input(s): "GLUCAP" in the last 168 hours.  Iron Studies:  Recent Labs    10/23/22 0324  IRON 18*  TIBC 189*  FERRITIN 94    Studies/Results: DG CHEST PORT 1 VIEW  Result Date: 10/23/2022 CLINICAL DATA:  Fever, recovering from stroke EXAM: PORTABLE CHEST 1 VIEW COMPARISON:  Portable exam 0939 hours compared to 12/11/2019 FINDINGS: Enlargement of cardiac silhouette. Mediastinal contours and pulmonary vascularity normal. Hazy RIGHT upper lobe infiltrate near minor fissure. Mild atelectasis versus infiltrate LEFT lung base with central peribronchial thickening again seen. Remaining lungs clear without pleural effusion or pneumothorax. Bones demineralized. IMPRESSION: Atelectasis versus infiltrate LEFT base. Mild RIGHT upper lobe infiltrate. Electronically Signed   By: Lavonia Dana M.D.   On: 10/23/2022 11:16   CT HEAD WO CONTRAST (5MM)  Result Date: 10/23/2022 CLINICAL DATA:  Stroke follow-up.  Weakness EXAM: CT HEAD WITHOUT CONTRAST TECHNIQUE: Contiguous axial images were obtained from the base of the skull through the vertex without intravenous contrast. RADIATION DOSE REDUCTION: This exam was performed according to the departmental dose-optimization program which includes automated exposure control, adjustment of the mA and/or kV according to patient size and/or use of iterative reconstruction technique. COMPARISON:  Brain MRI from 3 days ago FINDINGS: Brain: No noted progression of the left MCA branch infarct centered at the frontal lobe. Pre-existing left cerebral cortex infarcts. Thin subdural hematoma along the posterior falx and right tentorium. Lateral to the right cerebellar hemisphere subdural hemorrhage measures up to 2 mm in thickness, non progressed. The extent of subdural hemorrhage is underestimated compared to prior MRI. Stable low-density in the right cerebral white matter attributed to chronic small vessel  ischemia. Vascular: No hyperdense vessel or unexpected calcification. Skull: Normal. Negative for fracture or focal lesion. Sinuses/Orbits: No acute finding IMPRESSION: 1. No progression or hemorrhagic conversion at the left MCA branch infarct. Remote left ACA/MCA infarction. 2. Unchanged  thin subdural hematoma without mass effect. Electronically Signed   By: Jorje Guild M.D.   On: 10/23/2022 07:47    Medications: Infusions:  sodium chloride 75 mL/hr at 10/24/22 0550   azithromycin Stopped (10/23/22 1425)   cefTRIAXone (ROCEPHIN)  IV      Scheduled Medications:   stroke: early stages of recovery book   Does not apply Once   amLODipine  5 mg Oral Daily   aspirin EC  81 mg Oral Daily   atorvastatin  40 mg Oral Daily   calcium carbonate  1 tablet Oral BID WC   folic acid  1 mg Oral Daily   heparin injection (subcutaneous)  5,000 Units Subcutaneous Q8H   magnesium oxide  200 mg Oral BID   nystatin  5 mL Oral QID   sodium bicarbonate  650 mg Oral TID   tamsulosin  0.4 mg Oral Daily   Vitamin D (Ergocalciferol)  50,000 Units Oral Q7 days    have reviewed scheduled and prn medications.  Physical Exam: General: Chronically ill looking male,NAD, not in distress Heart:RRR, s1s2 nl Lungs:clear b/l, no crackle Abdomen:soft, Non-tender, non-distended Extremities:No edema Neurology: Alert, awake and oriented.  Daniel [name] 10/24/2022,8:52 AM  LOS: 4 days

## 2022-10-25 DIAGNOSIS — I639 Cerebral infarction, unspecified: Secondary | ICD-10-CM | POA: Diagnosis not present

## 2022-10-25 LAB — URINE CULTURE: Culture: >=100000 — AB

## 2022-10-25 LAB — RENAL FUNCTION PANEL
Albumin: 2.6 g/dL — ABNORMAL LOW (ref 3.5–5.0)
Anion gap: 12 (ref 5–15)
BUN: 33 mg/dL — ABNORMAL HIGH (ref 8–23)
CO2: 22 mmol/L (ref 22–32)
Calcium: 7.4 mg/dL — ABNORMAL LOW (ref 8.9–10.3)
Chloride: 107 mmol/L (ref 98–111)
Creatinine, Ser: 4.38 mg/dL — ABNORMAL HIGH (ref 0.61–1.24)
GFR, Estimated: 13 mL/min — ABNORMAL LOW (ref >60)
Glucose, Bld: 99 mg/dL (ref 70–99)
Phosphorus: 2.9 mg/dL (ref 2.5–4.6)
Potassium: 4.5 mmol/L (ref 3.5–5.1)
Sodium: 141 mmol/L (ref 135–145)

## 2022-10-25 LAB — CBC
HCT: 25.3 % — ABNORMAL LOW (ref 39.0–52.0)
Hemoglobin: 8.5 g/dL — ABNORMAL LOW (ref 13.0–17.0)
MCH: 30 pg (ref 26.0–34.0)
MCHC: 33.6 g/dL (ref 30.0–36.0)
MCV: 89.4 fL (ref 80.0–100.0)
Platelets: 252 10*3/uL (ref 150–400)
RBC: 2.83 MIL/uL — ABNORMAL LOW (ref 4.22–5.81)
RDW: 13.4 % (ref 11.5–15.5)
WBC: 6.3 10*3/uL (ref 4.0–10.5)
nRBC: 0 % (ref 0.0–0.2)

## 2022-10-25 LAB — MAGNESIUM: Magnesium: 1.6 mg/dL — ABNORMAL LOW (ref 1.7–2.4)

## 2022-10-25 MED ORDER — MAGNESIUM SULFATE 2 GM/50ML IV SOLN
2.0000 g | Freq: Once | INTRAVENOUS | Status: AC
  Administered 2022-10-25: 2 g via INTRAVENOUS
  Filled 2022-10-25: qty 50

## 2022-10-25 NOTE — Progress Notes (Signed)
[facility] NEPHROLOGY PROGRESS NOTE  Assessment/ Plan: Daniel [name] is a 76 y.o. yo male  with past medical history significant for hypertension, HLD, BPH, prior stroke with residual right hemiparesis, CKD 3B, presented with right sided facial droop and slurred speech, seen as a consultation for the evaluation of acute kidney injury on CKD.   #Acute kidney injury on CKD stage IIIb/IV: cr around 2.2-2.7 in 2021, no recent baseline creatinine level available.  Kidney ultrasound with chronic finding and volume loss.  This is likely acute kidney injury due to hemodynamically mediated in the setting of decreased oral intake, use of ACE inhibitor and UTI.  Treating with IV fluid with increased urine output and creatinine level is gradually trending down.  Continue gentle IV hydration.  Daily lab and strict ins and out. No need indication for dialysis.   #Acute left MCA ischemic stroke/acute bilateral SDH: No intervention per neurosurgeon.  Neurology is following.  Per NS, only aspirin for anticoagulation.   #Hypertension: BP remains very high therefore increased amlodipine to 10 mg.  Monitor BP.  #Metabolic acidosis due to renal failure: Improved, on oral sodium bicarbonate.  #History of BPH: On Flomax.  US renal without hydronephrosis.   #Hypokalemia: Replete potassium chloride.  Monitor lab.  #Hypomagnesemia: repleting magnesium.  #Hypocalcemia/hypophosphatemia: Continue vitamin D and replenishing calcium and started sodium phosphate. PTH 263.  # Anemia due to CKD: Drop in hemoglobin noted probably dilutional, iron saturation 10%.  Given intermittent fever and on antibiotics, I will hold IV iron.    Discussed with the bedside nurse.  Subjective: Seen and examined.  Urine output is recorded around 2.5 L.  No new event.  Denies nausea, vomiting, dysgeusia, chest pain or shortness of breath..  Objective Vital signs in last 24 hours: Vitals:   10/24/22 2000 10/24/22 2325 10/25/22 0347  10/25/22 0931  BP: (!) 153/82 (!) 161/86 (!) 166/76 (!) 178/85  Pulse:  (!) 102  83  Resp: 18 16 17 14   Temp: 99.3 F (37.4 C) 99.3 F (37.4 C) 98.8 F (37.1 C) 98 F (36.7 C)  TempSrc: Oral Oral Oral Oral  SpO2: 100% 100% 100% 97%  Weight:      Height:       Weight change:   Intake/Output Summary (Last 24 hours) at 10/25/2022 0955 Last data filed at 10/24/2022 2329 Gross per 24 hour  Intake --  Output 1950 ml  Net -1950 ml        Labs: RENAL PANEL Recent Labs    10/20/22 1453 10/20/22 1501 10/21/22 0435 10/21/22 1456 10/22/22 0427 10/23/22 0324 10/24/22 0405 10/25/22 0415  NA 137 140 139  --  138 141 141 141  K 2.9* 2.9* 3.9  --  3.0* 4.4 4.7 4.5  CL 105 106 111  --  111 112* 107 107  CO2 19*  --  12*  --  19* 22 22 22   GLUCOSE 108* 107* 93  --  104* 93 97 99  BUN 41* 39* 43*  --  40* 38* 36* 33*  CREATININE 5.85* 6.40* 5.63*  --  5.16* 4.72* 4.65* 4.38*  CALCIUM 7.1*  --  7.1*  --  6.4* 6.6* 7.1* 7.4*  MG  --   --   --  1.5*  --  1.6*  --  1.6*  PHOS  --   --   --   --   --  1.3* 2.3* 2.9  ALBUMIN 3.7  --   --   --  2.7*  2.6* 2.9* 2.6*      Liver Function Tests: Recent Labs  Lab 10/20/22 1453 10/22/22 0427 10/23/22 0324 10/24/22 0405 10/25/22 0415  AST 26  --   --   --   --   ALT 19  --   --   --   --   ALKPHOS 81  --   --   --   --   BILITOT 0.5  --   --   --   --   PROT 7.2  --   --   --   --   ALBUMIN 3.7   < > 2.6* 2.9* 2.6*   < > = values in this interval not displayed.    No results for input(s): "LIPASE", "AMYLASE" in the last 168 hours. No results for input(s): "AMMONIA" in the last 168 hours. CBC: Recent Labs    10/20/22 1501 10/21/22 0435 10/22/22 0427 10/23/22 0318 10/23/22 0324 10/25/22 0415  HGB 12.6* 10.0* 8.6* 8.5*  --  8.5*  MCV  --  89.6 86.8 85.3  --  89.4  VITAMINB12  --   --   --   --  375  --   FOLATE  --   --   --   --  8.6  --   FERRITIN  --   --   --   --  94  --   TIBC  --   --   --   --  189*  --    IRON  --   --   --   --  18*  --   RETICCTPCT  --   --   --   --  0.8  --      Cardiac Enzymes: No results for input(s): "CKTOTAL", "CKMB", "CKMBINDEX", "TROPONINI" in the last 168 hours. CBG: No results for input(s): "GLUCAP" in the last 168 hours.  Iron Studies:  Recent Labs    10/23/22 0324  IRON 18*  TIBC 189*  FERRITIN 94    Studies/Results: No results found.  Medications: Infusions:  sodium chloride 75 mL/hr at 10/25/22 0018   azithromycin 500 mg (10/24/22 1208)   cefTRIAXone (ROCEPHIN)  IV 2 g (10/24/22 1101)    Scheduled Medications:   stroke: early stages of recovery book   Does not apply Once   amLODipine  10 mg Oral Daily   aspirin EC  81 mg Oral Daily   atorvastatin  40 mg Oral Daily   calcium carbonate  1 tablet Oral BID WC   ferrous sulfate  325 mg Oral Q breakfast   folic acid  1 mg Oral Daily   heparin injection (subcutaneous)  5,000 Units Subcutaneous Q8H   magnesium oxide  200 mg Oral BID   nystatin  5 mL Oral QID   senna-docusate  1 tablet Oral BID   sodium bicarbonate  650 mg Oral TID   tamsulosin  0.4 mg Oral Daily   Vitamin D (Ergocalciferol)  50,000 Units Oral Q7 days    have reviewed scheduled and prn medications.  Physical Exam: General: Chronically ill looking male,NAD, not in distress Heart:RRR, s1s2 nl Lungs: Clear b/l, no crackle Abdomen:soft, Non-tender, non-distended Extremities:No edema Neurology: Alert, awake and oriented.  Paige Vanderwoude Prasad Toussaint Golson 10/25/2022,9:55 AM  LOS: 5 days

## 2022-10-25 NOTE — Progress Notes (Signed)
PROGRESS NOTE    Daniel [name]  JHE:[id number] DOB: 03-15-1946 DOA: 10/20/2022 PCP: Vivi Barrack, MD   Brief Narrative: 76 year old with past medical history significant for hypertension, hyperlipidemia, stroke with residual right hemiparesis, CKD stage IIIb-4, schizophrenia, BPH presented to ED via EMS with right-sided facial droop, slurred speech.LKW 3 days ago.  Potassium 2.9, bicarb 19, anion gap 13 creatinine 5.8 (baseline 2.0-2.3 in 2021).  MRI of brain showing large area of acute ischemia within the anterior left MCA territory.  Bilateral subdural hematoma, right greater than left.   Assessment & Plan:   Principal Problem:   Acute CVA (cerebrovascular accident) ([place]) Active Problems:   HTN (hypertension)   Hyperlipidemia   Schizophrenia ([place])   Acute kidney injury superimposed on chronic kidney disease (HCC)   Subdural hematoma (HCC)   Metabolic acidosis   Hypokalemia   Hypocalcemia   BPH (benign prostatic hyperplasia)  1-Acute Ischemic Stroke:  MRI brain: Large area of acute ischemia within the anterior left MCA territory.  No hemorrhage or mass effect.  Bilateral subdural hematoma right greater than left. MR Angio: Occlusion of the left ICA proximal to the skull base.  Diminished flow related enhancement in the left MCA.  ECHO: EF 65 %.  Grade 1 diastolic dysfunction. Carotid doppler: Right Carotid: Velocities in the right ICA are consistent with a 40-59% stenosis. The ECA appears >50% stenosed. Left Carotid: Evidence consistent with a total occlusion of the left ICA.  The ECA appears >50% stenosed. Needs to follow up with vascular out patient per neurology  LDL; 65, A1c: 5.0. CT head; subdural stable. Started on aspirin 81 mg daily on 11/17. Neuro deficit stable.   Bilateral subdural hematoma:  Neurosurgery Dr. Marcello Moores consulted.  No neurosurgical intervention or follow-up required.  Anticoagulation will be relatively contraindicated in the setting. CT head stable  on 11/17.  Monitor on baby aspirin.   AKI on CKD stage IIIb-IV:  Metabolic acidosis in setting renal failure.  In setting hemodynamic, hypovolemia.  -Started oral Bicarb -Received Bicarb Gtt, now on NS>  -Renal US, negative for hydronephrosis.  -Nephrology consulted. Appreciate assistance.  -Cr trending down from 6.4--- 4.6--4.3  PNA, UTI;  Chest x ray: Atelectasis versus infiltrate LEFT base. Mild RIGHT upper lobe infiltrate. UA with more than 50 WBC Urine culture: 100,000 E coli.  Started IV ceftriaxone and Azithromycin. Day 3.   Hypokalemia: Resolved.   Hypocalcemia:  Started supplement.    Vitamin D deficiency. Started supplement.   HTN;  Permissive HTN Hold lisinopril in setting AKI.  BP goal 130-160 per neurology  Started on Norvasc, increased dose.   Hyperlipidemia:  Started lipitor.   Schizophrenia Not on meds.   BPH On flomax.   Anemia:  Prior hb 11--8 Suspect hemodilution anemia of chronic diseases.  Anemia panel. Will need IV iron when infection controlled. Start oral iron   Hypomagnesemia; replete IV>    Estimated body mass index is 21.52 kg/m as calculated from the following:   Height as of this encounter: 5\' 9"  (1.753 m).   Weight as of this encounter: 66.1 kg.   DVT prophylaxis: scd Code Status: Full code Family Communication:  Disposition Plan:  Status is: Inpatient Remains inpatient appropriate because: inpatient for stroke, subdural. AKI. CIR>     Consultants:  Neurology Neurosurgery Nephrology   Procedures:  ECHO  Antimicrobials:    Subjective: He is alert, dysarthric speech stable. Focal deficit stable, he was able to move slightly right hand  Objective: Vitals:  10/24/22 2000 10/24/22 2325 10/25/22 0347 10/25/22 0931  BP: (!) 153/82 (!) 161/86 (!) 166/76 (!) 178/85  Pulse:  (!) 102  83  Resp: 18 16 17 14   Temp: 99.3 F (37.4 C) 99.3 F (37.4 C) 98.8 F (37.1 C) 98 F (36.7 C)  TempSrc: Oral Oral Oral Oral   SpO2: 100% 100% 100% 97%  Weight:      Height:        Intake/Output Summary (Last 24 hours) at 10/25/2022 1244 Last data filed at 10/24/2022 2329 Gross per 24 hour  Intake --  Output 1950 ml  Net -1950 ml    Filed Weights   10/20/22 1347 10/21/22 2055  Weight: 86.2 kg 66.1 kg    Examination:  General exam: NAD Respiratory system: CTA Cardiovascular system: S 1, S 2 RRR Gastrointestinal system: BS present, soft, nt Central nervous system: Alert, neuro deficit stable. . Right side weakness, right arm 1/5  dysarthric speech. LE 4/5 Extremities: Right side weakness, right arm 1/5  dysarthric speech. LE 4/5    Data Reviewed: I have personally reviewed following labs and imaging studies  CBC: Recent Labs  Lab 10/20/22 1453 10/20/22 1501 10/21/22 0435 10/22/22 0427 10/23/22 0318 10/25/22 0415  WBC 6.1  --  7.0 7.0 5.6 6.3  NEUTROABS 4.8  --   --   --   --   --   HGB 11.8* 12.6* 10.0* 8.6* 8.5* 8.5*  HCT 36.8* 37.0* 29.4* 25.0* 24.3* 25.3*  MCV 90.0  --  89.6 86.8 85.3 89.4  PLT 249  --  212 210 230 702    Basic Metabolic Panel: Recent Labs  Lab 10/21/22 0435 10/21/22 1456 10/22/22 0427 10/23/22 0324 10/24/22 0405 10/25/22 0415  NA 139  --  138 141 141 141  K 3.9  --  3.0* 4.4 4.7 4.5  CL 111  --  111 112* 107 107  CO2 12*  --  19* 22 22 22   GLUCOSE 93  --  104* 93 97 99  BUN 43*  --  40* 38* 36* 33*  CREATININE 5.63*  --  5.16* 4.72* 4.65* 4.38*  CALCIUM 7.1*  --  6.4* 6.6* 7.1* 7.4*  MG  --  1.5*  --  1.6*  --  1.6*  PHOS  --   --   --  1.3* 2.3* 2.9    GFR: Estimated Creatinine Clearance: 13.4 mL/min (A) (by C-G formula based on SCr of 4.38 mg/dL (H)). Liver Function Tests: Recent Labs  Lab 10/20/22 1453 10/22/22 0427 10/23/22 0324 10/24/22 0405 10/25/22 0415  AST 26  --   --   --   --   ALT 19  --   --   --   --   ALKPHOS 81  --   --   --   --   BILITOT 0.5  --   --   --   --   PROT 7.2  --   --   --   --   ALBUMIN 3.7 2.7* 2.6* 2.9*  2.6*    No results for input(s): "LIPASE", "AMYLASE" in the last 168 hours. No results for input(s): "AMMONIA" in the last 168 hours. Coagulation Profile: Recent Labs  Lab 10/20/22 1453  INR 1.1    Cardiac Enzymes: No results for input(s): "CKTOTAL", "CKMB", "CKMBINDEX", "TROPONINI" in the last 168 hours. BNP (last 3 results) No results for input(s): "PROBNP" in the last 8760 hours. HbA1C: No results for input(s): "HGBA1C" in the last  72 hours.  CBG: No results for input(s): "GLUCAP" in the last 168 hours. Lipid Profile: No results for input(s): "CHOL", "HDL", "LDLCALC", "TRIG", "CHOLHDL", "LDLDIRECT" in the last 72 hours.  Thyroid Function Tests: No results for input(s): "TSH", "T4TOTAL", "FREET4", "T3FREE", "THYROIDAB" in the last 72 hours. Anemia Panel: Recent Labs    10/23/22 0324  VITAMINB12 375  FOLATE 8.6  FERRITIN 94  TIBC 189*  IRON 18*  RETICCTPCT 0.8    Sepsis Labs: No results for input(s): "PROCALCITON", "LATICACIDVEN" in the last 168 hours.  Recent Results (from the past 240 hour(s))  Urine Culture     Status: Abnormal   Collection Time: 10/23/22  7:46 AM   Specimen: Urine, Clean Catch  Result Value Ref Range Status   Specimen Description URINE, CLEAN CATCH  Final   Special Requests   Final    NONE Performed at [facility], [address], [place], [place] [address]    Culture >=100,000 COLONIES/mL ESCHERICHIA COLI (A)  Final   Report Status 10/25/2022 FINAL  Final   Organism ID, Bacteria ESCHERICHIA COLI (A)  Final      Susceptibility   Escherichia coli - MIC*    AMPICILLIN <=2 SENSITIVE Sensitive     CEFAZOLIN <=4 SENSITIVE Sensitive     CEFEPIME <=0.12 SENSITIVE Sensitive     CEFTRIAXONE <=0.25 SENSITIVE Sensitive     CIPROFLOXACIN <=0.25 SENSITIVE Sensitive     GENTAMICIN <=1 SENSITIVE Sensitive     IMIPENEM <=0.25 SENSITIVE Sensitive     NITROFURANTOIN <=16 SENSITIVE Sensitive     TRIMETH/SULFA <=20 SENSITIVE Sensitive      AMPICILLIN/SULBACTAM <=2 SENSITIVE Sensitive     PIP/TAZO <=4 SENSITIVE Sensitive     * >=100,000 COLONIES/mL ESCHERICHIA COLI         Radiology Studies: No results found.      Scheduled Meds:   stroke: early stages of recovery book   Does not apply Once   amLODipine  10 mg Oral Daily   aspirin EC  81 mg Oral Daily   atorvastatin  40 mg Oral Daily   calcium carbonate  1 tablet Oral BID WC   ferrous sulfate  325 mg Oral Q breakfast   folic acid  1 mg Oral Daily   heparin injection (subcutaneous)  5,000 Units Subcutaneous Q8H   magnesium oxide  200 mg Oral BID   nystatin  5 mL Oral QID   senna-docusate  1 tablet Oral BID   sodium bicarbonate  650 mg Oral TID   tamsulosin  0.4 mg Oral Daily   Vitamin D (Ergocalciferol)  50,000 Units Oral Q7 days   Continuous Infusions:  sodium chloride 75 mL/hr at 10/25/22 0018   azithromycin 500 mg (10/25/22 1154)   cefTRIAXone (ROCEPHIN)  IV 2 g (10/25/22 1058)     LOS: 5 days    Time spent: 35 minutes    Herron Fero A Murle Hellstrom, MD [facility]   If 7PM-7AM, please contact night-coverage [email or web address]  10/25/2022, 12:44 PM

## 2022-10-26 DIAGNOSIS — I6522 Occlusion and stenosis of left carotid artery: Secondary | ICD-10-CM | POA: Diagnosis not present

## 2022-10-26 DIAGNOSIS — S065XAA Traumatic subdural hemorrhage with loss of consciousness status unknown, initial encounter: Secondary | ICD-10-CM

## 2022-10-26 DIAGNOSIS — I639 Cerebral infarction, unspecified: Secondary | ICD-10-CM | POA: Diagnosis not present

## 2022-10-26 LAB — RENAL FUNCTION PANEL
Albumin: 2.6 g/dL — ABNORMAL LOW (ref 3.5–5.0)
Anion gap: 11 (ref 5–15)
BUN: 31 mg/dL — ABNORMAL HIGH (ref 8–23)
CO2: 21 mmol/L — ABNORMAL LOW (ref 22–32)
Calcium: 7.8 mg/dL — ABNORMAL LOW (ref 8.9–10.3)
Chloride: 108 mmol/L (ref 98–111)
Creatinine, Ser: 4.38 mg/dL — ABNORMAL HIGH (ref 0.61–1.24)
GFR, Estimated: 13 mL/min — ABNORMAL LOW (ref >60)
Glucose, Bld: 92 mg/dL (ref 70–99)
Phosphorus: 3.1 mg/dL (ref 2.5–4.6)
Potassium: 4.6 mmol/L (ref 3.5–5.1)
Sodium: 140 mmol/L (ref 135–145)

## 2022-10-26 LAB — MAGNESIUM: Magnesium: 2.1 mg/dL (ref 1.7–2.4)

## 2022-10-26 MED ORDER — HYDRALAZINE HCL 25 MG PO TABS
25.0000 mg | ORAL_TABLET | Freq: Two times a day (BID) | ORAL | Status: DC
  Administered 2022-10-26 – 2022-10-27 (×3): 25 mg via ORAL
  Filled 2022-10-26 (×3): qty 1

## 2022-10-26 NOTE — Progress Notes (Signed)
[facility] NEPHROLOGY PROGRESS NOTE  Assessment/ Plan: Pt is a 76 y.o. yo male  with past medical history significant for hypertension, HLD, BPH, prior stroke with residual right hemiparesis, CKD 3B, presented with right sided facial droop and slurred speech, seen as a consultation for the evaluation of acute kidney injury on CKD.   #Acute kidney injury on CKD stage IIIb/IV: cr around 2.2-2.7 in 2021, no recent baseline creatinine level available.  Kidney ultrasound with chronic finding and volume loss.  This is likely acute kidney injury due to hemodynamically mediated in the setting of decreased oral intake, use of ACE inhibitor and UTI.  Treating with IV fluid with increased urine output and creatinine level is gradually trending down.  Creatinine seems to have plateaued today.  Likely AKI will take some time to resolve fully.  Would continue IV fluids until the patient is consistently taking and good hydration by mouth.  No need for [place] to continue to follow at this time.  We will sign off.  Please notify us of any concerns.   #Acute left MCA ischemic stroke/acute bilateral SDH: No intervention per neurosurgeon.  Neurology is following.  Per NS, only aspirin for anticoagulation.   #Hypertension: BP remains very high therefore increased amlodipine to 10 mg.  Monitor BP.  #Metabolic acidosis due to renal failure: Improved, on oral sodium bicarbonate.  #History of BPH: On Flomax.  US renal without hydronephrosis.   #Hypokalemia: Replete potassium chloride.  Monitor lab.  #Hypocalcemia/hypophosphatemia: Calcium corrects near normal when accounting for albumin.  Phosphate normal now  # Anemia due to CKD: Drop in hemoglobin noted probably dilutional, iron saturation 10%.  Given intermittent fever and on antibiotics, I will hold IV iron.  Would benefit from iron in the near future.  #Metabolic acidosis: On sodium bicarbonate 650 mg 3 times daily.  Serum bicarbonate normal.   Consider stopping if serum bicarbonate is greater than 24   Subjective: Continues to have good urine output.  Creatinine plateaued today.  Denies any significant nausea, vomiting, shortness of breath  Objective Vital signs in last 24 hours: Vitals:   10/25/22 2338 10/26/22 0325 10/26/22 0450 10/26/22 0724  BP: (!) 160/80 (!) 170/90 (!) 166/83 (!) 151/89  Pulse: 71 92 90 95  Resp: 17 17 17 18   Temp: 99.8 F (37.7 C) 98.9 F (37.2 C)  98.2 F (36.8 C)  TempSrc: Oral Oral  Oral  SpO2: 96% 95%  100%  Weight:      Height:       Weight change:   Intake/Output Summary (Last 24 hours) at 10/26/2022 0907 Last data filed at 10/26/2022 0121 Gross per 24 hour  Intake --  Output 1650 ml  Net -1650 ml       Labs: RENAL PANEL Recent Labs    10/20/22 1453 10/20/22 1501 10/21/22 0435 10/21/22 1456 10/22/22 0427 10/23/22 0324 10/24/22 0405 10/25/22 0415 10/26/22 0348  NA 137 140 139  --  138 141 141 141 140  K 2.9* 2.9* 3.9  --  3.0* 4.4 4.7 4.5 4.6  CL 105 106 111  --  111 112* 107 107 108  CO2 19*  --  12*  --  19* 22 22 22  21*  GLUCOSE 108* 107* 93  --  104* 93 97 99 92  BUN 41* 39* 43*  --  40* 38* 36* 33* 31*  CREATININE 5.85* 6.40* 5.63*  --  5.16* 4.72* 4.65* 4.38* 4.38*  CALCIUM 7.1*  --  7.1*  --  6.4* 6.6* 7.1* 7.4* 7.8*  MG  --   --   --  1.5*  --  1.6*  --  1.6* 2.1  PHOS  --   --   --   --   --  1.3* 2.3* 2.9 3.1  ALBUMIN 3.7  --   --   --  2.7* 2.6* 2.9* 2.6* 2.6*     Liver Function Tests: Recent Labs  Lab 10/20/22 1453 10/22/22 0427 10/24/22 0405 10/25/22 0415 10/26/22 0348  AST 26  --   --   --   --   ALT 19  --   --   --   --   ALKPHOS 81  --   --   --   --   BILITOT 0.5  --   --   --   --   PROT 7.2  --   --   --   --   ALBUMIN 3.7   < > 2.9* 2.6* 2.6*   < > = values in this interval not displayed.   No results for input(s): "LIPASE", "AMYLASE" in the last 168 hours. No results for input(s): "AMMONIA" in the last 168 hours. CBC: Recent  Labs    10/20/22 1501 10/21/22 0435 10/22/22 0427 10/23/22 0318 10/23/22 0324 10/25/22 0415  HGB 12.6* 10.0* 8.6* 8.5*  --  8.5*  MCV  --  89.6 86.8 85.3  --  89.4  VITAMINB12  --   --   --   --  375  --   FOLATE  --   --   --   --  8.6  --   FERRITIN  --   --   --   --  94  --   TIBC  --   --   --   --  189*  --   IRON  --   --   --   --  18*  --   RETICCTPCT  --   --   --   --  0.8  --     Cardiac Enzymes: No results for input(s): "CKTOTAL", "CKMB", "CKMBINDEX", "TROPONINI" in the last 168 hours. CBG: No results for input(s): "GLUCAP" in the last 168 hours.  Iron Studies: No results for input(s): "IRON", "TIBC", "TRANSFERRIN", "FERRITIN" in the last 72 hours. Studies/Results: No results found.  Medications: Infusions:  sodium chloride 75 mL/hr at 10/26/22 0720   azithromycin 500 mg (10/25/22 1154)   cefTRIAXone (ROCEPHIN)  IV 2 g (10/25/22 1058)    Scheduled Medications:   stroke: early stages of recovery book   Does not apply Once   amLODipine  10 mg Oral Daily   aspirin EC  81 mg Oral Daily   atorvastatin  40 mg Oral Daily   calcium carbonate  1 tablet Oral BID WC   ferrous sulfate  325 mg Oral Q breakfast   folic acid  1 mg Oral Daily   heparin injection (subcutaneous)  5,000 Units Subcutaneous Q8H   magnesium oxide  200 mg Oral BID   nystatin  5 mL Oral QID   senna-docusate  1 tablet Oral BID   sodium bicarbonate  650 mg Oral TID   tamsulosin  0.4 mg Oral Daily   Vitamin D (Ergocalciferol)  50,000 Units Oral Q7 days    have reviewed scheduled and prn medications.  Physical Exam: General: Chronically ill looking male,NAD, not in distress Heart: Normal rate, no rub Lungs: Bilateral chest rise with no increased work of  breathing Abdomen:soft, Non-tender, non-distended Extremities:No edema, warm and well perfused Neurology: Alert and awake  Shaune Pollack Akeema Broder 10/26/2022,9:07 AM  LOS: 6 days

## 2022-10-26 NOTE — Progress Notes (Signed)
Occupational Therapy Treatment Patient Details Name: Daniel [name] MRN: [id number] DOB: 03-26-1946 Today's Date: 10/26/2022   History of present illness 76 y.o. male [facility] hospital on [facility]/20/2022 with R facial droop and slurred speech. MRI demonstrates large L MCA CVA and bilateral SDH, R>L. PMH includes HTN, HLD, CVA w/ R hemiparesis, CKD IV, schizophrenia, BPH.   OT comments  Patient continues to make steady progress towards goals in skilled OT session. Patient's session encompassed self care activities at chair level as patient typically mobilizes with his power chair and had just been transferred to recliner with assist from PTA. Patient with slowed processing, but able to complete simple ADL tasks at mod A due to solely using LUE. Patient handed off to SLP at end of session. OT continuing to endorse AIR placement given patient's ability to complete tasks prior to most recent CVA. OT will continue to follow.    Recommendations for follow up therapy are one component of a multi-disciplinary discharge planning process, led by the attending physician.  Recommendations may be updated based on patient status, additional functional criteria and insurance authorization.    Follow Up Recommendations  Acute inpatient rehab (3hours/day)     Assistance Recommended at Discharge Frequent or constant Supervision/Assistance  Patient can return home with the following  A little help with walking and/or transfers;A little help with bathing/dressing/bathroom;Assistance with cooking/housework;Direct supervision/assist for medications management;Direct supervision/assist for financial management;Assist for transportation;Help with stairs or ramp for entrance;Assistance with feeding   Equipment Recommendations  Other (comment) (defer to next venue)    Recommendations for Other Services      Precautions / Restrictions Precautions Precautions: Fall Restrictions Weight Bearing Restrictions: No        Mobility Bed Mobility               General bed mobility comments: up in recliner upon OT entry    Transfers                         Balance Overall balance assessment: Needs assistance Sitting-balance support: No upper extremity supported, Feet supported Sitting balance-Leahy Scale: Fair                                     ADL either performed or assessed with clinical judgement   ADL Overall ADL's : Needs assistance/impaired     Grooming: Wash/dry hands;Wash/dry face;Oral care;Moderate assistance Grooming Details (indicate cue type and reason): Mod A, only engaging with LUE                             Functional mobility during ADLs: Moderate assistance;+2 for safety/equipment;Cueing for safety;Cueing for sequencing;+2 for physical assistance General ADL Comments: Session focus on seated ADLs as patient had just transitioned to chair with PTA    Extremity/Trunk Assessment              Vision       Perception     Praxis      Cognition Arousal/Alertness: Awake/alert Behavior During Therapy: Flat affect Overall Cognitive Status: Impaired/Different from baseline Area of Impairment: Safety/judgement, Awareness, Problem solving, Attention, Orientation                 Orientation Level: Disoriented to, Situation Current Attention Level: Focused     Safety/Judgement: Decreased awareness of deficits, Decreased awareness of  safety Awareness: Emergent Problem Solving: Slow processing, Difficulty sequencing General Comments: Pt with slow processing throughout. Pt following all one step commands with increased time. Easily distractable when any provider would enter in during OT session        Exercises      Shoulder Instructions       General Comments VSS on RA    Pertinent Vitals/ Pain       Pain Assessment Pain Assessment: Faces Faces Pain Scale: Hurts a little bit Pain Descriptors / Indicators:  Discomfort Pain Intervention(s): Limited activity within patient's tolerance, Monitored during session  Home Living                                          Prior Functioning/Environment              Frequency  Min 2X/week        Progress Toward Goals  OT Goals(current goals can now be found in the care plan section)  Progress towards OT goals: Progressing toward goals  Acute Rehab OT Goals Patient Stated Goal: to get back to my life OT Goal Formulation: With patient Time For Goal Achievement: 11/05/22 Potential to Achieve Goals: Good  Plan Discharge plan remains appropriate    Co-evaluation                 AM-PAC OT "6 Clicks" Daily Activity     Outcome Measure   Help from another person eating meals?: A Little Help from another person taking care of personal grooming?: A Lot Help from another person toileting, which includes using toliet, bedpan, or urinal?: A Little Help from another person bathing (including washing, rinsing, drying)?: A Lot Help from another person to put on and taking off regular upper body clothing?: A Lot Help from another person to put on and taking off regular lower body clothing?: A Lot 6 Click Score: 14    End of Session    OT Visit Diagnosis: Unsteadiness on feet (R26.81);Other abnormalities of gait and mobility (R26.89);Muscle weakness (generalized) (M62.81);Other symptoms and signs involving cognitive function;Hemiplegia and hemiparesis Hemiplegia - Right/Left: Right Hemiplegia - dominant/non-dominant: Non-Dominant Hemiplegia - caused by: Cerebral infarction   Activity Tolerance Patient tolerated treatment well   Patient Left in chair;with call bell/phone within reach;with chair alarm set (with SLP in room)   Nurse Communication Mobility status        Time: 0867-6195 OT Time Calculation (min): 14 min  Charges: OT General Charges $OT Visit: 1 Visit OT Treatments $Self Care/Home Management :  8-22 mins  Corinne Ports E. Sarena Jezek, OTR/L Acute Rehabilitation Services [phone]   Ascencion Dike 10/26/2022, 12:58 PM

## 2022-10-26 NOTE — Progress Notes (Signed)
Physical Therapy Treatment Patient Details Name: Daniel [name] MRN: [id number] DOB: [birth year] Today's Date: 10/26/2022   History of Present Illness 76 y.o. male presents to [facility] hospital on 10/20/2022 with R facial droop and slurred speech. MRI demonstrates large L MCA CVA and bilateral SDH, R>L. PMH includes HTN, HLD, CVA w/ R hemiparesis, CKD IV, schizophrenia, BPH.    PT Comments    Pt progressing towards acute goals, with good effort needing less physical assist for bed mobility and transfers this date. Pt able to come to sitting EOB with mod assist to elevate trunk and scoot to EOB with pt able to maintain static sitting with intermittent min assist as pt with tendency for R lateral lean, pt able to correct with verbal cues. Pt able to squat pivot to recliner with mod assist +1 to R. Pt with continued flexion of R knee/hip and with adductor tone noted, however pt able to demonstrate active abduction of RLE this session. Current plan remains appropriate to address deficits and maximize functional independence and decrease caregiver burden. Pt continues to benefit from skilled PT services to progress toward functional mobility goals.    Recommendations for follow up therapy are one component of a multi-disciplinary discharge planning process, led by the attending physician.  Recommendations may be updated based on patient status, additional functional criteria and insurance authorization.  Follow Up Recommendations  Acute inpatient rehab (3hours/day)     Assistance Recommended at Discharge Intermittent Supervision/Assistance  Patient can return home with the following Two people to help with walking and/or transfers;Two people to help with bathing/dressing/bathroom;Assistance with cooking/housework;Direct supervision/assist for medications management;Direct supervision/assist for financial management;Assist for transportation;Help with stairs or ramp for entrance   Equipment  Recommendations  BSC/3in1    Recommendations for Other Services       Precautions / Restrictions Precautions Precautions: Fall Restrictions Weight Bearing Restrictions: No     Mobility  Bed Mobility Overal bed mobility: Needs Assistance Bed Mobility: Supine to Sit     Supine to sit: Mod assist     General bed mobility comments: Mod A for balance upon rise and to scoot hips toward EOB to achieve feet on floor. Cues to assist RLE with LLE.    Transfers Overall transfer level: Needs assistance Equipment used: 1 person hand held assist Transfers: Bed to chair/wheelchair/BSC       Squat pivot transfers: Mod assist     General transfer comment: mod a to squat pivot toward R    Ambulation/Gait                   Stairs             Wheelchair Mobility    Modified Rankin (Stroke Patients Only) Modified Rankin (Stroke Patients Only) Pre-Morbid Rankin Score: Severe disability Modified Rankin: Severe disability     Balance Overall balance assessment: Needs assistance Sitting-balance support: No upper extremity supported, Feet supported Sitting balance-Leahy Scale: Fair     Standing balance support: Single extremity supported, Reliant on assistive device for balance Standing balance-Leahy Scale: Poor Standing balance comment: unable to come to fully upright position.                            Cognition Arousal/Alertness: Awake/alert Behavior During Therapy: WFL for tasks assessed/performed Overall Cognitive Status: Impaired/Different from baseline Area of Impairment: Safety/judgement, Awareness, Problem solving  Orientation Level: Disoriented to, Situation       Safety/Judgement: Decreased awareness of deficits, Decreased awareness of safety Awareness: Emergent Problem Solving: Slow processing, Difficulty sequencing General Comments: Pt with slow processing throughout. Pt following all one step commands with  increased time.        Exercises General Exercises - Lower Extremity Ankle Circles/Pumps: AROM, AAROM, Right, Left, 10 reps, Seated Hip ABduction/ADduction: AROM, AAROM, Right, Left, 10 reps, Seated Hip Flexion/Marching: AROM, AAROM, Right, Left, 10 reps, Seated    General Comments General comments (skin integrity, edema, etc.): VSS on RA      Pertinent Vitals/Pain Pain Assessment Pain Assessment: Faces Faces Pain Scale: No hurt Pain Intervention(s): Monitored during session    Home Living                          Prior Function            PT Goals (current goals can now be found in the care plan section) Acute Rehab PT Goals PT Goal Formulation: With patient Time For Goal Achievement: 11/04/22    Frequency    Min 4X/week      PT Plan      Co-evaluation              AM-PAC PT "6 Clicks" Mobility   Outcome Measure  Help needed turning from your back to your side while in a flat bed without using bedrails?: A Lot Help needed moving from lying on your back to sitting on the side of a flat bed without using bedrails?: A Lot Help needed moving to and from a bed to a chair (including a wheelchair)?: A Lot Help needed standing up from a chair using your arms (e.g., wheelchair or bedside chair)?: A Lot Help needed to walk in hospital room?: Total Help needed climbing 3-5 steps with a railing? : Total 6 Click Score: 10    End of Session Equipment Utilized During Treatment: Gait belt Activity Tolerance: Patient tolerated treatment well Patient left: in chair;with call bell/phone within reach;with chair alarm set Nurse Communication: Mobility status PT Visit Diagnosis: Other abnormalities of gait and mobility (R26.89);Other symptoms and signs involving the nervous system (R29.898);Hemiplegia and hemiparesis Hemiplegia - Right/Left: Right Hemiplegia - dominant/non-dominant: Non-dominant Hemiplegia - caused by: Cerebral infarction     Time:  1040-1101 PT Time Calculation (min) (ACUTE ONLY): 21 min  Charges:  $Therapeutic Activity: 8-22 mins                     Zyere Jiminez R. PTA Acute Rehabilitation Services Office: [place] 10/26/2022, 11:16 AM

## 2022-10-26 NOTE — Progress Notes (Signed)
Inpatient Rehab Admissions Coordinator:     Pt. Appears medically stable for CIR in the next few days. I will open a case with his insurance once PT/OT get their notes in and pursue for admit pending insurance auth.   Clemens Catholic, [place], [place] Admissions Coordinator  [phone] ([place]) [phone] (office)

## 2022-10-26 NOTE — Progress Notes (Signed)
CIR to begin insurance authorization for admission after therapies see today.  TOC following.

## 2022-10-26 NOTE — Progress Notes (Signed)
PROGRESS NOTE    Daniel [name]  KKX:[id number] DOB: 1946-03-11 DOA: 10/20/2022 PCP: Vivi Barrack, MD   Brief Narrative: 76 year old with past medical history significant for hypertension, hyperlipidemia, stroke with residual right hemiparesis, CKD stage IIIb-4, schizophrenia, BPH presented to ED via EMS with right-sided facial droop, slurred speech.LKW 3 days ago.  Potassium 2.9, bicarb 19, anion gap 13 creatinine 5.8 (baseline 2.0-2.3 in 2021).  MRI of brain showing large area of acute ischemia within the anterior left MCA territory.  Bilateral subdural hematoma, Right greater than left.   Assessment & Plan:   Principal Problem:   Acute CVA (cerebrovascular accident) ([place]) Active Problems:   HTN (hypertension)   Hyperlipidemia   Schizophrenia ([place])   Acute kidney injury superimposed on chronic kidney disease (HCC)   Subdural hematoma (HCC)   Metabolic acidosis   Hypokalemia   Hypocalcemia   BPH (benign prostatic hyperplasia)  1-Acute Ischemic Stroke:  MRI brain: Large area of acute ischemia within the anterior left MCA territory.  No hemorrhage or mass effect.  Bilateral subdural hematoma right greater than left. MR Angio: Occlusion of the left ICA proximal to the skull base.  Diminished flow related enhancement in the left MCA.  ECHO: EF 65 %.  Grade 1 diastolic dysfunction. Carotid doppler: Right Carotid: Velocities in the right ICA are consistent with a 40-59% stenosis. The ECA appears >50% stenosed. Left Carotid: Evidence consistent with a total occlusion of the left ICA.  The ECA appears >50% stenosed. Needs to follow up with vascular out patient per neurology  LDL; 65, A1c: 5.0. CT head; subdural stable. Started on aspirin 81 mg daily on 11/17. Neuro deficit stable.   Bilateral subdural hematoma:  Neurosurgery Dr. Marcello Moores consulted.  No neurosurgical intervention or follow-up required.  Anticoagulation will be relatively contraindicated in the setting. CT head stable  on 11/17.  Monitor on baby aspirin.   AKI on CKD stage IIIb-IV:  Metabolic acidosis in setting renal failure.  In setting hemodynamic, hypovolemia.  -Started oral Bicarb -Received Bicarb Gtt, now on NS>  -Renal US, negative for hydronephrosis.  -Nephrology consulted. Appreciate assistance.  -Cr trending down from 6.4--- 4.6--4.3  PNA, UTI;  Chest x ray: Atelectasis versus infiltrate LEFT base. Mild RIGHT upper lobe infiltrate. UA with more than 50 WBC Urine culture: 100,000 E coli. Pan sensitive.  Continue with IV ceftriaxone and Azithromycin. Day 4.   Hypokalemia: Resolved.   Hypocalcemia:  Started supplement.    Vitamin D deficiency. Started supplement.   HTN;  Permissive HTN Hold lisinopril in setting AKI.  BP goal 130-160 per neurology  Started on Norvasc, increased dose.  Start Hydralazine.   Hyperlipidemia:  Started lipitor.   Schizophrenia Not on meds.   BPH On flomax.   Anemia:  Prior hb 11--8 Suspect hemodilution anemia of chronic diseases.  Anemia panel. Will need IV iron when infection controlled. Start oral iron   Hypomagnesemia; Replaced.    Estimated body mass index is 21.52 kg/m as calculated from the following:   Height as of this encounter: 5\' 9"  (1.753 m).   Weight as of this encounter: 66.1 kg.   DVT prophylaxis: scd Code Status: Full code Family Communication:  Disposition Plan:  Status is: Inpatient Remains inpatient appropriate because: inpatient for stroke, subdural. AKI. CIR>     Consultants:  Neurology Neurosurgery Nephrology   Procedures:  ECHO  Antimicrobials:    Subjective: He denies pain, dyspnea. No new complaints.   Objective: Vitals:   10/26/22 0325 10/26/22 0450  10/26/22 0724 10/26/22 1111  BP: (!) 170/90 (!) 166/83 (!) 151/89 (!) 174/95  Pulse: 92 90 95 92  Resp: 17 17 18 19   Temp: 98.9 F (37.2 C)  98.2 F (36.8 C) 98.3 F (36.8 C)  TempSrc: Oral  Oral Oral  SpO2: 95%  100% 99%  Weight:       Height:        Intake/Output Summary (Last 24 hours) at 10/26/2022 1247 Last data filed at 10/26/2022 0121 Gross per 24 hour  Intake --  Output 1650 ml  Net -1650 ml    Filed Weights   10/20/22 1347 10/21/22 2055  Weight: 86.2 kg 66.1 kg    Examination:  General exam: NAD Respiratory system: CTA Cardiovascular system: S 1, S 2 RRR Gastrointestinal system: BS present, soft, nt Central nervous system: Alert, neuro deficit stable. . Right side weakness, right arm 1/5  dysarthric speech. LE 4/5 Extremities: Right side weakness, right arm 1/5  dysarthric speech. LE 4/5    Data Reviewed: I have personally reviewed following labs and imaging studies  CBC: Recent Labs  Lab 10/20/22 1453 10/20/22 1501 10/21/22 0435 10/22/22 0427 10/23/22 0318 10/25/22 0415  WBC 6.1  --  7.0 7.0 5.6 6.3  NEUTROABS 4.8  --   --   --   --   --   HGB 11.8* 12.6* 10.0* 8.6* 8.5* 8.5*  HCT 36.8* 37.0* 29.4* 25.0* 24.3* 25.3*  MCV 90.0  --  89.6 86.8 85.3 89.4  PLT 249  --  212 210 230 169    Basic Metabolic Panel: Recent Labs  Lab 10/21/22 1456 10/22/22 0427 10/23/22 0324 10/24/22 0405 10/25/22 0415 10/26/22 0348  NA  --  138 141 141 141 140  K  --  3.0* 4.4 4.7 4.5 4.6  CL  --  111 112* 107 107 108  CO2  --  19* 22 22 22  21*  GLUCOSE  --  104* 93 97 99 92  BUN  --  40* 38* 36* 33* 31*  CREATININE  --  5.16* 4.72* 4.65* 4.38* 4.38*  CALCIUM  --  6.4* 6.6* 7.1* 7.4* 7.8*  MG 1.5*  --  1.6*  --  1.6* 2.1  PHOS  --   --  1.3* 2.3* 2.9 3.1    GFR: Estimated Creatinine Clearance: 13.4 mL/min (A) (by C-G formula based on SCr of 4.38 mg/dL (H)). Liver Function Tests: Recent Labs  Lab 10/20/22 1453 10/22/22 0427 10/23/22 0324 10/24/22 0405 10/25/22 0415 10/26/22 0348  AST 26  --   --   --   --   --   ALT 19  --   --   --   --   --   ALKPHOS 81  --   --   --   --   --   BILITOT 0.5  --   --   --   --   --   PROT 7.2  --   --   --   --   --   ALBUMIN 3.7 2.7* 2.6* 2.9* 2.6*  2.6*    No results for input(s): "LIPASE", "AMYLASE" in the last 168 hours. No results for input(s): "AMMONIA" in the last 168 hours. Coagulation Profile: Recent Labs  Lab 10/20/22 1453  INR 1.1    Cardiac Enzymes: No results for input(s): "CKTOTAL", "CKMB", "CKMBINDEX", "TROPONINI" in the last 168 hours. BNP (last 3 results) No results for input(s): "PROBNP" in the last 8760 hours. HbA1C: No results  for input(s): "HGBA1C" in the last 72 hours.  CBG: No results for input(s): "GLUCAP" in the last 168 hours. Lipid Profile: No results for input(s): "CHOL", "HDL", "LDLCALC", "TRIG", "CHOLHDL", "LDLDIRECT" in the last 72 hours.  Thyroid Function Tests: No results for input(s): "TSH", "T4TOTAL", "FREET4", "T3FREE", "THYROIDAB" in the last 72 hours. Anemia Panel: No results for input(s): "VITAMINB12", "FOLATE", "FERRITIN", "TIBC", "IRON", "RETICCTPCT" in the last 72 hours.  Sepsis Labs: No results for input(s): "PROCALCITON", "LATICACIDVEN" in the last 168 hours.  Recent Results (from the past 240 hour(s))  Urine Culture     Status: Abnormal   Collection Time: 10/23/22  7:46 AM   Specimen: Urine, Clean Catch  Result Value Ref Range Status   Specimen Description URINE, CLEAN CATCH  Final   Special Requests   Final    NONE Performed at [facility], [address] [place], [place] [address]    Culture >=100,000 COLONIES/mL ESCHERICHIA COLI (A)  Final   Report Status 10/25/2022 FINAL  Final   Organism ID, Bacteria ESCHERICHIA COLI (A)  Final      Susceptibility   Escherichia coli - MIC*    AMPICILLIN <=2 SENSITIVE Sensitive     CEFAZOLIN <=4 SENSITIVE Sensitive     CEFEPIME <=0.12 SENSITIVE Sensitive     CEFTRIAXONE <=0.25 SENSITIVE Sensitive     CIPROFLOXACIN <=0.25 SENSITIVE Sensitive     GENTAMICIN <=1 SENSITIVE Sensitive     IMIPENEM <=0.25 SENSITIVE Sensitive     NITROFURANTOIN <=16 SENSITIVE Sensitive     TRIMETH/SULFA <=20 SENSITIVE Sensitive      AMPICILLIN/SULBACTAM <=2 SENSITIVE Sensitive     PIP/TAZO <=4 SENSITIVE Sensitive     * >=100,000 COLONIES/mL ESCHERICHIA COLI         Radiology Studies: No results found.      Scheduled Meds:   stroke: early stages of recovery book   Does not apply Once   amLODipine  10 mg Oral Daily   aspirin EC  81 mg Oral Daily   atorvastatin  40 mg Oral Daily   calcium carbonate  1 tablet Oral BID WC   ferrous sulfate  325 mg Oral Q breakfast   folic acid  1 mg Oral Daily   heparin injection (subcutaneous)  5,000 Units Subcutaneous Q8H   magnesium oxide  200 mg Oral BID   nystatin  5 mL Oral QID   senna-docusate  1 tablet Oral BID   sodium bicarbonate  650 mg Oral TID   tamsulosin  0.4 mg Oral Daily   Vitamin D (Ergocalciferol)  50,000 Units Oral Q7 days   Continuous Infusions:  sodium chloride 75 mL/hr at 10/26/22 0720   azithromycin 500 mg (10/26/22 1204)   cefTRIAXone (ROCEPHIN)  IV 2 g (10/26/22 1032)     LOS: 6 days    Time spent: 35 minutes    Conroy Goracke A Sammantha Mehlhaff, MD [facility]   If 7PM-7AM, please contact night-coverage [email or web address]  10/26/2022, 12:47 PM

## 2022-10-26 NOTE — Progress Notes (Addendum)
Speech Language Pathology Treatment: Cognitive-Linquistic  Patient Details Name: Daniel [name] MRN: [id number] DOB: [birth year] Today's Date: 10/26/2022 Time: 4431-5400 SLP Time Calculation (min) (ACUTE ONLY): 21 min  Assessment / Plan / Recommendation Clinical Impression  Pt seen for speech/cog-linguistic tx with pt able to answer simple biographical questions given min-mod dysarthria depending on length of utterance.  Slow, precise articulation and adequate breath support techniques provided during communicative attempts with phrases-short sentences with intelligibility improved to 75% with techniques and cueing in place.  Without cueing, pt's intelligibility decreased to 50% during simple phrases-sentence completion.  Pt oriented to self,place, partial situation ("My speech was not good") and partial date (knew year and day, but not month) when asked this session, which indicates improvement from prior session.  New CVA may exacerbate any baseline cognitive impairment associated with mental illness noted in chart review, so ongoing cognitive assessment beneficial.  Pt able to answer biographical questions and follow simple 1-2 step functional directives during the session.  Pt denies dysphagia and he passed Yale swallow screen upon admission, but R labial loss of secretions noted during conversation and R hemiparesis observed which could potentially impact swallowing paired with baseline cognitive impairment/new CVA.  ST will continue to f/u for speech/lang/cog needs in acute setting.     HPI HPI: 76 y.o. male [facility] to [facility] hospital on [facility]22 with R facial droop and slurred speech. MRI demonstrates large L MCA CVA and bilateral SDH, R>L. PMH includes HTN, HLD, CVA w/ R hemiparesis, CKD IV, schizophrenia, BPH;SLE completed with dysarthria/cognitive vs linguistic deficits noted.  ST f/u for speech/cognitive tx.      SLP Plan  Continue with current plan of care      Recommendations for  follow up therapy are one component of a multi-disciplinary discharge planning process, led by the attending physician.  Recommendations may be updated based on patient status, additional functional criteria and insurance authorization.    Recommendations   CIR (Inpatient Rehab)                General recommendations: Other(comment) (TBD) Follow Up Recommendations: Follow physician's recommendations for discharge plan and follow up therapies Assistance recommended at discharge: Frequent or constant Supervision/Assistance SLP Visit Diagnosis: Dysarthria and anarthria (R47.1);Cognitive communication deficit (Q67.619) Plan: Continue with current plan of care           Elvina Sidle, M.S., CCC-SLP  10/26/2022, 12:15 PM

## 2022-10-27 ENCOUNTER — Inpatient Hospital Stay (HOSPITAL_COMMUNITY)
Admission: RE | Admit: 2022-10-27 | Discharge: 2022-11-12 | DRG: 056 | Disposition: A | Discharge status: Skilled Nursing Facility | Payer: Medicare PPO | Source: Intra-hospital | Attending: Physical Medicine & Rehabilitation | Admitting: Physical Medicine & Rehabilitation

## 2022-10-27 ENCOUNTER — Encounter (HOSPITAL_COMMUNITY): Payer: Self-pay | Admitting: Physical Medicine & Rehabilitation

## 2022-10-27 DIAGNOSIS — N400 Enlarged prostate without lower urinary tract symptoms: Secondary | ICD-10-CM | POA: Diagnosis present

## 2022-10-27 DIAGNOSIS — D649 Anemia, unspecified: Secondary | ICD-10-CM | POA: Diagnosis not present

## 2022-10-27 DIAGNOSIS — R531 Weakness: Secondary | ICD-10-CM | POA: Diagnosis not present

## 2022-10-27 DIAGNOSIS — E872 Acidosis, unspecified: Secondary | ICD-10-CM | POA: Diagnosis present

## 2022-10-27 DIAGNOSIS — E559 Vitamin D deficiency, unspecified: Secondary | ICD-10-CM | POA: Insufficient documentation

## 2022-10-27 DIAGNOSIS — E785 Hyperlipidemia, unspecified: Secondary | ICD-10-CM | POA: Diagnosis present

## 2022-10-27 DIAGNOSIS — E876 Hypokalemia: Secondary | ICD-10-CM | POA: Diagnosis not present

## 2022-10-27 DIAGNOSIS — N184 Chronic kidney disease, stage 4 (severe): Secondary | ICD-10-CM | POA: Diagnosis not present

## 2022-10-27 DIAGNOSIS — I69392 Facial weakness following cerebral infarction: Secondary | ICD-10-CM | POA: Diagnosis not present

## 2022-10-27 DIAGNOSIS — S065XAS Traumatic subdural hemorrhage with loss of consciousness status unknown, sequela: Secondary | ICD-10-CM | POA: Diagnosis not present

## 2022-10-27 DIAGNOSIS — J189 Pneumonia, unspecified organism: Secondary | ICD-10-CM | POA: Diagnosis not present

## 2022-10-27 DIAGNOSIS — I63512 Cerebral infarction due to unspecified occlusion or stenosis of left middle cerebral artery: Secondary | ICD-10-CM | POA: Diagnosis not present

## 2022-10-27 DIAGNOSIS — F209 Schizophrenia, unspecified: Secondary | ICD-10-CM | POA: Diagnosis present

## 2022-10-27 DIAGNOSIS — Z7902 Long term (current) use of antithrombotics/antiplatelets: Secondary | ICD-10-CM

## 2022-10-27 DIAGNOSIS — I69353 Hemiplegia and hemiparesis following cerebral infarction affecting right non-dominant side: Secondary | ICD-10-CM | POA: Diagnosis not present

## 2022-10-27 DIAGNOSIS — B9620 Unspecified Escherichia coli [E. coli] as the cause of diseases classified elsewhere: Secondary | ICD-10-CM | POA: Diagnosis present

## 2022-10-27 DIAGNOSIS — I69351 Hemiplegia and hemiparesis following cerebral infarction affecting right dominant side: Secondary | ICD-10-CM | POA: Diagnosis present

## 2022-10-27 DIAGNOSIS — I10 Essential (primary) hypertension: Secondary | ICD-10-CM | POA: Diagnosis not present

## 2022-10-27 DIAGNOSIS — R4189 Other symptoms and signs involving cognitive functions and awareness: Secondary | ICD-10-CM | POA: Diagnosis present

## 2022-10-27 DIAGNOSIS — I739 Peripheral vascular disease, unspecified: Secondary | ICD-10-CM | POA: Diagnosis present

## 2022-10-27 DIAGNOSIS — R1310 Dysphagia, unspecified: Secondary | ICD-10-CM | POA: Diagnosis present

## 2022-10-27 DIAGNOSIS — N401 Enlarged prostate with lower urinary tract symptoms: Secondary | ICD-10-CM | POA: Diagnosis not present

## 2022-10-27 DIAGNOSIS — G8111 Spastic hemiplegia affecting right dominant side: Secondary | ICD-10-CM | POA: Diagnosis not present

## 2022-10-27 DIAGNOSIS — Z79899 Other long term (current) drug therapy: Secondary | ICD-10-CM | POA: Diagnosis not present

## 2022-10-27 DIAGNOSIS — N390 Urinary tract infection, site not specified: Secondary | ICD-10-CM | POA: Diagnosis present

## 2022-10-27 DIAGNOSIS — R296 Repeated falls: Secondary | ICD-10-CM | POA: Diagnosis present

## 2022-10-27 DIAGNOSIS — N179 Acute kidney failure, unspecified: Secondary | ICD-10-CM | POA: Diagnosis present

## 2022-10-27 DIAGNOSIS — I129 Hypertensive chronic kidney disease with stage 1 through stage 4 chronic kidney disease, or unspecified chronic kidney disease: Secondary | ICD-10-CM | POA: Diagnosis present

## 2022-10-27 DIAGNOSIS — D631 Anemia in chronic kidney disease: Secondary | ICD-10-CM | POA: Diagnosis not present

## 2022-10-27 DIAGNOSIS — I639 Cerebral infarction, unspecified: Secondary | ICD-10-CM | POA: Diagnosis not present

## 2022-10-27 DIAGNOSIS — R339 Retention of urine, unspecified: Secondary | ICD-10-CM | POA: Diagnosis present

## 2022-10-27 DIAGNOSIS — N1832 Chronic kidney disease, stage 3b: Secondary | ICD-10-CM | POA: Diagnosis not present

## 2022-10-27 DIAGNOSIS — I69322 Dysarthria following cerebral infarction: Secondary | ICD-10-CM

## 2022-10-27 DIAGNOSIS — Z87891 Personal history of nicotine dependence: Secondary | ICD-10-CM

## 2022-10-27 DIAGNOSIS — I69328 Other speech and language deficits following cerebral infarction: Secondary | ICD-10-CM | POA: Diagnosis not present

## 2022-10-27 DIAGNOSIS — R32 Unspecified urinary incontinence: Secondary | ICD-10-CM | POA: Diagnosis present

## 2022-10-27 DIAGNOSIS — R41841 Cognitive communication deficit: Secondary | ICD-10-CM | POA: Diagnosis present

## 2022-10-27 DIAGNOSIS — I69320 Aphasia following cerebral infarction: Secondary | ICD-10-CM | POA: Diagnosis not present

## 2022-10-27 DIAGNOSIS — Z823 Family history of stroke: Secondary | ICD-10-CM

## 2022-10-27 DIAGNOSIS — I6522 Occlusion and stenosis of left carotid artery: Secondary | ICD-10-CM | POA: Diagnosis present

## 2022-10-27 DIAGNOSIS — R159 Full incontinence of feces: Secondary | ICD-10-CM | POA: Diagnosis present

## 2022-10-27 DIAGNOSIS — Z7401 Bed confinement status: Secondary | ICD-10-CM | POA: Diagnosis not present

## 2022-10-27 DIAGNOSIS — M6281 Muscle weakness (generalized): Secondary | ICD-10-CM | POA: Diagnosis not present

## 2022-10-27 DIAGNOSIS — I69391 Dysphagia following cerebral infarction: Secondary | ICD-10-CM | POA: Diagnosis not present

## 2022-10-27 DIAGNOSIS — I69222 Dysarthria following other nontraumatic intracranial hemorrhage: Secondary | ICD-10-CM

## 2022-10-27 DIAGNOSIS — Z8249 Family history of ischemic heart disease and other diseases of the circulatory system: Secondary | ICD-10-CM | POA: Diagnosis not present

## 2022-10-27 DIAGNOSIS — I69959 Hemiplegia and hemiparesis following unspecified cerebrovascular disease affecting unspecified side: Secondary | ICD-10-CM

## 2022-10-27 LAB — RENAL FUNCTION PANEL
Albumin: 2.7 g/dL — ABNORMAL LOW (ref 3.5–5.0)
Anion gap: 10 (ref 5–15)
BUN: 27 mg/dL — ABNORMAL HIGH (ref 8–23)
CO2: 21 mmol/L — ABNORMAL LOW (ref 22–32)
Calcium: 8.1 mg/dL — ABNORMAL LOW (ref 8.9–10.3)
Chloride: 108 mmol/L (ref 98–111)
Creatinine, Ser: 4.27 mg/dL — ABNORMAL HIGH (ref 0.61–1.24)
GFR, Estimated: 14 mL/min — ABNORMAL LOW (ref >60)
Glucose, Bld: 96 mg/dL (ref 70–99)
Phosphorus: 3.2 mg/dL (ref 2.5–4.6)
Potassium: 4.3 mmol/L (ref 3.5–5.1)
Sodium: 139 mmol/L (ref 135–145)

## 2022-10-27 MED ORDER — VITAMIN D (ERGOCALCIFEROL) 1.25 MG (50000 UNIT) PO CAPS
50000.0000 [IU] | ORAL_CAPSULE | ORAL | Status: DC
  Administered 2022-10-28 – 2022-11-11 (×3): 50000 [IU] via ORAL
  Filled 2022-10-27 (×3): qty 1

## 2022-10-27 MED ORDER — FLEET ENEMA 7-19 GM/118ML RE ENEM: 1.0000 | ENEMA | Freq: Once | RECTAL | Status: DC | PRN

## 2022-10-27 MED ORDER — PROCHLORPERAZINE MALEATE 5 MG PO TABS: 5.0000 mg | ORAL_TABLET | Freq: Four times a day (QID) | ORAL | Status: DC | PRN

## 2022-10-27 MED ORDER — ASPIRIN 81 MG PO TBEC: 81.0000 mg | DELAYED_RELEASE_TABLET | Freq: Every day | ORAL | 12 refills | Status: AC

## 2022-10-27 MED ORDER — SENNOSIDES-DOCUSATE SODIUM 8.6-50 MG PO TABS
1.0000 | ORAL_TABLET | Freq: Two times a day (BID) | ORAL | Status: DC
  Administered 2022-10-27 – 2022-11-04 (×13): 1 via ORAL
  Filled 2022-10-27 (×14): qty 1

## 2022-10-27 MED ORDER — FERROUS SULFATE 325 (65 FE) MG PO TABS
325.0000 mg | ORAL_TABLET | Freq: Every day | ORAL | Status: DC
  Administered 2022-10-28 – 2022-11-12 (×16): 325 mg via ORAL
  Filled 2022-10-27 (×17): qty 1

## 2022-10-27 MED ORDER — VITAMIN D (ERGOCALCIFEROL) 1.25 MG (50000 UNIT) PO CAPS: 50000.0000 [IU] | ORAL_CAPSULE | ORAL | 0 refills | Status: DC

## 2022-10-27 MED ORDER — BISACODYL 10 MG RE SUPP: 10.0000 mg | Freq: Every day | RECTAL | Status: DC | PRN

## 2022-10-27 MED ORDER — ATORVASTATIN CALCIUM 40 MG PO TABS
40.0000 mg | ORAL_TABLET | Freq: Every day | ORAL | Status: DC
  Administered 2022-10-28 – 2022-11-12 (×16): 40 mg via ORAL
  Filled 2022-10-27 (×17): qty 1

## 2022-10-27 MED ORDER — FOLIC ACID 1 MG PO TABS
1.0000 mg | ORAL_TABLET | Freq: Every day | ORAL | Status: DC
  Administered 2022-10-28 – 2022-11-12 (×16): 1 mg via ORAL
  Filled 2022-10-27 (×17): qty 1

## 2022-10-27 MED ORDER — ALUM & MAG HYDROXIDE-SIMETH 200-200-20 MG/5ML PO SUSP: 30.0000 mL | ORAL | Status: DC | PRN

## 2022-10-27 MED ORDER — CALCIUM CARBONATE 1250 (500 CA) MG PO TABS
1.0000 | ORAL_TABLET | Freq: Two times a day (BID) | ORAL | Status: DC
  Administered 2022-10-28 – 2022-11-12 (×31): 1250 mg via ORAL
  Filled 2022-10-27 (×32): qty 1

## 2022-10-27 MED ORDER — GUAIFENESIN-DM 100-10 MG/5ML PO SYRP: 5.0000 mL | ORAL_SOLUTION | Freq: Four times a day (QID) | ORAL | Status: DC | PRN

## 2022-10-27 MED ORDER — SODIUM CHLORIDE 0.9 % IV SOLN
1.0000 g | INTRAVENOUS | Status: AC
  Administered 2022-10-28 – 2022-10-29 (×2): 1 g via INTRAVENOUS
  Filled 2022-10-27 (×3): qty 10

## 2022-10-27 MED ORDER — CALCIUM CARBONATE 1250 (500 CA) MG PO TABS: 1.0000 | ORAL_TABLET | Freq: Two times a day (BID) | ORAL | 0 refills | Status: AC

## 2022-10-27 MED ORDER — SODIUM BICARBONATE 650 MG PO TABS: 650.0000 mg | ORAL_TABLET | Freq: Three times a day (TID) | ORAL | 1 refills | Status: DC

## 2022-10-27 MED ORDER — HYDRALAZINE HCL 25 MG PO TABS: 25.0000 mg | ORAL_TABLET | Freq: Two times a day (BID) | ORAL | 0 refills | Status: DC

## 2022-10-27 MED ORDER — SODIUM CHLORIDE 0.9 % IV SOLN: INTRAVENOUS | Status: DC

## 2022-10-27 MED ORDER — DIPHENHYDRAMINE HCL 12.5 MG/5ML PO ELIX: 12.5000 mg | ORAL_SOLUTION | Freq: Four times a day (QID) | ORAL | Status: DC | PRN

## 2022-10-27 MED ORDER — PROCHLORPERAZINE 25 MG RE SUPP: 12.5000 mg | Freq: Four times a day (QID) | RECTAL | Status: DC | PRN

## 2022-10-27 MED ORDER — ASPIRIN 81 MG PO TBEC
81.0000 mg | DELAYED_RELEASE_TABLET | Freq: Every day | ORAL | Status: DC
  Administered 2022-10-28 – 2022-11-12 (×16): 81 mg via ORAL
  Filled 2022-10-27 (×16): qty 1

## 2022-10-27 MED ORDER — ORAL CARE MOUTH RINSE: 15.0000 mL | OROMUCOSAL | Status: DC | PRN

## 2022-10-27 MED ORDER — ACETAMINOPHEN 325 MG PO TABS
325.0000 mg | ORAL_TABLET | ORAL | Status: DC | PRN
  Administered 2022-11-04 – 2022-11-09 (×6): 650 mg via ORAL
  Filled 2022-10-27 (×7): qty 2

## 2022-10-27 MED ORDER — HYDRALAZINE HCL 10 MG PO TABS: 10.0000 mg | ORAL_TABLET | Freq: Four times a day (QID) | ORAL | Status: DC | PRN

## 2022-10-27 MED ORDER — HYDRALAZINE HCL 25 MG PO TABS
25.0000 mg | ORAL_TABLET | Freq: Two times a day (BID) | ORAL | Status: DC
  Administered 2022-10-27 – 2022-10-29 (×4): 25 mg via ORAL
  Filled 2022-10-27 (×4): qty 1

## 2022-10-27 MED ORDER — POLYETHYLENE GLYCOL 3350 17 G PO PACK: 17.0000 g | PACK | Freq: Every day | ORAL | Status: DC | PRN

## 2022-10-27 MED ORDER — PROCHLORPERAZINE EDISYLATE 10 MG/2ML IJ SOLN: 5.0000 mg | Freq: Four times a day (QID) | INTRAMUSCULAR | Status: DC | PRN

## 2022-10-27 MED ORDER — FERROUS SULFATE 325 (65 FE) MG PO TABS: 325.0000 mg | ORAL_TABLET | Freq: Every day | ORAL | 3 refills | Status: DC

## 2022-10-27 MED ORDER — MAGNESIUM OXIDE -MG SUPPLEMENT 400 (240 MG) MG PO TABS: 200.0000 mg | ORAL_TABLET | Freq: Two times a day (BID) | ORAL | 0 refills | Status: DC

## 2022-10-27 MED ORDER — TRAZODONE HCL 50 MG PO TABS
25.0000 mg | ORAL_TABLET | Freq: Every evening | ORAL | Status: DC | PRN
  Administered 2022-10-27 – 2022-11-09 (×5): 50 mg via ORAL
  Filled 2022-10-27 (×5): qty 1

## 2022-10-27 MED ORDER — MAGNESIUM OXIDE -MG SUPPLEMENT 400 (240 MG) MG PO TABS
200.0000 mg | ORAL_TABLET | Freq: Two times a day (BID) | ORAL | Status: AC
  Administered 2022-10-27: 200 mg via ORAL
  Filled 2022-10-27: qty 1

## 2022-10-27 MED ORDER — TAMSULOSIN HCL 0.4 MG PO CAPS
0.4000 mg | ORAL_CAPSULE | Freq: Every day | ORAL | Status: DC
  Administered 2022-10-28 – 2022-11-12 (×16): 0.4 mg via ORAL
  Filled 2022-10-27 (×16): qty 1

## 2022-10-27 MED ORDER — ENOXAPARIN SODIUM 30 MG/0.3ML IJ SOSY
30.0000 mg | PREFILLED_SYRINGE | Freq: Every day | INTRAMUSCULAR | Status: DC
  Administered 2022-10-27 – 2022-11-11 (×16): 30 mg via SUBCUTANEOUS
  Filled 2022-10-27 (×16): qty 0.3

## 2022-10-27 MED ORDER — SENNOSIDES-DOCUSATE SODIUM 8.6-50 MG PO TABS: 1.0000 | ORAL_TABLET | Freq: Two times a day (BID) | ORAL | 0 refills | Status: DC

## 2022-10-27 MED ORDER — SODIUM BICARBONATE 650 MG PO TABS
650.0000 mg | ORAL_TABLET | Freq: Three times a day (TID) | ORAL | Status: DC
  Administered 2022-10-27 – 2022-11-09 (×38): 650 mg via ORAL
  Filled 2022-10-27 (×39): qty 1

## 2022-10-27 MED ORDER — AMLODIPINE BESYLATE 10 MG PO TABS
10.0000 mg | ORAL_TABLET | Freq: Every day | ORAL | Status: DC
  Administered 2022-10-28 – 2022-11-12 (×16): 10 mg via ORAL
  Filled 2022-10-27 (×17): qty 1

## 2022-10-27 MED ORDER — NYSTATIN 100000 UNIT/ML MT SUSP
5.0000 mL | Freq: Four times a day (QID) | OROMUCOSAL | Status: DC
  Administered 2022-10-27 – 2022-11-12 (×61): 500000 [IU] via ORAL
  Filled 2022-10-27 (×57): qty 5

## 2022-10-27 NOTE — Progress Notes (Signed)
Inpatient Rehabilitation Admission Medication Review by a Pharmacist  A complete drug regimen review was completed for this patient to identify any potential clinically significant medication issues.  High Risk Drug Classes Is patient taking? Indication by Medication  Antipsychotic Yes Prochlorperazine - Nausea  Anticoagulant Yes Enoxaparin - VTE prophylaxis  Antibiotic No   Opioid No   Antiplatelet Yes ASA - CVA  Hypoglycemics/insulin No   Vasoactive Medication Yes, as an intravenous medication Yes, PO as well Amlodipine, Hydralazine - HTN  Chemotherapy No   Other Yes Atorvastatin - HLD FeSulfate - anemia Folic Acid - supplement Nystatin - thrush / mouth pain Sodium Bicarb - metabolic acidosis in renal disease Tamsulosin - urinary retention Trazodone - insomnia Vitamin D - supplement     Type of Medication Issue Identified Description of Issue Recommendation(s)  Drug Interaction(s) (clinically significant)     Duplicate Therapy     Allergy     No Medication Administration End Date     Incorrect Dose     Additional Drug Therapy Needed     Significant med changes from prior encounter (inform family/care partners about these prior to discharge).    Other  Consider changing Enoxaparin to SQ heparin for VTE ppx in patient with reduced CrCl.   Consider discontinuing Phos and Alum based PRN meds (Fleets enema and Maalox) based on renal function.     Clinically significant medication issues were identified that warrant physician communication and completion of prescribed/recommended actions by midnight of the next day:  No  Name of provider notified for urgent issues identified:   Provider Method of Notification:     Pharmacist comments:  Consider changing Enoxaparin to SQ heparin for VTE ppx in patient with reduced CrCl.   Consider discontinuing Phos and Alum based PRN meds (Fleets enema and Maalox) based on renal function.  Time spent performing this drug regimen  review (minutes):  20 minutes   Reason Helzer, Rocky Crafts 10/27/2022 5:43 PM

## 2022-10-27 NOTE — Progress Notes (Signed)
Inpatient Rehab Admissions Coordinator:    I have a CIR bed and can admit this Pt. To rehab today. RN may call report to [phone]  Dutch Ing, MS, CCC-SLP Rehab Admissions Coordinator  [phone] (celll) [phone] (office)  

## 2022-10-27 NOTE — H&P (Signed)
Physical Medicine and Rehabilitation Admission H&P    Chief Complaint  Patient presents with   Functional deficits due to stroke.     HPI: Daniel [name]. Daniel [name] is a 76 year old  L handed male with history of HTN, hyperlipidemia, stroke w/residual R-HP, CKD-baseline SCr-2.0-2.3 range, question hx of schizophrenia, BPH who was admitted on 10/20/22 with right facial droop and slurred speech. He was found to have acute on chronic renal failure with BUN 41, SCr 5.8, bicarb 19 with mild metabolic acidosis as well as hypokalemia with K-2.9. patient reported multiple falls and worsening of weakness 2 days PTA. MRI brain done revealing acute ischemia in L-MCA territory and R>L SDH, generalized volume loss and encephalomalacia at prior L-ACA infarct. MRA showed occlusion of left ICA proximal to skull base with diminished flow enhancement in L-MCA. Carotid dopplers showed total occlusion of L-ICA, and bilateral ECA with > 50% stenosis.   Dr. Curly Shores felt that stroke was due to carotid stenosis which had progressed to total occlusion. Dr. Marcello Moores consulted and felt that no surgical intervention or follow up needed and recommended avoiding  DAPT and ASA alone likely safe. Dr. Erlinda [name] recommended repeat CT head which was stable and ASA added on 11/17.    2 D echo showed EF 60-65% with mild LVH. Renal ultrasound showed medical renal disease with minimal to mild interval volume loss of both kidneys. Patient treated with bicarb, potasium supplemented and foley placed with 500 cc urine return.  Dr. Carolin [name] consulted and recommended fluid bolus as patient looked dry followed by IV bicarb and monitoring magnesium level.  He was treated with Zithromax for RML infiltrate concerning for PNA and as urine culture 11/17 showed >100,000 colonies of Ecoli ws started on IV ceftriaxone on 11/18. Intermittent fevers are resolving and  Hypocalcemia corrects to near normal and phosphate level normal. SCr improving and nephrology recommends  continuing IVF till intake stable and consider stopping Sodium bicarb if serum bicarb> 24,   Pt denies pain except "abd pain"- but just had LBM "just now"-  Has condom catheter-   Review of Systems  Unable to perform ROS: Language     Past Medical History:  Diagnosis Date   Hypertension    Psychosis ([place])    Schizophrenia ([place]) 1974   Stroke ([place]) 2010   Right Hemiparesis    Past Surgical History:  Procedure Laterality Date   FEMUR IM NAIL Right 12/12/2019   Procedure: INTRAMEDULLARY (IM) NAIL FEMORAL;  Surgeon: Renette Butters, MD;  Location: [facility];  Service: Orthopedics;  Laterality: Right;    Family History  Problem Relation Age of Onset   Hypertension Mother    CVA Sister    Breast cancer Sister    Prostate cancer Brother     Social History:  reports that he has quit smoking. His smoking use included cigarettes. He smoked an average of .1 packs per day. He has never used smokeless tobacco. He reports that he does not currently use alcohol. He reports that he does not currently use drugs.   Allergies: No Known Allergies   Medications Prior to Admission  Medication Sig Dispense Refill   amLODipine (NORVASC) 10 MG tablet TAKE 1 TABLET BY MOUTH EVERY DAY (Patient taking differently: Take 10 mg by mouth daily.) 30 tablet 0   atorvastatin (LIPITOR) 40 MG tablet TAKE 1 TABLET BY MOUTH EVERY DAY (Patient taking differently: Take 40 mg by mouth daily.) 90 tablet 3   clopidogrel (PLAVIX) 75 MG tablet  TAKE 1 TABLET BY MOUTH EVERY DAY (Patient taking differently: Take 75 mg by mouth daily.) 30 tablet 0   Emollient (EUCERIN) lotion Apply 1 Application topically as needed for dry skin.     folic acid (FOLVITE) 1 MG tablet TAKE 1 TABLET BY MOUTH EVERY DAY (Patient taking differently: Take 1 mg by mouth daily.) 30 tablet 0   lisinopril (ZESTRIL) 20 MG tablet TAKE 1 TABLET BY MOUTH EVERY DAY (Patient taking differently: Take 20 mg by mouth daily.) 30 tablet 0   metoprolol tartrate  (LOPRESSOR) 100 MG tablet TAKE 1 TABLET BY MOUTH TWICE A DAY (Patient taking differently: Take 100 mg by mouth 2 (two) times daily.) 60 tablet 0   tamsulosin (FLOMAX) 0.4 MG CAPS capsule TAKE 1 CAPSULE BY MOUTH EVERY DAY (Patient taking differently: Take 0.4 mg by mouth daily.) 30 capsule 0      Home: Home Living Family/patient expects to be discharged to:: Private residence Living Arrangements: Other relatives (sister) Available Help at Discharge: Family, Available 24 hours/day Type of Home: House Home Access: Ramped entrance Home Layout: One level Bathroom Shower/Tub: Chiropodist: Standard Bathroom Accessibility: Yes Home Equipment: Conservation officer, nature (2 wheels), Wheelchair - manual  Lives With: Other (Comment) (sister)   Functional History: Prior Function Prior Level of Function : Needs assist Mobility Comments: pt reports performing stand pivot transfers to wheelchair independently, mobilizing in wheelchair around the home ADLs Comments: assistance for IADLs, reports bathing, dressing, independence with ADLs. Sister's friend occasionally helps with cooking/cleaning, etc  Functional Status:  Mobility: Bed Mobility Overal bed mobility: Needs Assistance Bed Mobility: Supine to Sit Supine to sit: Mod assist Sit to supine: Mod assist General bed mobility comments: up in recliner upon OT entry Transfers Overall transfer level: Needs assistance Equipment used: 1 person hand held assist Transfers: Bed to chair/wheelchair/BSC Sit to Stand: Max assist, From elevated surface Bed to/from chair/wheelchair/BSC transfer type:: Squat pivot Squat pivot transfers: Mod assist General transfer comment: mod a to squat pivot toward R      ADL: ADL Overall ADL's : Needs assistance/impaired Eating/Feeding: Sitting, Minimal assistance Eating/Feeding Details (indicate cue type and reason): unable to cut foods or open packages at this time. Grooming: Wash/dry hands,  Wash/dry face, Oral care, Moderate assistance Grooming Details (indicate cue type and reason): Mod A, only engaging with LUE Upper Body Bathing: Minimal assistance, Sitting Lower Body Bathing: Moderate assistance, Sit to/from stand Upper Body Dressing : Moderate assistance, Sitting Lower Body Dressing: Min guard, Sitting/lateral leans, Moderate assistance Lower Body Dressing Details (indicate cue type and reason): Min guard A in recliner to don socks; would require mod A to stand and pull up pants Toilet Transfer: Minimal assistance, Squat-pivot, BSC/3in1, +2 for physical assistance Toilet Transfer Details (indicate cue type and reason): Min A for squat pivot Toileting- Clothing Manipulation and Hygiene: Min guard, Sitting/lateral lean Functional mobility during ADLs: Moderate assistance, +2 for safety/equipment, Cueing for safety, Cueing for sequencing, +2 for physical assistance General ADL Comments: Session focus on seated ADLs as patient had just transitioned to chair with PTA  Cognition: Cognition Overall Cognitive Status: Impaired/Different from baseline Arousal/Alertness: Awake/alert Orientation Level: Oriented X4 Year:  (2040) Month: November Day of Week: Incorrect Attention: Sustained Sustained Attention: Impaired Sustained Attention Impairment: Verbal basic Memory: Impaired Memory Impairment: Storage deficit, Decreased recall of new information, Retrieval deficit Awareness: Impaired Awareness Impairment: Intellectual impairment, Emergent impairment Cognition Arousal/Alertness: Awake/alert Behavior During Therapy: Flat affect Overall Cognitive Status: Impaired/Different from baseline Area of Impairment: Safety/judgement, Awareness,  Problem solving, Attention, Orientation Orientation Level: Disoriented to, Situation Current Attention Level: Focused Safety/Judgement: Decreased awareness of deficits, Decreased awareness of safety Awareness: Emergent Problem Solving: Slow  processing, Difficulty sequencing General Comments: Pt with slow processing throughout. Pt following all one step commands with increased time. Easily distractable when any provider would enter in during OT session   Blood pressure 139/82, pulse 91, temperature 99 F (37.2 C), temperature source Oral, resp. rate 16, height 5\' 9"  (1.753 m), weight 66.1 kg, SpO2 100 %. Physical Exam Vitals and nursing note reviewed.  Constitutional:      General: He is not in acute distress.    Appearance: He is ill-appearing.     Comments: Pt sitting in bedside chair; incontinent of stool; has underbite, watching TV; slurred speech and some aphasia, NAD  HENT:     Head: Normocephalic.     Comments: R severe facial droop Obvious underbite and severe dental issues -also poor mouth hygiene    Right Ear: External ear normal.     Left Ear: External ear normal.     Nose: Nose normal. No congestion.     Mouth/Throat:     Mouth: Mucous membranes are dry.  Eyes:     General:        Right eye: No discharge.        Left eye: No discharge.     Extraocular Movements: Extraocular movements intact.  Cardiovascular:     Rate and Rhythm: Normal rate and regular rhythm.     Heart sounds: Normal heart sounds. No murmur heard.    No gallop.  Pulmonary:     Effort: Pulmonary effort is normal. No respiratory distress.     Breath sounds: Normal breath sounds. No wheezing, rhonchi or rales.  Abdominal:     General: There is no distension.     Palpations: Abdomen is soft.     Tenderness: There is no abdominal tenderness.     Comments: hypoactive  Genitourinary:    Comments: Condom catheter in place- lighter amber urine Musculoskeletal:     Cervical back: Neck supple. No tenderness.     Comments: RUE- proximal 3/5; distal 2/5 LUE 5-/5 in same muscles RLE- HF 3/5; DF/PF 2/5- pt couldn't understand to test KE/KF LLE- 5-/5 in same muscles- KE/KF NT  Skin:    General: Skin is warm and dry.  Neurological:     Mental  Status: He is alert.     Comments: Severe dysarthria, as well as some aphasia- sentence level Hoffman's RUE 2-3 beats clonus RLE Face intact to light touch, but decreased entire R side  Psychiatric:     Comments: Flat affect, but somewhat interactive     Results for orders placed or performed during the hospital encounter of 10/20/22 (from the past 48 hour(s))  Renal function panel     Status: Abnormal   Collection Time: 10/26/22  3:48 AM  Result Value Ref Range   Sodium 140 135 - 145 mmol/L   Potassium 4.6 3.5 - 5.1 mmol/L   Chloride 108 98 - 111 mmol/L   CO2 21 (L) 22 - 32 mmol/L   Glucose, Bld 92 70 - 99 mg/dL    Comment: Glucose reference range applies only to samples taken after fasting for at least 8 hours.   BUN 31 (H) 8 - 23 mg/dL   Creatinine, Ser 4.38 (H) 0.61 - 1.24 mg/dL   Calcium 7.8 (L) 8.9 - 10.3 mg/dL   Phosphorus 3.1 2.5 - 4.6 mg/dL  Albumin 2.6 (L) 3.5 - 5.0 g/dL   GFR, Estimated 13 (L) >60 mL/min    Comment: (NOTE) Calculated using the CKD-EPI Creatinine Equation (2021)    Anion gap 11 5 - 15    Comment: Performed at [facility] [address], [place], [place] [address]  Magnesium     Status: None   Collection Time: 10/26/22  3:48 AM  Result Value Ref Range   Magnesium 2.1 1.7 - 2.4 mg/dL    Comment: Performed at [facility] [address] [place], [place] [address]  Renal function panel     Status: Abnormal   Collection Time: 10/27/22  8:44 AM  Result Value Ref Range   Sodium 139 135 - 145 mmol/L   Potassium 4.3 3.5 - 5.1 mmol/L   Chloride 108 98 - 111 mmol/L   CO2 21 (L) 22 - 32 mmol/L   Glucose, Bld 96 70 - 99 mg/dL    Comment: Glucose reference range applies only to samples taken after fasting for at least 8 hours.   BUN 27 (H) 8 - 23 mg/dL   Creatinine, Ser 4.27 (H) 0.61 - 1.24 mg/dL   Calcium 8.1 (L) 8.9 - 10.3 mg/dL   Phosphorus 3.2 2.5 - 4.6 mg/dL   Albumin 2.7 (L) 3.5 - 5.0 g/dL   GFR, Estimated 14 (L) >60 mL/min     Comment: (NOTE) Calculated using the CKD-EPI Creatinine Equation (2021)    Anion gap 10 5 - 15    Comment: Performed at [facility] [address] [place], [place] [address]   No results found.    Blood pressure 139/82, pulse 91, temperature 99 F (37.2 C), temperature source Oral, resp. rate 16, height 5\' 9"  (1.753 m), weight 66.1 kg, SpO2 100 %.  Medical Problem List and Plan: 1. Functional deficits secondary to L MCA stroke with R sided weakness in setting of prior R hemiparesis  -patient may  shower  -ELOS/Goals: 12- 14 days supervision 2.  Antithrombotics: -DVT/anticoagulation:  Pharmaceutical: Lovenox  -antiplatelet therapy: ASA 3. Pain Management: Tylenol prn.  4. Mood/Behavior/Sleep: LCSW to follow for evaluation and support.   --trazodone prn for insomnia.   -antipsychotic agents: N/A 5. Neuropsych/cognition: This patient is? capable of making decisions on his own behalf. 6. Skin/Wound Care: Routine pressure relief measures. And skin /wound care due to incontinence 7. Fluids/Electrolytes/Nutrition: Monitor I/O. Check CMET in am.  --continue IVF and bicarb for now. Check Mg level in am. 8. HTN: Monitor BP TID--continues to be labile. On hydralazine and amlodipine  --off ace due to acute on chronic renal failure 9. Acute on chronic renal failure: Continue IVF. Strict I/O.  --SCr down from 6.4-->4.27.   --recheck BUN/SCr in am.  9. E coli UTI: On Ceftriaxone D# 5/7  --monitor voiding with PVR/Bladder scan 10. Anemia fo chronic disease: Drop in Hgb due to hydration  --continue iron supplement.  11. Vitamin D deficiency: Started on ergocalciferol on 11/15 12.  Hypocalcemia: Ca 8.1 w/ alb2.7-->continue supplement as corrects to almost normal.  13. Questionable Schizophrenia/Psych Hx: CIR note from 2010 reviewed.  14. UTI/PNA- on Ancef- finishing IV ABX 15. Incontinent of bowel and bladder- has condom cath and using brief-will monitor skin closely.    I have  personally performed a face to face diagnostic evaluation of this patient and formulated the key components of the plan.  Additionally, I have personally reviewed laboratory data, imaging studies, as well as relevant notes and concur  with the physician assistant's documentation above.   The patient's status has not changed from the original H&P.  Any changes in documentation from the acute care chart have been noted above.    Bary Leriche, PA-C 10/27/2022

## 2022-10-27 NOTE — Progress Notes (Signed)
PROGRESS NOTE    Daniel [name]  VQQ:[id number] DOB: [birth year] DOA: 10/20/2022 PCP: Vivi Barrack, MD   Brief Narrative: 76 year old with past medical history significant for hypertension, hyperlipidemia, stroke with residual right hemiparesis, CKD stage IIIb-4, schizophrenia, BPH presented to ED via EMS with right-sided facial droop, slurred speech.LKW 3 days ago.  Potassium 2.9, bicarb 19, anion gap 13 creatinine 5.8 (baseline 2.0-2.3 in 2021).  MRI of brain showing large area of acute ischemia within the anterior left MCA territory.  Bilateral subdural hematoma, Right greater than left. Also found to have Acute on CKD related to hypovolemia, UTI.   Patient is awaiting insurance approval for CIR>    Assessment & Plan:   Principal Problem:   Acute CVA (cerebrovascular accident) ([facility]) Active Problems:   HTN (hypertension)   Hyperlipidemia   Schizophrenia ([facility])   Acute kidney injury superimposed on chronic kidney disease ([facility])   Subdural hematoma ([facility])   Metabolic acidosis   Hypokalemia   Hypocalcemia   BPH (benign prostatic hyperplasia)  1-Acute Ischemic Stroke:  MRI brain: Large area of acute ischemia within the anterior left MCA territory.  No hemorrhage or mass effect.  Bilateral subdural hematoma right greater than left. MR Angio: Occlusion of the left ICA proximal to the skull base.  Diminished flow related enhancement in the left MCA.  ECHO: EF 65 %.  Grade 1 diastolic dysfunction. Carotid doppler: Right Carotid: Velocities in the right ICA are consistent with a 40-59% stenosis. The ECA appears >50% stenosed. Left Carotid: Evidence consistent with a total occlusion of the left ICA.  The ECA appears >50% stenosed. Needs to follow up with vascular out patient per neurology  LDL; 65, A1c: 5.0. CT head; subdural stable. Started on aspirin 81 mg daily on 11/17. Neuro deficit stable.  Awaiting insurance approval for CIR>   Bilateral subdural hematoma:  Neurosurgery Dr.  Marcello Moores consulted.  No neurosurgical intervention or follow-up required.  Anticoagulation will be relatively contraindicated in the setting. CT head stable on 11/17.  Monitor on baby aspirin.   AKI on CKD stage IIIb-IV:  Metabolic acidosis in setting renal failure.  In setting hemodynamic, hypovolemia.  -Started oral Bicarb -Received Bicarb Gtt, now on NS>  -Renal US, negative for hydronephrosis.  -Nephrology consulted. Appreciate assistance.  -Cr trending down from 6.4--- 4.6--4.2  PNA, UTI;  Chest x ray: Atelectasis versus infiltrate LEFT base. Mild RIGHT upper lobe infiltrate. UA with more than 50 WBC Urine culture: 100,000 E coli. Pan sensitive.  Continue with IV ceftriaxone and Azithromycin. Day 5/5.   Hypokalemia: Resolved.   Hypocalcemia:  Started supplement.    Vitamin D deficiency. Started supplement.   HTN;  Permissive HTN Hold lisinopril in setting AKI.  BP goal 130-160 per neurology  Started on Norvasc, increased dose.  Started  Hydralazine.   Hyperlipidemia:  Started lipitor.   Schizophrenia Not on meds.   BPH On flomax.   Anemia:  Prior hb 11--8 Suspect hemodilution anemia of chronic diseases.  Anemia panel. Will need IV iron when infection controlled. Start oral iron   Hypomagnesemia; Replaced.    Estimated body mass index is 21.52 kg/m as calculated from the following:   Height as of this encounter: 5\' 9"  (1.753 m).   Weight as of this encounter: 66.1 kg.   DVT prophylaxis: scd Code Status: Full code Family Communication: care discussed with patient.  Disposition Plan:  Status is: Inpatient Remains inpatient appropriate because: inpatient for stroke, subdural. AKI. CIR>     Consultants:  Neurology Neurosurgery Nephrology   Procedures:  ECHO  Antimicrobials:    Subjective: He report plantar feet pain. He denies dyspnea.   Objective: Vitals:   10/26/22 2315 10/27/22 0330 10/27/22 0947 10/27/22 1139  BP: (!) 179/80 (!)  140/70 139/82 (!) 157/81  Pulse: 95 87 91 96  Resp: 16 16 16 17   Temp: 98.7 F (37.1 C) 99.2 F (37.3 C) 99 F (37.2 C) 98.9 F (37.2 C)  TempSrc: Oral Oral Oral Oral  SpO2: 94% 97% 100% 100%  Weight:      Height:        Intake/Output Summary (Last 24 hours) at 10/27/2022 1220 Last data filed at 10/27/2022 0947 Gross per 24 hour  Intake 6384.67 ml  Output 1950 ml  Net 4434.67 ml    Filed Weights   10/20/22 1347 10/21/22 2055  Weight: 86.2 kg 66.1 kg    Examination:  General exam: NAD Respiratory system: CTA Cardiovascular system: S 1, S  2 RRR Gastrointestinal system: BS present, soft,nt Central nervous system: Alert, neuro deficit stable. . Right side weakness, right arm 1/5  dysarthric speech. LE 4/5 Extremities: Right side weakness, right arm 1/5  dysarthric speech. LE 4/5    Data Reviewed: I have personally reviewed following labs and imaging studies  CBC: Recent Labs  Lab 10/20/22 1453 10/20/22 1501 10/21/22 0435 10/22/22 0427 10/23/22 0318 10/25/22 0415  WBC 6.1  --  7.0 7.0 5.6 6.3  NEUTROABS 4.8  --   --   --   --   --   HGB 11.8* 12.6* 10.0* 8.6* 8.5* 8.5*  HCT 36.8* 37.0* 29.4* 25.0* 24.3* 25.3*  MCV 90.0  --  89.6 86.8 85.3 89.4  PLT 249  --  212 210 230 470    Basic Metabolic Panel: Recent Labs  Lab 10/21/22 1456 10/22/22 0427 10/23/22 0324 10/24/22 0405 10/25/22 0415 10/26/22 0348 10/27/22 0844  NA  --    < > 141 141 141 140 139  K  --    < > 4.4 4.7 4.5 4.6 4.3  CL  --    < > 112* 107 107 108 108  CO2  --    < > 22 22 22  21* 21*  GLUCOSE  --    < > 93 97 99 92 96  BUN  --    < > 38* 36* 33* 31* 27*  CREATININE  --    < > 4.72* 4.65* 4.38* 4.38* 4.27*  CALCIUM  --    < > 6.6* 7.1* 7.4* 7.8* 8.1*  MG 1.5*  --  1.6*  --  1.6* 2.1  --   PHOS  --   --  1.3* 2.3* 2.9 3.1 3.2   < > = values in this interval not displayed.    GFR: Estimated Creatinine Clearance: 13.8 mL/min (A) (by C-G formula based on SCr of 4.27 mg/dL  (H)). Liver Function Tests: Recent Labs  Lab 10/20/22 1453 10/22/22 0427 10/23/22 0324 10/24/22 0405 10/25/22 0415 10/26/22 0348 10/27/22 0844  AST 26  --   --   --   --   --   --   ALT 19  --   --   --   --   --   --   ALKPHOS 81  --   --   --   --   --   --   BILITOT 0.5  --   --   --   --   --   --  PROT 7.2  --   --   --   --   --   --   ALBUMIN 3.7   < > 2.6* 2.9* 2.6* 2.6* 2.7*   < > = values in this interval not displayed.    No results for input(s): "LIPASE", "AMYLASE" in the last 168 hours. No results for input(s): "AMMONIA" in the last 168 hours. Coagulation Profile: Recent Labs  Lab 10/20/22 1453  INR 1.1    Cardiac Enzymes: No results for input(s): "CKTOTAL", "CKMB", "CKMBINDEX", "TROPONINI" in the last 168 hours. BNP (last 3 results) No results for input(s): "PROBNP" in the last 8760 hours. HbA1C: No results for input(s): "HGBA1C" in the last 72 hours.  CBG: No results for input(s): "GLUCAP" in the last 168 hours. Lipid Profile: No results for input(s): "CHOL", "HDL", "LDLCALC", "TRIG", "CHOLHDL", "LDLDIRECT" in the last 72 hours.  Thyroid Function Tests: No results for input(s): "TSH", "T4TOTAL", "FREET4", "T3FREE", "THYROIDAB" in the last 72 hours. Anemia Panel: No results for input(s): "VITAMINB12", "FOLATE", "FERRITIN", "TIBC", "IRON", "RETICCTPCT" in the last 72 hours.  Sepsis Labs: No results for input(s): "PROCALCITON", "LATICACIDVEN" in the last 168 hours.  Recent Results (from the past 240 hour(s))  Urine Culture     Status: Abnormal   Collection Time: 10/23/22  7:46 AM   Specimen: Urine, Clean Catch  Result Value Ref Range Status   Specimen Description URINE, CLEAN CATCH  Final   Special Requests   Final    NONE Performed at [facility], [address], [place], [place] [address]    Culture >=100,000 COLONIES/mL ESCHERICHIA COLI (A)  Final   Report Status 10/25/2022 FINAL  Final   Organism ID, Bacteria ESCHERICHIA COLI (A)   Final      Susceptibility   Escherichia coli - MIC*    AMPICILLIN <=2 SENSITIVE Sensitive     CEFAZOLIN <=4 SENSITIVE Sensitive     CEFEPIME <=0.12 SENSITIVE Sensitive     CEFTRIAXONE <=0.25 SENSITIVE Sensitive     CIPROFLOXACIN <=0.25 SENSITIVE Sensitive     GENTAMICIN <=1 SENSITIVE Sensitive     IMIPENEM <=0.25 SENSITIVE Sensitive     NITROFURANTOIN <=16 SENSITIVE Sensitive     TRIMETH/SULFA <=20 SENSITIVE Sensitive     AMPICILLIN/SULBACTAM <=2 SENSITIVE Sensitive     PIP/TAZO <=4 SENSITIVE Sensitive     * >=100,000 COLONIES/mL ESCHERICHIA COLI         Radiology Studies: No results found.      Scheduled Meds:   stroke: early stages of recovery book   Does not apply Once   amLODipine  10 mg Oral Daily   aspirin EC  81 mg Oral Daily   atorvastatin  40 mg Oral Daily   calcium carbonate  1 tablet Oral BID WC   ferrous sulfate  325 mg Oral Q breakfast   folic acid  1 mg Oral Daily   heparin injection (subcutaneous)  5,000 Units Subcutaneous Q8H   hydrALAZINE  25 mg Oral BID   magnesium oxide  200 mg Oral BID   nystatin  5 mL Oral QID   senna-docusate  1 tablet Oral BID   sodium bicarbonate  650 mg Oral TID   tamsulosin  0.4 mg Oral Daily   Vitamin D (Ergocalciferol)  50,000 Units Oral Q7 days   Continuous Infusions:  sodium chloride 75 mL/hr at 10/26/22 2202   azithromycin 500 mg (10/27/22 1153)   cefTRIAXone (ROCEPHIN)  IV 2 g (10/27/22 1106)     LOS:  7 days    Time spent: 35 minutes    Stacy Deshler A Tyrell Antonio, MD [facility]   If 7PM-7AM, please contact night-coverage [email or web address]  10/27/2022, 12:20 PM

## 2022-10-27 NOTE — Progress Notes (Addendum)
Inpatient Rehab Admissions Coordinator:   I opened a case with pt.'s insurance yesterday and await insurance auth for potential CIR admit.   Clemens Catholic, [place], [place] Admissions Coordinator  [phone] ([place]) [phone] (office)

## 2022-10-27 NOTE — Progress Notes (Signed)
Physical Therapy Treatment Patient Details Name: Daniel [name] MRN: [id number] DOB: [birth year] Today's Date: 10/27/2022   History of Present Illness 76 y.o. male presents to [facility] hospital on 10/20/2022 with R facial droop and slurred speech. MRI demonstrates large L MCA CVA and bilateral SDH, R>L. PMH includes HTN, HLD, CVA w/ R hemiparesis, CKD IV, schizophrenia, BPH.    PT Comments    Pt received supine and agreeable to session with continued progress towards acute goals. PT able to come to sitting EOB with min assist to elevate trunk and maintain static sitting without physical assist. Pt demonstrating squat pivot transfer bed>chair with min assist to pts L. Pt with repetitive transfers to sit<>stand/squat for peri-care with max assist to maintain for ~30 seconds each time. Pt continues to benefit from skilled PT services to progress toward functional mobility goals.    Recommendations for follow up therapy are one component of a multi-disciplinary discharge planning process, led by the attending physician.  Recommendations may be updated based on patient status, additional functional criteria and insurance authorization.  Follow Up Recommendations  Acute inpatient rehab (3hours/day)     Assistance Recommended at Discharge Intermittent Supervision/Assistance  Patient can return home with the following Two people to help with walking and/or transfers;Two people to help with bathing/dressing/bathroom;Assistance with cooking/housework;Direct supervision/assist for medications management;Direct supervision/assist for financial management;Assist for transportation;Help with stairs or ramp for entrance   Equipment Recommendations  BSC/3in1    Recommendations for Other Services       Precautions / Restrictions Precautions Precautions: Fall Restrictions Weight Bearing Restrictions: No     Mobility  Bed Mobility Overal bed mobility: Needs Assistance Bed Mobility: Supine to Sit      Supine to sit: Min assist     General bed mobility comments: min a to bring BLEs to and off EOB and to elevate trunk    Transfers Overall transfer level: Needs assistance Equipment used: 1 person hand held assist Transfers: Sit to/from Stand, Bed to chair/wheelchair/BSC Sit to Stand: Max assist     Squat pivot transfers: Min assist     General transfer comment: min a to squat pivot to chair on pt L side, max a to stand from recliner x3 with face-to-face assist for peri-care    Ambulation/Gait                   Stairs             Wheelchair Mobility    Modified Rankin (Stroke Patients Only)       Balance Overall balance assessment: Needs assistance Sitting-balance support: No upper extremity supported, Feet supported Sitting balance-Leahy Scale: Fair     Standing balance support: Single extremity supported, Reliant on assistive device for balance Standing balance-Leahy Scale: Poor Standing balance comment: unable to come to fully upright position.                            Cognition Arousal/Alertness: Awake/alert Behavior During Therapy: Flat affect Overall Cognitive Status: Impaired/Different from baseline Area of Impairment: Safety/judgement, Awareness, Problem solving, Attention, Orientation                 Orientation Level: Disoriented to, Situation Current Attention Level: Focused     Safety/Judgement: Decreased awareness of deficits, Decreased awareness of safety Awareness: Emergent Problem Solving: Slow processing, Difficulty sequencing General Comments: Pt with slow processing throughout. Pt following all one step commands with increased time. Easily distractable  Exercises      General Comments General comments (skin integrity, edema, etc.): VSS on RA      Pertinent Vitals/Pain Pain Assessment Pain Assessment: Faces Faces Pain Scale: Hurts little more Pain Location: bil feet Pain Descriptors /  Indicators: Discomfort, Burning Pain Intervention(s): Monitored during session, Limited activity within patient's tolerance, Repositioned    Home Living                          Prior Function            PT Goals (current goals can now be found in the care plan section) Acute Rehab PT Goals PT Goal Formulation: With patient Time For Goal Achievement: 11/04/22    Frequency    Min 4X/week      PT Plan      Co-evaluation              AM-PAC PT "6 Clicks" Mobility   Outcome Measure  Help needed turning from your back to your side while in a flat bed without using bedrails?: A Lot Help needed moving from lying on your back to sitting on the side of a flat bed without using bedrails?: A Lot Help needed moving to and from a bed to a chair (including a wheelchair)?: A Lot Help needed standing up from a chair using your arms (e.g., wheelchair or bedside chair)?: A Lot Help needed to walk in hospital room?: Total Help needed climbing 3-5 steps with a railing? : Total 6 Click Score: 10    End of Session Equipment Utilized During Treatment: Gait belt Activity Tolerance: Patient tolerated treatment well Patient left: in chair;with call bell/phone within reach;with chair alarm set Nurse Communication: Mobility status PT Visit Diagnosis: Other abnormalities of gait and mobility (R26.89);Other symptoms and signs involving the nervous system (R29.898);Hemiplegia and hemiparesis Hemiplegia - Right/Left: Right Hemiplegia - dominant/non-dominant: Non-dominant Hemiplegia - caused by: Cerebral infarction     Time: 9480-1655 PT Time Calculation (min) (ACUTE ONLY): 23 min  Charges:  $Therapeutic Activity: 23-37 mins           Erby Sanderson R. PTA Acute Rehabilitation Services Office: [place] 10/27/2022, 12:26 PM

## 2022-10-27 NOTE — H&P (Signed)
Physical Medicine and Rehabilitation Admission H&P        Chief Complaint  Patient presents with   Functional deficits due to stroke.       HPI: Daniel [name]. Court is a 76 year old  L handed male with history of HTN, hyperlipidemia, stroke w/residual R-HP, CKD-baseline SCr-2.0-2.3 range, question hx of schizophrenia, BPH who was admitted on 10/20/22 with right facial droop and slurred speech. He was found to have acute on chronic renal failure with BUN 41, SCr 5.8, bicarb 19 with mild metabolic acidosis as well as hypokalemia with K-2.9. patient reported multiple falls and worsening of weakness 2 days PTA. MRI brain done revealing acute ischemia in L-MCA territory and R>L SDH, generalized volume loss and encephalomalacia at prior L-ACA infarct. MRA showed occlusion of left ICA proximal to skull base with diminished flow enhancement in L-MCA. Carotid dopplers showed total occlusion of L-ICA, and bilateral ECA with > 50% stenosis.   Dr. Curly Shores felt that stroke was due to carotid stenosis which had progressed to total occlusion. Dr. Marcello Moores consulted and felt that no surgical intervention or follow up needed and recommended avoiding  DAPT and ASA alone likely safe. Dr. Erlinda Hong recommended repeat CT head which was stable and ASA added on 11/17.     2 D echo showed EF 60-65% with mild LVH. Renal ultrasound showed medical renal disease with minimal to mild interval volume loss of both kidneys. Patient treated with bicarb, potasium supplemented and foley placed with 500 cc urine return.  Dr. Carolin Sicks consulted and recommended fluid bolus as patient looked dry followed by IV bicarb and monitoring magnesium level.  He was treated with Zithromax for RML infiltrate concerning for PNA and as urine culture 11/17 showed >100,000 colonies of Ecoli ws started on IV ceftriaxone on 11/18. Intermittent fevers are resolving and  Hypocalcemia corrects to near normal and phosphate level normal. SCr improving and nephrology  recommends continuing IVF till intake stable and consider stopping Sodium bicarb if serum bicarb> 24,    Pt denies pain except "abd pain"- but just had LBM "just now"-  Has condom catheter-     Review of Systems  Unable to perform ROS: Language            Past Medical History:  Diagnosis Date   Hypertension     Psychosis ([place])     Schizophrenia ([place]) 1974   Stroke ([place]) 2010    Right Hemiparesis           Past Surgical History:  Procedure Laterality Date   FEMUR IM NAIL Right 12/12/2019    Procedure: INTRAMEDULLARY (IM) NAIL FEMORAL;  Surgeon: Renette Butters, MD;  Location: [facility];  Service: Orthopedics;  Laterality: Right;           Family History  Problem Relation Age of Onset   Hypertension Mother     CVA Sister     Breast cancer Sister     Prostate cancer Brother        Social History:  reports that he has quit smoking. His smoking use included cigarettes. He smoked an average of .1 packs per day. He has never used smokeless tobacco. He reports that he does not currently use alcohol. He reports that he does not currently use drugs.     Allergies: No Known Allergies           Medications Prior to Admission  Medication Sig Dispense Refill   amLODipine (NORVASC) 10 MG  tablet TAKE 1 TABLET BY MOUTH EVERY DAY (Patient taking differently: Take 10 mg by mouth daily.) 30 tablet 0   atorvastatin (LIPITOR) 40 MG tablet TAKE 1 TABLET BY MOUTH EVERY DAY (Patient taking differently: Take 40 mg by mouth daily.) 90 tablet 3   clopidogrel (PLAVIX) 75 MG tablet TAKE 1 TABLET BY MOUTH EVERY DAY (Patient taking differently: Take 75 mg by mouth daily.) 30 tablet 0   Emollient (EUCERIN) lotion Apply 1 Application topically as needed for dry skin.       folic acid (FOLVITE) 1 MG tablet TAKE 1 TABLET BY MOUTH EVERY DAY (Patient taking differently: Take 1 mg by mouth daily.) 30 tablet 0   lisinopril (ZESTRIL) 20 MG tablet TAKE 1 TABLET BY MOUTH EVERY DAY (Patient taking differently:  Take 20 mg by mouth daily.) 30 tablet 0   metoprolol tartrate (LOPRESSOR) 100 MG tablet TAKE 1 TABLET BY MOUTH TWICE A DAY (Patient taking differently: Take 100 mg by mouth 2 (two) times daily.) 60 tablet 0   tamsulosin (FLOMAX) 0.4 MG CAPS capsule TAKE 1 CAPSULE BY MOUTH EVERY DAY (Patient taking differently: Take 0.4 mg by mouth daily.) 30 capsule 0          Home: Home Living Family/patient expects to be discharged to:: Private residence Living Arrangements: Other relatives (sister) Available Help at Discharge: Family, Available 24 hours/day Type of Home: House Home Access: Ramped entrance Home Layout: One level Bathroom Shower/Tub: Chiropodist: Standard Bathroom Accessibility: Yes Home Equipment: Conservation officer, nature (2 wheels), Wheelchair - manual  Lives With: Other (Comment) (sister)   Functional History: Prior Function Prior Level of Function : Needs assist Mobility Comments: pt reports performing stand pivot transfers to wheelchair independently, mobilizing in wheelchair around the home ADLs Comments: assistance for IADLs, reports bathing, dressing, independence with ADLs. Sister's friend occasionally helps with cooking/cleaning, etc   Functional Status:  Mobility: Bed Mobility Overal bed mobility: Needs Assistance Bed Mobility: Supine to Sit Supine to sit: Mod assist Sit to supine: Mod assist General bed mobility comments: up in recliner upon OT entry Transfers Overall transfer level: Needs assistance Equipment used: 1 person hand held assist Transfers: Bed to chair/wheelchair/BSC Sit to Stand: Max assist, From elevated surface Bed to/from chair/wheelchair/BSC transfer type:: Squat pivot Squat pivot transfers: Mod assist General transfer comment: mod a to squat pivot toward R   ADL: ADL Overall ADL's : Needs assistance/impaired Eating/Feeding: Sitting, Minimal assistance Eating/Feeding Details (indicate cue type and reason): unable to cut foods or  open packages at this time. Grooming: Wash/dry hands, Wash/dry face, Oral care, Moderate assistance Grooming Details (indicate cue type and reason): Mod A, only engaging with LUE Upper Body Bathing: Minimal assistance, Sitting Lower Body Bathing: Moderate assistance, Sit to/from stand Upper Body Dressing : Moderate assistance, Sitting Lower Body Dressing: Min guard, Sitting/lateral leans, Moderate assistance Lower Body Dressing Details (indicate cue type and reason): Min guard A in recliner to don socks; would require mod A to stand and pull up pants Toilet Transfer: Minimal assistance, Squat-pivot, BSC/3in1, +2 for physical assistance Toilet Transfer Details (indicate cue type and reason): Min A for squat pivot Toileting- Clothing Manipulation and Hygiene: Min guard, Sitting/lateral lean Functional mobility during ADLs: Moderate assistance, +2 for safety/equipment, Cueing for safety, Cueing for sequencing, +2 for physical assistance General ADL Comments: Session focus on seated ADLs as patient had just transitioned to chair with PTA   Cognition: Cognition Overall Cognitive Status: Impaired/Different from baseline Arousal/Alertness: Awake/alert Orientation Level: Oriented X4 Year:  (  2040) Month: November Day of Week: Incorrect Attention: Sustained Sustained Attention: Impaired Sustained Attention Impairment: Verbal basic Memory: Impaired Memory Impairment: Storage deficit, Decreased recall of new information, Retrieval deficit Awareness: Impaired Awareness Impairment: Intellectual impairment, Emergent impairment Cognition Arousal/Alertness: Awake/alert Behavior During Therapy: Flat affect Overall Cognitive Status: Impaired/Different from baseline Area of Impairment: Safety/judgement, Awareness, Problem solving, Attention, Orientation Orientation Level: Disoriented to, Situation Current Attention Level: Focused Safety/Judgement: Decreased awareness of deficits, Decreased awareness  of safety Awareness: Emergent Problem Solving: Slow processing, Difficulty sequencing General Comments: Pt with slow processing throughout. Pt following all one step commands with increased time. Easily distractable when any provider would enter in during OT session     Blood pressure 139/82, pulse 91, temperature 99 F (37.2 C), temperature source Oral, resp. rate 16, height 5\' 9"  (1.753 m), weight 66.1 kg, SpO2 100 %. Physical Exam Vitals and nursing note reviewed.  Constitutional:      General: He is not in acute distress.    Appearance: He is ill-appearing.     Comments: Pt sitting in bedside chair; incontinent of stool; has underbite, watching TV; slurred speech and some aphasia, NAD  HENT:     Head: Normocephalic.     Comments: R severe facial droop Obvious underbite and severe dental issues -also poor mouth hygiene    Right Ear: External ear normal.     Left Ear: External ear normal.     Nose: Nose normal. No congestion.     Mouth/Throat:     Mouth: Mucous membranes are dry.  Eyes:     General:        Right eye: No discharge.        Left eye: No discharge.     Extraocular Movements: Extraocular movements intact.  Cardiovascular:     Rate and Rhythm: Normal rate and regular rhythm.     Heart sounds: Normal heart sounds. No murmur heard.    No gallop.  Pulmonary:     Effort: Pulmonary effort is normal. No respiratory distress.     Breath sounds: Normal breath sounds. No wheezing, rhonchi or rales.  Abdominal:     General: There is no distension.     Palpations: Abdomen is soft.     Tenderness: There is no abdominal tenderness.     Comments: hypoactive  Genitourinary:    Comments: Condom catheter in place- lighter amber urine Musculoskeletal:     Cervical back: Neck supple. No tenderness.     Comments: RUE- proximal 3/5; distal 2/5 LUE 5-/5 in same muscles RLE- HF 3/5; DF/PF 2/5- pt couldn't understand to test KE/KF LLE- 5-/5 in same muscles- KE/KF NT  Skin:     General: Skin is warm and dry.  Neurological:     Mental Status: He is alert.     Comments: Severe dysarthria, as well as some aphasia- sentence level Hoffman's RUE 2-3 beats clonus RLE Face intact to light touch, but decreased entire R side  Psychiatric:     Comments: Flat affect, but somewhat interactive        Lab Results Last 48 Hours        Results for orders placed or performed during the hospital encounter of 10/20/22 (from the past 48 hour(s))  Renal function panel     Status: Abnormal    Collection Time: 10/26/22  3:48 AM  Result Value Ref Range    Sodium 140 135 - 145 mmol/L    Potassium 4.6 3.5 - 5.1 mmol/L    Chloride  108 98 - 111 mmol/L    CO2 21 (L) 22 - 32 mmol/L    Glucose, Bld 92 70 - 99 mg/dL      Comment: Glucose reference range applies only to samples taken after fasting for at least 8 hours.    BUN 31 (H) 8 - 23 mg/dL    Creatinine, Ser 4.38 (H) 0.61 - 1.24 mg/dL    Calcium 7.8 (L) 8.9 - 10.3 mg/dL    Phosphorus 3.1 2.5 - 4.6 mg/dL    Albumin 2.6 (L) 3.5 - 5.0 g/dL    GFR, Estimated 13 (L) >60 mL/min      Comment: (NOTE) Calculated using the CKD-EPI Creatinine Equation (2021)      Anion gap 11 5 - 15      Comment: Performed at [facility] [address] [place], [place] [address]  Magnesium     Status: None    Collection Time: 10/26/22  3:48 AM  Result Value Ref Range    Magnesium 2.1 1.7 - 2.4 mg/dL      Comment: Performed at [facility] [address] [place], [place] [address]  Renal function panel     Status: Abnormal    Collection Time: 10/27/22  8:44 AM  Result Value Ref Range    Sodium 139 135 - 145 mmol/L    Potassium 4.3 3.5 - 5.1 mmol/L    Chloride 108 98 - 111 mmol/L    CO2 21 (L) 22 - 32 mmol/L    Glucose, Bld 96 70 - 99 mg/dL      Comment: Glucose reference range applies only to samples taken after fasting for at least 8 hours.    BUN 27 (H) 8 - 23 mg/dL    Creatinine, Ser 4.27 (H) 0.61 - 1.24 mg/dL    Calcium 8.1  (L) 8.9 - 10.3 mg/dL    Phosphorus 3.2 2.5 - 4.6 mg/dL    Albumin 2.7 (L) 3.5 - 5.0 g/dL    GFR, Estimated 14 (L) >60 mL/min      Comment: (NOTE) Calculated using the CKD-EPI Creatinine Equation (2021)      Anion gap 10 5 - 15      Comment: Performed at [facility] [address], [place], [place] [address]      Imaging Results (Last 48 hours)  No results found.         Blood pressure 139/82, pulse 91, temperature 99 F (37.2 C), temperature source Oral, resp. rate 16, height 5\' 9"  (1.753 m), weight 66.1 kg, SpO2 100 %.   Medical Problem List and Plan: 1. Functional deficits secondary to L MCA stroke with R sided weakness in setting of prior R hemiparesis             -patient may  shower             -ELOS/Goals: 12- 14 days supervision 2.  Antithrombotics: -DVT/anticoagulation:  Pharmaceutical: Lovenox             -antiplatelet therapy: ASA 3. Pain Management: Tylenol prn.  4. Mood/Behavior/Sleep: LCSW to follow for evaluation and support.              --trazodone prn for insomnia.              -antipsychotic agents: N/A 5. Neuropsych/cognition: This patient is? capable of making decisions on his own behalf. 6. Skin/Wound Care: Routine pressure relief measures. And skin /wound care due to incontinence 7.  Fluids/Electrolytes/Nutrition: Monitor I/O. Check CMET in am.             --continue IVF and bicarb for now. Check Mg level in am. 8. HTN: Monitor BP TID--continues to be labile. On hydralazine and amlodipine             --off ace due to acute on chronic renal failure 9. Acute on chronic renal failure: Continue IVF. Strict I/O.  --SCr down from 6.4-->4.27.              --recheck BUN/SCr in am.  9. E coli UTI: On Ceftriaxone D# 5/7             --monitor voiding with PVR/Bladder scan 10. Anemia fo chronic disease: Drop in Hgb due to hydration             --continue iron supplement.  11. Vitamin D deficiency: Started on ergocalciferol on 11/15 12.  Hypocalcemia: Ca  8.1 w/ alb2.7-->continue supplement as corrects to almost normal.  13. Questionable Schizophrenia/Psych Hx: CIR note from 2010 reviewed.  14. UTI/PNA- on Ancef- finishing IV ABX 15. Incontinent of bowel and bladder- has condom cath and using brief-will monitor skin closely.      I have personally performed a face to face diagnostic evaluation of this patient and formulated the key components of the plan.  Additionally, I have personally reviewed laboratory data, imaging studies, as well as relevant notes and concur with the physician assistant's documentation above.   The patient's status has not changed from the original H&P.  Any changes in documentation from the acute care chart have been noted above.     Bary Leriche, PA-C 10/27/2022

## 2022-10-27 NOTE — Progress Notes (Signed)
Inpatient Rehabilitation  Patient information reviewed and entered into eRehab system by Riona Lahti M. Dijuan Sleeth, M.A., CCC/SLP, PPS Coordinator.  Information including medical coding, functional ability and quality indicators will be reviewed and updated through discharge.    

## 2022-10-27 NOTE — TOC Transition Note (Signed)
Transition of Care [facility]) - CM/SW Discharge Note   Patient Details  Name: Daniel [name] MRN: [id number] Date of Birth: 07-Jan-1946  Transition of Care [facility]) CM/SW Contact:  Pollie Friar, RN Phone Number: 10/27/2022, 4:34 PM   Clinical Narrative:    Pt is discharging to CIR today. CM signing off.    Final next level of care: IP Rehab Facility Barriers to Discharge: No Barriers Identified   Patient Goals and CMS Choice   CMS Medicare.gov Compare Post Acute Care list provided to:: Patient Choice offered to / list presented to : Patient  Discharge Placement                       Discharge Plan and Services                                     Social Determinants of Health (SDOH) Interventions     Readmission Risk Interventions     No data to display

## 2022-10-27 NOTE — Discharge Summary (Signed)
Physician Discharge Summary   Patient: Daniel [name] MRN: [id number] DOB: 1946/10/19  Admit date:     10/20/2022  Discharge date: 10/27/22  Discharge Physician: Elmarie Shiley   PCP: Vivi Barrack, MD   Recommendations at discharge:   Needs follow up with neurology post stroke.  Follow up with nephrology  Continue with IV fluids until he is taking enough oral intake.   Discharge Diagnoses: Principal Problem:   Acute CVA (cerebrovascular accident) ([place]) Active Problems:   HTN (hypertension)   Hyperlipidemia   Schizophrenia ([place])   Acute kidney injury superimposed on chronic kidney disease (HCC)   Subdural hematoma (HCC)   Metabolic acidosis   Hypokalemia   Hypocalcemia   BPH (benign prostatic hyperplasia)  Resolved Problems:   * No resolved hospital problems. *  Hospital Course: 76 year old with past medical history significant for hypertension, hyperlipidemia, stroke with residual right hemiparesis, CKD stage IIIb-4, schizophrenia, BPH presented to ED via EMS with right-sided facial droop, slurred speech.LKW 3 days ago.  Potassium 2.9, bicarb 19, anion gap 13 creatinine 5.8 (baseline 2.0-2.3 in 2021).  MRI of brain showing large area of acute ischemia within the anterior left MCA territory.  Bilateral subdural hematoma, Right greater than left. Also found to have Acute on CKD related to hypovolemia, UTI.    Transfer to  CIR>   Assessment and Plan: 1-Acute Ischemic Stroke:  MRI brain: Large area of acute ischemia within the anterior left MCA territory.  No hemorrhage or mass effect.  Bilateral subdural hematoma right greater than left. MR Angio: Occlusion of the left ICA proximal to the skull base.  Diminished flow related enhancement in the left MCA.  ECHO: EF 65 %.  Grade 1 diastolic dysfunction. Carotid doppler: Right Carotid: Velocities in the right ICA are consistent with a 40-59% stenosis. The ECA appears >50% stenosed. Left Carotid: Evidence consistent with a  total occlusion of the left ICA.  The ECA appears >50% stenosed. Needs to follow up with vascular out patient per neurology  LDL; 65, A1c: 5.0. CT head; subdural stable. Started on aspirin 81 mg daily on 11/17. Neuro deficit stable.  Awaiting insurance approval for CIR>    Bilateral subdural hematoma:  Neurosurgery Dr. Marcello Moores consulted.  No neurosurgical intervention or follow-up required.  Anticoagulation will be relatively contraindicated in the setting. CT head stable on 11/17.  Monitor on baby aspirin.    AKI on CKD stage IIIb-IV:  Metabolic acidosis in setting renal failure.  In setting hemodynamic, hypovolemia.  -Started oral Bicarb -Received Bicarb Gtt, now on NS>  -Renal US, negative for hydronephrosis.  -Nephrology consulted. Appreciate assistance.  -Cr trending down from 6.4--- 4.6--4.2 Continue with IV fluids until he is consistently taking enough oral intake.   PNA, UTI;  Chest x ray: Atelectasis versus infiltrate LEFT base. Mild RIGHT upper lobe infiltrate. UA with more than 50 WBC Urine culture: 100,000 E coli. Pan sensitive.  Continue with IV ceftriaxone and Azithromycin. Day 5/5.    Hypokalemia: Resolved.    Hypocalcemia:  Started supplement.      Vitamin D deficiency. Started supplement.    HTN;  Permissive HTN Hold lisinopril in setting AKI.  BP goal 130-160 per neurology  Started on Norvasc, increased dose.  Started  Hydralazine.    Hyperlipidemia:  Started lipitor.    Schizophrenia Not on meds.    BPH On flomax.    Anemia:  Prior hb 11--8 Suspect hemodilution anemia of chronic diseases.  Anemia panel. Will need IV iron  when infection controlled. Start oral iron    Hypomagnesemia; Replaced.      Estimated body mass index is 21.52 kg/m as calculated from the following:   Height as of this encounter: 5\' 9"  (1.753 m).   Weight as of this encounter: 66.1 kg.          Consultants: Neurology, Nephrology  Procedures performed:  ECHO Disposition: Rehabilitation facility Diet recommendation:  Discharge Diet Orders (From admission, onward)     Start     Ordered   10/27/22 0000  Diet - low sodium heart healthy        10/27/22 1523           Renal diet DISCHARGE MEDICATION: Allergies as of 10/27/2022   No Known Allergies      Medication List     STOP taking these medications    atorvastatin 40 MG tablet Commonly known as: LIPITOR   clopidogrel 75 MG tablet Commonly known as: PLAVIX   lisinopril 20 MG tablet Commonly known as: ZESTRIL   metoprolol tartrate 100 MG tablet Commonly known as: LOPRESSOR       TAKE these medications    amLODipine 10 MG tablet Commonly known as: NORVASC TAKE 1 TABLET BY MOUTH EVERY DAY   aspirin EC 81 MG tablet Take 1 tablet (81 mg total) by mouth daily. Swallow whole. Start taking on: October 28, 2022   calcium carbonate 1250 (500 Ca) MG tablet Commonly known as: OS-CAL - dosed in mg of elemental calcium Take 1 tablet (1,250 mg total) by mouth 2 (two) times daily with a meal.   eucerin lotion Apply 1 Application topically as needed for dry skin.   ferrous sulfate 325 (65 FE) MG tablet Take 1 tablet (325 mg total) by mouth daily with breakfast. Start taking on: October 28, 8526   folic acid 1 MG tablet Commonly known as: FOLVITE TAKE 1 TABLET BY MOUTH EVERY DAY   hydrALAZINE 25 MG tablet Commonly known as: APRESOLINE Take 1 tablet (25 mg total) by mouth 2 (two) times daily.   magnesium oxide 400 (240 Mg) MG tablet Commonly known as: MAG-OX Take 0.5 tablets (200 mg total) by mouth 2 (two) times daily.   senna-docusate 8.6-50 MG tablet Commonly known as: Senokot-S Take 1 tablet by mouth 2 (two) times daily.   sodium bicarbonate 650 MG tablet Take 1 tablet (650 mg total) by mouth 3 (three) times daily.   tamsulosin 0.4 MG Caps capsule Commonly known as: FLOMAX TAKE 1 CAPSULE BY MOUTH EVERY DAY   Vitamin D (Ergocalciferol) 1.25 MG (50000  UNIT) Caps capsule Commonly known as: DRISDOL Take 1 capsule (50,000 Units total) by mouth every 7 (seven) days. Start taking on: October 28, 2022        Follow-up Information     [facility]. Schedule an appointment as soon as possible for a visit in 1 month(s).   Specialty: Neurology Why: stroke clinic Contact information: [address] [place] [phone]               Discharge Exam: Danley Danker Weights   10/20/22 1347 10/21/22 2055  Weight: 86.2 kg 66.1 kg   General NAD  Condition at discharge: stable  The results of significant diagnostics from this hospitalization (including imaging, microbiology, ancillary and laboratory) are listed below for reference.   Imaging Studies: DG CHEST PORT 1 VIEW  Result Date: 10/23/2022 CLINICAL DATA:  Fever, recovering from stroke EXAM: PORTABLE CHEST 1 VIEW COMPARISON:  Portable exam 0939 hours compared to 12/11/2019 FINDINGS: Enlargement of cardiac silhouette. Mediastinal contours and pulmonary vascularity normal. Hazy RIGHT upper lobe infiltrate near minor fissure. Mild atelectasis versus infiltrate LEFT lung base with central peribronchial thickening again seen. Remaining lungs clear without pleural effusion or pneumothorax. Bones demineralized. IMPRESSION: Atelectasis versus infiltrate LEFT base. Mild RIGHT upper lobe infiltrate. Electronically Signed   By: Lavonia Dana M.D.   On: 10/23/2022 11:16   CT HEAD WO CONTRAST (5MM)  Result Date: 10/23/2022 CLINICAL DATA:  Stroke follow-up.  Weakness EXAM: CT HEAD WITHOUT CONTRAST TECHNIQUE: Contiguous axial images were obtained from the base of the skull through the vertex without intravenous contrast. RADIATION DOSE REDUCTION: This exam was performed according to the departmental dose-optimization program which includes automated exposure control, adjustment of the mA and/or kV according to patient size and/or use of iterative  reconstruction technique. COMPARISON:  Brain MRI from 3 days ago FINDINGS: Brain: No noted progression of the left MCA branch infarct centered at the frontal lobe. Pre-existing left cerebral cortex infarcts. Thin subdural hematoma along the posterior falx and right tentorium. Lateral to the right cerebellar hemisphere subdural hemorrhage measures up to 2 mm in thickness, non progressed. The extent of subdural hemorrhage is underestimated compared to prior MRI. Stable low-density in the right cerebral white matter attributed to chronic small vessel ischemia. Vascular: No hyperdense vessel or unexpected calcification. Skull: Normal. Negative for fracture or focal lesion. Sinuses/Orbits: No acute finding IMPRESSION: 1. No progression or hemorrhagic conversion at the left MCA branch infarct. Remote left ACA/MCA infarction. 2. Unchanged thin subdural hematoma without mass effect. Electronically Signed   By: Jorje Guild M.D.   On: 10/23/2022 07:47   VAS US CAROTID (at [facility] and [facility] only)  Result Date: 10/22/2022 Carotid Arterial Duplex Study Patient Name:  BRODIN GELPI  Date of Exam:   10/21/2022 Medical Rec #: [id number]       Accession #:    [id number] Date of Birth: 08-29-1946      Patient Gender: M Patient Age:   69 years Exam Location:  [facility] Procedure:      VAS US CAROTID Referring Phys: Wandra Feinstein RATHORE --------------------------------------------------------------------------------  Indications:       CVA. Risk Factors:      Hypertension, hyperlipidemia, past history of smoking, prior                    CVA, PAD. Comparison Study:  Left ICA occlusion noted on MRA head 10-20-2022 Performing Technologist: Darlin Coco RDMS, RVT  Examination Guidelines: A complete evaluation includes B-mode imaging, spectral Doppler, color Doppler, and power Doppler as needed of all accessible portions of each vessel. Bilateral testing is considered an integral part of a complete examination. Limited examinations  for reoccurring indications may be performed as noted.  Right Carotid Findings: +----------+--------+--------+--------+-------------------------+--------+           PSV cm/sEDV cm/sStenosisPlaque Description       Comments +----------+--------+--------+--------+-------------------------+--------+ CCA Prox  100     15                                                +----------+--------+--------+--------+-------------------------+--------+ CCA Distal81      23                                                +----------+--------+--------+--------+-------------------------+--------+  ICA Prox  193     40      40-59%  calcific and heterogenous         +----------+--------+--------+--------+-------------------------+--------+ ICA Mid   86      26                                                +----------+--------+--------+--------+-------------------------+--------+ ICA Distal51      22                                                +----------+--------+--------+--------+-------------------------+--------+ ECA       272     28      >50%                                      +----------+--------+--------+--------+-------------------------+--------+ +----------+--------+-------+---------+-------------------+           PSV cm/sEDV cmsDescribe Arm Pressure (mmHG) +----------+--------+-------+---------+-------------------+ [id number]            Turbulent                    +----------+--------+-------+---------+-------------------+ +---------+--------+---+--------+--+---------+ VertebralPSV cm/s100EDV cm/s16Antegrade +---------+--------+---+--------+--+---------+  Left Carotid Findings: +----------+--------+--------+--------+---------------------------+--------+           PSV cm/sEDV cm/sStenosisPlaque Description         Comments +----------+--------+--------+--------+---------------------------+--------+ CCA Prox  125     18                                                   +----------+--------+--------+--------+---------------------------+--------+ CCA Distal75      15                                                  +----------+--------+--------+--------+---------------------------+--------+ ICA Prox                  Occludedhypoechoic and heterogenous         +----------+--------+--------+--------+---------------------------+--------+ ICA Mid                   Occludedhypoechoic and heterogenous         +----------+--------+--------+--------+---------------------------+--------+ ICA Distal                Occludedhypoechoic and heterogenous         +----------+--------+--------+--------+---------------------------+--------+ ECA       318     47      >50%                                        +----------+--------+--------+--------+---------------------------+--------+ +----------+--------+--------+----------------+-------------------+           PSV cm/sEDV cm/sDescribe        Arm Pressure (mmHG) +----------+--------+--------+----------------+-------------------+ [id number]             Multiphasic, WNL                    +----------+--------+--------+----------------+-------------------+ +---------+--------+---+--------+--+---------+  [place] cm/s144EDV cm/s36Antegrade +---------+--------+---+--------+--+---------+   Summary: Right Carotid: Velocities in the right ICA are consistent with a 40-59%                stenosis. The ECA appears >50% stenosed. Left Carotid: Evidence consistent with a total occlusion of the left ICA. The               ECA appears >50% stenosed. Vertebrals:  Bilateral vertebral arteries demonstrate antegrade flow. Subclavians: Right subclavian artery flow was disturbed. Normal flow              hemodynamics were seen in the left subclavian artery. *See table(s) above for measurements and observations.  Electronically signed by Antony Contras MD on 10/22/2022 at 8:25:16 AM.     Final    ECHOCARDIOGRAM COMPLETE  Result Date: 10/21/2022    ECHOCARDIOGRAM REPORT   Patient Name:   SAHAJ BONA Date of Exam: 10/21/2022 Medical Rec #:  [id number]      Height:       69.0 in Accession #:    [id number]     Weight:       190.0 lb Date of Birth:  1946-09-13     BSA:          2.021 m Patient Age:    86 years       BP:           126/71 mmHg Patient Gender: M              HR:           63 bpm. Exam Location:  Inpatient Procedure: Color Doppler and Cardiac Doppler Indications:    stroke  History:        Patient has no prior history of Echocardiogram examinations.                 Risk Factors:Hypertension and Dyslipidemia.  Sonographer:    Melissa Morford RDCS (AE, PE) Referring Phys: [id number] VASUNDHRA [place]  1. Left ventricular ejection fraction, by estimation, is 60 to 65%. The left ventricle has normal function. The left ventricle has no regional wall motion abnormalities. There is mild left ventricular hypertrophy. Left ventricular diastolic parameters are consistent with Grade I diastolic dysfunction (impaired relaxation).  2. Right ventricular systolic function is normal. The right ventricular size is normal.  3. Left atrial size was mild to moderately dilated.  4. Right atrial size was mildly dilated.  5. The mitral valve is normal in structure. No evidence of mitral valve regurgitation. No evidence of mitral stenosis.  6. The aortic valve is tricuspid. Aortic valve regurgitation is not visualized. No aortic stenosis is present.  7. The inferior vena cava is normal in size with greater than 50% respiratory variability, suggesting right atrial pressure of 3 mmHg. Conclusion(s)/Recommendation(s): No intracardiac source of embolism detected on this transthoracic study. Consider a transesophageal echocardiogram to exclude cardiac source of embolism if clinically indicated. FINDINGS  Left Ventricle: Left ventricular ejection fraction, by estimation, is 60 to 65%. The left ventricle  has normal function. The left ventricle has no regional wall motion abnormalities. The left ventricular internal cavity size was normal in size. There is  mild left ventricular hypertrophy. Left ventricular diastolic parameters are consistent with Grade I diastolic dysfunction (impaired relaxation). Right Ventricle: The right ventricular size is normal. No increase in right ventricular wall thickness. Right ventricular systolic function is normal. Left Atrium: Left atrial size was mild to moderately dilated. Right Atrium: Right atrial  size was mildly dilated. Pericardium: There is no evidence of pericardial effusion. Mitral Valve: The mitral valve is normal in structure. No evidence of mitral valve regurgitation. No evidence of mitral valve stenosis. Tricuspid Valve: The tricuspid valve is normal in structure. Tricuspid valve regurgitation is trivial. No evidence of tricuspid stenosis. Aortic Valve: The aortic valve is tricuspid. Aortic valve regurgitation is not visualized. No aortic stenosis is present. Pulmonic Valve: The pulmonic valve was normal in structure. Pulmonic valve regurgitation is not visualized. No evidence of pulmonic stenosis. Aorta: The aortic root is normal in size and structure. Venous: The inferior vena cava is normal in size with greater than 50% respiratory variability, suggesting right atrial pressure of 3 mmHg. IAS/Shunts: No atrial level shunt detected by color flow Doppler.  LEFT VENTRICLE PLAX 2D LVIDd:         4.90 cm      Diastology LVIDs:         3.50 cm      LV e' medial:    5.00 cm/s LV PW:         1.20 cm      LV E/e' medial:  16.0 LV IVS:        1.20 cm      LV e' lateral:   5.00 cm/s LVOT diam:     2.10 cm      LV E/e' lateral: 16.0 LV SV:         70 LV SV Index:   35 LVOT Area:     3.46 cm  LV Volumes (MOD) LV vol d, MOD A2C: 64.2 ml LV vol d, MOD A4C: 141.0 ml LV vol s, MOD A2C: 42.9 ml LV vol s, MOD A4C: 70.4 ml LV SV MOD A2C:     21.3 ml LV SV MOD A4C:     141.0 ml LV SV MOD  BP:      45.5 ml RIGHT VENTRICLE RV S prime:     15.30 cm/s TAPSE (M-mode): 2.5 cm LEFT ATRIUM              Index        RIGHT ATRIUM           Index LA diam:        3.10 cm  1.53 cm/m   RA Area:     21.60 cm LA Vol (A2C):   52.3 ml  25.87 ml/m  RA Volume:   67.60 ml  33.44 ml/m LA Vol (A4C):   106.0 ml 52.44 ml/m LA Biplane Vol: 76.1 ml  37.65 ml/m  AORTIC VALVE LVOT Vmax:   95.10 cm/s LVOT Vmean:  70.500 cm/s LVOT VTI:    0.202 m  AORTA Ao Root diam: 3.40 cm MITRAL VALVE                TRICUSPID VALVE MV Area (PHT): 4.12 cm     TR Peak grad:   10.9 mmHg MV Decel Time: 184 msec     TR Vmax:        165.00 cm/s MV E velocity: 80.20 cm/s MV A velocity: 108.00 cm/s  SHUNTS MV E/A ratio:  0.74         Systemic VTI:  0.20 m                             Systemic Diam: 2.10 cm Candee Furbish MD Electronically signed by Candee Furbish MD Signature Date/Time: 10/21/2022/11:12:02 AM  Final    US RENAL  Result Date: 10/21/2022 CLINICAL DATA:  510258 with acute kidney injury. EXAM: RENAL / URINARY TRACT ULTRASOUND COMPLETE COMPARISON:  Renal ultrasound 12/11/2019 FINDINGS: Right Kidney: Renal measurements: 9.1 x 4.3 x 3.7 cm = volume: 74.5 mL, previously 89 mL. Echogenicity in general mildly increased as before. No mass, stones or hydronephrosis visualized. Left Kidney: Renal measurements: 10.2 x 4.4 x 4.1 = volume: 98.3 mL, previously 125 mL. Echogenicity in general mildly increased as before. There is a 3 cm simple cyst in the inferior pole, stable. No mass or hydronephrosis visualized. Bladder: Appears normal for degree of bladder distention. Other: None. IMPRESSION: 1. No acute findings. No hydronephrosis. 2. Mildly increased echogenicity of both kidneys as before, compatible with medical renal disease. 3. Stable 3 cm simple cyst in the inferior pole of the left kidney. 4. By measurements there appears to be a minimal to mild interval volume loss in both kidneys Electronically Signed   By: Telford Nab M.D.   On:  10/21/2022 00:51   MR ANGIO HEAD WO CONTRAST  Result Date: 10/20/2022 CLINICAL DATA:  Stroke or TIA EXAM: MRA HEAD WITHOUT CONTRAST TECHNIQUE: Angiographic images of the Circle of Willis were acquired using MRA technique without intravenous contrast. COMPARISON:  None Available. FINDINGS: POSTERIOR CIRCULATION: --Vertebral arteries: Normal --Inferior cerebellar arteries: Normal. --Basilar artery: Normal. --Superior cerebellar arteries: Normal. --Posterior cerebral arteries: Normal. ANTERIOR CIRCULATION: --Intracranial internal carotid arteries: Loss of normal flow related enhancement within the left internal carotid artery. Diminished flow related enhancement of the distal intracranial ICA. --Anterior cerebral arteries (ACA): Normal. --Middle cerebral arteries (MCA): Poor flow related enhancement at the left MCA. Right MCA is normal. ANATOMIC VARIANTS: None IMPRESSION: 1. Occlusion of the left ICA proximal to the skull base. 2. Diminished flow related enhancement in the left MCA. CTA of the head and neck may provide a more complete assessment. Electronically Signed   By: Ulyses Jarred M.D.   On: 10/20/2022 23:32   MR BRAIN WO CONTRAST  Result Date: 10/20/2022 CLINICAL DATA:  Acute neurologic deficit EXAM: MRI HEAD WITHOUT CONTRAST TECHNIQUE: Multiplanar, multiecho pulse sequences of the brain and surrounding structures were obtained without intravenous contrast. COMPARISON:  07/31/2009 FINDINGS: Brain: Large area of acute ischemia within the anterior left MCA territory. There are bilateral subdural hematomas right greater than left. There is confluent hyperintense T2-weighted signal within the white matter. Generalized volume loss. Encephalomalacia at the site of old left anterior cerebral artery infarct. The midline structures are normal. Vascular: Major flow voids are preserved. Skull and upper cervical spine: Normal calvarium and skull base. Visualized upper cervical spine and soft tissues are normal.  Sinuses/Orbits:No paranasal sinus fluid levels or advanced mucosal thickening. No mastoid or middle ear effusion. Normal orbits. IMPRESSION: 1. Large area of acute ischemia within the anterior left MCA territory. No hemorrhage or mass effect. 2. Bilateral subdural hematomas right greater than left. Electronically Signed   By: Ulyses Jarred M.D.   On: 10/20/2022 20:18   CT Head Wo Contrast  Result Date: 10/20/2022 CLINICAL DATA:  Neuro deficit, acute, stroke suspected EXAM: CT HEAD WITHOUT CONTRAST TECHNIQUE: Contiguous axial images were obtained from the base of the skull through the vertex without intravenous contrast. RADIATION DOSE REDUCTION: This exam was performed according to the departmental dose-optimization program which includes automated exposure control, adjustment of the mA and/or kV according to patient size and/or use of iterative reconstruction technique. COMPARISON:  None Available. BRAIN: BRAIN Cerebral ventricle sizes are concordant with  the degree of cerebral volume loss. Patchy and confluent areas of decreased attenuation are noted throughout the deep and periventricular white matter of the cerebral hemispheres bilaterally, compatible with chronic microvascular ischemic disease. Chronic left frontoparietal and temporal encephalomalacia. No evidence of large-territorial acute infarction. No parenchymal hemorrhage. No mass lesion. Acute density 14mm right subdural hematoma extending along the falx cerebral and tentorium. Query separate 85mm left parafalcine hematoma. No mass effect or midline shift. No hydrocephalus. Basilar cisterns are patent. Vascular: No hyperdense vessel. Atherosclerotic calcifications are present within the cavernous internal carotid and vertebral arteries. Skull: No acute fracture or focal lesion. Sinuses/Orbits: Paranasal sinuses and mastoid air cells are clear. The orbits are unremarkable. Other: None. IMPRESSION: 1. Acute 6 mm right subdural hematoma extending along  the falx cerebri and tentorium. No definite associated mass effect. 2. Query separate 68mm left parafalcine hematoma. These results were called by telephone at the time of interpretation on 10/20/2022 at 5:04 pm to provider [facility] , who verbally acknowledged these results. Electronically Signed   By: Iven Finn M.D.   On: 10/20/2022 17:05    Microbiology: Results for orders placed or performed during the hospital encounter of 10/20/22  Urine Culture     Status: Abnormal   Collection Time: 10/23/22  7:46 AM   Specimen: Urine, Clean Catch  Result Value Ref Range Status   Specimen Description URINE, CLEAN CATCH  Final   Special Requests   Final    NONE Performed at [facility], [place] [address], [place], [place] [address]    Culture >=100,000 COLONIES/mL ESCHERICHIA COLI (A)  Final   Report Status 10/25/2022 FINAL  Final   Organism ID, Bacteria ESCHERICHIA COLI (A)  Final      Susceptibility   Escherichia coli - MIC*    AMPICILLIN <=2 SENSITIVE Sensitive     CEFAZOLIN <=4 SENSITIVE Sensitive     CEFEPIME <=0.12 SENSITIVE Sensitive     CEFTRIAXONE <=0.25 SENSITIVE Sensitive     CIPROFLOXACIN <=0.25 SENSITIVE Sensitive     GENTAMICIN <=1 SENSITIVE Sensitive     IMIPENEM <=0.25 SENSITIVE Sensitive     NITROFURANTOIN <=16 SENSITIVE Sensitive     TRIMETH/SULFA <=20 SENSITIVE Sensitive     AMPICILLIN/SULBACTAM <=2 SENSITIVE Sensitive     PIP/TAZO <=4 SENSITIVE Sensitive     * >=100,000 COLONIES/mL ESCHERICHIA COLI    Labs: CBC: Recent Labs  Lab 10/21/22 0435 10/22/22 0427 10/23/22 0318 10/25/22 0415  WBC 7.0 7.0 5.6 6.3  HGB 10.0* 8.6* 8.5* 8.5*  HCT 29.4* 25.0* 24.3* 25.3*  MCV 89.6 86.8 85.3 89.4  PLT 212 210 230 177   Basic Metabolic Panel: Recent Labs  Lab 10/21/22 1456 10/22/22 0427 10/23/22 0324 10/24/22 0405 10/25/22 0415 10/26/22 0348 10/27/22 0844  NA  --    < > 141 141 141 140 139  K  --    < > 4.4 4.7 4.5 4.6 4.3  CL  --    < > 112* 107 107  108 108  CO2  --    < > 22 22 22  21* 21*  GLUCOSE  --    < > 93 97 99 92 96  BUN  --    < > 38* 36* 33* 31* 27*  CREATININE  --    < > 4.72* 4.65* 4.38* 4.38* 4.27*  CALCIUM  --    < > 6.6* 7.1* 7.4* 7.8* 8.1*  MG 1.5*  --  1.6*  --  1.6* 2.1  --  PHOS  --   --  1.3* 2.3* 2.9 3.1 3.2   < > = values in this interval not displayed.   Liver Function Tests: Recent Labs  Lab 10/23/22 0324 10/24/22 0405 10/25/22 0415 10/26/22 0348 10/27/22 0844  ALBUMIN 2.6* 2.9* 2.6* 2.6* 2.7*   CBG: No results for input(s): "GLUCAP" in the last 168 hours.  Discharge time spent: greater than 30 minutes.  Signed: Elmarie Shiley, MD [facility] 10/27/2022

## 2022-10-28 DIAGNOSIS — I63512 Cerebral infarction due to unspecified occlusion or stenosis of left middle cerebral artery: Secondary | ICD-10-CM | POA: Diagnosis not present

## 2022-10-28 LAB — COMPREHENSIVE METABOLIC PANEL
ALT: 26 U/L (ref 0–44)
AST: 33 U/L (ref 15–41)
Albumin: 2.6 g/dL — ABNORMAL LOW (ref 3.5–5.0)
Alkaline Phosphatase: 68 U/L (ref 38–126)
Anion gap: 9 (ref 5–15)
BUN: 28 mg/dL — ABNORMAL HIGH (ref 8–23)
CO2: 21 mmol/L — ABNORMAL LOW (ref 22–32)
Calcium: 8.1 mg/dL — ABNORMAL LOW (ref 8.9–10.3)
Chloride: 110 mmol/L (ref 98–111)
Creatinine, Ser: 4.53 mg/dL — ABNORMAL HIGH (ref 0.61–1.24)
GFR, Estimated: 13 mL/min — ABNORMAL LOW (ref >60)
Glucose, Bld: 94 mg/dL (ref 70–99)
Potassium: 4.6 mmol/L (ref 3.5–5.1)
Sodium: 140 mmol/L (ref 135–145)
Total Bilirubin: 0.2 mg/dL — ABNORMAL LOW (ref 0.3–1.2)
Total Protein: 5.8 g/dL — ABNORMAL LOW (ref 6.5–8.1)

## 2022-10-28 LAB — CBC WITH DIFFERENTIAL/PLATELET
Abs Immature Granulocytes: 0.02 10*3/uL (ref 0.00–0.07)
Basophils Absolute: 0 10*3/uL (ref 0.0–0.1)
Basophils Relative: 1 %
Eosinophils Absolute: 0.3 10*3/uL (ref 0.0–0.5)
Eosinophils Relative: 4 %
HCT: 26.2 % — ABNORMAL LOW (ref 39.0–52.0)
Hemoglobin: 8.6 g/dL — ABNORMAL LOW (ref 13.0–17.0)
Immature Granulocytes: 0 %
Lymphocytes Relative: 14 %
Lymphs Abs: 0.8 10*3/uL (ref 0.7–4.0)
MCH: 29.4 pg (ref 26.0–34.0)
MCHC: 32.8 g/dL (ref 30.0–36.0)
MCV: 89.4 fL (ref 80.0–100.0)
Monocytes Absolute: 0.8 10*3/uL (ref 0.1–1.0)
Monocytes Relative: 13 %
Neutro Abs: 4.2 10*3/uL (ref 1.7–7.7)
Neutrophils Relative %: 68 %
Platelets: 310 10*3/uL (ref 150–400)
RBC: 2.93 MIL/uL — ABNORMAL LOW (ref 4.22–5.81)
RDW: 13.4 % (ref 11.5–15.5)
WBC: 6 10*3/uL (ref 4.0–10.5)
nRBC: 0 % (ref 0.0–0.2)

## 2022-10-28 LAB — MAGNESIUM: Magnesium: 1.8 mg/dL (ref 1.7–2.4)

## 2022-10-28 MED ORDER — MILK AND MOLASSES ENEMA: 1.0000 | Freq: Every day | RECTAL | Status: DC | PRN

## 2022-10-28 MED ORDER — SIMETHICONE 80 MG PO CHEW: 80.0000 mg | CHEWABLE_TABLET | Freq: Four times a day (QID) | ORAL | Status: DC | PRN

## 2022-10-28 MED ORDER — ORAL CARE MOUTH RINSE: 15.0000 mL | OROMUCOSAL | Status: DC | PRN

## 2022-10-28 MED ORDER — ALUMINUM HYDROXIDE GEL 320 MG/5ML PO SUSP: 10.0000 mL | Freq: Four times a day (QID) | ORAL | Status: DC | PRN

## 2022-10-28 MED ORDER — ORAL CARE MOUTH RINSE
15.0000 mL | OROMUCOSAL | Status: DC
  Administered 2022-10-29 – 2022-11-12 (×52): 15 mL via OROMUCOSAL

## 2022-10-28 MED ORDER — MAGNESIUM GLUCONATE 500 MG PO TABS
250.0000 mg | ORAL_TABLET | Freq: Every day | ORAL | Status: DC
  Administered 2022-10-28 – 2022-11-12 (×16): 250 mg via ORAL
  Filled 2022-10-28 (×17): qty 1

## 2022-10-28 NOTE — Plan of Care (Signed)
  Problem: RH Swallowing Goal: LTG Patient will consume least restrictive diet using compensatory strategies with assistance (SLP) Description: LTG:  Patient will consume least restrictive diet using compensatory strategies with assistance (SLP) Flowsheets (Taken 10/28/2022 1603) LTG: Pt Patient will consume least restrictive diet using compensatory strategies with assistance of (SLP): Supervision Goal: LTG Patient will participate in dysphagia therapy to increase swallow function with assistance (SLP) Description: LTG:  Patient will participate in dysphagia therapy to increase swallow function with assistance (SLP) Flowsheets (Taken 10/28/2022 1603) LTG: Pt will participate in dysphagia therapy to increase swallow function with assistance of (SLP): Supervision Goal: LTG Pt will demonstrate functional change in swallow as evidenced by bedside/clinical objective assessment (SLP) Description: LTG: Patient will demonstrate functional change in swallow as evidenced by bedside/clinical objective assessment (SLP) Flowsheets (Taken 10/28/2022 1603) LTG: Patient will demonstrate functional change in swallow as evidenced by bedside/clinical objective assessment: Oral swallow   Problem: RH Expression Communication Goal: LTG Patient will verbally express basic/complex needs(SLP) Description: LTG:  Patient will verbally express basic/complex needs, wants or ideas with cues  (SLP) Flowsheets (Taken 10/28/2022 1603) LTG: Patient will verbally express basic/complex needs, wants or ideas (SLP): Modified Independent Goal: LTG Patient will increase word finding of common (SLP) Description: LTG:  Patient will increase word finding of common objects/daily info/abstract thoughts with cues using compensatory strategies (SLP). Flowsheets (Taken 10/28/2022 1603) LTG: Patient will increase word finding of common (SLP): Modified Independent Patient will use compensatory strategies to increase word finding of: Common  objects   Problem: RH Problem Solving Goal: LTG Patient will demonstrate problem solving for (SLP) Description: LTG:  Patient will demonstrate problem solving for basic/complex daily situations with cues  (SLP) Flowsheets (Taken 10/28/2022 1603) LTG: Patient will demonstrate problem solving for (SLP): Basic daily situations LTG Patient will demonstrate problem solving for: Supervision   Problem: RH Memory Goal: LTG Patient will use memory compensatory aids to (SLP) Description: LTG:  Patient will use memory compensatory aids to recall biographical/new, daily complex information with cues (SLP) Flowsheets (Taken 10/28/2022 1603) LTG: Patient will use memory compensatory aids to (SLP): Supervision   Problem: RH Awareness Goal: LTG: Patient will demonstrate awareness during functional activites type of (SLP) Description: LTG: Patient will demonstrate awareness during functional activites type of (SLP) Flowsheets (Taken 10/28/2022 1603) Patient will demonstrate during cognitive/linguistic activities awareness type of: Intellectual LTG: Patient will demonstrate awareness during cognitive/linguistic activities with assistance of (SLP): Supervision

## 2022-10-28 NOTE — Evaluation (Signed)
Occupational Therapy Assessment and Plan  Patient Details  Name: Daniel [name] MRN: [id number] Date of Birth: Dec 22, 1945  OT Diagnosis: abnormal posture, cognitive deficits, flaccid hemiplegia and hemiparesis, hemiplegia affecting non-dominant side, and muscle weakness (generalized) Rehab Potential: Rehab Potential (ACUTE ONLY): Good ELOS: 2-3 weeks   Today's Date: 10/28/2022 OT Individual Time: 1300-1419 OT Individual Time Calculation (min): 79 min     Hospital Problem: Principal Problem:   Acute ischemic left middle cerebral artery (MCA) stroke ([place])   Past Medical History:  Past Medical History:  Diagnosis Date   Hypertension    Psychosis ([place])    Schizophrenia ([place]) 1974   Stroke ([place]) 2010   Right Hemiparesis   Past Surgical History:  Past Surgical History:  Procedure Laterality Date   FEMUR IM NAIL Right 12/12/2019   Procedure: INTRAMEDULLARY (IM) NAIL FEMORAL;  Surgeon: Renette Butters, MD;  Location: [facility];  Service: Orthopedics;  Laterality: Right;    Assessment & Plan Clinical Impression: Pt is a  76 year old male with medical hx significant for: CVA (with right hemiparesis in 2021), HTN, hyperlipidemia, PVD, CKD stage IIIb-IV, schizophrenia. Pt [facility]22 d/t weakness (resulting in falls out of wheelchair during transfers) x2-3 days prior, right sided facial droop, slurred speech. CT showed SDH. Neurology consulted. MRI revealed bilateral SDH (right greater than left) and new left MCA territory stroke. MRA revealed total occlusion of left carotid artery. Carotid doppler showed total occlusion of left ICA, right ICA 40-59% stenosis. 2D Echo showed EF 60-65%. No surgical intervention recommended for SDH. Nephrology consulted d/t AKI on CKD. Recommended a liter of NS bolus and continue with maintenance sodium bicarbonate fluid. No acute need for dialysis. Therapy evaluations completed and CIR recommended d/t pt's deficits in functional  mobility and ADLs. Patient transferred to CIR on 10/27/2022 .    Patient currently requires max with basic self-care skills secondary to muscle weakness and muscle joint tightness, decreased cardiorespiratoy endurance, abnormal tone, unbalanced muscle activation, motor apraxia, decreased coordination, and decreased motor planning, decreased visual perceptual skills and decreased visual motor skills, decreased midline orientation and decreased attention to right, decreased attention, decreased awareness, decreased problem solving, decreased safety awareness, decreased memory, and delayed processing, and decreased sitting balance, decreased standing balance, decreased postural control, hemiplegia, and decreased balance strategies.  Prior to hospitalization, patient could complete BADLs with modified independent .  Patient will benefit from skilled intervention to decrease level of assist with basic self-care skills, increase independence with basic self-care skills, and increase level of independence with iADL prior to discharge home with care partner.  Anticipate patient will require 24 hour supervision and follow up home health.  OT - End of Session Activity Tolerance: Decreased this session Endurance Deficit: Yes OT Assessment Rehab Potential (ACUTE ONLY): Good OT Patient demonstrates impairments in the following area(s): Balance;Perception;Safety;Cognition;Sensory;Endurance;Vision;Motor OT Basic ADL's Functional Problem(s): Grooming;Bathing;Dressing;Toileting OT Advanced ADL's Functional Problem(s): Light Housekeeping OT Transfers Functional Problem(s): Toilet;Tub/Shower OT Additional Impairment(s): Fuctional Use of Upper Extremity OT Plan OT Intensity: Minimum of 1-2 x/day, 45 to 90 minutes OT Frequency: 5 out of 7 days OT Duration/Estimated Length of Stay: 2-3 weeks OT Treatment/Interventions: Balance/vestibular training;Discharge planning;Functional electrical stimulation;Pain  management;Self Care/advanced ADL retraining;Therapeutic Activities;UE/LE Coordination activities;Cognitive remediation/compensation;Functional mobility training;Patient/family education;Therapeutic Exercise;Skin care/wound managment;DME/adaptive equipment instruction;Neuromuscular re-education;Psychosocial support;Splinting/orthotics;UE/LE Strength taining/ROM;Wheelchair propulsion/positioning;Visual/perceptual remediation/compensation;Community reintegration OT Self Feeding Anticipated Outcome(s): Supervision OT Basic Self-Care Anticipated Outcome(s): min A OT Toileting Anticipated Outcome(s): min A OT Bathroom Transfers Anticipated Outcome(s): CGA OT  Recommendation Patient destination: Home Follow Up Recommendations: Home health OT Equipment Recommended: To be determined   OT Evaluation Skilled Intervention: OT evaluation and intervention initiated with education provided on OT role and goals. Pt received finishing lunch in bed in good spirits and receptive to skilled OT session. Pt requesting bed pant at beginning of session. OT left and returned to room with bed pant with pt having incontinent large bowel movement while OT was out of the room. Pt rolled R/L in bed to assist OT in completing peri care and changing of bed linens. Pt fatigued following rolling R/L. Pt sate EOB mod-max A to complete bathing, dressing, and grooming at levels listed below. Pt attempted squat pivot transfer to wc but was unable to complete d/t fatigue and challenges problem solving body mechanics. Pt back to bed max A. Pt was left resting in bed with call bell in reach, bed alarm on, and all needs met. Pt limited d/t acute on chronic hemiplegia. Further vision and sensory assessments to be completed in functional context.   Precautions/Restrictions  Precautions Precautions: Fall;Other (comment) Precaution Comments: acute on chronic R hemiparesis with R UE and LE posturing/contractures Restrictions Weight Bearing  Restrictions: No General Chart Reviewed: Yes Additional Pertinent History: Previous CVA, acute on hronic renal failure, HTN Family/Caregiver Present: No Vital Signs Therapy Vitals Temp: 98.5 F (36.9 C) Pulse Rate: 82 Resp: 15 BP: (Abnormal) 155/75 Patient Position (if appropriate): Lying Oxygen Therapy SpO2: 98 % O2 Device: Room Air Pain Pain Assessment Pain Scale: 0-10 Pain Score: 0-No pain Home Living/Prior Functioning Home Living Living Arrangements: Other relatives Available Help at Discharge: Family (Pt report's his sister is retired) Type of Home: House Home Access: Ramped entrance Home Layout: One level Bathroom Shower/Tub: Chiropodist: Standard Bathroom Accessibility: Yes  Lives With: Other (Comment) (Sister, [place]) IADL History Homemaking Responsibilities: No Current License: No Mode of Transportation: Family Education: high school Occupation: Retired Prior Function Level of Independence: Needs assistance with homemaking, Requires assistive device for independence, Other (comment)  Able to Take Stairs?: No Driving: No Vocation: Retired Surveyor, mining Baseline Vision/History: 0 No visual deficits Ability to See in Adequate Light: 0 Adequate Patient Visual Report: No change from baseline Vision Assessment?: Vision impaired- to be further tested in functional context Additional Comments: Pt denying changes in vision; Pt unable to track stimulus. Vision to be further assessed Perception  Perception: Impaired Inattention/Neglect: Does not attend to right visual field;Does not attend to right side of body Praxis Praxis: Impaired Praxis Impairment Details: Motor planning Praxis-Other Comments: Praxis to be further assessed in functional context Cognition Cognition Overall Cognitive Status: Impaired/Different from baseline Arousal/Alertness: Awake/alert Orientation Level: Person Memory: Impaired Memory Impairment: Storage deficit;Decreased  recall of new information;Retrieval deficit Attention: Focused;Sustained;Selective Focused Attention: Appears intact Sustained Attention: Appears intact Sustained Attention Impairment: Verbal basic Selective Attention: Impaired Selective Attention Impairment: Verbal basic;Functional basic Awareness: Impaired Awareness Impairment: Intellectual impairment;Emergent impairment Problem Solving: Impaired Problem Solving Impairment: Verbal basic;Functional basic Executive Function: Reasoning;Sequencing;Organizing;Decision Making;Initiating;Self Monitoring;Self Correcting Reasoning: Impaired Sequencing: Impaired Organizing: Impaired Decision Making: Impaired Initiating: Impaired Self Monitoring: Impaired Self Correcting: Impaired Safety/Judgment: Impaired Brief Interview for Mental Status (BIMS) Repetition of Three Words (First Attempt): 3 Temporal Orientation: Year: Correct Temporal Orientation: Month: Accurate within 5 days Temporal Orientation: Day: Correct Recall: "Sock": No, could not recall Recall: "Blue": Yes, after cueing ("a color") Recall: "Bed": No, could not recall BIMS Summary Score: 10 Sensation Sensation Light Touch: Impaired Detail Central sensation comments: unable to feel touch in R  LE and impaired in L LE Light Touch Impaired Details: Absent RLE;Impaired LLE Hot/Cold: Not tested Proprioception: Impaired Detail Proprioception Impaired Details: Impaired RLE;Impaired LLE Stereognosis: Not tested Coordination Gross Motor Movements are Fluid and Coordinated: No Fine Motor Movements are Fluid and Coordinated: No Coordination and Movement Description: significantly impaired due to acute on chronic R hemiplegia and hypertonia Motor  Motor Motor: Hemiplegia;Abnormal tone;Abnormal postural alignment and control;Clonus Motor - Skilled Clinical Observations: Significantly impaired due to aute on chronic R hemiplegia and hypertonia  Trunk/Postural Assessment  Cervical  Assessment Cervical Assessment: Exceptions to [facility] (forward head) Thoracic Assessment Thoracic Assessment: Exceptions to [facility] (rounded shoulders, scoliosis curveture of spine, uneven shoudlers) Lumbar Assessment Lumbar Assessment: Exceptions to [facility] (lateral pelvic tilt) Postural Control Postural Control: Deficits on evaluation Trunk Control: poor Righting Reactions: Impaired Protective Responses: Impaired Postural Limitations: decreased strength, decreased control  Balance Balance Balance Assessed: Yes Static Sitting Balance Static Sitting - Balance Support: Feet supported;Left upper extremity supported Static Sitting - Level of Assistance: 2: Max assist Dynamic Sitting Balance Dynamic Sitting - Balance Support: Feet supported Dynamic Sitting - Level of Assistance: 1: +1 Total assist Dynamic Sitting Balance - Compensations: Pt used LUE to pull self to midline to compensate for R sided weakness Extremity/Trunk Assessment RUE Assessment RUE Assessment: Exceptions to [facility] Active Range of Motion (AROM) Comments: distal 2-/5, proximal 3-/5 RUE Body System: Neuro Brunstrum levels for arm and hand: Arm;Hand Brunstrum level for arm: Stage II Synergy is developing Brunstrum level for hand: Stage I Flaccidity RUE Tone Hypotonic Details: decreased tone in R hand LUE Assessment LUE Assessment: Within Functional Limits Active Range of Motion (AROM) Comments: 5-/5  Care Tool Care Tool Self Care Eating   Eating Assist Level: Supervision/Verbal cueing    Oral Care    Oral Care Assist Level: Moderate Assistance - Patient 50 - 74%    Bathing   Body parts bathed by patient: Right arm;Chest;Right upper leg;Left upper leg Body parts bathed by helper: Left arm;Front perineal area;Buttocks;Right lower leg;Left lower leg;Face;Abdomen   Assist Level: Total Assistance - Patient < 25%    Upper Body Dressing(including orthotics)   What is the patient wearing?: [place] only   Assist Level:  Total Assistance - Patient < 25%    Lower Body Dressing (excluding footwear)   What is the patient wearing?: Hospital gown only Assist for lower body dressing: Total Assistance - Patient < 25%    Putting on/Taking off footwear   What is the patient wearing?: [place] for footwear: Dependent - Patient 0%       Care Tool Toileting Toileting activity   Assist for toileting: Dependent - Patient 0%     Care Tool Bed Mobility Roll left and right activity   Roll left and right assist level: Maximal Assistance - Patient 25 - 49%    Sit to lying activity   Sit to lying assist level: Total Assistance - Patient < 25%    Lying to sitting on side of bed activity   Lying to sitting on side of bed assist level: the ability to move from lying on the back to sitting on the side of the bed with no back support.: Total Assistance - Patient < 25%     Care Tool Transfers Sit to stand transfer Sit to stand activity did not occur: Safety/medical concerns (Pt unable to safely complete stand d/t deficits from recent and previous CVAs)      Chair/bed transfer  Materials engineer transfer activity did not occur: Safety/medical concerns (Pt unable to safely complete stand d/t deficits from recent and previous CVAs. Pt currently incontinent of bowel and bladder with foley cath and brief)       Care Tool Cognition  Expression of Ideas and Wants Expression of Ideas and Wants: 2. Frequent difficulty - frequently exhibits difficulty with expressing needs and ideas  Understanding Verbal and Non-Verbal Content Understanding Verbal and Non-Verbal Content: 3. Usually understands - understands most conversations, but misses some part/intent of message. Requires cues at times to understand   Memory/Recall Ability Memory/Recall Ability : Current season;That he or she is in a hospital/hospital unit   Refer to Care Plan for [place] 1 OT Short Term Goal 1 (Week 1):  Pt will maintian sitting balance min A for 3 minutes OT Short Term Goal 2 (Week 1): Pt will perform LB dressing mod A OT Short Term Goal 3 (Week 1): Pt will perform grooming mod A OT Short Term Goal 4 (Week 1): Further assess vision in functional context  Recommendations for other services: None    Skilled Therapeutic Intervention ADL ADL Eating: Supervision/safety Where Assessed-Eating: Bed level Grooming: Maximal assistance;Maximal cueing Where Assessed-Grooming: Edge of bed Upper Body Bathing: Maximal assistance Where Assessed-Upper Body Bathing: Edge of bed Lower Body Bathing: Dependent Where Assessed-Lower Body Bathing: Edge of bed Upper Body Dressing: Maximal assistance Where Assessed-Upper Body Dressing: Edge of bed Lower Body Dressing: Dependent Where Assessed-Lower Body Dressing: Edge of bed Toileting: Dependent Where Assessed-Toileting: Other (Comment) (Condom cath and incontinent of bowel) Toilet Transfer: Unable to assess Toilet Transfer Method: Unable to assess Social research officer, government: Unable to assess Social research officer, government Method: Unable to assess Mobility  Bed Mobility Bed Mobility: Rolling Right;Rolling Left;Supine to Sit;Sit to Supine Rolling Right: Moderate Assistance - Patient 50-74% Rolling Left: Total Assistance - Patient < 25% Supine to Sit: Total Assistance - Patient < 25% Sit to Supine: Maximal Assistance - Patient 25-49%   Discharge Criteria: Patient will be discharged from OT if patient refuses treatment 3 consecutive times without medical reason, if treatment goals not met, if there is a change in medical status, if patient makes no progress towards goals or if patient is discharged from hospital.  The above assessment, treatment plan, treatment alternatives and goals were discussed and mutually agreed upon: by patient  Janey Genta 10/28/2022, 4:42 PM

## 2022-10-28 NOTE — Plan of Care (Signed)
  Problem: RH Balance Goal: LTG: Patient will maintain dynamic sitting balance (OT) Description: LTG:  Patient will maintain dynamic sitting balance with assistance during activities of daily living (OT) Flowsheets (Taken 10/28/2022 1655) LTG: Pt will maintain dynamic sitting balance during ADLs with: Contact Guard/Touching assist   Problem: RH Grooming Goal: LTG Patient will perform grooming w/assist,cues/equip (OT) Description: LTG: Patient will perform grooming with assist, with/without cues using equipment (OT) Flowsheets (Taken 10/28/2022 1656) LTG: Pt will perform grooming with assistance level of: Minimal Assistance - Patient > 75%   Problem: RH Bathing Goal: LTG Patient will bathe all body parts with assist levels (OT) Description: LTG: Patient will bathe all body parts with assist levels (OT) Flowsheets (Taken 10/28/2022 1656) LTG: Pt will perform bathing with assistance level/cueing: Minimal Assistance - Patient > 75% LTG: Position pt will perform bathing: Shower   Problem: RH Dressing Goal: LTG Patient will perform upper body dressing (OT) Description: LTG Patient will perform upper body dressing with assist, with/without cues (OT). Flowsheets (Taken 10/28/2022 1656) LTG: Pt will perform upper body dressing with assistance level of: Minimal Assistance - Patient > 75% Goal: LTG Patient will perform lower body dressing w/assist (OT) Description: LTG: Patient will perform lower body dressing with assist, with/without cues in positioning using equipment (OT) Flowsheets (Taken 10/28/2022 1656) LTG: Pt will perform lower body dressing with assistance level of: Minimal Assistance - Patient > 75%   Problem: RH Toileting Goal: LTG Patient will perform toileting task (3/3 steps) with assistance level (OT) Description: LTG: Patient will perform toileting task (3/3 steps) with assistance level (OT)  Flowsheets (Taken 10/28/2022 1656) LTG: Pt will perform toileting task (3/3 steps)  with assistance level: Minimal Assistance - Patient > 75%   Problem: RH Functional Use of Upper Extremity Goal: LTG Patient will use RT/LT upper extremity as a (OT) Description: LTG: Patient will use right/left upper extremity as a stabilizer/gross assist/diminished/nondominant/dominant level with assist, with/without cues during functional activity (OT) Flowsheets (Taken 10/28/2022 1656) LTG: Use of upper extremity in functional activities: RUE as gross assist level LTG: Pt will use upper extremity in functional activity with assistance level of: Minimal Assistance - Patient > 75%   Problem: RH Toilet Transfers Goal: LTG Patient will perform toilet transfers w/assist (OT) Description: LTG: Patient will perform toilet transfers with assist, with/without cues using equipment (OT) Flowsheets (Taken 10/28/2022 1656) LTG: Pt will perform toilet transfers with assistance level of: Contact Guard/Touching assist   Problem: RH Tub/Shower Transfers Goal: LTG Patient will perform tub/shower transfers w/assist (OT) Description: LTG: Patient will perform tub/shower transfers with assist, with/without cues using equipment (OT) Flowsheets (Taken 10/28/2022 1656) LTG: Pt will perform tub/shower stall transfers with assistance level of: Contact Guard/Touching assist

## 2022-10-28 NOTE — Patient Care Conference (Signed)
Inpatient RehabilitationTeam Conference and Plan of Care Update Date: 10/28/2022   Time: 10:34 AM    Patient Name: Daniel [name]      Medical Record Number: [id number]  Date of Birth: November 18, 1946 Sex: Male         Room/Bed: 4M04C/4M04C-01 Payor Info: Payor: HUMANA MEDICARE / Plan: HUMANA MEDICARE CHOICE PPO / Product Type: *No Product type* /    Admit Date/Time:  10/27/2022  5:04 PM  Primary Diagnosis:  Acute ischemic left middle cerebral artery (MCA) stroke [facility])  Hospital Problems: Principal Problem:   Acute ischemic left middle cerebral artery (MCA) stroke [facility])    Expected Discharge Date: Expected Discharge Date:  (evals pending)  Team Members Present: Physician leading conference: Dr. Jennye Boroughs Social Worker Present: Erlene Quan, BSW Nurse Present: Dorien Chihuahua, RN PT Present: Page Spiro, PT OT Present: Other (comment) Cecille Rubin potter, OT) SLP Present: Early Osmond, SLP PPS Coordinator present : Gunnar Fusi, SLP     Current Status/Progress Goal Weekly Team Focus  Bowel/Bladder   incontinent b/b. L BM 10/27/22   regain bowel/bladder function   assist with timed toileting    Swallow/Nutrition/ Hydration               ADL's   Eval pending            Mobility   eval pending           Communication                Safety/Cognition/ Behavioral Observations  eval pending            Pain   denies pain   pain below 3/10   assess q4 and prn    Skin   redness on sacrum, otherwise grossly intact   prevent new skin breakdown  assess q shift and prn      Discharge Planning:    1 level ramped entry w sister  Team Discussion: Patient Incontinent of bowel and bladder; ABx for UTI and condom cath. CKD/AKI requiring IVF but creatine level trending down per MD. Anemia and HTN monitored per MD. Patient is poor historian with dysarthia, cognitive impairment, poor recall, problem solving deficits, poor attention,   and word finding deficits post acute left MCA CVA.  Patient on target to meet rehab goals: Evals pending  *See Care Plan and progress notes for long and short-term goals.   Revisions to Treatment Plan:  N/a  Teaching Needs: Safety, medications, dietary modifications, transfers, toileting, etc.  Current Barriers to Discharge: Incontinence and Lack of/limited family support  Possible Resolutions to Barriers: Family education with sister SW to follow up on premorbid status/resources in place PTA     Medical Summary Current Status: L mca cva, HTN, Renal failure, UTI, incontinence, congition decreased  Barriers to Discharge: Medical stability;Incontinence;Behavior/Mood;Inadequate Nutritional Intake  Barriers to Discharge Comments: L mca cva, HTN, Renal failure, UTI, incontinence, congition decreased, dysphagia Possible Resolutions to [employer] Focus: abx for uti, monitor bladder function, monitor AI, continue fluids   Continued Need for Acute Rehabilitation Level of Care: The patient requires daily medical management by a physician with specialized training in physical medicine and rehabilitation for the following reasons: Direction of a multidisciplinary physical rehabilitation program to maximize functional independence : Yes Medical management of patient stability for increased activity during participation in an intensive rehabilitation regime.: Yes Analysis of laboratory values and/or radiology reports with any subsequent need for medication adjustment and/or medical intervention. : Yes   I  attest that I was present, lead the team conference, and concur with the assessment and plan of the team.   Margarito Liner 10/28/2022, 3:44 PM

## 2022-10-28 NOTE — Progress Notes (Signed)
PMR Admission Coordinator Pre-Admission Assessment   Patient: Daniel [name] is an 76 y.o., male MRN: [id number] DOB: September 15, 1946 Height: _0  (175.3 cm) Weight: 66.1 kg (This was with bedscaled and this RN zeroed scale before patient was placed in bed.)   Insurance Information HMO: ye    PPO:      PCP:      IPA:      80/20:      OTHER:  PRIMARY: [employer] Medicare  Policy#: [id number]      Subscriber: patient CM Name: Jeanella Craze       Phone#:  [phone]    Fax#: [phone]  [facility] called with approval 10/27/22  for admit 37/04-88/89 Pre-Cert#: [id number]      Employer:  Benefits:  Phone #: Albesa Seen     Name:  Irene Shipper Date: 12/07/2016- 12/06/2022 Deductible: $350 ($0 met) OOP Max: $8,300 ($0 met) CIR: $450/day co-pay with a max co-pay of $1,800/admission (4 days) SNF: $0/day co-pay for days 1-20, $196/day co-pay for days 21-100, limited to 100 days/cal yr  Outpatient:  $20-$40/visit co-pay  Home Health:  100% coverage  SECONDARY:       Policy#:      Phone#:    Development worker, community:       Phone#:    The "Data Collection Information Summary" for patients in Inpatient Rehabilitation Facilities with attached "Privacy Act [place] Records" was provided and verbally reviewed with: Patient   Emergency Contact Information Contact Information       Name Relation Home Work Mobile    [place] Sister [phone]               Current Medical History  Patient Admitting Diagnosis: CVA History of Present Illness: Pt is a  76 year old male with medical hx significant for: CVA (with right hemiparesis in 2021), HTN, hyperlipidemia, PVD, CKD stage IIIb-IV, schizophrenia. Pt [facility] to [facility] on 10/[facility]/22 d/t weakness (resulting in falls out of wheelchair during transfers) x2-3 days prior, right sided facial droop, slurred speech. CT showed SDH. Neurology consulted. MRI revealed bilateral SDH (right greater than left) and new left MCA territory stroke. MRA revealed total  occlusion of left carotid artery. Carotid doppler showed total occlusion of left ICA, right ICA 40-59% stenosis. 2D Echo showed EF 60-65%. No surgical intervention recommended for SDH. Nephrology consulted d/t AKI on CKD. Recommended a liter of NS bolus and continue with maintenance sodium bicarbonate fluid. No acute need for dialysis. Therapy evaluations completed and CIR recommended d/t pt's deficits in functional mobility and ADLs.  Complete NIHSS TOTAL: 10   Patient's medical record from [facility] has been reviewed by the rehabilitation admission coordinator and physician.   Past Medical History      Past Medical History:  Diagnosis Date   Hypertension     Psychosis ([place])     Schizophrenia ([place]) 1974   Stroke ([place]) 2010    Right Hemiparesis      Has the patient had major surgery during 100 days prior to admission? No   Family History   family history includes Breast cancer in his sister; CVA in his sister; Hypertension in his mother; Prostate cancer in his brother.   Current Medications   Current Facility-Administered Medications:     stroke: early stages of recovery book, , Does not apply, Once, Shela Leff, MD   acetaminophen (TYLENOL) tablet 650 mg, 650 mg, Oral, Q6H PRN **OR** acetaminophen (TYLENOL) suppository 650 mg, 650 mg, Rectal, Q6H PRN, Shela Leff,  MD   atorvastatin (LIPITOR) tablet 40 mg, 40 mg, Oral, Daily, Rosalin Hawking, MD, 40 mg at 10/22/22 0935   calcium carbonate (OS-CAL - dosed in mg of elemental calcium) tablet 1,250 mg, 1 tablet, Oral, BID WC, Regalado, Belkys A, MD, 1,250 mg at 46/56/81 2751   folic acid (FOLVITE) tablet 1 mg, 1 mg, Oral, Daily, Rosalin Hawking, MD, 1 mg at 10/22/22 0935   heparin injection 5,000 Units, 5,000 Units, Subcutaneous, Q8H, Rosalin Hawking, MD, 5,000 Units at 10/22/22 0626   hydrALAZINE (APRESOLINE) injection 5 mg, 5 mg, Intravenous, Q6H PRN, Regalado, Belkys A, MD   nystatin (MYCOSTATIN) 100000 UNIT/ML suspension  500,000 Units, 5 mL, Oral, QID, Shela Leff, MD, 500,000 Units at 10/22/22 0935   potassium chloride SA (KLOR-CON M) CR tablet 40 mEq, 40 mEq, Oral, TID, Rosita Fire, MD, 40 mEq at 10/22/22 0935   sodium bicarbonate 75 mEq in sodium chloride 0.45 % 1,075 mL infusion, , Intravenous, Continuous, Rosita Fire, MD, Last Rate: 100 mL/hr at 10/22/22 0953, Rate Change at 10/22/22 0953   sodium bicarbonate tablet 650 mg, 650 mg, Oral, TID, Regalado, Belkys A, MD, 650 mg at 10/22/22 0935   tamsulosin (FLOMAX) capsule 0.4 mg, 0.4 mg, Oral, Daily, Rosalin Hawking, MD, 0.4 mg at 10/22/22 0935   Vitamin D (Ergocalciferol) (DRISDOL) 1.25 MG (50000 UNIT) capsule 50,000 Units, 50,000 Units, Oral, Q7 days, Regalado, Belkys A, MD, 50,000 Units at 10/21/22 1732   Patients Current Diet:  Diet Order                  Diet Heart Room service appropriate? Yes; Fluid consistency: Thin  Diet effective now                         Precautions / Restrictions Precautions Precautions: Fall Restrictions Weight Bearing Restrictions: No    Has the patient had 2 or more falls or a fall with injury in the past year? Yes   Prior Activity Level Limited Community (1-2x/wk): gets out of house for MD appointments   Prior Functional Level Self Care: Did the patient need help bathing, dressing, using the toilet or eating? Independent   Indoor Mobility: Did the patient need assistance with walking from room to room (with or without device)? Pt used a wheelchair   Stairs: Did the patient need assistance with internal or external stairs (with or without device)? Pt used a wheelchair and ramps   Functional Cognition: Did the patient need help planning regular tasks such as shopping or remembering to take medications? Independent   Patient Information Are you of Hispanic, Latino/a,or Spanish origin?: A. No, not of Hispanic, Latino/a, or Spanish origin What is your race?: B. Black or African  American Do you need or want an interpreter to communicate with a doctor or health care staff?: 0. No   Patient's Response To:  Health Literacy and Transportation Is the patient able to respond to health literacy and transportation needs?: Yes Health Literacy - How often do you need to have someone help you when you read instructions, pamphlets, or other written material from your doctor or pharmacy?: Rarely In the past 12 months, has lack of transportation kept you from medical appointments or from getting medications?: No In the past 12 months, has lack of transportation kept you from meetings, work, or from getting things needed for daily living?: No   Development worker, international aid / [place] Devices/Equipment: None Home Equipment: Conservation officer, nature (  2 wheels), Wheelchair - manual   Prior Device Use: Indicate devices/aids used by the patient prior to current illness, exacerbation or injury? Manual wheelchair   Current Functional Level Cognition   Arousal/Alertness: Awake/alert Overall Cognitive Status: Impaired/Different from baseline Orientation Level: Oriented X4 Safety/Judgement: Decreased awareness of deficits, Decreased awareness of safety Attention: Sustained Sustained Attention: Impaired Sustained Attention Impairment: Verbal basic Memory: Impaired Memory Impairment: Storage deficit, Decreased recall of new information, Retrieval deficit Awareness: Impaired Awareness Impairment: Intellectual impairment, Emergent impairment    Extremity Assessment (includes Sensation/Coordination)   Upper Extremity Assessment: RUE deficits/detail, LUE deficits/detail RUE Deficits / Details: trace activation at shoulder, elbow flexors, and finger flexors noted all <2/5, no activation of tricep extensors or wrist flex/ext noted. elbow wrist and digit ROM WFL passively, shoulder flexion limited to ~110 degrees PROM LUE Deficits / Details: grossly 4/5 other than 4-/5 grip strength, slowed  fine motor  Lower Extremity Assessment: LLE deficits/detail, RLE deficits/detail RLE Deficits / Details: R knee with ~15 degree knee flexion contracture, strength grossly 3+/5 through available range. Increased adductor tone and knee flexion tone LLE Deficits / Details: LLE grossly 4-/5, knee extension to ~-5 degrees passively.     ADLs         Mobility   Overal bed mobility: Needs Assistance Bed Mobility: Supine to Sit, Sit to Supine Supine to sit: Mod assist, HOB elevated Sit to supine: Mod assist, HOB elevated General bed mobility comments: cues to assist RLE with LLE, hand hold to pull into sitting with use of LUE     Transfers   Overall transfer level: Needs assistance Equipment used: 1 person hand held assist Transfers: Sit to/from Stand Sit to Stand: Max assist, From elevated surface General transfer comment: R knee block, increased adductor tone noted in standing     Ambulation / Gait / Stairs / Wheelchair Mobility         Posture / Balance Balance Overall balance assessment: Needs assistance Sitting-balance support: No upper extremity supported, Feet supported Sitting balance-Leahy Scale: Fair Standing balance support: Single extremity supported, Reliant on assistive device for balance Standing balance-Leahy Scale: Poor     Special needs/care consideration Continuous Drip IV  azithromycin, rocephin, .9% sodium chloride External urinary catheter and Skin Abrasion: elbow/right    Previous Home Environment (from acute therapy documentation) Living Arrangements: Other relatives (sister)  Lives With: Other (Comment) (sister) Available Help at Discharge: Family, Available 24 hours/day Type of Home: House Home Layout: One level Home Access: Ramped entrance Bathroom Shower/Tub: Chiropodist: Standard Bathroom Accessibility: Yes How Accessible: Accessible via wheelchair, Accessible via walker [place]: No   Discharge Living Setting Plans  for Discharge Living Setting: Patient's home Type of Home at Discharge: House Discharge Home Layout: One level Discharge Home Access: [place] entrance Discharge Bathroom Shower/Tub: [place] unit Discharge Bathroom Toilet: Standard Discharge Bathroom Accessibility: Yes How Accessible: Accessible via wheelchair, Accessible via walker Does the patient have any problems obtaining your medications?: No   Social/Family/Support Systems Anticipated Caregiver: [name], sister Anticipated Caregiver's Contact Information: [phone] Caregiver Availability: 24/7 Discharge Plan Discussed with Primary Caregiver: Yes Is Caregiver In Agreement with Plan?: Yes Does Caregiver/Family have Issues with Lodging/Transportation while Pt is in Rehab?: No   Goals Patient/Family Goal for Rehab: PT/OT/SLP Supervision  Expected length of stay: 12-14 days  Pt/Family Agrees to Admission and willing to participate: Yes Program Orientation Provided & Reviewed with Pt/Caregiver Including Roles  & Responsibilities: Yes   Decrease burden of Care through IP  rehab admission: NA   Possible need for SNF placement upon discharge: Not anticipated   Patient Condition: I have reviewed medical [facility], spoken with CM, and patient and family member. I met with patient at the bedside and discussed via phone for inpatient rehabilitation assessment.  Patient will benefit from ongoing PT, OT, and SLP, can actively participate in 3 hours of therapy a day 5 days of the week, and can make measurable gains during the admission.  Patient will also benefit from the coordinated team approach during an Inpatient Acute Rehabilitation admission.  The patient will receive intensive therapy as well as Rehabilitation physician, nursing, social worker, and care management interventions.  Due to safety, skin/wound care, disease management, medication administration, pain management, and patient education the patient  requires 24 hour a day rehabilitation nursing.  The patient is currently mod A with mobility and basic ADLs.  Discharge setting and therapy post discharge at home with home health is anticipated.  Patient has agreed to participate in the Acute Inpatient Rehabilitation Program and will admit today.   Preadmission Screen Completed By:  Bethel Born, 10/22/2022 11:59 AM ______________________________________________________________________   Discussed status with Dr. Dagoberto Ligas  on 930 at 10/27/22 and received approval for admission today.   Admission Coordinator:  Bethel Born, [place], time 1523/Date 10/27/22    Assessment/Plan: Diagnosis: Does the need for close, 24 hr/day Medical supervision in concert with the patient's rehab needs make it unreasonable for this patient to be served in a less intensive setting? Yes Co-Morbidities requiring supervision/potential complications: Schizophrenia, prior CVA with R hemi and new R hemiplegia due to L MCA stroke; B/L SDH's, ARF- improving- s/p UTI and pneumonia; BPH, HTN, CKD 4 at baseline Due to bladder management, bowel management, safety, skin/wound care, disease management, medication administration, pain management, and patient education, does the patient require 24 hr/day rehab nursing? Yes Does the patient require coordinated care of a physician, rehab nurse, PT, OT, and SLP to address physical and functional deficits in the context of the above medical diagnosis(es)? Yes Addressing deficits in the following areas: balance, endurance, locomotion, strength, transferring, bowel/bladder control, bathing, dressing, feeding, grooming, toileting, cognition, speech, language, and swallowing Can the patient actively participate in an intensive therapy program of at least 3 hrs of therapy 5 days a week? Yes The potential for patient to make measurable gains while on inpatient rehab is good and fair Anticipated functional outcomes upon discharge  from inpatient rehab: supervision PT, supervision OT, supervision SLP Estimated rehab length of stay to reach the above functional goals is: 14-18 days Anticipated discharge destination: Home 10. Overall Rehab/Functional Prognosis: good and fair     MD Signature:

## 2022-10-28 NOTE — Evaluation (Signed)
Physical Therapy Assessment and Plan  Patient Details  Name: Daniel [name] MRN: [id number] Date of Birth: May 19, 1946  PT Diagnosis: Abnormal posture, Abnormality of gait, Cognitive deficits, Difficulty walking, Hemiparesis non-dominant, Hypertonia, Impaired sensation, and Muscle weakness Rehab Potential: Good ELOS: 2-3 weeks   Today's Date: 10/28/2022 PT Individual Time: 1638-4665 PT Individual Time Calculation (min): 77 min    Hospital Problem: Principal Problem:   Acute ischemic left middle cerebral artery (MCA) stroke ([place])   Past Medical History:  Past Medical History:  Diagnosis Date   Hypertension    Psychosis ([place])    Schizophrenia ([place]) 1974   Stroke ([place]) 2010   Right Hemiparesis   Past Surgical History:  Past Surgical History:  Procedure Laterality Date   FEMUR IM NAIL Right 12/12/2019   Procedure: INTRAMEDULLARY (IM) NAIL FEMORAL;  Surgeon: Renette Butters, MD;  Location: [facility];  Service: Orthopedics;  Laterality: Right;    Assessment & Plan Clinical Impression: Patient is a 76 y.o.  L handed male with history of HTN, hyperlipidemia, stroke w/residual R-HP, CKD-baseline SCr-2.0-2.3 range, question hx of schizophrenia, BPH who was admitted on 10/20/22 with right facial droop and slurred speech. He was found to have acute on chronic renal failure with BUN 41, SCr 5.8, bicarb 19 with mild metabolic acidosis as well as hypokalemia with K-2.9. patient reported multiple falls and worsening of weakness 2 days PTA. MRI brain done revealing acute ischemia in L-MCA territory and R>L SDH, generalized volume loss and encephalomalacia at prior L-ACA infarct. MRA showed occlusion of left ICA proximal to skull base with diminished flow enhancement in L-MCA. Carotid dopplers showed total occlusion of L-ICA, and bilateral ECA with > 50% stenosis.   Dr. Curly Shores felt that stroke was due to carotid stenosis which had progressed to total occlusion. Dr. Marcello Moores consulted and felt that no  surgical intervention or follow up needed and recommended avoiding  DAPT and ASA alone likely safe. Dr. Erlinda Hong recommended repeat CT head which was stable and ASA added on 11/17.     2 D echo showed EF 60-65% with mild LVH. Renal ultrasound showed medical renal disease with minimal to mild interval volume loss of both kidneys. Patient treated with bicarb, potasium supplemented and foley placed with 500 cc urine return.  Dr. Carolin Sicks consulted and recommended fluid bolus as patient looked dry followed by IV bicarb and monitoring magnesium level.  He was treated with Zithromax for RML infiltrate concerning for PNA and as urine culture 11/17 showed >100,000 colonies of Ecoli ws started on IV ceftriaxone on 11/18. Intermittent fevers are resolving and  Hypocalcemia corrects to near normal and phosphate level normal. SCr improving and nephrology recommends continuing IVF till intake stable and consider stopping Sodium bicarb if serum bicarb> 24. Patient transferred to CIR on 10/27/2022 .   Patient currently requires mod assist with mobility secondary to muscle weakness and muscle joint tightness, decreased cardiorespiratoy endurance, impaired timing and sequencing, abnormal tone, and unbalanced muscle activation, decreased motor planning, decreased attention, decreased problem solving, and delayed processing, and decreased sitting balance, decreased standing balance, decreased postural control, hemiplegia, and decreased balance strategies.  Prior to hospitalization, patient was modified independent  with mobility at wheelchair level and lived with Other (Comment) (sister) in a House home.  Home access is  Ramped entrance.  Patient will benefit from skilled PT intervention to maximize safe functional mobility, minimize fall risk, and decrease caregiver burden for planned discharge home with 24 hour assist.  Anticipate patient will benefit  from follow up [place] at discharge.  PT - End of Session Activity Tolerance:  Tolerates 30+ min activity with multiple rests Endurance Deficit: Yes Endurance Deficit Description: requires rest breaks PT Assessment Rehab Potential (ACUTE/IP ONLY): Good PT Barriers to Discharge: Decreased caregiver support;Neurogenic Bowel & Bladder;Incontinence;Lack of/limited family support PT Patient demonstrates impairments in the following area(s): Balance;Perception;Behavior;Safety;Edema;Sensory;Endurance;Skin Integrity;Motor;Nutrition;Pain PT Transfers Functional Problem(s): Bed Mobility;Bed to Chair;Car;Furniture PT Locomotion Functional Problem(s): Ambulation;Wheelchair Mobility PT Plan PT Intensity: Minimum of 1-2 x/day ,45 to 90 minutes PT Frequency: 5 out of 7 days PT Duration Estimated Length of Stay: 2-3 weeks PT Treatment/Interventions: Neuromuscular re-education;Ambulation/gait training;Community reintegration;DME/adaptive equipment instruction;Psychosocial support;UE/LE Strength taining/ROM;Wheelchair propulsion/positioning;Balance/vestibular training;Discharge planning;Functional electrical stimulation;Pain management;Skin care/wound management;Therapeutic Activities;UE/LE Coordination activities;Cognitive remediation/compensation;Disease management/prevention;Functional mobility training;Patient/family education;Splinting/orthotics;Therapeutic Exercise;Visual/perceptual remediation/compensation PT Transfers Anticipated Outcome(s): CGA using LRAD PT Locomotion Anticipated Outcome(s): do not anticipate pt will be a functional ambulator (did not ambulate at baseline) PT Recommendation Follow Up Recommendations: Home health PT;24 hour supervision/assistance Patient destination: Home Equipment Recommended: Other (comment) Equipment Details: custom manual wheelchair evaluation   PT Evaluation Precautions/Restrictions  Precautions Precautions: Fall;Other (comment) Precaution Comments: acute on chronic R hemiparesis with R UE and LE  posturing/contractures Restrictions Weight Bearing Restrictions: No Pain Pain Assessment Pain Scale: 0-10 Pain Score: 0-No pain Pain Interference Pain Interference Pain Effect on Sleep: 1. Rarely or not at all Pain Interference with Therapy Activities: 1. Rarely or not at all Pain Interference with Day-to-Day Activities: 1. Rarely or not at all Home Living/Prior [place] Available Help at Discharge: Family (pt's reports his sister is retired) Type of Home: House Home Access: Cashiers: One level  Lives With: Other (Comment) (sister, [place]) Prior Function Level of Independence: Needs assistance with homemaking;Requires assistive device for independence;Other (comment) (pt reports he was mod-I performing squat pivots from regular bed<>wheelchair and was mod-I from w/c level - pt states he has not walked in 3 years)  Able to Take Stairs?: No Driving: No Vision/Perception  Vision - History Ability to See in Adequate Light: 0 Adequate Perception Perception: Impaired Inattention/Neglect: Does not attend to right visual field Praxis Praxis: Impaired Praxis Impairment Details: Motor planning Praxis-Other Comments: Praxis to be further assessed in functional context  Cognition  Overall Cognitive Status: Impaired/Different from baseline Arousal/Alertness: Awake/alert Orientation Level: Oriented X4 Year: 2023 Month: November Day of Week: Correct Attention: Focused;Sustained;Selective Focused Attention: Appears intact Sustained Attention: Appears intact Sustained Attention Impairment: Verbal basic Selective Attention: Impaired Memory: Impaired Safety/Judgment: Appears intact Sensation Sensation Light Touch: Impaired Detail Central sensation comments: unable to feel touch in R LE and impaired in L LE Light Touch Impaired Details: Absent RLE;Impaired LLE Hot/Cold: Not tested Proprioception: Impaired Detail Proprioception Impaired Details:  Impaired RLE;Impaired LLE Stereognosis: Not tested Coordination Gross Motor Movements are Fluid and Coordinated: No Fine Motor Movements are Fluid and Coordinated: No Coordination and Movement Description: significantly impaired due to acute on chronic R hemiplegia and with flexion contractures Motor  Motor Motor: Hemiplegia;Abnormal tone;Abnormal postural alignment and control Motor - Skilled Clinical Observations: Significantly impaired due to aute on chronic R hemiplegia with flexion contractures   Trunk/Postural Assessment  Cervical Assessment Cervical Assessment: Exceptions to [facility] (cervical rotation towards L with forward head) Thoracic Assessment Thoracic Assessment: Exceptions to [facility] (rounded shoulders, scoliosis curveture of spine, uneven shoulders) Lumbar Assessment Lumbar Assessment: Exceptions to [facility] (pelvic obliquity with windswept posturing) Postural Control Postural Control: Deficits on evaluation Trunk Control: poor Righting Reactions: Impaired Protective Responses: Impaired Postural Limitations: decreased  Balance Balance Balance  Assessed: Yes Static Sitting Balance Static Sitting - Balance Support: Feet supported;Left upper extremity supported Static Sitting - Level of Assistance: Other (comment) (CGA) Dynamic Sitting Balance Dynamic Sitting - Balance Support: Feet supported;Left upper extremity supported Dynamic Sitting - Level of Assistance: 4: Min assist Dynamic Sitting Balance - Compensations: Pt used LUE to pull self to midline to compensate for R sided weakness Static Standing Balance Static Standing - Balance Support: During functional activity;Bilateral upper extremity supported Static Standing - Level of Assistance: 3: Mod assist Extremity Assessment      RLE Assessment RLE Assessment: Exceptions to [facility] Passive Range of Motion (PROM) Comments: pt with contractures limiting hip extesion (lacking ~15degrees) and knee extension (lacking ~20degrees)  with significant windswept hip posturing with knees out towards R (even in supine); limited ankle platarflexion ROM but full DF General Strength Comments: assessed in supine - able to move against gravity but not when resistance applied RLE Strength Right Hip Flexion: 3/5 Right Hip Extension: 3/5 Right Knee Flexion: 3/5 Right Knee Extension: 3/5 Right Ankle Dorsiflexion: 0/5 Right Ankle Plantar Flexion: 0/5 LLE Assessment LLE Assessment: Exceptions to [facility] Passive Range of Motion (PROM) Comments: limited hip external rotation and abduction with pt in a very windswept position with knees going towards his R General Strength Comments: assessed in supine, pt with some difficulty motor planning how to move LE in direction requested LLE Strength Left Hip Flexion: 4-/5 Left Knee Flexion: 4-/5 Left Knee Extension: 3+/5 Left Ankle Dorsiflexion: 3+/5 Left Ankle Plantar Flexion: 3+/5  Care Tool Care Tool Bed Mobility Roll left and right activity   Roll left and right assist level: Maximal Assistance - Patient 25 - 49%    Sit to lying activity   Sit to lying assist level: Moderate Assistance - Patient 50 - 74%    Lying to sitting on side of bed activity   Lying to sitting on side of bed assist level: the ability to move from lying on the back to sitting on the side of the bed with no back support.: Moderate Assistance - Patient 50 - 74%     Care Tool Transfers Sit to stand transfer   Sit to stand assist level: Moderate Assistance - Patient 50 - 74%    Chair/bed transfer   Chair/bed transfer assist level: Moderate Assistance - Patient 50 - 74%     Psychologist, counselling transfer activity did not occur: N/A (pt uses wheelchair transportation at baseline)        Care Tool Locomotion Ambulation Ambulation activity did not occur: Safety/medical concerns (pt has not ambulated in 3 years, uses w/c for mobility baseline)        Walk 10 feet activity Walk 10 feet  activity did not occur: Safety/medical concerns       Walk 50 feet with 2 turns activity Walk 50 feet with 2 turns activity did not occur: Safety/medical concerns      Walk 150 feet activity Walk 150 feet activity did not occur: Safety/medical concerns      Walk 10 feet on uneven surfaces activity Walk 10 feet on uneven surfaces activity did not occur: Safety/medical concerns      Stairs Stair activity did not occur: N/A (does not ambulate baseline)        Walk up/down 1 step activity Walk up/down 1 step or curb (drop down) activity did not occur: N/A      Walk up/down 4 steps activity Walk up/down  4 steps activity did not occur: N/A      Walk up/down 12 steps activity Walk up/down 12 steps activity did not occur: N/A      Pick up small objects from floor   Pick up small object from the floor assist level: Total Assistance - Patient < 25% (pt states he never just stands up at baseline)    Wheelchair Is the patient using a wheelchair?: Yes Type of Wheelchair: Manual   Wheelchair assist level: Maximal Assistance - Patient 25 - 49% Max wheelchair distance: 110f  Wheel 50 feet with 2 turns activity   Assist Level: Maximal Assistance - Patient 25 - 49%  Wheel 150 feet activity   Assist Level: Maximal Assistance - Patient 25 - 49%    Refer to Care Plan for Long Term Goals  SHORT TERM GOAL WEEK 1 PT Short Term Goal 1 (Week 1): Pt will perform supine<>sit with min assist using bed features as needed PT Short Term Goal 2 (Week 1): Pt will perform sit<>stand transfers using LRAD with min assist PT Short Term Goal 3 (Week 1): Pt will perform bed<>wheelchair transfers using LRAD with min assist consistently PT Short Term Goal 4 (Week 1): Pt will perform at least 567fwheelchair mobility with no more than min assist  Recommendations for other services: None   Skilled Therapeutic Intervention Pt received supine in bed and agreeable to therapy session. Evaluation completed (see  details above) with patient education regarding purpose of PT evaluation, PT POC and goals, therapy schedule, weekly team meetings, and other CIR information including safety plan and fall risk safety. Pt performed the below functional mobility tasks with the specified levels of skilled cuing and assistance. Pt with residual R hemiparesis with flexor synergy pattern contractures developed in both UE and LE that is now exacerbated due to new CVA - pt with limited hip/knee extension ROM in LE as well as decreased ankle plantarflexion ROM. Pt also with significant impairments in his cervical, thoracic, and lumbar spinal alignments as well as a significant windswept posturing in bed (knees moving towards R) but then windswept towards L when in wheelchair due to limited R hip abduction and external rotation ROM. Pt able to perform mobility tasks with decreased assistance when given time and space to initiate his normal movement patterns. At end of session, pt left supine in bed with needs in reach and bed alarm on.  Mobility Bed Mobility Bed Mobility: Supine to Sit;Sit to Supine Rolling Right: Moderate Assistance - Patient 50-74% Rolling Left: Total Assistance - Patient < 25% Supine to Sit: Moderate Assistance - Patient 50-74% Sit to Supine: Moderate Assistance - Patient 50-74% Transfers Transfers: Sit to Stand;Stand to Sit;Squat Pivot Transfers Sit to Stand: Moderate Assistance - Patient 50-74% Stand to Sit: Moderate Assistance - Patient 50-74% (from mat - therapist guarding R knee but no buckling, pt does stay in a flexed trunk/hip/knee, crouched posture in stance) Squat Pivot Transfers: Moderate Assistance - Patient 50-74% (cuing for head/hips relationship, manual facilitation for R LE positioning due to flexor synergy pattern, requires increased assist transfering towards R compared to L (especially to rotate hips)) Transfer (Assistive device): Rolling walker (for sit<>stand) Locomotion   Gait Ambulation: No (pt unable to ambulate at baseline and no +2 assist to safely attempt at this time if pt interested in particpating in gait training) Gait Gait: No Stairs / Additional Locomotion Stairs: No Wheelchair Mobility Wheelchair Mobility: Yes Wheelchair Assistance: Maximal Assistance - Patient 25 - 49% Wheelchair Propulsion: Left  lower extremity;Left upper extremity Wheelchair Parts Management: Needs assistance Distance: 15f   Discharge Criteria: Patient will be discharged from PT if patient refuses treatment 3 consecutive times without medical reason, if treatment goals not met, if there is a change in medical status, if patient makes no progress towards goals or if patient is discharged from hospital.  The above assessment, treatment plan, treatment alternatives and goals were discussed and mutually agreed upon: by patient  CTawana Scale, PT, DPT, NCS, CSRS 10/28/2022, 10:35 AM

## 2022-10-28 NOTE — Progress Notes (Signed)
Inpatient [facility] Individual Statement of Services  Patient Name:  Daniel [name]  Date:  10/28/2022  Welcome to the [facility].  Our goal is to provide you with an individualized program based on your diagnosis and situation, designed to meet your specific needs.  With this comprehensive rehabilitation program, you will be expected to participate in at least 3 hours of rehabilitation therapies Monday-Friday, with modified therapy programming on the weekends.  Your rehabilitation program will include the following services:  Physical Therapy (PT), Occupational Therapy (OT), Speech Therapy (ST), 24 hour per day rehabilitation nursing, Therapeutic Recreaction (TR), Neuropsychology, Care Coordinator, Rehabilitation Medicine, Nutrition Services, Pharmacy Services, and Other  Weekly team conferences will be held on Wednesdays to discuss your progress.  Your Inpatient Rehabilitation Care Coordinator will talk with you frequently to get your input and to update you on team discussions.  Team conferences with you and your family in attendance may also be held.  Expected length of stay:  12-14 Days  Overall anticipated outcome:  Supervision  Depending on your progress and recovery, your program may change. Your Inpatient Rehabilitation Care Coordinator will coordinate services and will keep you informed of any changes. Your Inpatient Rehabilitation Care Coordinator's name and contact numbers are listed  below.  The following services may also be recommended but are not provided by the [facility]:   [place] will be made to provide these services after discharge if needed.  Arrangements include referral to agencies that provide these services.  Your insurance has been verified to be:   [employer] Your primary doctor is:  NO PCP  Pertinent information will be  shared with your doctor and your insurance company.  Inpatient Rehabilitation Care Coordinator:  Erlene Quan, [place] or [phone]  Information discussed with and copy given to patient by: [name], 10/28/2022, 9:09 AM

## 2022-10-28 NOTE — Plan of Care (Signed)
  Problem: RH Bed Mobility Goal: LTG Patient will perform bed mobility with assist (PT) Description: LTG: Patient will perform bed mobility with assistance, with/without cues (PT). Flowsheets (Taken 10/28/2022 1828) LTG: Pt will perform bed mobility with assistance level of: Supervision/Verbal cueing   Problem: RH Balance Goal: LTG Patient will maintain dynamic sitting balance (PT) Description: LTG:  Patient will maintain dynamic sitting balance with assistance during mobility activities (PT) Flowsheets (Taken 10/28/2022 1828) LTG: Pt will maintain dynamic sitting balance during mobility activities with:: Supervision/Verbal cueing Goal: LTG Patient will maintain dynamic standing balance (PT) Description: LTG:  Patient will maintain dynamic standing balance with assistance during mobility activities (PT) Flowsheets (Taken 10/28/2022 1828) LTG: Pt will maintain dynamic standing balance during mobility activities with:: Minimal Assistance - Patient > 75%   Problem: Sit to Stand Goal: LTG:  Patient will perform sit to stand with assistance level (PT) Description: LTG:  Patient will perform sit to stand with assistance level (PT) Flowsheets (Taken 10/28/2022 1828) LTG: PT will perform sit to stand in preparation for functional mobility with assistance level: Contact Guard/Touching assist   Problem: RH Bed Mobility Goal: LTG Patient will perform bed mobility with assist (PT) Description: LTG: Patient will perform bed mobility with assistance, with/without cues (PT). Flowsheets (Taken 10/28/2022 1828) LTG: Pt will perform bed mobility with assistance level of: Supervision/Verbal cueing   Problem: RH Bed to Chair Transfers Goal: LTG Patient will perform bed/chair transfers w/assist (PT) Description: LTG: Patient will perform bed to chair transfers with assistance (PT). Flowsheets (Taken 10/28/2022 1828) LTG: Pt will perform Bed to Chair Transfers with assistance level: Contact Guard/Touching  assist   Problem: RH Wheelchair Mobility Goal: LTG Patient will propel w/c in controlled environment (PT) Description: LTG: Patient will propel wheelchair in controlled environment, # of feet with assist (PT) Flowsheets (Taken 10/28/2022 1828) LTG: Pt will propel w/c in controlled environ  assist needed:: Supervision/Verbal cueing LTG: Propel w/c distance in controlled environment: 42ft Goal: LTG Patient will propel w/c in home environment (PT) Description: LTG: Patient will propel wheelchair in home environment, # of feet with assistance (PT). Flowsheets (Taken 10/28/2022 1828) LTG: Pt will propel w/c in home environ  assist needed:: Supervision/Verbal cueing LTG: Propel w/c distance in home environment: 45ft

## 2022-10-28 NOTE — Progress Notes (Signed)
PROGRESS NOTE   Subjective/Complaints:  Pt reports no issues Slept OK dLBM was yesterday he thinks- doesn't feel constipated.    ROS:  Pt denies SOB, abd pain, CP, N/V/C/D, and vision changes   Objective:   No results found. Recent Labs    10/28/22 0549  WBC 6.0  HGB 8.6*  HCT 26.2*  PLT 310   Recent Labs    10/27/22 0844 10/28/22 0549  NA 139 140  K 4.3 4.6  CL 108 110  CO2 21* 21*  GLUCOSE 96 94  BUN 27* 28*  CREATININE 4.27* 4.53*  CALCIUM 8.1* 8.1*    Intake/Output Summary (Last 24 hours) at 10/28/2022 1857 Last data filed at 10/28/2022 1824 Gross per 24 hour  Intake 2136.25 ml  Output 1700 ml  Net 436.25 ml        Physical Exam: Vital Signs Blood pressure (!) 155/75, pulse 82, temperature 98.5 F (36.9 C), resp. rate 15, height 5\' 6"  (1.676 m), weight 68.7 kg, SpO2 98 %.   General: awake, alert, appropriate, sitting up in bedside chair; wrapped in blanket; NAD HENT: conjugate gaze; oropharynx moist; poor dentition CV: regular rate; no JVD Pulmonary: CTA B/L; no W/R/R- good air movement GI: soft, NT, ND, (+)BS- normoactive Psychiatric: appropriate Neurological: very dysarthric- mildly aphasic; alert Musculoskeletal:     Cervical back: Neck supple. No tenderness.     Comments: RUE- proximal 3/5; distal 2/5 LUE 5-/5 in same muscles RLE- HF 3/5; DF/PF 2/5- pt couldn't understand to test KE/KF LLE- 5-/5 in same muscles- KE/KF NT  Skin:    General: Skin is warm and dry.  Neurological:     Mental Status: He is alert.     Comments: Severe dysarthria, as well as some aphasia- sentence level Hoffman's RUE 2-3 beats clonus RLE Face intact to light touch, but decreased entire R side   Assessment/Plan: 1. Functional deficits which require 3+ hours per day of interdisciplinary therapy in a comprehensive inpatient rehab setting. Physiatrist is providing close team supervision and 24 hour  management of active medical problems listed below. Physiatrist and rehab team continue to assess barriers to discharge/monitor patient progress toward functional and medical goals  Care Tool:  Bathing    Body parts bathed by patient: Right arm, Chest, Right upper leg, Left upper leg   Body parts bathed by helper: Left arm, Front perineal area, Buttocks, Right lower leg, Left lower leg, Face, Abdomen     Bathing assist Assist Level: Total Assistance - Patient < 25%     Upper Body Dressing/Undressing Upper body dressing   What is the patient wearing?: Hospital gown only    Upper body assist Assist Level: Total Assistance - Patient < 25%    Lower Body Dressing/Undressing Lower body dressing      What is the patient wearing?: Hospital gown only     Lower body assist Assist for lower body dressing: Total Assistance - Patient < 25%     Toileting Toileting    Toileting assist Assist for toileting: Dependent - Patient 0%     Transfers Chair/bed transfer  Transfers assist     Chair/bed transfer assist level: Moderate Assistance - Patient  50 - 74%     Locomotion Ambulation   Ambulation assist   Ambulation activity did not occur: Safety/medical concerns (pt has not ambulated in 3 years, uses w/c for mobility baseline)          Walk 10 feet activity   Assist  Walk 10 feet activity did not occur: Safety/medical concerns        Walk 50 feet activity   Assist Walk 50 feet with 2 turns activity did not occur: Safety/medical concerns         Walk 150 feet activity   Assist Walk 150 feet activity did not occur: Safety/medical concerns         Walk 10 feet on uneven surface  activity   Assist Walk 10 feet on uneven surfaces activity did not occur: Safety/medical concerns         Wheelchair     Assist Is the patient using a wheelchair?: Yes Type of Wheelchair: Manual    Wheelchair assist level: Maximal Assistance - Patient 25 -  49% Max wheelchair distance: 181ft    Wheelchair 50 feet with 2 turns activity    Assist        Assist Level: Maximal Assistance - Patient 25 - 49%   Wheelchair 150 feet activity     Assist      Assist Level: Maximal Assistance - Patient 25 - 49%   Blood pressure (!) 155/75, pulse 82, temperature 98.5 F (36.9 C), resp. rate 15, height 5\' 6"  (1.676 m), weight 68.7 kg, SpO2 98 %.  Medical Problem List and Plan: 1. Functional deficits secondary to L MCA stroke with R sided weakness in setting of prior R hemiparesis             -patient may  shower             -ELOS/Goals: 12- 14 days supervision  First days of evaluations-con't CIR-PT, OT and SLP  2.  Antithrombotics: -DVT/anticoagulation:  Pharmaceutical: Lovenox             -antiplatelet therapy: ASA 3. Pain Management: Tylenol prn.  4. Mood/Behavior/Sleep: LCSW to follow for evaluation and support.              --trazodone prn for insomnia.              -antipsychotic agents: N/A 5. Neuropsych/cognition: This patient is? capable of making decisions on his own behalf. 6. Skin/Wound Care: Routine pressure relief measures. And skin /wound care due to incontinence 7. Fluids/Electrolytes/Nutrition: Monitor I/O. Check CMET in am.             --continue IVF and bicarb for now. Check Mg level in am. 8. HTN: Monitor BP TID--continues to be labile. On hydralazine and amlodipine             --off ace due to acute on chronic renal failure  11/22- BP a little elevated- will monitor for trend and then change meds as required 9. Acute on chronic renal failure: Continue IVF. Strict I/O.  --SCr down from 6.4-->4.27.              --recheck BUN/SCr in am.   11/22- Cr up somewhat to 4.53 from 4.27- had been better last 3 days, however pt says not drinking well- will push fluids and recheck in AM 9. E coli UTI: On Ceftriaxone D# 5/7             --monitor voiding with PVR/Bladder scan 10. Anemia fo chronic  disease: Drop in Hgb due to  hydration             --continue iron supplement.  11. Vitamin D deficiency: Started on ergocalciferol on 11/15 12.  Hypocalcemia: Ca 8.1 w/ alb2.7-->continue supplement as corrects to almost normal.  13. Questionable Schizophrenia/Psych Hx: CIR note from 2010 reviewed.  14. UTI/PNA- on Ancef- finishing IV ABX 15. Incontinent of bowel and bladder- has condom cath and using brief-will monitor skin closely.  16. Low Mg  11/22- Up to 2.1- con't to monitor weekly and replete as required      LOS: 1 days A FACE TO FACE EVALUATION WAS PERFORMED  Divine Imber 10/28/2022, 6:57 PM

## 2022-10-28 NOTE — Progress Notes (Signed)
Inpatient Rehabilitation Care Coordinator Assessment and Plan Patient Details  Name: Daniel [name] MRN: [id number] Date of Birth: 1946-02-26  Today's Date: 10/28/2022  Hospital Problems: Principal Problem:   Acute ischemic left middle cerebral artery (MCA) stroke [facility])  Past Medical History:  Past Medical History:  Diagnosis Date   Hypertension    Psychosis ([place])    Schizophrenia ([place]) 1974   Stroke ([place]) 2010   Right Hemiparesis   Past Surgical History:  Past Surgical History:  Procedure Laterality Date   FEMUR IM NAIL Right 12/12/2019   Procedure: INTRAMEDULLARY (IM) NAIL FEMORAL;  Surgeon: Renette Butters, MD;  Location: [facility];  Service: Orthopedics;  Laterality: Right;   Social History:  reports that he has quit smoking. His smoking use included cigarettes. He smoked an average of .1 packs per day. He has never used smokeless tobacco. He reports that he does not currently use alcohol. He reports that he does not currently use drugs.  Family / Support Systems Other Supports: sister and niece Anticipated Caregiver: Lawyer (sister) Ability/Limitations of Caregiver: NO PHYSICAL ASSISTANCE Caregiver Availability: 24/7 Family Dynamics: support from sister and niece  Social History Preferred language: English Religion:  Health Literacy - How often do you need to have someone help you when you read instructions, pamphlets, or other written material from your doctor or pharmacy?: Sometimes Writes: Yes Employment Status: Disabled   Abuse/Neglect Abuse/Neglect Assessment Can Be Completed: Yes Physical Abuse: Denies Verbal Abuse: Denies Sexual Abuse: Denies Exploitation of patient/patient's resources: Denies Self-Neglect: Denies  Patient response to: Social Isolation - How often do you feel lonely or isolated from those around you?: Never  Emotional Status Recent Psychosocial Issues: coping Psychiatric History: n/a Substance Abuse History: n/a  Patient / Family  Perceptions, Expectations & Goals Pt/Family understanding of illness & functional limitations: yes Premorbid pt/family roles/activities: Previously patient at [facility] level, sister cooking meals and hired helped for household tasks Anticipated changes in roles/activities/participation: Sister unable to provide physical assistance. Able to continue managing meals, cleaning and cogntive tasks Pt/family expectations/goals: [place] Agencies: None Premorbid Home Care/DME Agencies: Other (Comment) (wheelchair and rollign walker) Transportation available at discharge: niece able to transport if needed Is the patient able to respond to transportation needs?: Yes In the past 12 months, has lack of transportation kept you from medical appointments or from getting medications?: No In the past 12 months, has lack of transportation kept you from meetings, work, or from getting things needed for daily living?: No Resource referrals recommended: Neuropsychology (Psychosis, schizophrenia)  Discharge Planning Living Arrangements: Other relatives Support Systems: Other relatives Type of Residence: Private residence (1 level home, ramp entrance) Insurance Resources: Multimedia programmer (specify) Financial Resources: SSD Financial Screen Referred: No Living Expenses: Lives with family Money Management: Patient, Family Does the patient have any problems obtaining your medications?: No Home Management: Independent Patient/Family Preliminary Plans: sister or niece able to assist with cogntive tasks Care Coordinator Barriers to Discharge: Decreased caregiver support, Lack of/limited family support, Insurance for SNF coverage Care Coordinator Anticipated Follow Up Needs: HH/OP Expected length of stay: 12-14 Days  Clinical Impression SW met with patient and called sister, Daniel [name] at bedside. SW introduced self, explained role and provided contact information. Per therapy request SW  invited patient sister to attend family education. Sister reports that is is retired but will be unable to attend but will send their niece to attend on Friday. Sister reports that previously the patient did not require physical assistance. Sister is  able to cook and they have hired assistance for cleaning. Sister unable to provide physical assistance but anticipating assistance from other family but unable to confirm at the moment. Niece stays with them intermittently or when needed. Niece will attend family education on Friday 1-4 PM. Please call sister if further elaboration is needed beyond niece. No additional questions or concerns.   [name] 10/28/2022, 3:51 PM

## 2022-10-28 NOTE — Progress Notes (Addendum)
Patient ID: Daniel [name], male   DOB: 07-Jul-1946, 76 y.o.   MRN: [id number]  Team Conference Report to Patient/Family  Team Conference discussion was reviewed with the patient and caregiver, including goals, any changes in plan of care and target discharge date.  Patient and caregiver express understanding and are in agreement.  The patient has a target discharge date of  (evals pending).  SW met with patient and called sister, [name] at bedside. SW introduced self, explained role and provided contact information. Per therapy request SW invited patient sister to attend family education. Sister reports that is is retired but will be unable to attend but will send their niece to attend on Friday. Sister reports that previously the patient did not require physical assistance. Sister is able to cook and they have hired assistance for cleaning. Sister unable to provide physical assistance but anticipating assistance from other family but unable to confirm at the moment. Niece stays with them intermittently or when needed. Niece will attend family education on Friday 1-4 PM. Please call sister if further elaboration is needed beyond niece. No additional questions or concerns.    [name] 10/28/2022, 1:20 PM

## 2022-10-28 NOTE — Evaluation (Signed)
Speech Language Pathology Assessment and Plan  Patient Details  Name: Daniel [name] MRN: [id number] Date of Birth: [birth year]  SLP Diagnosis: Dysarthria;Speech and Language deficits;Cognitive Impairments;Dysphagia  Rehab Potential: Good ELOS: 2-3 weeks   Today's Date: 10/28/2022 SLP Individual Time: 0900-1000 SLP Individual Time Calculation (min): 62 min  Hospital Problem: Principal Problem:   Acute ischemic left middle cerebral artery (MCA) stroke ([place])  Past Medical History:  Past Medical History:  Diagnosis Date   Hypertension    Psychosis ([place])    Schizophrenia ([place]) 1974   Stroke ([place]) 2010   Right Hemiparesis   Past Surgical History:  Past Surgical History:  Procedure Laterality Date   FEMUR IM NAIL Right 12/12/2019   Procedure: INTRAMEDULLARY (IM) NAIL FEMORAL;  Surgeon: Renette Butters, MD;  Location: [facility];  Service: Orthopedics;  Laterality: Right;    Assessment / Plan / Recommendation Clinical Impression Daniel [name] is a 76 year old  L handed male with history of HTN, hyperlipidemia, stroke w/residual R-HP, CKD-baseline SCr-2.0-2.3 range, question hx of schizophrenia, BPH who was admitted on 10/20/22 with right facial droop and slurred speech. He was found to have acute on chronic renal failure with BUN 41, SCr 5.8, bicarb 19 with mild metabolic acidosis as well as hypokalemia with K-2.9. patient reported multiple falls and worsening of weakness 2 days PTA. MRI brain done revealing acute ischemia in L-MCA territory and R>L SDH, generalized volume loss and encephalomalacia at prior L-ACA infarct. MRA showed occlusion of left ICA proximal to skull base with diminished flow enhancement in L-MCA. Carotid dopplers showed total occlusion of L-ICA, and bilateral ECA with > 50% stenosis.   Dr. Curly [place] felt that stroke was due to carotid stenosis which had progressed to total occlusion.  Pt presents with significant cognitive-linguistic deficits, moderate dysarthria,  suspected anomia, and mild-to-mod oral dysphagia. Pt appears to be a poor historian. He endorsed a history of a prior CVA however unable to recall what year this occurred nor details surrounding his recovery, PLOF vs. current level of function, etc. The [facility] Mental Status Examination was completed to evaluate the pt's cognitive-linguistic skills. Pt achieved a score of 9/30 which is below the normal limits of 27 or more out of 30 and suggestive of at least moderate impairment in the areas of problem solving, short-term memory, awareness, and executive functions. Oral mechanism exam was remarkable for right facial asymmetry and reduced labial and lingual ROM and strength. Pt exhibited difficulty executing oral motor movements during OME. Suspect possible oral apraxia. Speech intelligibility was perceived as ~50% intelligible at the sentence level and c/b articulatory imprecision. Pt endorsed speech changes however stated "it's changed some but not a lot." Question insight into deficits. During formal language assessment, pt achieved 9/10 for responding to yes/no questions, 8/10 for verbal repetition, 8/10 for object naming with word finding difficulty noted x2 and semantic paraphasia x1. Pt exhibited significant difficulty with divergent naming task. It does not appear pt was formally assessed for swallowing while in acute care. Today, pt exhibited significantly prolonged mastication (5 minutes per average sized bite), decreased lingual manipulation, slow AP transit, and moderate oral residuals with regular and dys 3 textures; improved significantly with softer consistencies such as dys 2 textures. Decreased tolerance to regular and dys 3 solids likely result of oral impairments and further c/b by sparse dentition. Pt exhibited no overt s/sx of aspiration with solids and thin liquids via straw. Recommend downgrade to dysphagia 2 textures and thin liquids at  this time. Pills whole with water (nurse  reported good tolerance this am). Skilled ST intervention is recommended to address cognitive-linguistic, speech, swallow skills and functional independence. Pt verbalized understanding and agreement with plan.    Skilled Therapeutic Interventions          Cognitive-linguistic, speech/language, swallow evaluations completed. Please see report for full details.   SLP Assessment  Patient will need skilled Speech Lanaguage Pathology Services during CIR admission    Recommendations  SLP Diet Recommendations: Dysphagia 2 (Fine chop);Thin Liquid Administration via: Straw;Cup Medication Administration: Whole meds with liquid Supervision: Intermittent supervision to cue for compensatory strategies Compensations: Minimize environmental distractions;Slow rate;Small sips/bites;Lingual sweep for clearance of pocketing Postural Changes and/or Swallow Maneuvers: Seated upright 90 degrees Oral Care Recommendations: Oral care BID Patient destination: Home Follow up Recommendations: Outpatient SLP;Home Health SLP;24 hour supervision/assistance Equipment Recommended: None recommended by SLP    SLP Frequency 3 to 5 out of 7 days   SLP Duration  SLP Intensity  SLP Treatment/Interventions 2-3 weeks  Minumum of 1-2 x/day, 30 to 90 minutes  Cognitive remediation/compensation;Internal/external aids;Oral motor exercises;Speech/Language facilitation;Therapeutic Exercise;Cueing hierarchy;Dysphagia/aspiration precaution training;Functional tasks;Patient/family education;Therapeutic Activities    Pain Pain Assessment Pain Scale: 0-10 Pain Score: 0-No pain  Prior Functioning Cognitive/Linguistic Baseline: Information not available Type of Home: House  Lives With: Other (Comment) (sister) Available Help at Discharge: Family Education: high school Vocation:  (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 User may not have seen previous data.) Year: 2023 Month: November Day of Week: Correct Attention: Sustained Sustained Attention: Impaired Sustained Attention Impairment: Verbal basic Memory: Impaired Memory Impairment: Storage deficit;Decreased recall of new information;Retrieval deficit Awareness: Impaired Awareness Impairment: Intellectual impairment;Emergent impairment Problem Solving: Impaired Problem Solving Impairment: Verbal basic;Functional basic Executive Function:  (all impaired) Safety/Judgment: Impaired  Comprehension Auditory Comprehension Overall Auditory Comprehension: Appears within functional limits for tasks assessed - suspect may be impacted secondary to fatigue Yes/No Questions: Within Functional Limits Basic Biographical Questions: 76-100% accurate Basic Immediate Environment Questions: 75-100% accurate Complex Questions: 75-100% accurate Commands: Impaired One Step Basic Commands: 75-100% accurate Two Step Basic Commands: 75-100% accurate Interfering Components: Attention EffectiveTechniques: Repetition;Pausing Expression Expression Primary Mode of Expression: Verbal Verbal Expression Overall Verbal Expression: Impaired Initiation: No impairment Automatic Speech: Name;Social Response Level of Generative/Spontaneous Verbalization: Conversation Repetition: Impaired Level of Impairment: Sentence level Naming: Impairment Responsive: 76-100% accurate Confrontation: Impaired (80%) Divergent: 0-24% accurate (1 animal named in one minute. Pt perseverated on "zoo animals") Verbal Errors: Semantic paraphasias Interfering Components: Attention;Speech intelligibility Effective Techniques: Semantic cues;Other (Comment) (field of choices) Oral Motor Oral Motor/Sensory Function Overall Oral Motor/Sensory Function: Moderate  impairment Facial ROM: Reduced right;Suspected CN VII (facial) dysfunction Facial Symmetry: Abnormal symmetry right;Suspected CN VII (facial) dysfunction Facial Strength: Reduced right;Suspected CN VII (facial) dysfunction Lingual ROM: Reduced right;Suspected CN XII (hypoglossal) dysfunction Lingual Symmetry: Abnormal symmetry right;Suspected CN XII (hypoglossal) dysfunction Lingual Strength: Reduced;Suspected CN XII (hypoglossal) dysfunction Velum: Other (comment) (unable to assess) Mandible: Within Functional Limits Motor Speech Overall Motor Speech: Impaired Respiration: Within functional limits Phonation: Normal Resonance: Within functional limits Articulation: Impaired Level of Impairment: Word Intelligibility: Intelligibility reduced Word: 50-74% accurate Phrase: 50-74% accurate Sentence: 50-74% accurate (50%) Conversation: Not tested Motor Speech Errors: Inconsistent Effective Techniques: Slow rate;Increased vocal intensity;Over-articulate  Care Tool Care Tool Cognition Ability to hear (with hearing aid or hearing appliances if normally used  Ability to hear (with hearing aid or hearing appliances if normally used): 1. Minimal difficulty - difficulty in some environments (e.g. when person speaks softly or setting is noisy)   Expression of Ideas and Wants Expression of Ideas and Wants: 2. Frequent difficulty - frequently exhibits difficulty with expressing needs and ideas   Understanding Verbal and Non-Verbal Content Understanding Verbal and Non-Verbal Content: 3. Usually understands - understands most conversations, but misses some part/intent of message. Requires cues at times to understand  Memory/Recall Ability Memory/Recall Ability : Current season;That he or she is in a hospital/hospital unit   Intelligibility: Intelligibility reduced Word: 50-74% accurate Phrase: 50-74% accurate Sentence: 50-74% accurate (50%) Conversation: Not tested  Bedside Swallowing  Assessment General Date of Onset: 10/20/22 Previous Swallow Assessment: passed Yale swallow screen Diet Prior to this Study: Regular;Thin liquids Temperature Spikes Noted: No Respiratory Status: Room air Behavior/Cognition: Alert;Cooperative;Pleasant mood Oral Cavity - Dentition: Missing dentition;Poor condition Self-Feeding Abilities: Able to feed self;Needs set up Patient Positioning: Upright in chair/Tumbleform Baseline Vocal Quality: Normal Volitional Cough: Strong Volitional Swallow: Able to elicit  Oral Care Assessment Oral Assessment  (WDL): Exceptions to WDL Lips: Asymmetrical Teeth: Missing (Comment);Poor dental hygiene;Difficulty chewing Tongue: Pink Mucous Membrane(s): Moist Saliva: Moist, saliva free flowing Level of Consciousness: Alert Is patient on any of following O2 devices?: None of the above Nutritional status: Dysphagia Oral Assessment Risk : High Risk Ice Chips   Thin Liquid Thin Liquid: Within functional limits Presentation: Straw Nectar Thick Nectar Thick Liquid: Not tested Honey Thick Honey Thick Liquid: Not tested Puree Puree: Within functional limits Presentation: Spoon Solid Solid: Impaired Oral Phase Impairments: Reduced lingual movement/coordination Oral Phase Functional Implications: Impaired mastication;Prolonged oral transit;Oral residue BSE Assessment Risk for Aspiration Impact on safety and function: Mild aspiration risk Other Related Risk Factors: Cognitive impairment  Short Term Goals: Week 1: SLP Short Term Goal 1 (Week 1): Pt will consume current diet with improved mastication, oral manipulation, minimal oral residuals, and minimal overt s/sx of aspiration with min A cues for implementation of swallowing precautions/strategies SLP Short Term Goal 2 (Week 1): Patient will implement speech intelligibility strategies at the word and short sentence level with 75% intelligiblity with mod A for implementation of speech intelligibility  strategies SLP Short Term Goal 3 (Week 1): Patient will recall and demonstrate use of memory compensations to increase independence and safety at home with mod A verbal cues. SLP Short Term Goal 4 (Week 1): Patient will demonstrate functional problem solving for basic and familiar tasks with Mod A verbal cues. SLP Short Term Goal 5 (Week 1): Pt will recall and demonstrate use of word finding strategies within structured and/or natural contexts with minA verbal cues.  Refer to Care Plan for Long Term Goals  Recommendations for other services: None   Discharge Criteria: Patient will be discharged from SLP if patient refuses treatment 3 consecutive times without medical reason, if treatment goals not met, if there is a change in medical status, if patient makes no progress towards goals or if patient is discharged from hospital.  The above assessment, treatment plan, treatment alternatives and goals were discussed and mutually agreed upon: by patient  Patty Sermons 10/28/2022, 4:01 PM

## 2022-10-29 LAB — BASIC METABOLIC PANEL
Anion gap: 6 (ref 5–15)
BUN: 24 mg/dL — ABNORMAL HIGH (ref 8–23)
CO2: 21 mmol/L — ABNORMAL LOW (ref 22–32)
Calcium: 7.7 mg/dL — ABNORMAL LOW (ref 8.9–10.3)
Chloride: 113 mmol/L — ABNORMAL HIGH (ref 98–111)
Creatinine, Ser: 4.3 mg/dL — ABNORMAL HIGH (ref 0.61–1.24)
GFR, Estimated: 14 mL/min — ABNORMAL LOW (ref >60)
Glucose, Bld: 94 mg/dL (ref 70–99)
Potassium: 4.4 mmol/L (ref 3.5–5.1)
Sodium: 140 mmol/L (ref 135–145)

## 2022-10-29 MED ORDER — HYDRALAZINE HCL 50 MG PO TABS
50.0000 mg | ORAL_TABLET | Freq: Two times a day (BID) | ORAL | Status: DC
  Administered 2022-10-29 – 2022-10-31 (×4): 50 mg via ORAL
  Filled 2022-10-29 (×4): qty 1

## 2022-10-29 NOTE — Progress Notes (Signed)
PROGRESS NOTE   Subjective/Complaints:  No acute complaints this AM. No events overnight. Discussed with patient increasing PO fluids over next 1-2 days to avoid intermittent IVF, will recheck BMP in 1 days. Patient agreeable with plan.    ROS:  Pt denies SOB, abd pain, CP, N/V/C/D, and vision changes   Objective:   No results found. Recent Labs    10/28/22 0549  WBC 6.0  HGB 8.6*  HCT 26.2*  PLT 310    Recent Labs    10/28/22 0549 10/29/22 0452  NA 140 140  K 4.6 4.4  CL 110 113*  CO2 21* 21*  GLUCOSE 94 94  BUN 28* 24*  CREATININE 4.53* 4.30*  CALCIUM 8.1* 7.7*     Intake/Output Summary (Last 24 hours) at 10/29/2022 0845 Last data filed at 10/29/2022 3704 Gross per 24 hour  Intake 2136.25 ml  Output 1325 ml  Net 811.25 ml         Physical Exam: Vital Signs Blood pressure (!) 170/79, pulse 81, temperature 99.3 F (37.4 C), temperature source Oral, resp. rate 20, height 5\' 6"  (1.676 m), weight 68.7 kg, SpO2 97 %.   General: awake, alert, appropriate, sitting up in bedside chair; wrapped in blanket; NAD HENT: conjugate gaze; oropharynx moist; poor dentition CV: regular rate; no JVD Pulmonary: CTA B/L; no W/R/R- good air movement GI: soft, NT, ND, (+)BS- normoactive Psychiatric: appropriate Neurological: very dysarthric- mildly aphasic; alert Musculoskeletal:     Cervical back: Neck supple. No tenderness.     Moving all 4 extremities in bed     Skin:    General: Skin is warm and dry. +RUE IV Neurological:     Mental Status: He is alert.     Comments: Mild dysarthria, as well as some aphasia- sentence level    PE from prior encounter:  Comments: RUE- proximal 3/5; distal 2/5 LUE 5-/5 in same muscles RLE- HF 3/5; DF/PF 2/5- pt couldn't understand to test KE/KF LLE- 5-/5 in same muscles- KE/KF NT   Hoffman's RUE 2-3 beats clonus RLE Face intact to light touch, but decreased entire R  side  Assessment/Plan: 1. Functional deficits which require 3+ hours per day of interdisciplinary therapy in a comprehensive inpatient rehab setting. Physiatrist is providing close team supervision and 24 hour management of active medical problems listed below. Physiatrist and rehab team continue to assess barriers to discharge/monitor patient progress toward functional and medical goals  Care Tool:  Bathing    Body parts bathed by patient: Right arm, Chest, Right upper leg, Left upper leg   Body parts bathed by helper: Left arm, Front perineal area, Buttocks, Right lower leg, Left lower leg, Face, Abdomen     Bathing assist Assist Level: Total Assistance - Patient < 25%     Upper Body Dressing/Undressing Upper body dressing   What is the patient wearing?: Hospital gown only    Upper body assist Assist Level: Total Assistance - Patient < 25%    Lower Body Dressing/Undressing Lower body dressing      What is the patient wearing?: Hospital gown only     Lower body assist Assist for lower body dressing: Total Assistance -  Patient < 25%     Toileting Toileting    Toileting assist Assist for toileting: Dependent - Patient 0%     Transfers Chair/bed transfer  Transfers assist     Chair/bed transfer assist level: Moderate Assistance - Patient 50 - 74%     Locomotion Ambulation   Ambulation assist   Ambulation activity did not occur: Safety/medical concerns (pt has not ambulated in 3 years, uses w/c for mobility baseline)          Walk 10 feet activity   Assist  Walk 10 feet activity did not occur: Safety/medical concerns        Walk 50 feet activity   Assist Walk 50 feet with 2 turns activity did not occur: Safety/medical concerns         Walk 150 feet activity   Assist Walk 150 feet activity did not occur: Safety/medical concerns         Walk 10 feet on uneven surface  activity   Assist Walk 10 feet on uneven surfaces activity did  not occur: Safety/medical concerns         Wheelchair     Assist Is the patient using a wheelchair?: Yes Type of Wheelchair: Manual    Wheelchair assist level: Maximal Assistance - Patient 25 - 49% Max wheelchair distance: 165ft    Wheelchair 50 feet with 2 turns activity    Assist        Assist Level: Maximal Assistance - Patient 25 - 49%   Wheelchair 150 feet activity     Assist      Assist Level: Maximal Assistance - Patient 25 - 49%   Blood pressure (!) 170/79, pulse 81, temperature 99.3 F (37.4 C), temperature source Oral, resp. rate 20, height 5\' 6"  (1.676 m), weight 68.7 kg, SpO2 97 %.  Medical Problem List and Plan: 1. Functional deficits secondary to L MCA stroke with R sided weakness in setting of prior R hemiparesis             -patient may  shower             -ELOS/Goals: 12- 14 days supervision  First days of evaluations-con't CIR-PT, OT and SLP  2.  Antithrombotics: -DVT/anticoagulation:  Pharmaceutical: Lovenox             -antiplatelet therapy: ASA 3. Pain Management: Tylenol prn.  4. Mood/Behavior/Sleep: LCSW to follow for evaluation and support.              --trazodone prn for insomnia.              -antipsychotic agents: N/A 5. Neuropsych/cognition: This patient is? capable of making decisions on his own behalf. 6. Skin/Wound Care: Routine pressure relief measures. And skin /wound care due to incontinence 7. Fluids/Electrolytes/Nutrition: Monitor I/O. Check CMET in am.             --continue IVF and bicarb for now. Check Mg level in am. 8. HTN: Monitor BP TID--continues to be labile. On hydralazine and amlodipine             --off ace due to acute on chronic renal failure  11/22- BP a little elevated- will monitor for trend and then change meds as required    10/29/2022    5:18 AM 10/28/2022    8:18 PM 10/28/2022    3:00 PM  Vitals with BMI  Systolic 409 811 914  Diastolic 79 80 75  Pulse 81 88 82  11/23:  Consistently 150-170 SBP; increase Hydralazine to 50 mg BID  9. Acute on chronic renal failure: Continue IVF. Strict I/O.  --SCr down from 6.4-->4.27-> 4.5->4.3   11/22- Cr up somewhat to 4.53 from 4.27- had been better last 3 days, however pt says not drinking well- will push fluids and recheck in AM              11/23: 1,500 IVF yesterday, PO 480 cc PO yesterday, Cr improved to 4.3 today. Continue on PO and repeat in AM.   9. E coli UTI: On Ceftriaxone D# 5/7             --monitor voiding with PVR/Bladder scan 10. Anemia fo chronic disease: Drop in Hgb due to hydration             --continue iron supplement.  11. Vitamin D deficiency: Started on ergocalciferol on 11/15 12.  Hypocalcemia: Ca 8.1 w/ alb2.7-->continue supplement as corrects to almost normal.               -- Ca stable 8.1-7.7 11/23  13. Questionable Schizophrenia/Psych Hx: CIR note from 2010 reviewed.  14. UTI/PNA- on Ancef- finishing IV ABX 15. Incontinent of bowel and bladder- has condom cath and using brief-will monitor skin closely.              - LBM 11/22  16. Low Mg  11/22- Up to 2.1- con't to monitor weekly and replete as required      LOS: 2 days A FACE TO [place] 10/29/2022, 8:45 AM

## 2022-10-30 LAB — BASIC METABOLIC PANEL
Anion gap: 10 (ref 5–15)
BUN: 21 mg/dL (ref 8–23)
CO2: 20 mmol/L — ABNORMAL LOW (ref 22–32)
Calcium: 7.7 mg/dL — ABNORMAL LOW (ref 8.9–10.3)
Chloride: 111 mmol/L (ref 98–111)
Creatinine, Ser: 4.26 mg/dL — ABNORMAL HIGH (ref 0.61–1.24)
GFR, Estimated: 14 mL/min — ABNORMAL LOW (ref >60)
Glucose, Bld: 94 mg/dL (ref 70–99)
Potassium: 4.2 mmol/L (ref 3.5–5.1)
Sodium: 141 mmol/L (ref 135–145)

## 2022-10-30 NOTE — Progress Notes (Signed)
PROGRESS NOTE   Subjective/Complaints:  No acute complaints this AM. No events overnight.  Patient documented PO intake only 236 ccs yesterday; agreeable to work at increasing POs. ]  BUN, Cr has stabilized on maintenance fluids 75/hr.   ROS:  Pt denies SOB, abd pain, CP, N/V/C/D, and vision changes   Objective:   No results found. Recent Labs    10/28/22 0549  WBC 6.0  HGB 8.6*  HCT 26.2*  PLT 310    Recent Labs    10/29/22 0452 10/30/22 0552  NA 140 141  K 4.4 4.2  CL 113* 111  CO2 21* 20*  GLUCOSE 94 94  BUN 24* 21  CREATININE 4.30* 4.26*  CALCIUM 7.7* 7.7*     Intake/Output Summary (Last 24 hours) at 10/30/2022 0841 Last data filed at 10/30/2022 9794 Gross per 24 hour  Intake 236 ml  Output 1400 ml  Net -1164 ml         Physical Exam: Vital Signs Blood pressure (!) 168/86, pulse 94, temperature 98.3 F (36.8 C), temperature source Oral, resp. rate 18, height 5\' 6"  (1.676 m), weight 69 kg, SpO2 99 %.   General: awake, alert, appropriate, sitting up in bedside chair; wrapped in blanket; NAD HENT: conjugate gaze; oropharynx moist; poor dentition CV: regular rate; no JVD Pulmonary: CTA B/L; no W/R/R- good air movement GI: soft, NT, ND, (+)BS- normoactive Psychiatric: appropriate MSK: Moving all 4 extremities in bed Skin: Skin is warm and dry. +RUE IV, running IVF Neuro: + Mild dysarthria, as well as some espressive aphasia- sentence level. AAOx3. Follows all commands.     PE from prior encounter:  RUE- proximal 3/5; distal 2/5 LUE 5-/5 in same muscles RLE- HF 3/5; DF/PF 2/5- pt couldn't understand to test KE/KF LLE- 5-/5 in same muscles- KE/KF NT   Hoffman's RUE 2-3 beats clonus RLE Face intact to light touch, but decreased entire R side  Assessment/Plan: 1. Functional deficits which require 3+ hours per day of interdisciplinary therapy in a comprehensive inpatient rehab  setting. Physiatrist is providing close team supervision and 24 hour management of active medical problems listed below. Physiatrist and rehab team continue to assess barriers to discharge/monitor patient progress toward functional and medical goals  Care Tool:  Bathing    Body parts bathed by patient: Right arm, Chest, Right upper leg, Left upper leg   Body parts bathed by helper: Left arm, Front perineal area, Buttocks, Right lower leg, Left lower leg, Face, Abdomen     Bathing assist Assist Level: Total Assistance - Patient < 25%     Upper Body Dressing/Undressing Upper body dressing   What is the patient wearing?: Hospital gown only    Upper body assist Assist Level: Total Assistance - Patient < 25%    Lower Body Dressing/Undressing Lower body dressing      What is the patient wearing?: Hospital gown only     Lower body assist Assist for lower body dressing: Total Assistance - Patient < 25%     Toileting Toileting    Toileting assist Assist for toileting: Dependent - Patient 0%     Transfers Chair/bed transfer  Transfers assist  Chair/bed transfer  activity did not occur: Safety/medical concerns  Chair/bed transfer assist level: Moderate Assistance - Patient 50 - 74%     Locomotion Ambulation   Ambulation assist   Ambulation activity did not occur: Safety/medical concerns (pt has not ambulated in 3 years, uses w/c for mobility baseline)          Walk 10 feet activity   Assist  Walk 10 feet activity did not occur: Safety/medical concerns        Walk 50 feet activity   Assist Walk 50 feet with 2 turns activity did not occur: Safety/medical concerns         Walk 150 feet activity   Assist Walk 150 feet activity did not occur: Safety/medical concerns         Walk 10 feet on uneven surface  activity   Assist Walk 10 feet on uneven surfaces activity did not occur: Safety/medical concerns         Wheelchair     Assist Is  the patient using a wheelchair?: Yes Type of Wheelchair: Manual    Wheelchair assist level: Maximal Assistance - Patient 25 - 49% Max wheelchair distance: 1100ft    Wheelchair 50 feet with 2 turns activity    Assist        Assist Level: Maximal Assistance - Patient 25 - 49%   Wheelchair 150 feet activity     Assist      Assist Level: Maximal Assistance - Patient 25 - 49%   Blood pressure (!) 168/86, pulse 94, temperature 98.3 F (36.8 C), temperature source Oral, resp. rate 18, height 5\' 6"  (1.676 m), weight 69 kg, SpO2 99 %.  Medical Problem List and Plan: 1. Functional deficits secondary to L MCA stroke with R sided weakness in setting of prior R hemiparesis             -patient may  shower             -ELOS/Goals: 12- 14 days supervision  -con't CIR-PT, OT and SLP   2.  Antithrombotics: -DVT/anticoagulation:  Pharmaceutical: Lovenox             -antiplatelet therapy: ASA 3. Pain Management: Tylenol prn.  4. Mood/Behavior/Sleep: LCSW to follow for evaluation and support.              --trazodone prn for insomnia.              -antipsychotic agents: N/A  5. Neuropsych/cognition: This patient is capable of making decisions on his own behalf. 6. Skin/Wound Care: Routine pressure relief measures. And skin /wound care due to incontinence 7. Fluids/Electrolytes/Nutrition: Monitor I/O. Check CMET in am.             --continue IVF and bicarb for now - remains on maintenance 75 cc/hr - DC maintenance, encourage PO 11/23; goal to stabilize to remove PO bicarb and re-initiate ACE   8. HTN: Monitor BP TID--continues to be labile. On hydralazine and amlodipine             --off ace due to acute on chronic renal failure  11/22- BP a little elevated- will monitor for trend and then change meds as required    10/30/2022    4:53 AM 10/30/2022    4:49 AM 10/29/2022    7:30 PM  Vitals with BMI  Weight 152 lbs 2 oz    BMI 63.78    Systolic  588 502  Diastolic  86 85   Pulse  94 107  11/23: Consistently 150-170 SBP; increase Hydralazine to 50 mg BID              11/24: HTN remains uncontrolled. Cr and BUN has remained stable last several checks; DC IVF, encourage PO, if stable over 1-2 days will re-initiate ACE  9. Acute on chronic renal failure (baseline 2.0-2.3 2021): Continue IVF. Strict I/O.  --SCr down from 6.4-->4.27-> 4.5->4.3   11/22- Cr up somewhat to 4.53 from 4.27- had been better last 3 days, however pt says not drinking well- will push fluids and recheck in AM              11/23: 1,500 IVF yesterday, PO 480 cc PO yesterday, Cr 4.3 today. Continue on PO and repeat in AM.               11/24: PO remains poor, patient remains on 75 cc/hr maintenance IVF, Cr and BUN stable. DC IVF today and recheck next 3 days.   9. E coli UTI: On Ceftriaxone D# 5/7             --monitor voiding with PVR/Bladder scan 10. Anemia fo chronic disease: Drop in Hgb due to hydration             --continue iron supplement.  11. Vitamin D deficiency: Started on ergocalciferol on 11/15 12.  Hypocalcemia: Ca 8.1 w/ alb2.7-->continue supplement as corrects to almost normal.               -- Ca stable 8.1-7.7 11/23  13. Questionable Schizophrenia/Psych Hx: CIR note from 2010 reviewed.  14. UTI/PNA- on Ancef- finishing IV ABX 15. Incontinent of bowel and bladder- has condom cath and using brief-will monitor skin closely.              - LBM 11/22  16. Low Mg  11/22- Up to 2.1- con't to monitor weekly and replete as required      LOS: 3 days A FACE TO [place] 10/30/2022, 8:41 AM

## 2022-10-30 NOTE — Progress Notes (Signed)
Patient ID: Daniel [name], male   DOB: 01-Feb-1946, 76 y.o.   MRN: [id number]  Therapy requesting sneakers for patient therapy session. SW made attempt to call patient sister, [name]. Left detailed VM. Sw will wait for follow up.

## 2022-10-30 NOTE — Progress Notes (Signed)
Physical Therapy Session Note  Patient Details  Name: Daniel [name] MRN: [id number] Date of Birth: May 22, 1946  Today's Date: 10/30/2022 PT Individual Time: 1025-1109 and 8242-3536 PT Individual Time Calculation (min): 44 min and 30 min  Short Term Goals: Week 1:  PT Short Term Goal 1 (Week 1): Pt will perform supine<>sit with min assist using bed features as needed PT Short Term Goal 2 (Week 1): Pt will perform sit<>stand transfers using LRAD with min assist PT Short Term Goal 3 (Week 1): Pt will perform bed<>wheelchair transfers using LRAD with min assist consistently PT Short Term Goal 4 (Week 1): Pt will perform at least 29ft wheelchair mobility with no more than min assist  Skilled Therapeutic Interventions/Progress Updates:    Session 1: Pt received sitting in w/c as hand-off from OT. Pt agreeable to continue with PT session therefore therapist assumed care of patient.   Pt able to unlock the w/c with verbal cuing - therapist applied grip to R brake to increase pt's independence with this (pt uses L hand to unlock R brake).  Performed L hemi-technique w/c propulsion ~196ft to main therapy gym with min assist on straight path and mod assist for turning - pt primarily using L LE due to difficulty with coordinating L UE and L LE push strokes while avoiding constant R veering.   Requested SW reach out to pt's sister to bring pt tennis shoes.  Therapist reached out to [facility], [facility] with [employer] to arrange custom manual wheelchair consult for next week.  Therapist reinforcing prior education to pt on setting up w/c for squat/partial stand pivot transfers - pt able to lock brakes with hand-over hand assist for R UE to lock R brake - pt states his brakes at home are pull to lock.  L partial stand pivot transfer w/c>EOM with therapist facilitating R foot placement pt able to come to partial stand and then pivot over to mat with therapist guarding R knee but no buckling noted and min assist  for balance while turning.  Sit<>stands to/from EOM x3 using RW with mod assist for lifting and lighter mod for lowering - therapist facilitates R LE positioning to avoid excessive flexion and adduction positioning - pt initially places both hands on up RW and then cuing to push up with L hand - therapist manually facilitating R hand grasp on RW (unsure of RW hand orthosis would accommodate pt's positioning). Stands for ~20seconds each time with cuing and mirror feedback for upright posture - heavy min assist for balance and pt noted to have R pelvic weight shift and slight R tipping over of RW, cuing to improve midline - continues to have forward flexed posture with excessive trunk/hip/knee flexion.  R partial stand pivot transfer EOM>w/c requiring heavier min assist to transfer in this direction, therapist guarding R LE for improved positioning and pt able to take 2x partial R steps to complete transfer.  Transported back to his room and therapist utilizing towel rolls, pillows, and R 1/2 lap tray to improve pt's positioning in w/c - left with needs in reach and seat belt alarm on.   Session 2: Pt received sitting in w/c finishing eating lunch and agreeable to therapy session with request to return to bed.  Therapist reinforced education on proper positioning of w/c next to EOB for transfers and pt able to lock brakes with assist for R side.   L partial stand pivot w/c>EOB with min assist for lifting and pivoting hips while maintaining balance -  therapist continues to guard R LE and facilitate improved alignment due to significant flexed/adducted positioning, but no buckling occurs - pt relies on L UE support on bed to assist with balance.  Sit>L sidelying with mod assist for B LE management onto bed. L sidelying>supine without assist. Therapist performing R LE stretching and PROM into hip/knee extension (pt lacks full ROM) and hip internal and external rotation.   At end of session, pt left  supine in bed with needs in reach, R hemibody positioned with pillows, and bed alarm on.  Therapy Documentation Precautions:  Precautions Precautions: Fall, Other (comment) Precaution Comments: acute on chronic R hemiparesis with R UE and LE posturing/contractures Restrictions Weight Bearing Restrictions: No   Pain:  Session 1: No reports of pain throughout session.  Session 2: Reports some discomfort with R LE stretching but only performed within pt's tolerance.    Therapy/Group: Individual Therapy  Tawana Scale , PT, DPT, NCS, CSRS 10/30/2022, 7:54 AM

## 2022-10-30 NOTE — IPOC Note (Signed)
Overall Plan of Care [facility]) Patient Details Name: Daniel [name] MRN: [id number] DOB: [birth year]  Admitting Diagnosis: Acute ischemic left middle cerebral artery (MCA) stroke [facility])  Hospital Problems: Principal Problem:   Acute ischemic left middle cerebral artery (MCA) stroke ([place])     Functional Problem List: Nursing Bladder, Bowel, Endurance, Medication Management, Pain, Safety  PT Balance, Perception, Behavior, Safety, Edema, Sensory, Endurance, Skin Integrity, Motor, Nutrition, Pain  OT Balance, Perception, Safety, Cognition, Sensory, Endurance, Vision, Motor  SLP Cognition, Linguistic, Motor, Nutrition  TR         Basic ADL's: OT Grooming, Bathing, Dressing, Toileting     Advanced  ADL's: OT Light Housekeeping     Transfers: PT Bed Mobility, Bed to Chair, Musician, Manufacturing systems engineer, Metallurgist: PT Ambulation, Emergency planning/management officer     Additional Impairments: OT Fuctional Use of Upper Extremity  SLP Swallowing, Communication, Social Cognition expression Problem Solving, Memory, Attention, Awareness  TR      Anticipated Outcomes Item Anticipated Outcome  Self Feeding Supervision  Swallowing  sup A   Basic self-care  min A  Toileting  min A   Bathroom Transfers CGA  Bowel/Bladder  manage bowel w mod I and bladder w toileting  Transfers  CGA using LRAD  Locomotion  do not anticipate pt will be a functional ambulator (did not ambulate at baseline)  Communication  sup A  Cognition  min A  Pain  < 4 with prns  Safety/Judgment  manage w cues   Therapy Plan: PT Intensity: Minimum of 1-2 x/day ,45 to 90 minutes PT Frequency: 5 out of 7 days PT Duration Estimated Length of Stay: 2-3 weeks OT Intensity: Minimum of 1-2 x/day, 45 to 90 minutes OT Frequency: 5 out of 7 days OT Duration/Estimated Length of Stay: 2-3 weeks SLP Intensity: Minumum of 1-2 x/day, 30 to 90 minutes SLP Frequency: 3 to 5 out of 7 days SLP Duration/Estimated Length  of Stay: 2-3 weeks   Team Interventions: Nursing Interventions Bladder Management, Patient/Family Education, Pain Management, Medication Management, Bowel Management, Discharge Planning, Disease Management/Prevention  PT interventions Neuromuscular re-education, Ambulation/gait training, Community reintegration, DME/adaptive equipment instruction, Psychosocial support, UE/LE Strength taining/ROM, Wheelchair propulsion/positioning, Training and development officer, Discharge planning, Functional electrical stimulation, Pain management, Skin care/wound management, Therapeutic Activities, UE/LE Coordination activities, Cognitive remediation/compensation, Disease management/prevention, Functional mobility training, Patient/family education, Splinting/orthotics, Therapeutic Exercise, Visual/perceptual remediation/compensation  OT Interventions Balance/vestibular training, Discharge planning, Functional electrical stimulation, Pain management, Self Care/advanced ADL retraining, Therapeutic Activities, UE/LE Coordination activities, Cognitive remediation/compensation, Functional mobility training, Patient/family education, Therapeutic Exercise, Skin care/wound managment, DME/adaptive equipment instruction, Neuromuscular re-education, Psychosocial support, Splinting/orthotics, UE/LE Strength taining/ROM, Wheelchair propulsion/positioning, Visual/perceptual remediation/compensation, Community reintegration  SLP Interventions Cognitive remediation/compensation, Internal/external aids, Oral motor exercises, Speech/Language facilitation, Therapeutic Exercise, Cueing hierarchy, Dysphagia/aspiration precaution training, Functional tasks, Patient/family education, Therapeutic Activities  TR Interventions    SW/CM Interventions Discharge Planning, Psychosocial Support, Patient/Family Education, Disease Management/Prevention   Barriers to Discharge MD  Medical stability, Home enviroment access/loayout, Incontinence, Lack  of/limited family support, Medication compliance, and Nutritional means  Nursing Lack of/limited family support 1 level ramped entry w sister  PT Decreased caregiver support, Neurogenic Bowel & Bladder, Incontinence, Lack of/limited family support    OT      SLP Nutrition means dysphagia  SW Decreased caregiver support, Lack of/limited family support, Insurance underwriter for SNF coverage     Team Discharge Planning: Destination: PT-Home ,OT- Home , SLP-Home Projected Follow-up: PT-Home health PT, 24 hour supervision/assistance, OT-  Home health OT, SLP-Outpatient SLP, Home Health SLP, 24 hour supervision/assistance Projected Equipment Needs: PT-Other (comment), OT- To be determined, SLP-None recommended by SLP Equipment Details: PT-custom manual wheelchair evaluation, OT-  Patient/family involved in discharge planning: PT- Patient,  OT-Patient, SLP-Patient  MD ELOS: 14-18 days Medical Rehab Prognosis:  Good Assessment: The patient has been admitted for CIR therapies with the diagnosis of L MCA stroke. The team will be addressing functional mobility, strength, stamina, balance, safety, adaptive techniques and equipment, self-care, bowel and bladder mgt, patient and caregiver education, PO nutrition/hydration. Goals have been set at [facility]. Anticipated discharge destination is home.   See Team Conference Notes for weekly updates to the plan of care     Gertie Gowda, DO 10/30/2022

## 2022-10-30 NOTE — Progress Notes (Signed)
Occupational Therapy Session Note  Patient Details  Name: Daniel [name] MRN: [id number] Date of Birth: June 15, 1946  Today's Date: 10/30/2022 OT Individual Time: 3435-6861 OT Individual Time Calculation (min): 74 min    Short Term Goals: Week 1:  OT Short Term Goal 1 (Week 1): Pt will maintian sitting balance min A for 3 minutes OT Short Term Goal 2 (Week 1): Pt will perform LB dressing mod A OT Short Term Goal 3 (Week 1): Pt will perform grooming mod A OT Short Term Goal 4 (Week 1): Further assess vision in functional context  Skilled Therapeutic Interventions/Progress Updates:     Pt received resting in bed in good spirits and motivated to participate in skilled OT session. Pt reporting on pain during session. Pt covered in bowel movement upon OT arrival following incontinent bowel movement in bed seeping out of Pt brief. Pt rolled to R min A with mod verbal cueing. Pt supine to sitting mod A with increased time. Pt cleaned up and brief removed with Pt sitting EOB leaning R/L with mod A and max verbal cues to assist with pericare. Pt receptive to taking shower to complete clean up. Pt squat pivot to wc mod A to L side d/t Pt significantly pushing to R side. Pt transported total A to bathroom in wc. Pt squat pivot to tub bench mod A going to R side. During shower, Pt provided maximal verbal cues for posture and body mechanics to address dynamic sitting balance deficits and decreased trunk control. Wb'ing through R UE facilitated during task with Pt requiring close sueprvision to maintin sitting blance with BUE supported and mod A without LUE supported. Pt completed UB bathing mod A and LB bathing max A with Pt leaning R/L to wash bottom. Pt transferred back to wc squat pivot max A. Pt completed UB dressing using hemi-dressing technique with mod A and LB dressing max A. Pt was left resting in wc and handed off to PT with all needs met.    Therapy Documentation Precautions:   Precautions Precautions: Fall, Other (comment) Precaution Comments: acute on chronic R hemiparesis with R UE and LE posturing/contractures Restrictions Weight Bearing Restrictions: No General:    Pain: Pain Assessment Pain Scale: 0-10 Pain Score: 0-No pain   Therapy/Group: Individual Therapy  Janey Genta 10/30/2022, 12:53 PM

## 2022-10-30 NOTE — Progress Notes (Signed)
Patient ID: Daniel [name], male   DOB: June 28, 1946, 76 y.o.   MRN: [id number]  SW received a call from patient sister , [place]. [name] made SW aware that niece will not be able to attend education today. [name] will call SW back today to reschedule family education. Family reports that they will bring in sneakers for the patient tomorrow.

## 2022-10-30 NOTE — Progress Notes (Signed)
Speech Language Pathology Daily Session Note  Patient Details  Name: NESANEL AGUILA MRN: [id number] Date of Birth: [birth year]  Today's Date: 10/30/2022 SLP Individual Time: 1400-1500 SLP Individual Time Calculation (min): 60 min  Short Term Goals: Week 1: SLP Short Term Goal 1 (Week 1): Pt will consume current diet with improved mastication, oral manipulation, minimal oral residuals, and minimal overt s/sx of aspiration with min A cues for implementation of swallowing precautions/strategies SLP Short Term Goal 2 (Week 1): Patient will implement speech intelligibility strategies at the word and short sentence level with 75% intelligiblity with mod A for implementation of speech intelligibility strategies SLP Short Term Goal 3 (Week 1): Patient will recall and demonstrate use of memory compensations to increase independence and safety at home with mod A verbal cues. SLP Short Term Goal 4 (Week 1): Patient will demonstrate functional problem solving for basic and familiar tasks with Mod A verbal cues. SLP Short Term Goal 5 (Week 1): Pt will recall and demonstrate use of word finding strategies within structured and/or natural contexts with minA verbal cues.  Skilled Therapeutic Interventions: Skilled ST treatment focused on speech and cognitive goals. Pt was greeted in bed on arrival. Participation c/b fatigue requiring min A verbal/tactile feedback to maintain attention throughout session. SLP provided education on speech compensatory strategies using "A Boss" acronym. Pt was unable to recall information following 1, 3 and 5 minute delays despite max A cues. Visual reinforcement in room was not beneficial secondary to reading difficulty, as pt was unable to read successfully at the word level. Pt was unable to provide insight on reading skills or vision at PLOF as compared to current level of function. Pt implemented speech strategies at the word level with max A to over-articulate words and pause  between syllables to achieve 50-75% intelligibility. And decreased carry over when not explicitly addressed. SLP facilitated problem solving to time with max A verbal/visual cues (e.g., "if it is 2 o'clock now, how many hours will it be until midnight?") in order to determine how much fluids he may be able to realistically consume by the end of the day (DO asking therapy team to push fluids). Pt stated he typically only drinks 1-2 cups of water per day and has only drank 1/2 cup so far today. Pt was agreeable to consume ~8oz of water during session and consumed without overt s/sx of aspiration. Pt stated he will try to drink 2 more cups of water by the end of the day but will need consistent encouragement by staff to execute. Pt performed oral care following set-up A. Patient was left in bed with alarm activated and immediate needs within reach at end of session. Continue per current plan of care.      Pain  None/denied  Therapy/Group: Individual Therapy  Patty Sermons 10/30/2022, 2:56 PM

## 2022-10-30 NOTE — Progress Notes (Signed)
Patient ID: Daniel [name], male   DOB: 11-17-1946, 76 y.o.   MRN: [id number] Met with the patient to review current situation, rehab process, team conference and plan of care. ELOS 12-14 days. Plans to return home with sister. Reviewed medications and secondary risks including HTN, HLD, old CVA; right arm contracted and was using a wheelchair PTA, BSC but sister kept up with his medications. Reviewed dietary modification recommendations for [facility] diet; increased vit D, Magnesium with iron deficiency and folic acid. Currently incontinent but notes he can call for assistance was able to use a BSC PTA and sister assisted with emptying. Continue to follow along to address educational needs to facilitate preparation for discharge home. Margarito Liner

## 2022-10-31 LAB — BASIC METABOLIC PANEL
Anion gap: 9 (ref 5–15)
BUN: 23 mg/dL (ref 8–23)
CO2: 19 mmol/L — ABNORMAL LOW (ref 22–32)
Calcium: 7.9 mg/dL — ABNORMAL LOW (ref 8.9–10.3)
Chloride: 110 mmol/L (ref 98–111)
Creatinine, Ser: 4.48 mg/dL — ABNORMAL HIGH (ref 0.61–1.24)
GFR, Estimated: 13 mL/min — ABNORMAL LOW (ref >60)
Glucose, Bld: 91 mg/dL (ref 70–99)
Potassium: 4.2 mmol/L (ref 3.5–5.1)
Sodium: 138 mmol/L (ref 135–145)

## 2022-10-31 MED ORDER — HYDRALAZINE HCL 50 MG PO TABS
75.0000 mg | ORAL_TABLET | Freq: Two times a day (BID) | ORAL | Status: DC
  Administered 2022-10-31 – 2022-11-02 (×4): 75 mg via ORAL
  Filled 2022-10-31 (×4): qty 1

## 2022-10-31 NOTE — Progress Notes (Signed)
Occupational Therapy Session Note  Patient Details  Name: Daniel [name] MRN: [id number] Date of Birth: [birth year]  Today's Date: 10/31/2022 OT Individual Time: 6168-3729 OT Individual Time Calculation (min): 45 min    Short Term Goals: Week 1:  OT Short Term Goal 1 (Week 1): Pt will maintian sitting balance min A for 3 minutes OT Short Term Goal 2 (Week 1): Pt will perform LB dressing mod A OT Short Term Goal 3 (Week 1): Pt will perform grooming mod A OT Short Term Goal 4 (Week 1): Further assess vision in functional context  Skilled Therapeutic Interventions/Progress Updates:    Upon OT arrival, pt sidelying in bed with incontinence of liquid stool and requests to get cleaned up. Pt agreeable to OT treatment. Treatment session with a focus on self care retraining. Pt requires total A for toileting tasks bed level with full linen change but performs rolling in bed with Min A to B sides. Pt performs supine to sit transfer with Mod A with use of bed rail and sits EOB to wash his upper body with CGA for sitting balance with verbal cues required to correct body positioning. Pt donns shirt with Mod A and pants with Max A. Pt stands with Max-Mod A with R lateral lean and difficulty correcting. Pt completes stand pivot transfer with Mod A to L into w/c and was left in w/c at end of session with all needs met and safety measures in place. Pt noted to have decreased attention to the R side during session.  Therapy Documentation Precautions:  Precautions Precautions: Fall, Other (comment) Precaution Comments: acute on chronic R hemiparesis with R UE and LE posturing/contractures Restrictions Weight Bearing Restrictions: No   Therapy/Group: Individual Therapy  Marvetta Gibbons 10/31/2022, 12:13 PM

## 2022-10-31 NOTE — Progress Notes (Signed)
Physical Therapy Session Note  Patient Details  Name: Daniel [name] MRN: [id number] Date of Birth: [birth year]  Today's Date: 10/31/2022 PT Individual Time: 1129-1214 and 1410-1505 PT Individual Time Calculation (min): 45 min and 55 min   Short Term Goals: Week 1:  PT Short Term Goal 1 (Week 1): Pt will perform supine<>sit with min assist using bed features as needed PT Short Term Goal 2 (Week 1): Pt will perform sit<>stand transfers using LRAD with min assist PT Short Term Goal 3 (Week 1): Pt will perform bed<>wheelchair transfers using LRAD with min assist consistently PT Short Term Goal 4 (Week 1): Pt will perform at least 92ft wheelchair mobility with no more than min assist  Skilled Therapeutic Interventions/Progress Updates:    Session 1: Pt received asleep, sitting in w/c - easily awakens and agreeable to therapy session. Assessed vitals: BP 147/77 (MAP 97), HR 92bpm   MD in/out for morning assessment and reinforced education to pt that he needs to drink 6 cups of water per day for his kidney health - therapist reinforced this during session by having pt drink 1 cup of water. Pt needs to have easy access to urinal during the day (including clothes moved to allow pt to independently access use of urinal).   Transported to/from gym in w/c for time management and energy conservation.  Block practice partial stand pivot transfers w/c<>EOM x4 reps with focus on problem solving safest technique for pt to be as independent as possible Towards L: CGA/light min assist for steadying/safety - pt comes to partial stand from w/c and then reaches L hand out onto mat to stable himself as he pivots his hips over onto mat taking ~1-2 steps towards EOM with L LE Towards R: heavy min/light mod assist for rotating hips and controlling descent into seat as pt unable to fully take lateral step with R LE to allow him to control this movement; attempted more of a squat pivot transfer to allow pt to use L  hemibody to control move of the rotation but this required increased assistance compared to using partial stand pivot technique - will continue to problem solve this with pt to meet his needs and movement patterns with limited R hemibody ROM  Transported back to his room.   Sit>stand w/c>RW with heavy min assist for lifting/balance and therapist doffed pants total assist. Pt positioned in w/c with brief open, towel lying across his lap, and urinal sitting on L w/c armrest to allow pt access to independently void bladder to promote increased fluid intake. R UE supported on 1/2 lap tray and call bell in reach.     Session 2: Pt received sitting in w/c and agreeable to therapy session. Per, NT pt was able to be successful at using urinal independently to void bladder in room with set-up above. Threaded on pants total assist for time management. Sit>stand w/c>RW with light mod assist for lifting to stand and heavy min assist for balance due to continued minor R lateral trunk lean (may also be due to hip posturing) - pulled pants up over hips dependently. Transported to/from gym in w/c for time management and energy conservation.  Sit>stand w/c> B UE support on litegait handles (therapist facilitating R hand placement onto bar) and guarding R knee but no buckling or instability noted.  Standing with min assist for balance donned litegait harness dependently. Requires seated rest break after this.  Gait training 16ft +30ft using litegait with partial BWS and therapist providing min assist  for weight shifting and managing litegait. Pt demonstrating the following gait deviations with therapist providing the described cuing and facilitation for improvement:  - pt with significant fear of falling during first gait trial limiting distance and resulting in more impaired gait mechanics that improved during 2nd trial - strong R pelvic weight shift with strong R lateral trunk lean, less severe during 2nd gait trial   - pt maintained in flexed/crouched posturing with excessive trunk/hip/knee flexion throughout - pt able to initiate steps bilaterally although very short step lengths lacking foot clearance - shorter R LE step lengths - pt hesitates to stand on R LE during stance phase but improving   Sit>stand w/c>RW with light mod/heavy min assist to come to standing and standing with light min assist for balance while doffing litegait harness.   Transported back to his room. L squat pivot transfer w/c>EOB with light min assist for balance while turning. Sit>stand EOB>L UE support on bedrail with min/mod assist to rise into standing to doff pants when pt noted to be incontinent of bowels in brief.  R partial stand pivot EOB>BSC with mod assist for pivoting hips and safely lowering onto BSC but pt unable to void further. Sit>stand BSC>L UE support on bedrail with min assist for rising and balance while +2 provided dependent peri-care.  L partial stand pivot back to EOB as described above. Sit>supine with mod assist for B LE management onto bed. Pt left supine in bed with brief open and urinal placed on pt's L side to allow independent, continent voiding of bladder - bed alarm on.     Therapy Documentation Precautions:  Precautions Precautions: Fall, Other (comment) Precaution Comments: acute on chronic R hemiparesis with R UE and LE posturing/contractures Restrictions Weight Bearing Restrictions: No   Pain:  Session 1: No reports of pain throughout session.  Session 2: No reports of pain throughout session.    Therapy/Group: Individual Therapy  Tawana Scale , PT, DPT, NCS, CSRS 10/31/2022, 7:44 AM

## 2022-10-31 NOTE — Progress Notes (Signed)
PROGRESS NOTE   Subjective/Complaints:  No acute complaints this AM. No events overnight.  Cr increased slightly on PO today; again, reinforced need for PO intakes. Patient states he is avoiding daytime POs due to concern for incontinent episodes during daytime. Aware of urge to go but unable to get to toilet in time. Agreeable to try timed toileting.   Nursing and staff aware, encouraging POs.   ROS:  Pt denies SOB, abd pain, CP, N/V/C/D, and vision changes   Objective:   No results found. No results for input(s): "WBC", "HGB", "HCT", "PLT" in the last 72 hours.  Recent Labs    10/30/22 0552 10/31/22 0656  NA 141 138  K 4.2 4.2  CL 111 110  CO2 20* 19*  GLUCOSE 94 91  BUN 21 23  CREATININE 4.26* 4.48*  CALCIUM 7.7* 7.9*     Intake/Output Summary (Last 24 hours) at 10/31/2022 1540 Last data filed at 10/31/2022 1344 Gross per 24 hour  Intake 820 ml  Output 1100 ml  Net -280 ml         Physical Exam: Vital Signs Blood pressure (!) 163/80, pulse 95, temperature 98.7 F (37.1 C), temperature source Oral, resp. rate 16, height 5\' 6"  (1.676 m), weight 69 kg, SpO2 100 %.   General: awake, alert, appropriate, NAD HENT: conjugate gaze; oropharynx moist; poor dentition. +facial droop CV: regular rate; no JVD Pulmonary: CTA B/L; no W/R/R- good air movement GI: soft, NT, ND, (+)BS- normoactive Psychiatric: appropriate MSK: Moving all 4 extremities  Skin: Skin is warm and dry. +RUE IV  Neuro: + Mild dysarthria, as well as some espressive aphasia- sentence level - stable. AAOx3. Follows all commands.     PE from prior encounter:  RUE- proximal 3/5; distal 2/5 LUE 5-/5 in same muscles RLE- HF 3/5; DF/PF 2/5- pt couldn't understand to test KE/KF LLE- 5-/5 in same muscles- KE/KF NT   Hoffman's RUE 2-3 beats clonus RLE Face intact to light touch, but decreased entire R side  Assessment/Plan: 1.  Functional deficits which require 3+ hours per day of interdisciplinary therapy in a comprehensive inpatient rehab setting. Physiatrist is providing close team supervision and 24 hour management of active medical problems listed below. Physiatrist and rehab team continue to assess barriers to discharge/monitor patient progress toward functional and medical goals  Care Tool:  Bathing    Body parts bathed by patient: Right arm, Left arm, Chest, Abdomen   Body parts bathed by helper: Left arm, Front perineal area, Buttocks, Right lower leg, Left lower leg, Face, Abdomen     Bathing assist Assist Level: Contact Guard/Touching assist     Upper Body Dressing/Undressing Upper body dressing   What is the patient wearing?: Pull over shirt    Upper body assist Assist Level: Moderate Assistance - Patient 50 - 74%    Lower Body Dressing/Undressing Lower body dressing      What is the patient wearing?: Pants, Underwear/pull up     Lower body assist Assist for lower body dressing: Maximal Assistance - Patient 25 - 49%     Toileting Toileting    Toileting assist Assist for toileting: Dependent - Patient 0%  Transfers Chair/bed transfer  Transfers assist  Chair/bed transfer activity did not occur: Safety/medical concerns  Chair/bed transfer assist level: Moderate Assistance - Patient 50 - 74%     Locomotion Ambulation   Ambulation assist   Ambulation activity did not occur: N/A (pt has not ambulated in 3 years, uses w/c for mobility baseline)          Walk 10 feet activity   Assist           Walk 50 feet activity   Assist           Walk 150 feet activity   Assist           Walk 10 feet on uneven surface  activity   Assist Walk 10 feet on uneven surfaces activity did not occur: N/A         Wheelchair     Assist Is the patient using a wheelchair?: Yes Type of Wheelchair: Manual    Wheelchair assist level: Maximal Assistance -  Patient 25 - 49% Max wheelchair distance: 170ft    Wheelchair 50 feet with 2 turns activity    Assist        Assist Level: Maximal Assistance - Patient 25 - 49%   Wheelchair 150 feet activity     Assist      Assist Level: Maximal Assistance - Patient 25 - 49%   Blood pressure (!) 163/80, pulse 95, temperature 98.7 F (37.1 C), temperature source Oral, resp. rate 16, height 5\' 6"  (1.676 m), weight 69 kg, SpO2 100 %.  Medical Problem List and Plan: 1. Functional deficits secondary to L MCA stroke with R sided weakness in setting of prior R hemiparesis             -patient may  shower             -ELOS/Goals: 12- 14 days supervision  -con't CIR-PT, OT and SLP   2.  Antithrombotics: -DVT/anticoagulation:  Pharmaceutical: Lovenox             -antiplatelet therapy: ASA 3. Pain Management: Tylenol prn.  4. Mood/Behavior/Sleep: LCSW to follow for evaluation and support.              --trazodone prn for insomnia.              -antipsychotic agents: N/A  5. Neuropsych/cognition: This patient is capable of making decisions on his own behalf. 6. Skin/Wound Care: Routine pressure relief measures. And skin /wound care due to incontinence 7. Fluids/Electrolytes/Nutrition: Monitor I/O. Check CMET in am.             --continue IVF and bicarb for now - remains on maintenance 75 cc/hr - DC maintenance, encourage PO 11/23; goal to stabilize to remove PO bicarb and re-initiate ACE   8. HTN: Monitor BP TID--continues to be labile. On hydralazine and amlodipine             --off ace due to acute on chronic renal failure  11/22- BP a little elevated- will monitor for trend and then change meds as required    10/31/2022    1:44 PM 10/31/2022    4:52 AM 10/30/2022    7:36 PM  Vitals with BMI  Systolic 462 703 500  Diastolic 80 86 74  Pulse 95 98 88               11/23: Consistently 150-170 SBP; increase Hydralazine to 50 mg BID  11/24: HTN remains uncontrolled. Cr and  BUN has remained stable last several checks; DC IVF, encourage PO, if stable over 1-2 days will re-initiate ACE               11/25: HTN ongoing - hydralazine to 75 mg BID  9. Acute on chronic renal failure (baseline 2.0-2.3 2021): Continue IVF. Strict I/O.  --SCr down from 6.4-->4.27-> 4.5->4.3   11/22- Cr up somewhat to 4.53 from 4.27- had been better last 3 days, however pt says not drinking well- will push fluids and recheck in AM              11/23: 1,500 IVF yesterday, PO 480 cc PO yesterday, Cr 4.3 today. Continue on PO and repeat in AM.               11/24: PO remains poor, patient remains on 75 cc/hr maintenance IVF, Cr and BUN stable. DC IVF today and recheck next 3 days.               11/25: Encourage PO one additional day; initiate timed toiletting so patient does not avoid fluids d/t incontinence. Labs in AM.    9. E coli UTI: On Ceftriaxone D# 5/7             --monitor voiding with PVR/Bladder scan 10. Anemia fo chronic disease: Drop in Hgb due to hydration             --continue iron supplement.  11. Vitamin D deficiency: Started on ergocalciferol on 11/15 12.  Hypocalcemia: Ca 8.1 w/ alb2.7-->continue supplement as corrects to almost normal.               -- Ca stable 8.1-7.7 11/23  13. Questionable Schizophrenia/Psych Hx: CIR note from 2010 reviewed.  14. UTI/PNA- on Ancef- finishing IV ABX 15. Incontinent of bowel and bladder- has condom cath and using brief-will monitor skin closely.              - LBM 11/25            - 11/25: Timed toiletting Q4H ordered for urinary incontinence; hesitant to DC flomax d/t prior retention  16. Low Mg  11/22- Up to 2.1- con't to monitor weekly and replete as required      LOS: 4 days A FACE TO [place] 10/31/2022, 3:40 PM

## 2022-10-31 NOTE — Progress Notes (Signed)
Speech Language Pathology Daily Session Note  Patient Details  Name: Daniel [name] MRN: [id number] Date of Birth: 1946/04/22  Today's Date: 10/31/2022 SLP Individual Time: 4174-0814 SLP Individual Time Calculation (min): 60 min  Short Term Goals: Week 1: SLP Short Term Goal 1 (Week 1): Pt will consume current diet with improved mastication, oral manipulation, minimal oral residuals, and minimal overt s/sx of aspiration with min A cues for implementation of swallowing precautions/strategies SLP Short Term Goal 2 (Week 1): Patient will implement speech intelligibility strategies at the word and short sentence level with 75% intelligiblity with mod A for implementation of speech intelligibility strategies SLP Short Term Goal 3 (Week 1): Patient will recall and demonstrate use of memory compensations to increase independence and safety at home with mod A verbal cues. SLP Short Term Goal 4 (Week 1): Patient will demonstrate functional problem solving for basic and familiar tasks with Mod A verbal cues. SLP Short Term Goal 5 (Week 1): Pt will recall and demonstrate use of word finding strategies within structured and/or natural contexts with minA verbal cues.  Skilled Therapeutic Interventions: Skilled ST treatment focused on dysphagia and cognitive-linguistic goals. Pt consumed morning meal containing dysphagia 1 and dysphagia 2 textures with mildly prolonged mastication, mild anterior spillage on right with puree and thin liquids, and mild pocketing on right mainly with dys 2 solids. Pt required sup-to-min A verbal cues to self monitor and cues to orally clear with lingual sweep, secondary sips, and wiping chin. Pt consumed meal without overt s/sx of aspiration. Considering remaining oral deficits, it is recommended pt remain on a dysphagia 2 diet with thin liquids. Pt educated and in agreement.   Pt reported he was responsible for managing medications at [facility]. Pt reported his sister picked up  medications while pt dealt with taking them independently on a daily basis. Pt also reported he paid the bills by writing out checks each month and sending payment through the mail. SLP simulated a bill payment/check writing task with overall max A for writing, and mod A for identifying where to put information on the check. Pt exhibited impaired writing as evidenced by impaired self formulation/spelling errors with most written content including signing his name, writing out desired amount (e.g., "two hundred and thirty dollars"), and the business name (e.g., "[employer]"). With max A verbal and visual cues, pt was able to partially copy text but required SLP to spell words for him letter by letter. Pt reported "I can't seem to figure it out" and reported this task was below pt's baseline. At this time, pt will require support with financial management at discharge. Will continue similar tasks involving iADLs.   Patient was left in bed with alarm activated and immediate needs within reach at end of session. Continue per current plan of care.       Pain  None/denied  Therapy/Group: Individual Therapy  [name] [name] 10/31/2022, 8:10 AM

## 2022-11-01 LAB — BASIC METABOLIC PANEL
Anion gap: 10 (ref 5–15)
BUN: 24 mg/dL — ABNORMAL HIGH (ref 8–23)
CO2: 19 mmol/L — ABNORMAL LOW (ref 22–32)
Calcium: 7.9 mg/dL — ABNORMAL LOW (ref 8.9–10.3)
Chloride: 109 mmol/L (ref 98–111)
Creatinine, Ser: 4.54 mg/dL — ABNORMAL HIGH (ref 0.61–1.24)
GFR, Estimated: 13 mL/min — ABNORMAL LOW (ref >60)
Glucose, Bld: 95 mg/dL (ref 70–99)
Potassium: 4.2 mmol/L (ref 3.5–5.1)
Sodium: 138 mmol/L (ref 135–145)

## 2022-11-02 LAB — CBC
HCT: 24.6 % — ABNORMAL LOW (ref 39.0–52.0)
Hemoglobin: 8.5 g/dL — ABNORMAL LOW (ref 13.0–17.0)
MCH: 29.9 pg (ref 26.0–34.0)
MCHC: 34.6 g/dL (ref 30.0–36.0)
MCV: 86.6 fL (ref 80.0–100.0)
Platelets: 304 10*3/uL (ref 150–400)
RBC: 2.84 MIL/uL — ABNORMAL LOW (ref 4.22–5.81)
RDW: 13.4 % (ref 11.5–15.5)
WBC: 4.7 10*3/uL (ref 4.0–10.5)
nRBC: 0 % (ref 0.0–0.2)

## 2022-11-02 LAB — BASIC METABOLIC PANEL
Anion gap: 14 (ref 5–15)
BUN: 26 mg/dL — ABNORMAL HIGH (ref 8–23)
CO2: 20 mmol/L — ABNORMAL LOW (ref 22–32)
Calcium: 7.9 mg/dL — ABNORMAL LOW (ref 8.9–10.3)
Chloride: 105 mmol/L (ref 98–111)
Creatinine, Ser: 4.69 mg/dL — ABNORMAL HIGH (ref 0.61–1.24)
GFR, Estimated: 12 mL/min — ABNORMAL LOW (ref >60)
Glucose, Bld: 93 mg/dL (ref 70–99)
Potassium: 4.2 mmol/L (ref 3.5–5.1)
Sodium: 139 mmol/L (ref 135–145)

## 2022-11-02 LAB — MAGNESIUM: Magnesium: 1.6 mg/dL — ABNORMAL LOW (ref 1.7–2.4)

## 2022-11-02 MED ORDER — SODIUM CHLORIDE 0.9 % IV SOLN
250.0000 mg | Freq: Every day | INTRAVENOUS | Status: AC
  Administered 2022-11-02 – 2022-11-03 (×2): 250 mg via INTRAVENOUS
  Filled 2022-11-02 (×2): qty 20

## 2022-11-02 MED ORDER — HYDRALAZINE HCL 50 MG PO TABS
75.0000 mg | ORAL_TABLET | Freq: Three times a day (TID) | ORAL | Status: DC
  Administered 2022-11-02 – 2022-11-12 (×28): 75 mg via ORAL
  Filled 2022-11-02 (×28): qty 1

## 2022-11-02 NOTE — Progress Notes (Signed)
Physical Therapy Session Note  Patient Details  Name: Daniel [name] MRN: [id number] Date of Birth: 10-05-1946  Today's Date: 11/02/2022 PT Individual Time: 7948-0165 PT Individual Time Calculation (min): 69 min   Short Term Goals: Week 1:  PT Short Term Goal 1 (Week 1): Pt will perform supine<>sit with min assist using bed features as needed PT Short Term Goal 2 (Week 1): Pt will perform sit<>stand transfers using LRAD with min assist PT Short Term Goal 3 (Week 1): Pt will perform bed<>wheelchair transfers using LRAD with min assist consistently PT Short Term Goal 4 (Week 1): Pt will perform at least 39ft wheelchair mobility with no more than min assist  Skilled Therapeutic Interventions/Progress Updates:    pt received in bed and agreeable to therapy. No complaint of pain, but reports tingling pain in the bottom of his feet, advised to discuss with MD, therapy to tolerance. Supine>sit with min A and increased time with use of bed features. Pt then performed modified stand pivot to L side with max A for safety. Pt transported to therapy gym for time management and energy conservation. Pt participated in block practice of transfers, x 3 to each side. With w/c positioned to L of mat table, pt able to perform modified stand pivot with CGA, with increased independence when transferring back into chair d/t poor ability to step RLE. With w/c positioned to R, pt initially reaches across with LUE, causing near LOB. Pt educated on and performed squat pivot with mod A throughout, with slightly lower levels of assist with repetition. Discussed safety with transfers and technique throughout. Pt then participated in standing marching with RW. Pt requires min A and  cueing to stand to walker. Max A for balance d/t strong R lean when upright. On second attempt, pt required max A d/t fatigue and was unable to maintain RUE grip on RW without tot A. Was unable to fully weight shift at this time. Pt propelled w/c  with hemi technique and occ min A for steering d/t chair height x 150 ft. Pt then transported remaining distance for time. Pt performed modified Stand pivot transfer to L using bed rail for LUE support. Sit>supine with mod A for BLE. Pt participated in boosting to [facility] and was left with all needs in reach and alarm active.   Therapy Documentation Precautions:  Precautions Precautions: Fall, Other (comment) Precaution Comments: acute on chronic R hemiparesis with R UE and LE posturing/contractures Restrictions Weight Bearing Restrictions: No General:       Therapy/Group: Individual Therapy  Mickel Fuchs 11/02/2022, 2:26 PM

## 2022-11-02 NOTE — Progress Notes (Signed)
Occupational Therapy Session Note  Patient Details  Name: Daniel [name] MRN: [id number] Date of Birth: [birth year]  Today's Date: 11/02/2022 OT Individual Time: 0903-1010 OT Individual Time Calculation (min): 67 min    Short Term Goals: Week 1:  OT Short Term Goal 1 (Week 1): Pt will maintian sitting balance min A for 3 minutes OT Short Term Goal 2 (Week 1): Pt will perform LB dressing mod A OT Short Term Goal 3 (Week 1): Pt will perform grooming mod A OT Short Term Goal 4 (Week 1): Further assess vision in functional context  Skilled Therapeutic Interventions/Progress Updates:     Pt received in bed with nursing staff present finishing assisting with Pt clean up following bowel movement in bed. Pt reporting 0/10 pain and receptive to skilled OT session with a focus on RUE NMRe-education, dynamic sitting balance, ADL retraining, and functional transfers.   Pt supine to EOB min A with HOB elevated and increased time. Pt completed UB bathing EOB using LUE to wash self and RUE to maintain seated balance. Weight bearing facilitated in RUE throughout tasks to support increased tone and muscle co-contraction. Pt able to maintain seated balance BUE support with stand by A. Pt noted to lean posteriorly or to R side when using LUE during bathing requiring min-mod A to lift trunk to correct center of balance. Moderate verbal cues provided for postural alignment, RUE positioning, and to push to L with RUE.   Pt educated on modified hemi-dressing technique donning shirt with mod verbal cues and min A to bring shirt over back and to maintain seated balance. Pt donned pants seated EOB with mod A to weave feet using modified technique. Pt completing sit>stand mod A to bring pants to waist mod A. Pt able to maintain seated balance with CGA-mod A for ~8 minutes during functional tasks. Pt brushed teeth seated at sink with mod A to don toothpaste. Pt transported total A to therapy gym in wc.   Pt completed  elbow flexion/extension and shoulder retraction/protraction exercises in gravity eliminated plane with RUE placed on wash cloth. Pt able to perform 2x5 reps of exercises with min A to complete full ROM with maximal effort.   Pt transported back to room in wc total A. Pt completed sit>stand min A to doff pants for easier access to use urinal. Pt mod A to doff pants from waist and off feet. Pt squat pivot to EOM min A. Pt educated on log rolling technique EOB>supine with mod verbal cues provided on technique and mod A to lift legs into bed. Pt handed off to SLP and left resting in bed with all needs met.  Therapy Documentation Precautions:  Precautions Precautions: Fall, Other (comment) Precaution Comments: acute on chronic R hemiparesis with R UE and LE posturing/contractures Restrictions Weight Bearing Restrictions: No   ADL: ADL Eating: Supervision/safety Where Assessed-Eating: Bed level Grooming: Maximal assistance, Maximal cueing Where Assessed-Grooming: Edge of bed Upper Body Bathing: Maximal assistance Where Assessed-Upper Body Bathing: Edge of bed Lower Body Bathing: Dependent Where Assessed-Lower Body Bathing: Edge of bed Upper Body Dressing: Maximal assistance Where Assessed-Upper Body Dressing: Edge of bed Lower Body Dressing: Dependent Where Assessed-Lower Body Dressing: Edge of bed Toileting: Dependent Where Assessed-Toileting: Other (Comment) (Condom cath and incontinent of bowel) Toilet Transfer: Unable to assess Toilet Transfer Method: Unable to assess Gaffer Transfer: Unable to assess Social research officer, government Method: Unable to assess   Therapy/Group: Individual Therapy  Janey Genta 11/02/2022, 12:41 PM

## 2022-11-02 NOTE — Progress Notes (Signed)
PROGRESS NOTE   Subjective/Complaints:  Incont of stool , no other issues Pt states strength no much different from baseline but ? If pt has good awareness of defs    ROS:  Pt denies SOB, abd pain, CP, N/V/C/D, and vision changes   Objective:   No results found. Recent Labs    11/02/22 0552  WBC 4.7  HGB 8.5*  HCT 24.6*  PLT 304    Recent Labs    11/01/22 0704 11/02/22 0552  NA 138 139  K 4.2 4.2  CL 109 105  CO2 19* 20*  GLUCOSE 95 93  BUN 24* 26*  CREATININE 4.54* 4.69*  CALCIUM 7.9* 7.9*     Intake/Output Summary (Last 24 hours) at 11/02/2022 0857 Last data filed at 11/02/2022 0802 Gross per 24 hour  Intake 1400 ml  Output 1450 ml  Net -50 ml         Physical Exam: Vital Signs Blood pressure (!) 141/78, pulse 95, temperature 99.2 F (37.3 C), temperature source Oral, resp. rate 16, height 5\' 6"  (1.676 m), weight 69 kg, SpO2 100 %.   General: No acute distress Mood and affect are appropriate Heart: Regular rate and rhythm no rubs murmurs or extra sounds Lungs: Clear to auscultation, breathing unlabored, no rales or wheezes Abdomen: Positive bowel sounds, soft nontender to palpation, nondistended Extremities: No clubbing, cyanosis, or edema Skin: No evidence of breakdown, no evidence of rash   Skin: Skin is warm and dry. +RUE IV  Neuro: + Mild dysarthria, as well as some espressive aphasia- sentence level - stable. AAOx3. Follows all commands.     PE from prior encounter:  RUE- proximal 3/5; distal 2/5 LUE 5-/5 in same muscles RLE- HF 3/5; DF/PF 2/5- pt couldn't understand to test KE/KF LLE- 5-/5 in same muscles- KE/KF NT   Hoffman's RUE 2-3 beats clonus RLE Face intact to light touch, but decreased entire R side  Assessment/Plan: 1. Functional deficits which require 3+ hours per day of interdisciplinary therapy in a comprehensive inpatient rehab setting. Physiatrist is  providing close team supervision and 24 hour management of active medical problems listed below. Physiatrist and rehab team continue to assess barriers to discharge/monitor patient progress toward functional and medical goals  Care Tool:  Bathing    Body parts bathed by patient: Right arm, Left arm, Chest, Abdomen   Body parts bathed by helper: Left arm, Front perineal area, Buttocks, Right lower leg, Left lower leg, Face, Abdomen     Bathing assist Assist Level: Contact Guard/Touching assist     Upper Body Dressing/Undressing Upper body dressing   What is the patient wearing?: Pull over shirt    Upper body assist Assist Level: Moderate Assistance - Patient 50 - 74%    Lower Body Dressing/Undressing Lower body dressing      What is the patient wearing?: Pants, Underwear/pull up     Lower body assist Assist for lower body dressing: Maximal Assistance - Patient 25 - 49%     Toileting Toileting    Toileting assist Assist for toileting: Dependent - Patient 0%     Transfers Chair/bed transfer  Transfers assist  Chair/bed transfer activity did not  occur: Safety/medical concerns  Chair/bed transfer assist level: Moderate Assistance - Patient 50 - 74%     Locomotion Ambulation   Ambulation assist   Ambulation activity did not occur: N/A (pt has not ambulated in 3 years, uses w/c for mobility baseline)          Walk 10 feet activity   Assist           Walk 50 feet activity   Assist           Walk 150 feet activity   Assist           Walk 10 feet on uneven surface  activity   Assist Walk 10 feet on uneven surfaces activity did not occur: N/A         Wheelchair     Assist Is the patient using a wheelchair?: Yes Type of Wheelchair: Manual    Wheelchair assist level: Maximal Assistance - Patient 25 - 49% Max wheelchair distance: 180ft    Wheelchair 50 feet with 2 turns activity    Assist        Assist Level:  Maximal Assistance - Patient 25 - 49%   Wheelchair 150 feet activity     Assist      Assist Level: Maximal Assistance - Patient 25 - 49%   Blood pressure (!) 141/78, pulse 95, temperature 99.2 F (37.3 C), temperature source Oral, resp. rate 16, height 5\' 6"  (1.676 m), weight 69 kg, SpO2 100 %.  Medical Problem List and Plan: 1. Functional deficits secondary to L MCA stroke with R sided weakness in setting of prior R hemiparesis             -patient may  shower             -ELOS/Goals: 12- 14 days supervision  -con't CIR-PT, OT and SLP   2.  Antithrombotics: -DVT/anticoagulation:  Pharmaceutical: Lovenox             -antiplatelet therapy: ASA 3. Pain Management: Tylenol prn.  4. Mood/Behavior/Sleep: LCSW to follow for evaluation and support.              --trazodone prn for insomnia.              -antipsychotic agents: N/A  5. Neuropsych/cognition: This patient is capable of making decisions on his own behalf. 6. Skin/Wound Care: Routine pressure relief measures. And skin /wound care due to incontinence 7. Fluids/Electrolytes/Nutrition: Monitor I/O. Check CMET in am.             --continue IVF and bicarb for now - remains on maintenance 75 cc/hr - DC maintenance, encourage PO 11/23; goal to stabilize to remove PO bicarb and re-initiate ACE   8. HTN: Monitor BP TID--continues to be labile. On hydralazine and amlodipine             --off ace due to acute on chronic renal failure  11/22- BP a little elevated- will monitor for trend and then change meds as required    11/01/2022    6:34 PM 11/01/2022    1:33 PM 11/01/2022    5:38 AM  Vitals with BMI  Systolic 384 536 468  Diastolic 78 81 86  Pulse 95 94 90               11/23: Consistently 150-170 SBP; increase Hydralazine to 50 mg BID              11/24: HTN remains uncontrolled. Cr and  BUN has remained stable last several checks; DC IVF, encourage PO, if stable over 1-2 days will re-initiate ACE               11/25: HTN  ongoing - hydralazine to 75 mg BID  9. Acute on chronic renal failure (baseline 2.0-2.3 2021): Continue IVF. Strict I/O.  --SCr down from 6.4-->4.27-> 4.5->4.3   11/22- Cr up somewhat to 4.53 from 4.27- had been better last 3 days, however pt says not drinking well- will push fluids and recheck in AM              11/23: 1,500 IVF yesterday, PO 480 cc PO yesterday, Cr 4.3 today. Continue on PO and repeat in AM.               11/24: PO remains poor, patient remains on 75 cc/hr maintenance IVF, Cr and BUN stable. DC IVF today and recheck next 3 days.               11/25: Encourage PO one additional day; initiate timed toiletting so patient does not avoid fluids d/t incontinence. Labs in AM.     Latest Ref Rng & Units 11/02/2022    5:52 AM 11/01/2022    7:04 AM 10/31/2022    6:56 AM  BMP  Glucose 70 - 99 mg/dL 93  95  91   BUN 8 - 23 mg/dL 26  24  23    Creatinine 0.61 - 1.24 mg/dL 4.69  4.54  4.48   Sodium 135 - 145 mmol/L 139  138  138   Potassium 3.5 - 5.1 mmol/L 4.2  4.2  4.2   Chloride 98 - 111 mmol/L 105  109  110   CO2 22 - 32 mmol/L 20  19  19    Calcium 8.9 - 10.3 mg/dL 7.9  7.9  7.9    No sig improvement will ask nephro to re eval   9. E coli UTI: On Ceftriaxone D# 5/7             --monitor voiding with PVR/Bladder scan 10. Anemia fo chronic disease: Drop in Hgb due to hydration             --continue iron supplement.  11. Vitamin D deficiency: Started on ergocalciferol on 11/15 12.  Hypocalcemia: Ca 8.1 w/ alb2.7-->continue supplement as corrects to almost normal.               -- Ca stable 8.1-7.7 11/23  13. Questionable Schizophrenia/Psych Hx: CIR note from 2010 reviewed.  14. UTI/PNA- on Ancef- finishing IV ABX 15. Incontinent of bowel and bladder- has condom cath and using brief-will monitor skin closely.              - LBM 11/25            - 11/25: Timed toiletting Q4H ordered for urinary incontinence; hesitant to DC flomax d/t prior retention  16. Low Mg  11/22- Up  to 2.1- con't to monitor weekly and replete as required      LOS: 6 days A FACE TO [place] E Lauralee Waters 11/02/2022, 8:57 AM

## 2022-11-02 NOTE — Progress Notes (Signed)
[facility] NEPHROLOGY PROGRESS NOTE  Assessment/ Plan: Pt is a 76 y.o. yo male  with past medical history significant for hypertension, HLD, BPH, prior stroke with residual right hemiparesis, CKD 3B, presented with right sided facial droop and slurred speech, seen as a consultation for the evaluation of acute kidney injury on CKD.   #Acute kidney injury on CKD stage IV: cr around 2.2-2.7 in 2021, no recent baseline creatinine level available.  Kidney ultrasound with chronic finding and volume loss.  This is likely acute kidney injury due to hemodynamically mediated in the setting of decreased oral intake, use of ACE inhibitor and UTI.   S/p supportive care with IV fluids.   - he is not too far from his recent baseline    - he will need renal follow-up upon discharge   - on flomax with hx urinary retention per primary team note   CKD stage IV - Cr 2.2 - 2.7 in 2021   #Acute left MCA ischemic stroke/acute bilateral SDH: No intervention per neurosurgeon.   s/p Neurology eval.  Per NS, only aspirin for anticoagulation.   #Hypertension: Improved most recently but still above goal.  Increase hydralazine to TID.  Currently is on hydralazine 75 mg BID   #Metabolic acidosis due to renal failure: Improved, on oral sodium bicarbonate.  #History of BPH: On Flomax.  US renal without hydronephrosis.   #Hypokalemia: improved  #Hypocalcemia/hypophosphatemia: Phosphate normal now; renal panel in am. Calcium improving.   # Anemia due to CKD: will give IV iron x 2 doses  Disposition - per rehab team    ----------------------   Subjective:  Dr. Joylene Grapes signed off on 11/20.  Reconsulted today for Creatinine trends.  Fluids were stopped a couple of days agao.  He had 1.5 liters UOP over 11/26.  At rehab after a stroke.  He offers little to supplement history; does report diarrhea as below   Review of systems:  Reports he is eating  Having some diarrhea No shortness of breath or chest  pain   Objective Vital signs in last 24 hours: Vitals:   10/31/22 1344 11/01/22 0538 11/01/22 1333 11/01/22 1834  BP: (!) 163/80 (!) 156/86 (!) 155/81 (!) 141/78  Pulse: 95 90 94 95  Resp: 16 16 16 16   Temp: 98.7 F (37.1 C) 98.4 F (36.9 C) 98 F (36.7 C) 99.2 F (37.3 C)  TempSrc: Oral Oral Oral Oral  SpO2: 100% 100% 100% 100%  Weight:      Height:       Weight change:   Intake/Output Summary (Last 24 hours) at 11/02/2022 1613 Last data filed at 11/02/2022 1300 Gross per 24 hour  Intake 1040 ml  Output 1050 ml  Net -10 ml       Labs: RENAL PANEL Recent Labs    10/20/22 1453 10/20/22 1501 10/21/22 1456 10/22/22 0427 10/23/22 0324 10/24/22 0405 10/25/22 0415 10/26/22 0348 10/27/22 0844 10/28/22 0549 10/29/22 0452 10/30/22 0552 10/31/22 0656 11/01/22 0704 11/02/22 0552  NA 137   < >  --  138 141 141 141 140 139 140 140 141 138  138 139  K 2.9*   < >  --  3.0* 4.4 4.7 4.5 4.6 4.3 4.6 4.4 4.2 4.2 4.2 4.2  CL 105   < >  --  111 112* 107 107 108 108 110 113* 111 110 109 105  CO2 19*   < >  --  19* 22 22 22  21* 21* 21* 21* 20* 19* 19*  20*  GLUCOSE 108*   < >  --  104* 93 97 99 92 96 94 94 94 91 95  93  BUN 41*   < >  --  40* 38* 36* 33* 31* 27* 28* 24* 21 23 24* 26*  CREATININE 5.85*   < >  --  5.16* 4.72* 4.65* 4.38* 4.38* 4.27* 4.53* 4.30* 4.26* 4.48* 4.54* 4.69*  CALCIUM 7.1*   < >  --  6.4* 6.6* 7.1* 7.4* 7.8* 8.1* 8.1* 7.7* 7.7* 7.9* 7.9* 7.9*  MG  --   --  1.5*  --  1.6*  --  1.6* 2.1  --  1.8  --   --   --   --  1.6*  PHOS  --   --   --   --  1.3* 2.3* 2.9 3.1 3.2  --   --   --   --   --   --   ALBUMIN 3.7  --   --  2.7* 2.6* 2.9* 2.6* 2.6* 2.7* 2.6*  --   --   --   --   --    < > = values in this interval not displayed.     Liver Function Tests: Recent Labs  Lab 10/27/22 0844 10/28/22 0549  AST  --  33  ALT  --  26  ALKPHOS  --  68  BILITOT  --  0.2*  PROT  --  5.8*  ALBUMIN 2.7* 2.6*   No results for input(s): "LIPASE", "AMYLASE" in the  last 168 hours. No results for input(s): "AMMONIA" in the last 168 hours. CBC: Recent Labs    10/22/22 0427 10/23/22 0318 10/23/22 0324 10/25/22 0415 10/28/22 0549 11/02/22 0552  HGB 8.6* 8.5*  --  8.5* 8.6* 8.5*  MCV 86.8 85.3  --  89.4 89.4 86.6  VITAMINB12  --   --  375  --   --   --   FOLATE  --   --  8.6  --   --   --   FERRITIN  --   --  94  --   --   --   TIBC  --   --  189*  --   --   --   IRON  --   --  18*  --   --   --   RETICCTPCT  --   --  0.8  --   --   --     Cardiac Enzymes: No results for input(s): "CKTOTAL", "CKMB", "CKMBINDEX", "TROPONINI" in the last 168 hours. CBG: No results for input(s): "GLUCAP" in the last 168 hours.  Iron Studies: No results for input(s): "IRON", "TIBC", "TRANSFERRIN", "FERRITIN" in the last 72 hours. Studies/Results: No results found.  Medications: Infusions:    Scheduled Medications:  amLODipine  10 mg Oral Daily   aspirin EC  81 mg Oral Daily   atorvastatin  40 mg Oral Daily   calcium carbonate  1 tablet Oral BID WC   enoxaparin (LOVENOX) injection  30 mg Subcutaneous QHS   ferrous sulfate  325 mg Oral Q breakfast   folic acid  1 mg Oral Daily   hydrALAZINE  75 mg Oral BID   magnesium gluconate  250 mg Oral Daily   nystatin  5 mL Oral QID   mouth rinse  15 mL Mouth Rinse 4 times per day   senna-docusate  1 tablet Oral BID   sodium bicarbonate  650 mg Oral TID   tamsulosin  0.4 mg Oral Daily   Vitamin D (Ergocalciferol)  50,000 Units Oral Q7 days    have reviewed scheduled and prn medications.  Physical Exam:  General adult male in bed in no acute distress HEENT normocephalic atraumatic Neck supple trachea midline Lungs clear to auscultation bilaterally normal work of breathing at rest  Heart S1S2 no rub Abdomen soft nontender nondistended Extremities no edema  Psych no anxiety or agitation; somewhat flat affect  Neuro - awake on arrival; provides limited history   Claudia Desanctis 11/02/2022,4:38 PM  LOS: 6  days

## 2022-11-02 NOTE — Progress Notes (Signed)
Blanchable redness noted to pt bilateral heels and lateral ankles. Foam dressing applied bilaterally.  Daniel Stack, LPN

## 2022-11-02 NOTE — Progress Notes (Signed)
Speech Language Pathology Daily Session Note  Patient Details  Name: Daniel [name] MRN: [id number] Date of Birth: 02-03-1946  Today's Date: 11/02/2022 SLP Individual Time: 1010-1105 SLP Individual Time Calculation (min): 55 min   Short Term Goals: Week 1: SLP Short Term Goal 1 (Week 1): Pt will consume current diet with improved mastication, oral manipulation, minimal oral residuals, and minimal overt s/sx of aspiration with min A cues for implementation of swallowing precautions/strategies SLP Short Term Goal 2 (Week 1): Patient will implement speech intelligibility strategies at the word and short sentence level with 75% intelligiblity with mod A for implementation of speech intelligibility strategies SLP Short Term Goal 3 (Week 1): Patient will recall and demonstrate use of memory compensations to increase independence and safety at home with mod A verbal cues. SLP Short Term Goal 4 (Week 1): Patient will demonstrate functional problem solving for basic and familiar tasks with Mod A verbal cues. SLP Short Term Goal 5 (Week 1): Pt will recall and demonstrate use of word finding strategies within structured and/or natural contexts with minA verbal cues.  Skilled Therapeutic Interventions: Skilled ST treatment focused on cognitive-communication and speech goals. Pt participation was somewhat limited due to drowsiness. SLP and pt completed a medication management task to increase awareness of current medication regime. SLP facilitated session by providing overall mod A verbal and visual cues for identifying pertinent medication on an individualized medication chart which contained the following information: name of medication, dose, purpose for taking, and how many times a day. Pt responded to pill label comprehension with 100% accuracy given sup A verbal cues. Pt responded to hypothetical scenarios with min A verbal cues for problem solving. Pt required mod A verbal cues to implement speech  intelligibility strategies at the sentence level to achieve 50% intelligibility; pt was perceived as 50-75% intelligibility at the word level. Pt did not exhibit any instances of word finding difficulty throughout session. Patient was left in bed with alarm activated and immediate needs within reach at end of session. Continue per current plan of care.      Pain Pain Assessment Pain Scale: 0-10 Pain Score: 0-No pain  Therapy/Group: Individual Therapy  Patty Sermons 11/02/2022, 10:52 AM

## 2022-11-03 LAB — RENAL FUNCTION PANEL
Albumin: 3 g/dL — ABNORMAL LOW (ref 3.5–5.0)
Anion gap: 12 (ref 5–15)
BUN: 25 mg/dL — ABNORMAL HIGH (ref 8–23)
CO2: 20 mmol/L — ABNORMAL LOW (ref 22–32)
Calcium: 7.9 mg/dL — ABNORMAL LOW (ref 8.9–10.3)
Chloride: 105 mmol/L (ref 98–111)
Creatinine, Ser: 4.7 mg/dL — ABNORMAL HIGH (ref 0.61–1.24)
GFR, Estimated: 12 mL/min — ABNORMAL LOW (ref >60)
Glucose, Bld: 95 mg/dL (ref 70–99)
Phosphorus: 4.1 mg/dL (ref 2.5–4.6)
Potassium: 4.3 mmol/L (ref 3.5–5.1)
Sodium: 137 mmol/L (ref 135–145)

## 2022-11-03 MED ORDER — SODIUM CHLORIDE 0.9 % IV SOLN: INTRAVENOUS | Status: AC

## 2022-11-03 MED ORDER — GABAPENTIN 100 MG PO CAPS
100.0000 mg | ORAL_CAPSULE | Freq: Every day | ORAL | Status: DC
  Administered 2022-11-03 – 2022-11-11 (×9): 100 mg via ORAL
  Filled 2022-11-03 (×9): qty 1

## 2022-11-03 NOTE — Progress Notes (Addendum)
Occupational Therapy Session Note  Patient Details  Name: Daniel [name] MRN: [id number] Date of Birth: 1946/07/22  Today's Date: 11/03/2022 OT Individual Time: 2585-2778 OT Individual Time Calculation (min): 64 min    Short Term Goals: Week 1:  OT Short Term Goal 1 (Week 1): Pt will maintian sitting balance min A for 3 minutes OT Short Term Goal 2 (Week 1): Pt will perform LB dressing mod A OT Short Term Goal 3 (Week 1): Pt will perform grooming mod A OT Short Term Goal 4 (Week 1): Further assess vision in functional context  Skilled Therapeutic Interventions/Progress Updates:  Pt received for skilled OT session semi-reclined in bed. OT session focused on ADL retraining and bed-level toileting/dressing due to episode of bowel incontinence. Pt agreeable to interventions, demonstrating overall pleasant mood. Pt reported 8-9/10 pain in bilateral legs. OT offered intermediate rest breaks and positioning suggestions throughout session to address pain/fatigue and maximize participation/safety in session.   Pt completes multiple R/L rolls in bed to complete toileting, sponge bath, and dressing. Pt initially requiring verbal/gesture cues to initiate rolls, but progressed to simple "roll towards me" cues. Pt sustains R/L side-lying position with use of bed rail, needing intermediate A to sustain L side lying due to fatigue.   Pt able to assist with bed-level peri-area cleaning this session, requiring Min A for thoroughness. Pt assists with donning of brief by using bridging technique and fastening tabs with L-hand. Seated EOB with mod A + bed rail, pt initiates doffing of t-shirt. Pt requires re-education of compensatory techniques to successfully doff t-shirt with A to support RUE. Pt requiring verbal cues to maintain seated position, demonstrating a significant posterior lean but tolerating ~10 minutes seated. Pt doffs bilateral socks with verbal encouragement, requiring total A to don clean socks  due to time constraints.   Pt completes UB/LB sponge bath with encouragement, set-up of needed items, and mod A to clean BUEs. Pt dons gown with Mod A for RUE due to fatigue levels and time constraints.   Pt remained semi-reclined in bed with all immediate needs meet and bed alarm activated at end of session. Pt continues to be appropriate for skilled OT intervention to address promote further functional independence.    Therapy Documentation Precautions:  Precautions Precautions: Fall, Other (comment) Precaution Comments: acute on chronic R hemiparesis with R UE and LE posturing/contractures Restrictions Weight Bearing Restrictions: No    Therapy/Group: Individual Therapy  Maudie Mercury, OTR/L, MSOT  11/03/2022, 1:15 PM

## 2022-11-03 NOTE — Progress Notes (Signed)
Physical Therapy Session Note  Patient Details  Name: Daniel [name] MRN: [id number] Date of Birth: 1946-10-18  Today's Date: 11/03/2022 PT Individual Time: 1051-1119 PT Individual Time Calculation (min): 28 min   Short Term Goals: Week 1:  PT Short Term Goal 1 (Week 1): Pt will perform supine<>sit with min assist using bed features as needed PT Short Term Goal 2 (Week 1): Pt will perform sit<>stand transfers using LRAD with min assist PT Short Term Goal 3 (Week 1): Pt will perform bed<>wheelchair transfers using LRAD with min assist consistently PT Short Term Goal 4 (Week 1): Pt will perform at least 29ft wheelchair mobility with no more than min assist  Skilled Therapeutic Interventions/Progress Updates:    Pt received sitting in w/c having been continent of bladder in urinal (charted) and agreeable to therapy session. Performed seated oral care from w/c level at sink requiring assistance to open toothpaste (pt reports at home he would leave cap off or use different type of cap that can be managed with 1 hand) and required use of spit basin due to pt being unable to lean trunk forward enough to spit into sink - intermittent assist to manage oral care tools.   Pt requesting to return to bed. L stand pivot w/c>EOB with CGA/light min assist for steadying, guarding R knee with pt having slight bending during stance while stepping L LE laterally towards bed but not true buckling, pt continues to require L UE support on bed to maintain balance while turning.  Sit>supine HOB slightly elevated and using bedrail, with pt continuing to have difficulty motor planning this task and difficulty following cuing to make it more independent; however, able to progressed to requiring heavy min/light mod assist primarily for R LE management onto bed and faciltiating improved trunk alignment.  Pt left supine in bed with needs in reach, brief and urinal positioned to allow pt continent void of bladder - therapist  continuing to encourage fluids and pt had consumed 1 cup of water since earlier AM session. Pt left with needs in reach, bed alarm on, lines intact, and water cup full in reach.  Therapy Documentation Precautions:  Precautions Precautions: Fall, Other (comment) Precaution Comments: acute on chronic R hemiparesis with R UE and LE posturing/contractures Restrictions Weight Bearing Restrictions: No   Pain: No reports of pain throughout session.   Therapy/Group: Individual Therapy  Tawana Scale , PT, DPT, NCS, CSRS 11/03/2022, 11:18 AM

## 2022-11-03 NOTE — Progress Notes (Signed)
[facility] NEPHROLOGY PROGRESS NOTE  Assessment/ Plan: Pt is a 76 y.o. yo male  with past medical history significant for hypertension, HLD, BPH, prior stroke with residual right hemiparesis, CKD 3B, presented with right sided facial droop and slurred speech, seen as a consultation for the evaluation of acute kidney injury on CKD.   #Acute kidney injury on CKD stage IV: cr around 2.2-2.7 in 2021, no recent baseline creatinine level available.  Kidney ultrasound with chronic finding and volume loss.  This is likely acute kidney injury due to hemodynamically mediated in the setting of decreased oral intake, use of ACE inhibitor and UTI.   S/p supportive care with IV fluids.   - he will need renal follow-up upon discharge   - on flomax with hx urinary retention per primary team note  - reordered strict ins/outs - PO intake is down per his report and he reported diarrhea - will give NS at 75 ml/hr x  24 hours  CKD stage IV - Cr 2.2 - 2.7 in 2021   #Acute left MCA ischemic stroke/acute bilateral SDH: No intervention per neurosurgeon.   s/p Neurology eval.  Per NS, only aspirin for anticoagulation.   #Hypertension: Improved most recently but still above goal.  Increased hydralazine to TID.    #Metabolic acidosis due to renal failure: Improved, on oral sodium bicarbonate.  #History of BPH: On Flomax.  US renal without hydronephrosis.   #Hypokalemia: improved  #Hypocalcemia/hypophosphatemia: Phosphate normal now; Calcium improving.   # Anemia due to CKD: will give IV iron x 2 doses   Disposition - per rehab team    ----------------------   Subjective:   strict ins/outs are not available.  He had 550 ml uop as well as 2 unmeasured voids over 11/27 recorded.  He states he's not eating much of his meals. NT states that she will be back to help him - she's brought his tray.  She states no diarrhea today that she was made aware of     Review of systems:  Denies n/v Reports he  is eating but not much No shortness of breath or chest pain   Objective Vital signs in last 24 hours: Vitals:   11/02/22 1621 11/02/22 2037 11/03/22 0509 11/03/22 1445  BP: (!) 158/80 (!) 181/93 (!) 160/75 136/64  Pulse: 91 (!) 107 97 96  Resp: 16 16 17 19   Temp: 98.8 F (37.1 C) 98.8 F (37.1 C) 98 F (36.7 C) 99 F (37.2 C)  TempSrc: Oral Oral  Oral  SpO2: 100% 95% 93% 99%  Weight:   67.1 kg   Height:       Weight change:   Intake/Output Summary (Last 24 hours) at 11/03/2022 1723 Last data filed at 11/03/2022 1300 Gross per 24 hour  Intake 900.11 ml  Output 625 ml  Net 275.11 ml       Labs: RENAL PANEL Recent Labs    10/20/22 1453 10/20/22 1501 10/21/22 1456 10/22/22 0427 10/23/22 0324 10/24/22 0405 10/25/22 0415 10/26/22 0348 10/27/22 0844 10/28/22 0549 10/29/22 0452 10/30/22 0552 10/31/22 0656 11/01/22 0704 11/02/22 0552 11/03/22 0659  NA 137   < >  --  138 141 141 141 140 139 140 140 141 138  138 139 137  K 2.9*   < >  --  3.0* 4.4 4.7 4.5 4.6 4.3 4.6 4.4 4.2 4.2 4.2 4.2 4.3  CL 105   < >  --  111 112* 107 107 108 108 110 113* 111 110  109 105 105  CO2 19*   < >  --  19* 22 22 22  21* 21* 21* 21* 20* 19* 19* 20* 20*  GLUCOSE 108*   < >  --  104* 93 97 99 92 96 94 94 94 91 95  93 95  BUN 41*   < >  --  40* 38* 36* 33* 31* 27* 28* 24* 21 23 24* 26* 25*  CREATININE 5.85*   < >  --  5.16* 4.72* 4.65* 4.38* 4.38* 4.27* 4.53* 4.30* 4.26* 4.48* 4.54* 4.69* 4.70*  CALCIUM 7.1*   < >  --  6.4* 6.6* 7.1* 7.4* 7.8* 8.1* 8.1* 7.7* 7.7* 7.9* 7.9* 7.9* 7.9*  MG  --   --  1.5*  --  1.6*  --  1.6* 2.1  --  1.8  --   --   --   --  1.6*  --   PHOS  --   --   --   --  1.3* 2.3* 2.9 3.1 3.2  --   --   --   --   --   --  4.1  ALBUMIN 3.7  --   --  2.7* 2.6* 2.9* 2.6* 2.6* 2.7* 2.6*  --   --   --   --   --  3.0*   < > = values in this interval not displayed.     Liver Function Tests: Recent Labs  Lab 10/28/22 0549 11/03/22 0659  AST 33  --   ALT 26  --   ALKPHOS  68  --   BILITOT 0.2*  --   PROT 5.8*  --   ALBUMIN 2.6* 3.0*   No results for input(s): "LIPASE", "AMYLASE" in the last 168 hours. No results for input(s): "AMMONIA" in the last 168 hours. CBC: Recent Labs    10/22/22 0427 10/23/22 0318 10/23/22 0324 10/25/22 0415 10/28/22 0549 11/02/22 0552  HGB 8.6* 8.5*  --  8.5* 8.6* 8.5*  MCV 86.8 85.3  --  89.4 89.4 86.6  VITAMINB12  --   --  375  --   --   --   FOLATE  --   --  8.6  --   --   --   FERRITIN  --   --  94  --   --   --   TIBC  --   --  189*  --   --   --   IRON  --   --  18*  --   --   --   RETICCTPCT  --   --  0.8  --   --   --     Cardiac Enzymes: No results for input(s): "CKTOTAL", "CKMB", "CKMBINDEX", "TROPONINI" in the last 168 hours. CBG: No results for input(s): "GLUCAP" in the last 168 hours.  Iron Studies: No results for input(s): "IRON", "TIBC", "TRANSFERRIN", "FERRITIN" in the last 72 hours. Studies/Results: No results found.  Medications: Infusions:    Scheduled Medications:  amLODipine  10 mg Oral Daily   aspirin EC  81 mg Oral Daily   atorvastatin  40 mg Oral Daily   calcium carbonate  1 tablet Oral BID WC   enoxaparin (LOVENOX) injection  30 mg Subcutaneous QHS   ferrous sulfate  325 mg Oral Q breakfast   folic acid  1 mg Oral Daily   gabapentin  100 mg Oral QHS   hydrALAZINE  75 mg Oral Q8H   magnesium gluconate  250 mg Oral  Daily   nystatin  5 mL Oral QID   mouth rinse  15 mL Mouth Rinse 4 times per day   senna-docusate  1 tablet Oral BID   sodium bicarbonate  650 mg Oral TID   tamsulosin  0.4 mg Oral Daily   Vitamin D (Ergocalciferol)  50,000 Units Oral Q7 days    have reviewed scheduled and prn medications.  Physical Exam:   General adult male in bed in no acute distress HEENT normocephalic atraumatic Neck supple trachea midline Lungs clear to auscultation bilaterally normal work of breathing at rest  Heart S1S2 no rub Abdomen soft nontender nondistended Extremities no edema   Psych no anxiety or agitation; somewhat flat affect  Neuro - awake on arrival; provides limited history   Claudia Desanctis, MD 11/03/2022,5:34 PM  LOS: 7 days

## 2022-11-03 NOTE — Progress Notes (Addendum)
Patient ID: Daniel [name], male   DOB: 1946/08/25, 76 y.o.   MRN: [id number]  SW left VM for patient sister for request of current WC photo.   10:27 AM SW reached out to patient niece to attempt to schedule family education. Patient niece currently unsure what day she will be able to attend education due to her children and her child sitting. Niece would like SW to follow up after conference to potentially determine after d/c date.

## 2022-11-03 NOTE — Progress Notes (Signed)
PROGRESS NOTE   Subjective/Complaints:  Appreciate nephro note   Tingling pain at noc B feet  ROS:  Pt denies SOB, abd pain, CP, N/V/C/D, and vision changes   Objective:   No results found. Recent Labs    11/02/22 0552  WBC 4.7  HGB 8.5*  HCT 24.6*  PLT 304    Recent Labs    11/02/22 0552 11/03/22 0659  NA 139 137  K 4.2 4.3  CL 105 105  CO2 20* 20*  GLUCOSE 93 95  BUN 26* 25*  CREATININE 4.69* 4.70*  CALCIUM 7.9* 7.9*     Intake/Output Summary (Last 24 hours) at 11/03/2022 0750 Last data filed at 11/02/2022 2213 Gross per 24 hour  Intake 948.58 ml  Output 550 ml  Net 398.58 ml         Physical Exam: Vital Signs Blood pressure (!) 160/75, pulse 97, temperature 98 F (36.7 C), resp. rate 17, height 5\' 6"  (1.676 m), weight 67.1 kg, SpO2 93 %.   General: No acute distress Mood and affect are appropriate Heart: Regular rate and rhythm no rubs murmurs or extra sounds Lungs: Clear to auscultation, breathing unlabored, no rales or wheezes Abdomen: Positive bowel sounds, soft nontender to palpation, nondistended Extremities: No clubbing, cyanosis, or edema Skin: No evidence of breakdown, no evidence of rash   Skin: Skin is warm and dry. +RUE IV  Neuro: + Mild dysarthria, as well as some espressive aphasia- sentence level - stable. AAOx3. Follows all commands.     PE from prior encounter:  RUE- proximal 3/5; distal 2/5 LUE 5-/5 in same muscles RLE- HF 3/5; DF/PF 2/5- pt couldn't understand to test KE/KF LLE- 5-/5 in same muscles- KE/KF NT   Tone increased MAS 3 in R elbow and knee flexors Sensation parasthesia both feet with LT exam    Assessment/Plan: 1. Functional deficits which require 3+ hours per day of interdisciplinary therapy in a comprehensive inpatient rehab setting. Physiatrist is providing close team supervision and 24 hour management of active medical problems listed  below. Physiatrist and rehab team continue to assess barriers to discharge/monitor patient progress toward functional and medical goals  Care Tool:  Bathing    Body parts bathed by patient: Right arm, Left arm, Chest, Abdomen   Body parts bathed by helper: Left arm, Front perineal area, Buttocks, Right lower leg, Left lower leg, Face, Abdomen     Bathing assist Assist Level: Contact Guard/Touching assist     Upper Body Dressing/Undressing Upper body dressing   What is the patient wearing?: Pull over shirt    Upper body assist Assist Level: Moderate Assistance - Patient 50 - 74%    Lower Body Dressing/Undressing Lower body dressing      What is the patient wearing?: Pants, Underwear/pull up     Lower body assist Assist for lower body dressing: Maximal Assistance - Patient 25 - 49%     Toileting Toileting    Toileting assist Assist for toileting: Dependent - Patient 0%     Transfers Chair/bed transfer  Transfers assist  Chair/bed transfer activity did not occur: Safety/medical concerns  Chair/bed transfer assist level: Moderate Assistance - Patient 50 - 74%  Locomotion Ambulation   Ambulation assist   Ambulation activity did not occur: N/A (pt has not ambulated in 3 years, uses w/c for mobility baseline)          Walk 10 feet activity   Assist           Walk 50 feet activity   Assist           Walk 150 feet activity   Assist           Walk 10 feet on uneven surface  activity   Assist Walk 10 feet on uneven surfaces activity did not occur: N/A         Wheelchair     Assist Is the patient using a wheelchair?: Yes Type of Wheelchair: Manual    Wheelchair assist level: Maximal Assistance - Patient 25 - 49% Max wheelchair distance: 173ft    Wheelchair 50 feet with 2 turns activity    Assist        Assist Level: Maximal Assistance - Patient 25 - 49%   Wheelchair 150 feet activity     Assist       Assist Level: Maximal Assistance - Patient 25 - 49%   Blood pressure (!) 160/75, pulse 97, temperature 98 F (36.7 C), resp. rate 17, height 5\' 6"  (1.676 m), weight 67.1 kg, SpO2 93 %.  Medical Problem List and Plan: 1. Functional deficits secondary to L MCA stroke with R sided weakness in setting of prior R hemiparesis Spasticity R elbow and knee flexors will consider  botox             -patient may  shower             -ELOS/Goals: 12- 14 days supervision  -con't CIR-PT, OT and SLP   2.  Antithrombotics: -DVT/anticoagulation:  Pharmaceutical: Lovenox             -antiplatelet therapy: ASA 3. Pain Management: Tylenol prn.  Bilateral foot pain worse at night , no hx of DM but sx and exam most consistent with neuropathic pain , will trial gabapentin low dose  4. Mood/Behavior/Sleep: LCSW to follow for evaluation and support.              --trazodone prn for insomnia.              -antipsychotic agents: N/A  5. Neuropsych/cognition: This patient is capable of making decisions on his own behalf. 6. Skin/Wound Care: Routine pressure relief measures. And skin /wound care due to incontinence 7. Fluids/Electrolytes/Nutrition: Monitor I/O. Check CMET in am.             --continue IVF and bicarb for now - remains on maintenance 75 cc/hr - DC maintenance, encourage PO 11/23; goal to stabilize to remove PO bicarb and re-initiate ACE - appreciate Nephro note will ask f/u question on those issues   8. HTN: Monitor BP TID--continues to be labile. On hydralazine and amlodipine             --off ace due to acute on chronic renal failure  11/22- BP a little elevated- will monitor for trend and then change meds as required    11/03/2022    5:09 AM 11/02/2022    8:37 PM 11/02/2022    4:21 PM  Vitals with BMI  Weight 147 lbs 15 oz    BMI 13.24    Systolic 401 027 253  Diastolic 75 93 80  Pulse 97 107 91  11/23: Consistently 150-170 SBP; increase Hydralazine to 50 mg BID               11/24: HTN remains uncontrolled. Cr and BUN has remained stable last several checks; DC IVF, encourage PO, if stable over 1-2 days will re-initiate ACE               11/25: HTN ongoing - hydralazine to 75 mg BID  9. Acute on chronic renal failure (baseline 2.0-2.3 2021): Continue IVF. Strict I/O.  --SCr down from 6.4-->4.27-> 4.5->4.3   11/22- Cr up somewhat to 4.53 from 4.27- had been better last 3 days, however pt says not drinking well- will push fluids and recheck in AM              11/23: 1,500 IVF yesterday, PO 480 cc PO yesterday, Cr 4.3 today. Continue on PO and repeat in AM.               11/24: PO remains poor, patient remains on 75 cc/hr maintenance IVF, Cr and BUN stable. DC IVF today and recheck next 3 days.               11/25: Encourage PO one additional day; initiate timed toiletting so patient does not avoid fluids d/t incontinence. Labs in AM.     Latest Ref Rng & Units 11/03/2022    6:59 AM 11/02/2022    5:52 AM 11/01/2022    7:04 AM  BMP  Glucose 70 - 99 mg/dL 95  93  95   BUN 8 - 23 mg/dL 25  26  24    Creatinine 0.61 - 1.24 mg/dL 4.70  4.69  4.54   Sodium 135 - 145 mmol/L 137  139  138   Potassium 3.5 - 5.1 mmol/L 4.3  4.2  4.2   Chloride 98 - 111 mmol/L 105  105  109   CO2 22 - 32 mmol/L 20  20  19    Calcium 8.9 - 10.3 mg/dL 7.9  7.9  7.9   See nephro note   9. E coli UTI: completed abx             --monitor voiding with PVR/Bladder scan 10. Anemia fo chronic disease: Drop in Hgb due to hydration             --continue iron supplement.  11. Vitamin D deficiency: Started on ergocalciferol on 11/15 12.  Hypocalcemia: Ca 8.1 w/ alb2.7-->continue supplement as corrects to almost normal.               -- Ca stable 8.1-7.7 11/23  13. Questionable Schizophrenia/Psych Hx: CIR note from 2010 reviewed.  14. UTI/PNA- on Ancef- finishing IV ABX 15. Incontinent of bowel and bladder- has condom cath and using brief-will monitor skin closely.              - LBM 11/25             - 11/25: Timed toiletting Q4H ordered for urinary incontinence; hesitant to DC flomax d/t prior retention  16. Low Mg  11/22- Up to 2.1- con't to monitor weekly and replete as required      LOS: 7 days A FACE TO FACE EVALUATION WAS PERFORMED  Charlett Blake 11/03/2022, 7:50 AM

## 2022-11-03 NOTE — Progress Notes (Signed)
Physical Therapy Session Note  Patient Details  Name: Daniel [name] MRN: [id number] Date of Birth: [birth year]  Today's Date: 11/03/2022 PT Individual Time: 0830-0921 PT Individual Time Calculation (min): 51 min   and  Today's Date: 11/03/2022 PT Co-Treatment Time: 0932-6712 (full co-tx from 0800 - 0830) PT Co-Treatment Time Calculation (min): 15 min  Short Term Goals: Week 1:  PT Short Term Goal 1 (Week 1): Pt will perform supine<>sit with min assist using bed features as needed PT Short Term Goal 2 (Week 1): Pt will perform sit<>stand transfers using LRAD with min assist PT Short Term Goal 3 (Week 1): Pt will perform bed<>wheelchair transfers using LRAD with min assist consistently PT Short Term Goal 4 (Week 1): Pt will perform at least 5ft wheelchair mobility with no more than min assist  Skilled Therapeutic Interventions/Progress Updates:  Pt received supine in bed with nurses present and pt agreeable to therapy session with plan to start with custom manual wheelchair evaluation. Cecille Rubin, OT present at beginning of session for co-treat to discuss w/c recommendations.  Pt able to transfer supine>sitting L EOB, HOB slightly elevated and using bedrail, with increased time and min assist primarily to bring trunk upright - cuing to extend R LE to advance it over to EOB. Sitting EOB pt able to complete UB and LB dressing with assistance from OT to reinforce proper hemi-technique for dressing.  Brandon, ATP present for custom manual wheelchair evaluation. Pt tolerated sitting EOB for ~5minutes during custom manual wheelchair consult to assess his posture, sitting balance, extremity ROM and strength as well as actively participating in conversation regarding wheelchair recommendations. Discussed the following recommendations regarding a custom manual wheelchair:  - need for K0005 chair to allow adjustable axle position to improve access to wheels and promote optimal biomechanical alignment for  efficient propulsion and prevention of repetitive stress injuries as well as allow proper center of gravity due to impaired trunk control and posturing - need for plastic coated rims to improve grasp and propulsion efficiency - positioning cushion due to pelvic obliquity with incontinence cover  - contoured back support that comes up to his shoulders to support trunk in midline due to impaired sitting balance - push-to-lock brakes with extension handles to provide pt independence with this  - hemi-height wheelchair to allow pt to utilize L LE for propulsion to become modified independent - R UE 1/2 lap tray for limb support and positioning  Pt in agreement with recommendations and planning to receive loaner wheelchair through [employer] - will be delivered later today. OT exiting and remainder of session was individual therapy.  Pt reports urge to have BM, started being incontinent in brief and further continent on BSC (documented in Flowsheet).  R partial stand pivot transfer EOB>BSC with heavy min assist for balance while turning and eccentric control while sitting with therapist manually facilitating improved R LE lateral step. Sit<>stand BSC<>L UE support on bedrail with min assist - dependent LB clothing management and peri-care.   L stand pivot transfer BSC>EOB with CGA/light min assist for balance while turning this direction and pt with poor eccentric lowering and requiring L UE support on bed for balance.  R stand pivot back to w/c as described above. Pt set-up to finish eating breakfast.  Sit<>stand w/c<>L UE support on armrest with light min assist during dependent LB clothing management.   Pt left seated in w/c with brief and urinal in position to allow pt to continently void between sessions (pt covered with towel and  curtain pulled for pt privacy). Pt left seated in w/c with needs in reach and R UE supported on 1/2 lap tray.     Therapy Documentation Precautions:   Precautions Precautions: Fall, Other (comment) Precaution Comments: acute on chronic R hemiparesis with R UE and LE posturing/contractures Restrictions Weight Bearing Restrictions: No   Pain:  Reports pain in both LEs described as "muscle ache" - provided repositioning, and movement for pain management.     Therapy/Group: Individual Therapy and Co-Treatment  Tawana Scale , PT, DPT, NCS, CSRS 11/03/2022, 7:43 AM

## 2022-11-03 NOTE — Progress Notes (Addendum)
Speech Language Pathology Daily Session Note  Patient Details  Name: Daniel [name] MRN: [id number] Date of Birth: December 21, 1945  Today's Date: 11/03/2022 SLP Individual Time: 4098-1191 SLP Individual Time Calculation (min): 45 min and Today's Date: 11/03/2022 SLP Missed Time: 15 Minutes Missed Time Reason: Patient fatigue  Short Term Goals: Week 1: SLP Short Term Goal 1 (Week 1): Pt will consume current diet with improved mastication, oral manipulation, minimal oral residuals, and minimal overt s/sx of aspiration with min A cues for implementation of swallowing precautions/strategies SLP Short Term Goal 2 (Week 1): Patient will implement speech intelligibility strategies at the word and short sentence level with 75% intelligiblity with mod A for implementation of speech intelligibility strategies SLP Short Term Goal 3 (Week 1): Patient will recall and demonstrate use of memory compensations to increase independence and safety at home with mod A verbal cues. SLP Short Term Goal 4 (Week 1): Patient will demonstrate functional problem solving for basic and familiar tasks with Mod A verbal cues. SLP Short Term Goal 5 (Week 1): Pt will recall and demonstrate use of word finding strategies within structured and/or natural contexts with minA verbal cues.  Skilled Therapeutic Interventions: Skilled ST treatment focused on cognitive goals. Pt was sleeping in bed on arrival and roused to min verbal stimuli. Pt remained drowsy throughout session requiring frequent cueing throughout to maintain alertness and attention. When asked, pt stated he has not been sleeping well secondary to his TV being kept on at night. Per further discussion, pt exhibited minimal attempts to problem solve to avoid this issue from continuing (turn off TV if able OR call for help to turn off T). With max A verbal/visual cues, pt stated "I could call to have someone turn it off." Pt required max A verbal cues and extended time to locate  and activate "TV power" button and min A to locate red nurses button. Although pt located nurse button within a reasonable amount of time, he required verbal coaching and extended time to activate button successfully. SLP also placed sign above HOB to request staff to ensure TV is turned off at bed time. Max A verbal cues needed for implementation of speech strategies at the word level. Speech intelligibility largely reduced secondary to sleepiness/fatigue. SLP attempted to facilitate a medication management task as continuation from yesterday's session, however drowsiness interfered with participation and session was prematurely. Patient was left in bed with alarm activated and immediate needs within reach at end of session. Continue per current plan of care.      Pain  None/denied  Therapy/Group: Individual Therapy  Patty Sermons 11/03/2022, 2:55 PM

## 2022-11-03 NOTE — Progress Notes (Signed)
Occupational Therapy Session Note  Patient Details  Name: Daniel [name] MRN: [id number] Date of Birth: May 03, 1946  Today's Date: 11/03/2022 OT Co-Treatment Time: 8891-6945 OT Co-Treatment Time Calculation (min): 15 min   Short Term Goals: Week 1:  OT Short Term Goal 1 (Week 1): Pt will maintian sitting balance min A for 3 minutes OT Short Term Goal 2 (Week 1): Pt will perform LB dressing mod A OT Short Term Goal 3 (Week 1): Pt will perform grooming mod A OT Short Term Goal 4 (Week 1): Further assess vision in functional context  Skilled Therapeutic Interventions/Progress Updates:     Pt seen this AM 08:00-08:30 for co-treat session with PT, Carly, with a focus on custom wc evaluation. Pt received with nursing staff present and Pt receptive to skilled OT session reporting 0/10 pain.   Pt supine>sitting L EOB with Hob slightly elevated using bed features min A with increased time and cueing on body mechanics to lift trunk. Pt able to complete U/LB dressing with mod I today with mod verbal cues provided on hemi-dressing technique. Pt able to tolerate sitting EOB with BUEs supported throughout custom wc evaluation and actively participated in conversation. Pt was left resting EOB with PT present for continuation of wc evaluation.   Therapy Documentation Precautions:  Precautions Precautions: Fall, Other (comment) Precaution Comments: acute on chronic R hemiparesis with R UE and LE posturing/contractures Restrictions Weight Bearing Restrictions: No General:   Vital Signs: Therapy Vitals Temp: 98 F (36.7 C) Pulse Rate: 97 Resp: 17 BP: (Abnormal) 160/75 Patient Position (if appropriate): Lying Oxygen Therapy SpO2: 93 % O2 Device: Room Air Pain:   ADL: ADL Eating: Supervision/safety Where Assessed-Eating: Bed level Grooming: Maximal assistance, Maximal cueing Where Assessed-Grooming: Edge of bed Upper Body Bathing: Maximal assistance Where Assessed-Upper Body Bathing:  Edge of bed Lower Body Bathing: Dependent Where Assessed-Lower Body Bathing: Edge of bed Upper Body Dressing: Maximal assistance Where Assessed-Upper Body Dressing: Edge of bed Lower Body Dressing: Dependent Where Assessed-Lower Body Dressing: Edge of bed Toileting: Dependent Where Assessed-Toileting: Other (Comment) (Condom cath and incontinent of bowel) Toilet Transfer: Unable to assess Toilet Transfer Method: Unable to assess Gaffer Transfer: Unable to assess Social research officer, government Method: Unable to assess   Therapy/Group: Individual Therapy  Janey Genta 11/03/2022, 7:53 AM

## 2022-11-04 MED ORDER — SENNOSIDES-DOCUSATE SODIUM 8.6-50 MG PO TABS: 1.0000 | ORAL_TABLET | Freq: Every evening | ORAL | Status: DC | PRN

## 2022-11-04 MED ORDER — METOPROLOL TARTRATE 25 MG PO TABS
25.0000 mg | ORAL_TABLET | Freq: Two times a day (BID) | ORAL | Status: DC
  Administered 2022-11-04 – 2022-11-08 (×8): 25 mg via ORAL
  Filled 2022-11-04 (×8): qty 1

## 2022-11-04 NOTE — Patient Care Conference (Signed)
Inpatient RehabilitationTeam Conference and Plan of Care Update Date: 11/04/2022   Time: 10:49 AM    Patient Name: Daniel [name]      Medical Record Number: [id number]  Date of Birth: 1946/06/03 Sex: Male         Room/Bed: 4M04C/4M04C-01 Payor Info: Payor: [employer] MEDICARE / Plan: [employer] MEDICARE CHOICE PPO / Product Type: *No Product type* /    Admit Date/Time:  10/27/2022  5:04 PM  Primary Diagnosis:  Acute ischemic left middle cerebral artery (MCA) stroke [facility])  Hospital Problems: Principal Problem:   Acute ischemic left middle cerebral artery (MCA) stroke [facility])    Expected Discharge Date: Expected Discharge Date: 11/18/22  Team Members Present: Physician leading conference: Dr. Alysia Penna Social Worker Present: Erlene Quan, BSW Nurse Present: Dorien Chihuahua, RN PT Present: Page Spiro, PT OT Present: Other (comment) Lowella Fairy, OT) SLP Present: Sherren Kerns, SLP PPS Coordinator present : Gunnar Fusi, SLP     Current Status/Progress Goal Weekly Team Focus  Bowel/Bladder   Pt incontinenet B/B with some continece at times. LBM 11/03/22   Pt will regain continenece   Toilet pt q2hrs and prn qshit    Swallow/Nutrition/ Hydration   dys 2 diet, thin liquids, min A   sup A  tolerance of current diet with implementation of safe swallowing precautions and strategies, dys 3 trials as appropriate    ADL's   Pt min/mod A bed mobility, min/mod stand pivot transfers to [facility] and shower, Pt completed shower bed level prior to hospitalization, mod UB dressing, max LB dressing, Mod bathing; Pt limited by fatigue, loose bowel movements   Min A for BADLs   BADL retraining, NMR RUE & RLE, custom wc evaluaiton, functional transfers    Mobility   min/mod assist bed mobility, min/mod assist partial stand pivot transfers, min/mod assist wheelchair propulsion using L hemi-technique, participated in gait training in litegait harness up to 32ft with partial BWS -  performed custom manual wheelchair evaluation with loaner chair being provided by [employer]   supervision bed mobility, CGA bed<>chair transfers, supervision wheelchair mobility  activity tolerance, pt education, bed mobility training, transfer training, standing tolerance and balance, wheelchair mobility and part management, custom manaul wheelchair evaluation, pt awareness and motor planning    Communication   mod-to-max A for speech intelligibility which fluctuates with alertness; mod I for word finding   mod I for word finding, min-to-mod A for speech intelligibility   speech intelligibility at the word and phrase level    Safety/Cognition/ Behavioral Observations  mod A - limited by fatigue   sup A   problem solving, memory, awareness    Pain   No verbalization of pain at this time   Pt pain will be <3 on 0-10 pain scale   Assess pain qshift/prn    Skin   Redness to sacrum   Pt will maintain skin intergrity with no further breakdown  Assess skin for breakdown qshift/prn      Discharge Planning:  Discharging home with sister who is able to provide supervision only. Niece able to provide intermittent assistance. In process of arranging family edu with niece. Patient will require assistance.   Team Discussion: Patient with dehydration; nephrology consulted; IVF added.  W/C bound with limited mobility PTA. Progress limited by fatigue, poor carry over post Left MCA CVA.   Patient on target to meet rehab goals: yes, currently needs min - mod assist for stand pivot transfers and min - mod assist for squat pivots to  BSC. Completed bathing at the bed level PTA; using hemi dressing techniques and completes upper body care with mod assist and needs mod - max assist for lower body care. Currently on D2 thin liquid diet due to pocketing a dn anterior spillage without s/s of aspiration but needs supervision due to oral deficits. Will need hlep with medication management and money management  at discharge. Goals for discharge set for min assist overall and CGA for transfers.  *See Care Plan and progress notes for long and short-term goals.   Revisions to Treatment Plan:  W/C eval for custom wheelchair   Teaching Needs: Safety, set up for toileting, medication management, money management, secondary risk management; dietary modifications, transfers, etc.  Current Barriers to Discharge: Home enviroment access/layout, Incontinence, and Lack of/limited family support  Possible Resolutions to Barriers: SW follow up with sister who has a friend who is retired who is potentially able to assist the patient in the home.; he has assisted them in the past.  If he is able, will have him to go head and come in to start observing/training.  SNF access assessment per SW due to home situation [facility] follow up services DME: hospital bed, w/c     Medical Summary Current Status: CKD requiring fluid and e lyte management, incontinent, severe R spasticity  Barriers to Discharge: Electrolyte abnormality;Medical stability;Renal Insufficiency/Failure;Spasticity   Possible Resolutions to [employer] Focus: IVF, Nephro consult, my need IM med for spasticity   Continued Need for Acute Rehabilitation Level of Care: The patient requires daily medical management by a physician with specialized training in physical medicine and rehabilitation for the following reasons: Direction of a multidisciplinary physical rehabilitation program to maximize functional independence : Yes Medical management of patient stability for increased activity during participation in an intensive rehabilitation regime.: Yes Analysis of laboratory values and/or radiology reports with any subsequent need for medication adjustment and/or medical intervention. : Yes   I attest that I was present, lead the team conference, and concur with the assessment and plan of the team.   Dorien Chihuahua B 11/04/2022, 2:18 PM

## 2022-11-04 NOTE — Progress Notes (Signed)
Physical Therapy Weekly Progress Note  Patient Details  Name: Daniel [name] MRN: [id number] Date of Birth: [birth year]  Beginning of progress report period: October 28, 2022 End of progress report period: November 04, 2022  Today's Date: 11/04/2022 PT Individual Time: 0920-1030 PT Individual Time Calculation (min): 70 min   Patient has met 2 of 4 short term goals.  Mr. Selvidge is making slow, steady progress with skilled physical therapy demonstrating increasing independence with functional mobility. He is performing supine<>sit with min/mod assist using bed features, sit<>stands with min assist, and bed<>chair partial stand pivot transfers with min/mod assist. Performed custom manual wheelchair evaluation to allow pt increased independence with functional mobility at D/C. He is performing up to 173f of wheelchair mobility with min assist to initiate forward propulsion and to navigate around obstacles but supervision to continue in straight pathway. Pt will benefit from continued CIR level skilled physical therapy to further advance his independence with functional mobility and will require 24hr assistance at home for his safety.   Patient continues to demonstrate the following deficits muscle weakness, muscle joint tightness, and muscle paralysis, decreased cardiorespiratoy endurance, impaired timing and sequencing, abnormal tone, unbalanced muscle activation, and decreased motor planning, decreased attention to right, decreased initiation, decreased attention, decreased awareness, decreased problem solving, decreased memory, and delayed processing, and decreased sitting balance, decreased standing balance, decreased postural control, hemiplegia, and decreased balance strategies and therefore will continue to benefit from skilled PT intervention to increase functional independence with mobility.  Patient progressing toward long term goals..  Continue plan of care.  PT Short Term Goals Week 1:   PT Short Term Goal 1 (Week 1): Pt will perform supine<>sit with min assist using bed features as needed PT Short Term Goal 1 - Progress (Week 1): Progressing toward goal PT Short Term Goal 2 (Week 1): Pt will perform sit<>stand transfers using LRAD with min assist PT Short Term Goal 2 - Progress (Week 1): Met PT Short Term Goal 3 (Week 1): Pt will perform bed<>wheelchair transfers using LRAD with min assist consistently PT Short Term Goal 3 - Progress (Week 1): Progressing toward goal PT Short Term Goal 4 (Week 1): Pt will perform at least 575fwheelchair mobility with no more than min assist PT Short Term Goal 4 - Progress (Week 1): Met Week 2:  PT Short Term Goal 1 (Week 2): Pt will perform supine<>sit with min assist using bed features as needed PT Short Term Goal 2 (Week 2): Pt will maintain sitting balance EOB with CGA while setting up his wheelchair for transfers PT Short Term Goal 3 (Week 2): Pt will perform bed<>chair transfers using LRAD with min assist consistently PT Short Term Goal 4 (Week 2): Pt will perform at least 502ff wheelchair mobility with CGA  Skilled Therapeutic Interventions/Progress Updates:  Neuromuscular re-education;Ambulation/gait training;Community reintegration;DME/adaptive equipment instruction;Psychosocial support;UE/LE Strength taining/ROM;Wheelchair propulsion/positioning;Balance/vestibular training;Discharge planning;Functional electrical stimulation;Pain management;Skin care/wound management;Therapeutic Activities;UE/LE Coordination activities;Cognitive remediation/compensation;Disease management/prevention;Functional mobility training;Patient/family education;Splinting/orthotics;Therapeutic Exercise;Visual/perceptual remediation/compensation   Pt received supine in bed with nurse present for morning medications. Pt agreeable to therapy session although reports feeling off due to "being off schedule." Pt noted to be incontinent of bladder and bowels - dependent  peri-care at bed level with pt able to roll R using bedrail with min assist to rotate pelvis. Supine>sitting L EOB, HOB slightly elevated and using bedrail, with heavy min assist for R LE management and trunk upright. Sitting EOB, doffed dirty gown and donned clean gown (pt requesting to wear gown  oppposed to clothes) with total assist for time management.  Pt's loaner wheelchair from [employer] in pt's room to trial.  R stand pivot EOB>w/c using L UE support on bedrail with min assist for balance and turning for safety - pt demos improved upright posture and increased R LE step length and control - pt able to use L UE support on bedrail to improve eccentric lowering of hips into chair.  Pt able to perform L LE w/c propulsion to sink with min assist - pt still needs tennis shoes to improve traction to increase independence with this, SW aware and has reached out to family.  Sititng in w/c at sink perform oral care with set-up assist (assist needed for removing toothpaste cap).  L hemi-technique w/c propulsion ~129f towards therapy gym primarily using L LE due to pt having difficulty  coordinating both L UE and LE push-strokes together while avoiding consistent R veering - requires min assist to initiate forward propulsion but then supervision to continue the momentum (continues to have difficulty achieving sufficient grip on floor without shoes).   Therapist adjusted wheelchair leg lengths and L lateral trunk support for improved patient positioning in chair.  At end of session, pt left seated in w/c with needs in reach and R UE supported on half lap tray.    Therapy Documentation Precautions:  Precautions Precautions: Fall, Other (comment) Precaution Comments: acute on chronic R hemiparesis with R UE and LE posturing/contractures Restrictions Weight Bearing Restrictions: No   Pain:  No reports of pain throughout session.   Therapy/Group: Individual Therapy  CTawana Scale, PT, DPT,  NCS, CSRS 11/04/2022, 7:47 AM

## 2022-11-04 NOTE — Progress Notes (Signed)
Speech Language Pathology Weekly Progress and Session Note  Patient Details  Name: Daniel [name] MRN: [id number] Date of Birth: March 26, 1946  Beginning of progress report period: October 28, 2022 End of progress report period: November 04, 2022  Today's Date: 11/04/2022 SLP Individual Time: 1330-1430 SLP Individual Time Calculation (min): 60 min  Short Term Goals: Week 1: SLP Short Term Goal 1 (Week 1): Pt will consume current diet with improved mastication, oral manipulation, minimal oral residuals, and minimal overt s/sx of aspiration with min A cues for implementation of swallowing precautions/strategies SLP Short Term Goal 1 - Progress (Week 1): Met SLP Short Term Goal 2 (Week 1): Patient will implement speech intelligibility strategies at the word and short sentence level with 75% intelligiblity with mod A for implementation of speech intelligibility strategies SLP Short Term Goal 2 - Progress (Week 1): Not met SLP Short Term Goal 3 (Week 1): Patient will recall and demonstrate use of memory compensations to increase independence and safety at home with mod A verbal cues. SLP Short Term Goal 3 - Progress (Week 1): Not met SLP Short Term Goal 4 (Week 1): Patient will demonstrate functional problem solving for basic and familiar tasks with Mod A verbal cues. SLP Short Term Goal 4 - Progress (Week 1): Not met SLP Short Term Goal 5 (Week 1): Pt will recall and demonstrate use of word finding strategies within structured and/or natural contexts with minA verbal cues. SLP Short Term Goal 5 - Progress (Week 1): Met (discontinued for further emphasis on cognitive goals)  New Short Term Goals: Week 2: SLP Short Term Goal 1 (Week 2): Pt will consume current diet with improved mastication, oral manipulation, minimal oral residuals, and minimal overt s/sx of aspiration with sup A cues for implementation of swallowing precautions/strategies SLP Short Term Goal 2 (Week 2): Patient will implement  speech intelligibility strategies at the word and short sentence level with 75% intelligiblity with mod A for implementation of speech intelligibility strategies SLP Short Term Goal 3 (Week 2): Patient will recall and demonstrate use of memory compensations to increase independence and safety at home with mod A verbal cues. SLP Short Term Goal 4 (Week 2): Patient will demonstrate functional problem solving for basic and familiar tasks with Mod A verbal cues.  Weekly Progress Updates: Patient has made minimal progress and has met 2 of 5 STGs this reporting period. Patient is currently completing cognitive tasks with overall max A verbal/visual cues to complete functional and basic cognitive tasks accurately and safely. Pt has been limited by fatigue and severity of deficits with limited carry over. Pt is currently requiring mod A to implement speech strategies at the word level but progress with speech goals fluctuates. Pt is currently consuming a dysphagia 2 diet with thin liquids with min A to perform lingual sweep for clearance of pocketing on right, monitoring anterior loss on right, slow rate, small bites/sips, and achieving optimal positioning. Patient education is ongoing. Family has not yet been able to be present for therapy sessions. Patient would benefit from continued skilled SLP intervention to maximize cognitive functioning and overall functional independence prior to discharge, as well as home health SLP at discharge. Pt will require assistance with all iADLs due to current level of cognitive deficits, such as medication and money management. Anticipate SLP will need to consider downgrading goals due to minimal/slow progress toward LTGs.   Intensity: Minumum of 1-2 x/day, 30 to 90 minutes Frequency: 3 to 5 out of 7 days Duration/Length of  Stay: 12/13 Treatment/Interventions: Cognitive remediation/compensation;Internal/external aids;Oral motor exercises;Speech/Language facilitation;Therapeutic  Exercise;Cueing hierarchy;Dysphagia/aspiration precaution training;Functional tasks;Patient/family education;Therapeutic Activities  Daily Session Skilled Therapeutic Interventions: Skilled ST treatment focused on cognitive-linguistic and speech goals. SLP facilitated session by reviewing speech intelligibility strategies with max A for recall. Pt unable to use external aid to assist with recall due to either visual and/or reading deficits. SLP facilitated use of speech strategies at the word level with mod A verbal cues to over-articulate, separate words, and speak with increased vocal intensity. Pt required max A to separate syllables with 3 syllable words. Without implementation of speech strategies, pt was perceived as ~25% intelligible at the word level, and <25% at the phrase and sentence levels. Pt with minimal to no carry over during non-structured speech tasks. SLP facilitated basic verbal reasoning with overall max A multimodal cues to generate appropriate responses. Pt required mod A for word finding during a structured word generating task through divergent naming, however word finding difficulty is typically not directly observed during simple communication exchange in natural contexts. SLP facilitated problem solving skills through identifying organization errors in BID pillbox. Pt required total A for reading medication label, and total A for identification of errors with eventual inability to understand concept. Pt required mod-to-max A verbal cues for immediate recall and working memory throughout session. At one point in time, pt stated a "cash register lady" could provide assist at discharge however he would need to see if "we would be going to [facility] or not." Question confusion? Notified team. Pt was encouraged to consume fluids throughout session and consumed ~71m during session. Mild anterior spillage noted on right when drinking from bottle. Patient was left in bed with alarm activated  and immediate needs within reach at end of session. Continue per current plan of care.      General    Pain    Therapy/Group: Individual Therapy  BPatty Sermons11/29/2023, 3:43 PM

## 2022-11-04 NOTE — Plan of Care (Signed)
  Problem: RH Expression Communication Goal: LTG Patient will increase speech intelligibility (SLP) Description: LTG: Patient will increase speech intelligibility at word/phrase/conversation level with cues, % of the time (SLP) Flowsheets (Taken 11/04/2022 0800) LTG: Patient will increase speech intelligibility (SLP):  Minimal Assistance - Patient > 75%  Moderate Assistance - Patient 50 - 74% Level: Phrase Percent of time patient will use intelligible speech: 75

## 2022-11-04 NOTE — NC FL2 (Addendum)
[place] LEVEL OF CARE SCREENING TOOL     IDENTIFICATION  Patient Name: Daniel [name] Birthdate: 1946-05-17 Sex: male Admission Date (Current Location): 10/27/2022  [facility] and [place] Number:  Herbalist and Address:         Provider Number:    Attending Physician Name and Address:  Charlett Blake, MD  Relative Name and Phone Number:  [name] [phone]    Current Level of Care: Hospital Recommended Level of Care: [place] Prior Approval Number:    Date Approved/Denied:   PASRR Number: [id number]  Discharge Plan: SNF    Current Diagnoses: Patient Active Problem List   Diagnosis Date Noted   Acute ischemic left middle cerebral artery (MCA) stroke ([place]) 10/27/2022   Acute CVA (cerebrovascular accident) ([place]) 10/20/2022   Subdural hematoma ([place]) 33/82/5053   Metabolic acidosis 97/67/3419   Hypokalemia 10/20/2022   Hypocalcemia 10/20/2022   BPH (benign prostatic hyperplasia) 10/20/2022   Xerosis cutis 04/30/2020   Closed displaced intertrochanteric fracture of right femur ([place]) 12/11/2019   LFT elevation    Acute kidney injury superimposed on chronic kidney disease ([place])    Nicotine dependence with current use 03/29/2018   Poor dentition 01/17/2015   Peripheral vascular disease ([place]) 01/18/2014   Benign hypertension with CKD (chronic kidney disease) stage III ([place]) 01/18/2014   Hyperlipidemia 08/17/2011   Hemiplegia, late effect of cerebrovascular disease ([place]) 10/17/2009   Anemia 10/16/2008   HTN (hypertension) 10/16/2008   Substance abuse in remission ([place]) 10/16/2008   Schizophrenia ([place]) 12/07/1972    Orientation RESPIRATION BLADDER Height & Weight     Place, Time, Self  Normal Incontinent Weight: 141 lb 15.6 oz (64.4 kg) Height:  5\' 6"  (167.6 cm)  BEHAVIORAL SYMPTOMS/MOOD NEUROLOGICAL BOWEL NUTRITION STATUS      Incontinent Diet (Currently on D2 thin liquid diet due to pocketing)  AMBULATORY STATUS  COMMUNICATION OF NEEDS Skin   Extensive Assist Verbally Normal                       Personal Care Assistance Level of Assistance  Bathing, Dressing Bathing Assistance: Limited assistance   Dressing Assistance: Limited assistance     Functional Limitations Info             [place]  PT (By licensed PT), OT (By licensed OT), Speech therapy     PT Frequency: 5x a week OT Frequency: 5x a week     Speech Therapy Frequency: 5x a week      Contractures      Additional Factors Info                  Current Medications (11/04/2022):  This is the current hospital active medication list Current Facility-Administered Medications  Medication Dose Route Frequency Provider Last Rate Last Admin   0.9 %  sodium chloride infusion   Intravenous Continuous Claudia Desanctis, MD 75 mL/hr at 11/03/22 1937 New Bag at 11/03/22 1937   acetaminophen (TYLENOL) tablet 325-650 mg  325-650 mg Oral Q4H PRN Love, Pamela S, PA-C       amLODipine (NORVASC) tablet 10 mg  10 mg Oral Daily Bary Leriche, PA-C   10 mg at 11/04/22 3790   aspirin EC tablet 81 mg  81 mg Oral Daily Bary Leriche, PA-C   81 mg at 11/04/22 2409   atorvastatin (LIPITOR) tablet 40 mg  40 mg Oral Daily Love, Ivan Anchors, PA-C  40 mg at 11/04/22 0926   bisacodyl (DULCOLAX) suppository 10 mg  10 mg Rectal Daily PRN Bary Leriche, PA-C       calcium carbonate (OS-CAL - dosed in mg of elemental calcium) tablet 1,250 mg  1 tablet Oral BID WC Bary Leriche, PA-C   1,250 mg at 11/04/22 1025   diphenhydrAMINE (BENADRYL) 12.5 MG/5ML elixir 12.5-25 mg  12.5-25 mg Oral Q6H PRN Love, Pamela S, PA-C       enoxaparin (LOVENOX) injection 30 mg  30 mg Subcutaneous QHS Love, Pamela S, PA-C   30 mg at 11/03/22 2057   ferrous sulfate tablet 325 mg  325 mg Oral Q breakfast Bary Leriche, [place]   325 mg at 85/27/78 2423   folic acid (FOLVITE) tablet 1 mg  1 mg Oral Daily Bary Leriche, PA-C   1 mg at 11/04/22 5361    gabapentin (NEURONTIN) capsule 100 mg  100 mg Oral QHS Charlett Blake, MD   100 mg at 11/03/22 2057   guaiFENesin-dextromethorphan (ROBITUSSIN DM) 100-10 MG/5ML syrup 5-10 mL  5-10 mL Oral Q6H PRN Reesa Chew S, PA-C       hydrALAZINE (APRESOLINE) tablet 75 mg  75 mg Oral Q8H Claudia Desanctis, MD   75 mg at 11/04/22 1431   magnesium gluconate (MAGONATE) tablet 250 mg  250 mg Oral Daily Bary Leriche, PA-C   250 mg at 11/04/22 4431   metoprolol tartrate (LOPRESSOR) tablet 25 mg  25 mg Oral BID Claudia Desanctis, MD       milk and molasses enema  1 enema Rectal Daily PRN Love, Pamela S, PA-C       nystatin (MYCOSTATIN) 100000 UNIT/ML suspension 500,000 Units  5 mL Oral QID Love, Pamela S, PA-C   500,000 Units at 11/04/22 1247   Oral care mouth rinse  15 mL Mouth Rinse PRN Love, Ivan Anchors, PA-C       Oral care mouth rinse  15 mL Mouth Rinse 4 times per day Charlett Blake, MD   15 mL at 11/04/22 1247   Oral care mouth rinse  15 mL Mouth Rinse PRN Kirsteins, Luanna Salk, MD       prochlorperazine (COMPAZINE) tablet 5-10 mg  5-10 mg Oral Q6H PRN Love, Pamela S, PA-C       Or   prochlorperazine (COMPAZINE) injection 5-10 mg  5-10 mg Intramuscular Q6H PRN Love, Pamela S, PA-C       Or   prochlorperazine (COMPAZINE) suppository 12.5 mg  12.5 mg Rectal Q6H PRN Love, Pamela S, PA-C       senna-docusate (Senokot-S) tablet 1 tablet  1 tablet Oral QHS PRN Kirsteins, Luanna Salk, MD       simethicone [facility]) chewable tablet 80 mg  80 mg Oral QID PRN Love, Pamela S, PA-C       sodium bicarbonate tablet 650 mg  650 mg Oral TID Love, Pamela S, PA-C   650 mg at 11/04/22 1431   tamsulosin (FLOMAX) capsule 0.4 mg  0.4 mg Oral Daily Bary Leriche, PA-C   0.4 mg at 11/04/22 5400   traZODone (DESYREL) tablet 25-50 mg  25-50 mg Oral QHS PRN Bary Leriche, PA-C   50 mg at 10/27/22 2014   Vitamin D (Ergocalciferol) (DRISDOL) 1.25 MG (50000 UNIT) capsule 50,000 Units  50,000 Units Oral Q7 days Bary Leriche, [place]    50,000 Units at 11/04/22 8676     Discharge Medications: Please see  discharge summary for a list of discharge medications.  Relevant Imaging Results:  Relevant Lab Results:   Additional Information [id number]  [name]

## 2022-11-04 NOTE — Progress Notes (Signed)
[facility] NEPHROLOGY PROGRESS NOTE  Assessment/ Plan: Pt is a 76 y.o. yo male  with past medical history significant for hypertension, HLD, BPH, prior stroke with residual right hemiparesis, CKD 3B, presented with right sided facial droop and slurred speech, seen as a consultation for the evaluation of acute kidney injury on CKD.   #Acute kidney injury on CKD stage IV: cr around 2.2-2.7 in 2021, no recent baseline creatinine level available.  Kidney ultrasound with chronic finding and volume loss.  This is likely acute kidney injury due to hemodynamically mediated in the setting of decreased oral intake, use of ACE inhibitor and UTI.   S/p supportive care with IV fluids.   - he will need renal follow-up upon discharge   - Getting NS x 24 hours as below - Renal panel in am  - on flomax with hx urinary retention per primary team note  - I have re-ordered strict ins/outs and reinforced the importance of same via care order  CKD stage IV - Cr 2.2 - 2.7 in 2021   #Acute left MCA ischemic stroke/acute bilateral SDH: No intervention per neurosurgeon.   s/p Neurology eval.  Per NS, only aspirin for anticoagulation.   #Hypertension: Improved most recently but still above goal.  Have increased hydralazine to TID.  Start metoprolol tartrate to 25 mg BID  #Metabolic acidosis due to renal failure: Improved, on oral sodium bicarbonate.  #History of BPH: On Flomax.  US renal without hydronephrosis.   #Hypokalemia: improved  #Hypocalcemia/hypophosphatemia: Phosphate normal now; Calcium improving.   # Anemia due to CKD: will give IV iron x 2 doses. Defer ESA in the setting of recent stroke   Disposition - per rehab team    ----------------------   Subjective:   He got NS at 75 ml/hr x 24 hours starting yesterday.  Strict ins/outs are still not available.  He had 875 ml uop as well as 2 unmeasured voids over 11/27 recorded.  Spoke with speech therapist who is at bedside - she  states his diet has been downgraded due to pocketing food, worsening difficulty with swallowing.  Pt and staff state that his sister would help him with medical decisions.    Review of systems: Denies n/v Reports he is eating but not much No shortness of breath or chest pain   Objective Vital signs in last 24 hours: Vitals:   11/03/22 1954 11/04/22 0559 11/04/22 0603 11/04/22 0604  BP: (!) 145/75  (!) 171/81 (!) 171/81  Pulse: 91   95  Resp: 16   16  Temp: 99.8 F (37.7 C)   99 F (37.2 C)  TempSrc: Oral   Oral  SpO2: 94%   95%  Weight:  64.4 kg    Height:       Weight change: -2.7 kg  Intake/Output Summary (Last 24 hours) at 11/04/2022 1400 Last data filed at 11/04/2022 0750 Gross per 24 hour  Intake 595 ml  Output 600 ml  Net -5 ml       Labs: RENAL PANEL Recent Labs    10/20/22 1453 10/20/22 1501 10/21/22 1456 10/22/22 0427 10/23/22 0324 10/24/22 0405 10/25/22 0415 10/26/22 0348 10/27/22 0844 10/28/22 0549 10/29/22 0452 10/30/22 0552 10/31/22 0656 11/01/22 0704 11/02/22 0552 11/03/22 0659  NA 137   < >  --  138 141 141 141 140 139 140 140 141 138  138 139 137  K 2.9*   < >  --  3.0* 4.4 4.7 4.5 4.6 4.3 4.6 4.4 4.2  4.2 4.2 4.2 4.3  CL 105   < >  --  111 112* 107 107 108 108 110 113* 111 110 109 105 105  CO2 19*   < >  --  19* 22 22 22  21* 21* 21* 21* 20* 19* 19* 20* 20*  GLUCOSE 108*   < >  --  104* 93 97 99 92 96 94 94 94 91 95  93 95  BUN 41*   < >  --  40* 38* 36* 33* 31* 27* 28* 24* 21 23 24* 26* 25*  CREATININE 5.85*   < >  --  5.16* 4.72* 4.65* 4.38* 4.38* 4.27* 4.53* 4.30* 4.26* 4.48* 4.54* 4.69* 4.70*  CALCIUM 7.1*   < >  --  6.4* 6.6* 7.1* 7.4* 7.8* 8.1* 8.1* 7.7* 7.7* 7.9* 7.9* 7.9* 7.9*  MG  --   --  1.5*  --  1.6*  --  1.6* 2.1  --  1.8  --   --   --   --  1.6*  --   PHOS  --   --   --   --  1.3* 2.3* 2.9 3.1 3.2  --   --   --   --   --   --  4.1  ALBUMIN 3.7  --   --  2.7* 2.6* 2.9* 2.6* 2.6* 2.7* 2.6*  --   --   --   --   --  3.0*   < > =  values in this interval not displayed.     Liver Function Tests: Recent Labs  Lab 11/03/22 0659  ALBUMIN 3.0*   No results for input(s): "LIPASE", "AMYLASE" in the last 168 hours. No results for input(s): "AMMONIA" in the last 168 hours. CBC: Recent Labs    10/22/22 0427 10/23/22 0318 10/23/22 0324 10/25/22 0415 10/28/22 0549 11/02/22 0552  HGB 8.6* 8.5*  --  8.5* 8.6* 8.5*  MCV 86.8 85.3  --  89.4 89.4 86.6  VITAMINB12  --   --  375  --   --   --   FOLATE  --   --  8.6  --   --   --   FERRITIN  --   --  94  --   --   --   TIBC  --   --  189*  --   --   --   IRON  --   --  18*  --   --   --   RETICCTPCT  --   --  0.8  --   --   --     Cardiac Enzymes: No results for input(s): "CKTOTAL", "CKMB", "CKMBINDEX", "TROPONINI" in the last 168 hours. CBG: No results for input(s): "GLUCAP" in the last 168 hours.  Iron Studies: No results for input(s): "IRON", "TIBC", "TRANSFERRIN", "FERRITIN" in the last 72 hours. Studies/Results: No results found.  Medications: Infusions:  sodium chloride 75 mL/hr at 11/03/22 1937     Scheduled Medications:  amLODipine  10 mg Oral Daily   aspirin EC  81 mg Oral Daily   atorvastatin  40 mg Oral Daily   calcium carbonate  1 tablet Oral BID WC   enoxaparin (LOVENOX) injection  30 mg Subcutaneous QHS   ferrous sulfate  325 mg Oral Q breakfast   folic acid  1 mg Oral Daily   gabapentin  100 mg Oral QHS   hydrALAZINE  75 mg Oral Q8H   magnesium gluconate  250 mg Oral  Daily   nystatin  5 mL Oral QID   mouth rinse  15 mL Mouth Rinse 4 times per day   sodium bicarbonate  650 mg Oral TID   tamsulosin  0.4 mg Oral Daily   Vitamin D (Ergocalciferol)  50,000 Units Oral Q7 days    have reviewed scheduled and prn medications.  Physical Exam:     General elderly male in bed in no acute distress HEENT facies c/w prior stroke, assymetric Neck supple trachea midline Lungs clear to auscultation bilaterally normal work of breathing at rest   Heart S1S2 no rub Abdomen soft nontender nondistended Extremities no edema  Psych no anxiety or agitation; somewhat flat affect  Neuro - awake on arrival; provides limited history   Claudia Desanctis, MD 11/04/2022,2:15 PM  LOS: 8 days

## 2022-11-04 NOTE — Progress Notes (Addendum)
Patient ID: EDISON NICHOLSON, male   DOB: November 19, 1946, 76 y.o.   MRN: [id number]  Team Conference Report to Patient/Family  Team Conference discussion was reviewed with the patient and caregiver, including goals, any changes in plan of care and target discharge date.  Patient and caregiver express understanding and are in agreement.  The patient has a target discharge date of 11/18/22.  SW spoke with patient sister, [name] via telephone and provided team conference updates. Patient will require physical assistance at discharge and sister will be unable to provide. Patient niece works during the day and is only able to provide intermittent assist. Sister expressed that she has a friend who is retired who can potentially assist at discharge. Sister will attempt to contact friend, Mare Ferrari and follow up with SW.   Sister reports the patient currently has a lightweight WC and will send SW a picture when she has a chance. Sister has agreed to patient over patient cognitive tasks such as medications and money/bill management. Sister shares that patient did not wear shoes prior and he does not have any to bring to IP. NO additional questions or concerns.  SW will attempt SNF authorization.   Patient niece contact information: [phone]  [name] 11/04/2022, 2:17 PM

## 2022-11-04 NOTE — Progress Notes (Signed)
PROGRESS NOTE   Subjective/Complaints:  No tingling in feet at noc. Appreciate renal note   ROS:  Pt denies SOB, abd pain, CP, N/V/C/D, and vision changes   Objective:   No results found. Recent Labs    11/02/22 0552  WBC 4.7  HGB 8.5*  HCT 24.6*  PLT 304    Recent Labs    11/02/22 0552 11/03/22 0659  NA 139 137  K 4.2 4.3  CL 105 105  CO2 20* 20*  GLUCOSE 93 95  BUN 26* 25*  CREATININE 4.69* 4.70*  CALCIUM 7.9* 7.9*     Intake/Output Summary (Last 24 hours) at 11/04/2022 0748 Last data filed at 11/04/2022 0557 Gross per 24 hour  Intake 891.53 ml  Output 875 ml  Net 16.53 ml         Physical Exam: Vital Signs Blood pressure (!) 171/81, pulse 95, temperature 99 F (37.2 C), temperature source Oral, resp. rate 16, height _0  (1.676 m), weight 64.4 kg, SpO2 95 %.   General: No acute distress Mood and affect are appropriate Heart: Regular rate and rhythm no rubs murmurs or extra sounds Lungs: Clear to auscultation, breathing unlabored, no rales or wheezes Abdomen: Positive bowel sounds, soft nontender to palpation, nondistended Extremities: No clubbing, cyanosis, or edema Skin: No evidence of breakdown, no evidence of rash   Skin: Skin is warm and dry. +RUE IV  Neuro: + Mild dysarthria, as well as some espressive aphasia- sentence level - stable. AAOx3. Follows all commands.     PE from prior encounter:  RUE- proximal 3/5; distal 2/5 LUE 5-/5 in same muscles RLE- HF 3/5; DF/PF 2/5- pt couldn't understand to test KE/KF LLE- 5-/5 in same muscles- KE/KF NT   Tone increased MAS 3 in R elbow and knee flexors, mainly brachioradialis and medial hamstrings     Assessment/Plan: 1. Functional deficits which require 3+ hours per day of interdisciplinary therapy in a comprehensive inpatient rehab setting. Physiatrist is providing close team supervision and 24 hour management of active medical  problems listed below. Physiatrist and rehab team continue to assess barriers to discharge/monitor patient progress toward functional and medical goals  Care Tool:  Bathing    Body parts bathed by patient: Chest, Abdomen, Front perineal area, Buttocks, Right upper leg, Left upper leg, Right lower leg, Left lower leg, Face   Body parts bathed by helper: Right arm, Left arm     Bathing assist Assist Level: Set up assist     Upper Body Dressing/Undressing Upper body dressing   What is the patient wearing?: Pull over shirt, Hospital gown only    Upper body assist Assist Level: Moderate Assistance - Patient 50 - 74%    Lower Body Dressing/Undressing Lower body dressing      What is the patient wearing?: Incontinence brief     Lower body assist Assist for lower body dressing: Maximal Assistance - Patient 25 - 49%     Toileting Toileting    Toileting assist Assist for toileting: Dependent - Patient 0%     Transfers Chair/bed transfer  Transfers assist  Chair/bed transfer activity did not occur: Safety/medical concerns  Chair/bed transfer assist level: Moderate  Assistance - Patient 50 - 74% (partial stand pivot) Chair/bed transfer assistive device: Armrests, Bedrails   Locomotion Ambulation   Ambulation assist   Ambulation activity did not occur: N/A (pt has not ambulated in 3 years, uses w/c for mobility baseline)          Walk 10 feet activity   Assist           Walk 50 feet activity   Assist           Walk 150 feet activity   Assist           Walk 10 feet on uneven surface  activity   Assist Walk 10 feet on uneven surfaces activity did not occur: N/A         Wheelchair     Assist Is the patient using a wheelchair?: Yes Type of Wheelchair: Manual    Wheelchair assist level: Moderate Assistance - Patient 50 - 74% Max wheelchair distance: 139f    Wheelchair 50 feet with 2 turns activity    Assist         Assist Level: Maximal Assistance - Patient 25 - 49%   Wheelchair 150 feet activity     Assist      Assist Level: Maximal Assistance - Patient 25 - 49%   Blood pressure (!) 171/81, pulse 95, temperature 99 F (37.2 C), temperature source Oral, resp. rate 16, height _0  (1.676 m), weight 64.4 kg, SpO2 95 %.  Medical Problem List and Plan: 1. Functional deficits secondary to L MCA stroke with R sided weakness in setting of prior R hemiparesis Spasticity R elbow and knee flexors will consider  botox             -patient may  shower             -ELOS/Goals: 12- 14 days supervision Team conference today please see physician documentation under team conference tab, met with team  to discuss problems,progress, and goals. Formulized individual treatment plan based on medical history, underlying problem and comorbidities.   -con't CIR-PT, OT and SLP   2.  Antithrombotics: -DVT/anticoagulation:  Pharmaceutical: Lovenox             -antiplatelet therapy: ASA 3. Pain Management: Tylenol prn.  Bilateral foot pain worse at night , no hx of DM but sx and exam most consistent with neuropathic pain , will trial gabapentin low dose  4. Mood/Behavior/Sleep: LCSW to follow for evaluation and support.              --trazodone prn for insomnia.              -antipsychotic agents: N/A  5. Neuropsych/cognition: This patient is capable of making decisions on his own behalf. 6. Skin/Wound Care: Routine pressure relief measures. And skin /wound care due to incontinence 7. Fluids/Electrolytes/Nutrition: Monitor I/O. Check CMET in am.             --continue IVF and bicarb for now - remains on maintenance 75 cc/hr - DC maintenance, encourage PO 11/23; goal to stabilize to remove PO bicarb and re-initiate ACE - appreciate Nephro note will ask f/u question on those issues   8. HTN: Monitor BP TID--continues to be labile. On hydralazine and amlodipine             --off ace due to acute on chronic renal  failure  11/22- BP a little elevated- will monitor for trend and then change meds as required  11/04/2022    6:04 AM 11/04/2022    6:03 AM 11/04/2022    5:59 AM  Vitals with BMI  Weight   142 lbs  BMI   76.16  Systolic 073 710   Diastolic 81 81   Pulse 95                 11/23: Consistently 150-170 SBP; increase Hydralazine to 50 mg BID              11/24: HTN remains uncontrolled. Cr and BUN has remained stable last several checks; DC IVF, encourage PO, if stable over 1-2 days will re-initiate ACE               11/25: HTN ongoing - hydralazine to 75 mg BID  9. Acute on chronic renal failure (baseline 2.0-2.3 2021): Continue IVF. Strict I/O.  --SCr down from 6.4-->4.27-> 4.5->4.3   11/22- Cr up somewhat to 4.53 from 4.27- had been better last 3 days, however pt says not drinking well- will push fluids and recheck in AM              11/23: 1,500 IVF yesterday, PO 480 cc PO yesterday, Cr 4.3 today. Continue on PO and repeat in AM.               11/24: PO remains poor, patient remains on 75 cc/hr maintenance IVF, Cr and BUN stable. DC IVF today and recheck next 3 days.               11/25: Encourage PO one additional day; initiate timed toiletting so patient does not avoid fluids d/t incontinence. Labs in AM.     Latest Ref Rng & Units 11/03/2022    6:59 AM 11/02/2022    5:52 AM 11/01/2022    7:04 AM  BMP  Glucose 70 - 99 mg/dL 95  93  95   BUN 8 - 23 mg/dL _0 Creatinine 0.61 - 1.24 mg/dL 4.70  4.69  4.54   Sodium 135 - 145 mmol/L 137  139  138   Potassium 3.5 - 5.1 mmol/L 4.3  4.2  4.2   Chloride 98 - 111 mmol/L 105  105  109   CO2 22 - 32 mmol/L _1 Calcium 8.9 - 10.3 mg/dL 7.9  7.9  7.9   See nephro note   9. E coli UTI: completed abx             --monitor voiding with PVR/Bladder scan 10. Anemia fo chronic disease: Drop in Hgb due to hydration             --continue iron supplement.  11. Vitamin D deficiency: Started on ergocalciferol on 11/15 12.   Hypocalcemia: Ca 8.1 w/ alb2.7-->continue supplement as corrects to almost normal.               -- Ca stable 8.1-7.7 11/23  13. Questionable Schizophrenia/Psych Hx: CIR note from 2010 reviewed.  14. UTI/PNA- on Ancef- finishing IV ABX 15. Incontinent of bowel and bladder- has condom cath and using brief-will monitor skin closely.              - LBM 11/25            - 11/25: Timed toiletting Q4H ordered for urinary incontinence; hesitant to DC flomax d/t prior retention  16. Low Mg  11/22- Up to 2.1- con't to monitor weekly and replete as  required- appreciate nephro assist       LOS: 8 days A FACE TO FACE EVALUATION WAS PERFORMED  Charlett Blake 11/04/2022, 7:48 AM

## 2022-11-04 NOTE — Progress Notes (Signed)
Occupational Therapy Session Note  Patient Details  Name: Daniel [name] MRN: [id number] Date of Birth: May 12, 1946  Today's Date: 11/04/2022 OT Individual Time: 3785-8850 OT Individual Time Calculation (min): 70 min    Short Term Goals: Week 1:  OT Short Term Goal 1 (Week 1): Pt will maintian sitting balance min A for 3 minutes OT Short Term Goal 2 (Week 1): Pt will perform LB dressing mod A OT Short Term Goal 3 (Week 1): Pt will perform grooming mod A OT Short Term Goal 4 (Week 1): Further assess vision in functional context  Skilled Therapeutic Interventions/Progress Updates:    Pt resting in w/c upon arrival. OT intervention with focus on sit<>stand, standing balance, functional transfers, and RUE PROM. Sit<>stand at sink with min A. Standing balance at sink for grooming tasks with CGA/min A and min verbal cues for improved standing posture. RUE elbow and shoulder PROM to maintain joint mobility and as preventative for contractures. Stand pivot transfer to EOB with min A. Pt required mod A for BLE mgmt for sit>supine in bed. Min A to reposition in bed. Pt able to assist with pulling up with LUE on bed frame. Pt remained in bed with all needs within reach. Bed alarm activated.   Therapy Documentation Precautions:  Precautions Precautions: Fall, Other (comment) Precaution Comments: acute on chronic R hemiparesis with R UE and LE posturing/contractures Restrictions Weight Bearing Restrictions: No  Pain:  Pt denies pain this morning   Therapy/Group: Individual Therapy  Leroy Libman 11/04/2022, 11:55 AM

## 2022-11-05 LAB — RENAL FUNCTION PANEL
Albumin: 3 g/dL — ABNORMAL LOW (ref 3.5–5.0)
Anion gap: 12 (ref 5–15)
BUN: 26 mg/dL — ABNORMAL HIGH (ref 8–23)
CO2: 20 mmol/L — ABNORMAL LOW (ref 22–32)
Calcium: 8.2 mg/dL — ABNORMAL LOW (ref 8.9–10.3)
Chloride: 107 mmol/L (ref 98–111)
Creatinine, Ser: 4.83 mg/dL — ABNORMAL HIGH (ref 0.61–1.24)
GFR, Estimated: 12 mL/min — ABNORMAL LOW (ref >60)
Glucose, Bld: 97 mg/dL (ref 70–99)
Phosphorus: 3.8 mg/dL (ref 2.5–4.6)
Potassium: 4.3 mmol/L (ref 3.5–5.1)
Sodium: 139 mmol/L (ref 135–145)

## 2022-11-05 MED ORDER — SODIUM CHLORIDE 0.9 % IV SOLN: INTRAVENOUS | Status: AC

## 2022-11-05 NOTE — Progress Notes (Signed)
[facility] NEPHROLOGY PROGRESS NOTE  Assessment/ Plan: Pt is a 76 y.o. yo male  with past medical history significant for hypertension, HLD, BPH, prior stroke with residual right hemiparesis, CKD 3B, presented with right sided facial droop and slurred speech, seen as a consultation for the evaluation of acute kidney injury on CKD.   #Acute kidney injury on CKD stage IV: cr around 2.2-2.7 in 2021, no recent baseline creatinine level available.  Kidney ultrasound with chronic finding and volume loss.  This is likely acute kidney injury due to hemodynamically mediated in the setting of decreased oral intake, use of ACE inhibitor and UTI.   S/p supportive care with IV fluids.   - Renal panel on 12/2 - NS at 75 ml/hr x 24 hours  - on flomax with hx urinary retention per primary team note  - Strict ins/outs - incontinence complicates  - Kidney function has continued to slightly worsen over time.  Advanced CKD.  - His sister shares that he would want conservative kidney management - she shares that she doesn't think he would want dialysis if it were needed.  We will continue with supportive care.  He is also a medically poor candidate for dialysis   # CKD stage IV - Cr 2.2 - 2.7 in 2021   #Acute left MCA ischemic stroke/acute bilateral SDH: No intervention per neurosurgeon.   s/p Neurology eval.  Per NS, only aspirin for anticoagulation.   #Hypertension: Improved most recently but still above goal.  Have increased hydralazine to TID.  started metoprolol tartrate 25 mg BID  #Metabolic acidosis due to renal failure: Improved, on oral sodium bicarbonate.  #History of BPH: On Flomax.  US renal without hydronephrosis.   #Hypokalemia: improved  #Hypocalcemia/hypophosphatemia: Phosphate normal now; Calcium improving.   # Anemia due to CKD: IV iron x 2 doses. Defer ESA in the setting of recent stroke   Disposition - per rehab team.  As above, conservative kidney management for  advanced CKD     ----------------------   Subjective:   Strict ins/outs are still not available he had 525 mL and 5 unmeasured voids over 11/29.  Pt and staff state that his sister would help him with medical decisions. Staff indicate that he has been confused at times.  I called his sister and discussed his kidney function.   She shares that she does not think that he would want dialysis   Review of systems: Denies n/v  dysphagia No shortness of breath or chest pain   Objective Vital signs in last 24 hours: Vitals:   11/04/22 1431 11/04/22 1955 11/05/22 0521 11/05/22 1322  BP: 137/63 (!) 143/66 (!) 151/70 128/75  Pulse: (!) 101 82 87 85  Resp: 18 20 20 16   Temp: 98.8 F (37.1 C) 99.8 F (37.7 C) 99.7 F (37.6 C) 99.6 F (37.6 C)  TempSrc: Oral Oral Oral Oral  SpO2: 97% 97% 99% 100%  Weight:   68.2 kg   Height:       Weight change: 3.8 kg  Intake/Output Summary (Last 24 hours) at 11/05/2022 1451 Last data filed at 11/05/2022 1323 Gross per 24 hour  Intake 1814.17 ml  Output 725 ml  Net 1089.17 ml       Labs: RENAL PANEL Recent Labs    10/20/22 1453 10/20/22 1501 10/21/22 1456 10/22/22 0427 10/23/22 0324 10/24/22 0405 10/25/22 0415 10/26/22 0348 10/27/22 5956 10/28/22 0549 10/29/22 3875 10/30/22 6433 10/31/22 2951 11/01/22 8841 11/02/22 6606 11/03/22 3016 11/05/22 0715  NA 137   < >  --  138 141 141 141 140 139 140 140 141 138  138 139 137 139  K 2.9*   < >  --  3.0* 4.4 4.7 4.5 4.6 4.3 4.6 4.4 4.2 4.2 4.2 4.2 4.3 4.3  CL 105   < >  --  111 112* 107 107 108 108 110 113* 111 110 109 105 105 107  CO2 19*   < >  --  19* 22 22 22  21* 21* 21* 21* 20* 19* 19* 20* 20* 20*  GLUCOSE 108*   < >  --  104* 93 97 99 92 96 94 94 94 91 95  93 95 97  BUN 41*   < >  --  40* 38* 36* 33* 31* 27* 28* 24* 21 23 24* 26* 25* 26*  CREATININE 5.85*   < >  --  5.16* 4.72* 4.65* 4.38* 4.38* 4.27* 4.53* 4.30* 4.26* 4.48* 4.54* 4.69* 4.70* 4.83*  CALCIUM 7.1*   < >  --  6.4*  6.6* 7.1* 7.4* 7.8* 8.1* 8.1* 7.7* 7.7* 7.9* 7.9* 7.9* 7.9* 8.2*  MG  --   --  1.5*  --  1.6*  --  1.6* 2.1  --  1.8  --   --   --   --  1.6*  --   --   PHOS  --   --   --   --  1.3* 2.3* 2.9 3.1 3.2  --   --   --   --   --   --  4.1 3.8  ALBUMIN 3.7  --   --  2.7* 2.6* 2.9* 2.6* 2.6* 2.7* 2.6*  --   --   --   --   --  3.0* 3.0*   < > = values in this interval not displayed.     Liver Function Tests: Recent Labs  Lab 11/03/22 0659 11/05/22 0715  ALBUMIN 3.0* 3.0*   No results for input(s): "LIPASE", "AMYLASE" in the last 168 hours. No results for input(s): "AMMONIA" in the last 168 hours. CBC: Recent Labs    10/22/22 0427 10/23/22 0318 10/23/22 0324 10/25/22 0415 10/28/22 0549 11/02/22 0552  HGB 8.6* 8.5*  --  8.5* 8.6* 8.5*  MCV 86.8 85.3  --  89.4 89.4 86.6  VITAMINB12  --   --  375  --   --   --   FOLATE  --   --  8.6  --   --   --   FERRITIN  --   --  94  --   --   --   TIBC  --   --  189*  --   --   --   IRON  --   --  18*  --   --   --   RETICCTPCT  --   --  0.8  --   --   --     Cardiac Enzymes: No results for input(s): "CKTOTAL", "CKMB", "CKMBINDEX", "TROPONINI" in the last 168 hours. CBG: No results for input(s): "GLUCAP" in the last 168 hours.  Iron Studies: No results for input(s): "IRON", "TIBC", "TRANSFERRIN", "FERRITIN" in the last 72 hours. Studies/Results: No results found.  Medications: Infusions:     Scheduled Medications:  amLODipine  10 mg Oral Daily   aspirin EC  81 mg Oral Daily   atorvastatin  40 mg Oral Daily   calcium carbonate  1 tablet Oral BID WC   enoxaparin (LOVENOX)  injection  30 mg Subcutaneous QHS   ferrous sulfate  325 mg Oral Q breakfast   folic acid  1 mg Oral Daily   gabapentin  100 mg Oral QHS   hydrALAZINE  75 mg Oral Q8H   magnesium gluconate  250 mg Oral Daily   metoprolol tartrate  25 mg Oral BID   nystatin  5 mL Oral QID   mouth rinse  15 mL Mouth Rinse 4 times per day   sodium bicarbonate  650 mg Oral TID    tamsulosin  0.4 mg Oral Daily   Vitamin D (Ergocalciferol)  50,000 Units Oral Q7 days    have reviewed scheduled and prn medications.  Physical Exam:     General elderly male in bed in no acute distress HEENT facies c/w prior stroke, assymetric Neck supple trachea midline Lungs clear to auscultation bilaterally normal work of breathing at rest  Heart S1S2 no rub Abdomen soft nontender nondistended Extremities no edema  Psych no anxiety or agitation; somewhat flat affect  Neuro - awake on arrival; provides limited history   Claudia Desanctis, MD 11/05/2022 3:19 PM  LOS: 9 days

## 2022-11-05 NOTE — Progress Notes (Addendum)
PROGRESS NOTE   Subjective/Complaints:   Appreciate renal note  Having RLE spasms at night but tingling in feet improved  ROS:  Pt denies SOB, abd pain, CP, N/V/C/D, LBM 11/29   Objective:   No results found. No results for input(s): "WBC", "HGB", "HCT", "PLT" in the last 72 hours.  Recent Labs    11/03/22 0659  NA 137  K 4.3  CL 105  CO2 20*  GLUCOSE 95  BUN 25*  CREATININE 4.70*  CALCIUM 7.9*     Intake/Output Summary (Last 24 hours) at 11/05/2022 0714 Last data filed at 11/05/2022 0005 Gross per 24 hour  Intake 1509.17 ml  Output 525 ml  Net 984.17 ml         Physical Exam: Vital Signs Blood pressure (!) 151/70, pulse 87, temperature 99.7 F (37.6 C), temperature source Oral, resp. rate 20, height 5\' 6"  (1.676 m), weight 68.2 kg, SpO2 99 %.   General: No acute distress Mood and affect are appropriate Heart: Regular rate and rhythm no rubs murmurs or extra sounds Lungs: Clear to auscultation, breathing unlabored, no rales or wheezes Abdomen: Positive bowel sounds, soft nontender to palpation, nondistended Extremities: No clubbing, cyanosis, or edema Skin: No evidence of breakdown, no evidence of rash   Skin: Skin is warm and dry. +RUE IV  Neuro: + Mild dysarthria, as well as some espressive aphasia- sentence level - stable. AAOx3. Follows all commands.     PE from prior encounter:  RUE- proximal 3/5; distal 2/5 LUE 5-/5 in same muscles RLE- HF 3/5; DF/PF 2/5- pt couldn't understand to test KE/KF LLE- 5-/5 in same muscles- KE/KF NT   Tone increased MAS 3 in R elbow and knee flexors, mainly brachioradialis and medial hamstrings     Assessment/Plan: 1. Functional deficits which require 3+ hours per day of interdisciplinary therapy in a comprehensive inpatient rehab setting. Physiatrist is providing close team supervision and 24 hour management of active medical problems listed  below. Physiatrist and rehab team continue to assess barriers to discharge/monitor patient progress toward functional and medical goals  Care Tool:  Bathing    Body parts bathed by patient: Chest, Abdomen, Front perineal area, Buttocks, Right upper leg, Left upper leg, Right lower leg, Left lower leg, Face   Body parts bathed by helper: Right arm, Left arm     Bathing assist Assist Level: Set up assist     Upper Body Dressing/Undressing Upper body dressing   What is the patient wearing?: Pull over shirt, Hospital gown only    Upper body assist Assist Level: Moderate Assistance - Patient 50 - 74%    Lower Body Dressing/Undressing Lower body dressing      What is the patient wearing?: Incontinence brief     Lower body assist Assist for lower body dressing: Maximal Assistance - Patient 25 - 49%     Toileting Toileting    Toileting assist Assist for toileting: Dependent - Patient 0%     Transfers Chair/bed transfer  Transfers assist  Chair/bed transfer activity did not occur: Safety/medical concerns  Chair/bed transfer assist level: Moderate Assistance - Patient 50 - 74% (partial stand pivot) Chair/bed transfer assistive device: Armrests,  Bedrails   Locomotion Ambulation   Ambulation assist   Ambulation activity did not occur: N/A (pt has not ambulated in 3 years, uses w/c for mobility baseline)          Walk 10 feet activity   Assist           Walk 50 feet activity   Assist           Walk 150 feet activity   Assist           Walk 10 feet on uneven surface  activity   Assist Walk 10 feet on uneven surfaces activity did not occur: N/A         Wheelchair     Assist Is the patient using a wheelchair?: Yes Type of Wheelchair: Manual    Wheelchair assist level: Moderate Assistance - Patient 50 - 74% Max wheelchair distance: 170ft    Wheelchair 50 feet with 2 turns activity    Assist        Assist Level:  Maximal Assistance - Patient 25 - 49%   Wheelchair 150 feet activity     Assist      Assist Level: Maximal Assistance - Patient 25 - 49%   Blood pressure (!) 151/70, pulse 87, temperature 99.7 F (37.6 C), temperature source Oral, resp. rate 20, height 5\' 6"  (1.676 m), weight 68.2 kg, SpO2 99 %.  Medical Problem List and Plan: 1. Functional deficits secondary to L MCA stroke with R sided weakness in setting of prior R hemiparesis Spasticity R elbow and knee flexors will consider  botox             -patient may  shower             -ELOS/Goals: 12- 14 days supervision Would benefit from short term SNF placement , was Mod I WC level at home PTA , slowly improving from mild worsening mobility post recurrent L MCA infarct , dysphagia also worsened from new CVA with reduced intake po    -con't CIR-PT, OT and SLP   2.  Antithrombotics: -DVT/anticoagulation:  Pharmaceutical: Lovenox             -antiplatelet therapy: ASA 3. Pain Management: Tylenol prn.  Bilateral foot pain worse at night , no hx of DM but sx and exam most consistent with neuropathic pain , will trial gabapentin low dose  4. Mood/Behavior/Sleep: LCSW to follow for evaluation and support.              --trazodone prn for insomnia.              -antipsychotic agents: N/A  5. Neuropsych/cognition: This patient is capable of making decisions on his own behalf. 6. Skin/Wound Care: Routine pressure relief measures. And skin /wound care due to incontinence 7. Fluids/Electrolytes/Nutrition: Monitor I/O. Check CMET in am.             --continue IVF and bicarb for now - remains on maintenance 75 cc/hr - DC maintenance, encourage PO 11/23; goal to stabilize to remove PO bicarb and re-initiate ACE - appreciate Nephro note will ask f/u question on those issues   8. HTN: Monitor BP TID--continues to be labile. On hydralazine and amlodipine             --off ace due to acute on chronic renal failure  11/22- BP a little elevated-  will monitor for trend and then change meds as required    11/05/2022    5:21 AM  11/04/2022    7:55 PM 11/04/2022    2:31 PM  Vitals with BMI  Weight 150 lbs 6 oz    BMI 74.94    Systolic 496 759 163  Diastolic 70 66 63  Pulse 87 82 101               11/23: Consistently 150-170 SBP; increase Hydralazine to 50 mg BID              11/24: HTN remains uncontrolled. Cr and BUN has remained stable last several checks; DC IVF, encourage PO, if stable over 1-2 days will re-initiate ACE               11/25: HTN ongoing - hydralazine to 75 mg BID  9. Acute on chronic renal failure (baseline 2.0-2.3 2021): Continue IVF. Strict I/O.  --SCr down from 6.4-->4.27-> 4.5->4.3   11/22- Cr up somewhat to 4.53 from 4.27- had been better last 3 days, however pt says not drinking well- will push fluids and recheck in AM              11/23: 1,500 IVF yesterday, PO 480 cc PO yesterday, Cr 4.3 today. Continue on PO and repeat in AM.               11/24: PO remains poor, patient remains on 75 cc/hr maintenance IVF, Cr and BUN stable. DC IVF today and recheck next 3 days.               11/25: Encourage PO one additional day; initiate timed toiletting so patient does not avoid fluids d/t incontinence. Labs in AM.     Latest Ref Rng & Units 11/03/2022    6:59 AM 11/02/2022    5:52 AM 11/01/2022    7:04 AM  BMP  Glucose 70 - 99 mg/dL 95  93  95   BUN 8 - 23 mg/dL 25  26  24    Creatinine 0.61 - 1.24 mg/dL 4.70  4.69  4.54   Sodium 135 - 145 mmol/L 137  139  138   Potassium 3.5 - 5.1 mmol/L 4.3  4.2  4.2   Chloride 98 - 111 mmol/L 105  105  109   CO2 22 - 32 mmol/L 20  20  19    Calcium 8.9 - 10.3 mg/dL 7.9  7.9  7.9   See nephro note   9. E coli UTI: completed abx             --monitor voiding with PVR/Bladder scan 10. Anemia fo chronic disease: Drop in Hgb due to hydration             --continue iron supplement.  11. Vitamin D deficiency: Started on ergocalciferol on 11/15 12.  Hypocalcemia: Ca 8.1 w/  alb2.7-->continue supplement as corrects to almost normal.               -- Ca stable 8.1-7.7 11/23  13. Questionable Schizophrenia/Psych Hx: CIR note from 2010 reviewed.  14. Spasticity due to L MCA infarct- focal affecting elbow and knee flexors Xeomin injection today  After verbal consent Xeomin diluted with preservative free NS Lot #[id number] exp 1/25, [id number] exp 1/25 50u per ml R biceps 50U R brachiorad 50U Right hamstrings 250 U Waste 50U Pt tolerated procedure well No post procedure restrictions Expect onset time ~1wk 15. Incontinent of bowel and bladder- has condom cath and using brief-will monitor skin closely.              -  have changed senna S to prn which shou8ld help firm up stools and may improve bowel incontinence  16. Low Mg  11/22- Up to 2.1- con't to monitor weekly and replete as required- appreciate nephro assist       LOS: 9 days A FACE TO Yre[place] E Jaretssi Kraker 11/05/2022, 7:14 AM

## 2022-11-05 NOTE — Progress Notes (Signed)
Speech Language Pathology Daily Session Note  Patient Details  Name: Daniel [name] MRN: [id number] Date of Birth: [birth year]  Today's Date: 11/05/2022 SLP Individual Time: 0920-0938 SLP Individual Time Calculation (min): 18 min and Today's Date: 11/05/2022 SLP Missed Time: 27 Minutes Missed Time Reason: Patient fatigue  Short Term Goals: Week 2: SLP Short Term Goal 1 (Week 2): Pt will consume current diet with improved mastication, oral manipulation, minimal oral residuals, and minimal overt s/sx of aspiration with sup A cues for implementation of swallowing precautions/strategies SLP Short Term Goal 2 (Week 2): Patient will implement speech intelligibility strategies at the word and short sentence level with 75% intelligiblity with mod A for implementation of speech intelligibility strategies SLP Short Term Goal 3 (Week 2): Patient will recall and demonstrate use of memory compensations to increase independence and safety at home with mod A verbal cues. SLP Short Term Goal 4 (Week 2): Patient will demonstrate functional problem solving for basic and familiar tasks with Mod A verbal cues.  Skilled Therapeutic Interventions: Skilled ST treatment focused on cognitive-linguistic and speech goals. Pt was sleeping on arrival and appeared very drowsy upon waking to mod verbal stimuli. SLP with plan to assess dys 2 tolerance and potential dys 3 trial however pt too lethargic for safe consumption and trials were therefore deferred at this time. Pt initially responded to simple questions with max A to implement speech strategies at the word level which appeared c/b drowsiness. Pt with limited recall and provided vague response of what him and OT worked on earlier this morning. SLP with attempts to increase stimulation by elevating HOB, turning on all lights in room, and max attempts to bring pt EOB however these efforts were ineffective in increasing arousal and therefore concluded 27 minutes early d/t  fatigue and limited participation. Will attempt to have pt scheduled immediately following PT or OT sessions to help with arousal. Patient was left in bed with alarm activated and immediate needs within reach at end of session. Continue per current plan of care.      Pain  None/denied  Therapy/Group: Individual Therapy  Patty Sermons 11/05/2022, 9:33 AM

## 2022-11-05 NOTE — Progress Notes (Signed)
Arrived to patient's room. PT at bedside working with patient at this time. Notified nurse to re-consult when patient is in the bed and available for PIV placement. Fran Lowes, RN VAST

## 2022-11-05 NOTE — Progress Notes (Signed)
Physical Therapy Session Note  Patient Details  Name: Daniel [name] MRN: [id number] Date of Birth: [birth year]  Today's Date: 11/05/2022 PT Individual Time: 1020-1055 and 5625-6389 PT Individual Time Calculation (min): 35 min and 62 min   Short Term Goals: Week 2:  PT Short Term Goal 1 (Week 2): Pt will perform supine<>sit with min assist using bed features as needed PT Short Term Goal 2 (Week 2): Pt will maintain sitting balance EOB with CGA while setting up his wheelchair for transfers PT Short Term Goal 3 (Week 2): Pt will perform bed<>chair transfers using LRAD with min assist consistently PT Short Term Goal 4 (Week 2): Pt will perform at least 72ft of wheelchair mobility with CGA  Skilled Therapeutic Interventions/Progress Updates:    Session 1: Pt received asleep, supine in bed and requires mod stimulus to awaken but then agreeable to therapy session. Supine>sitting L EOB, HOB ~15degrees elevated and relying heavily on L UE support on bedrail, able to manage LEs off EOB and bring trunk upright with min assist only for R UE management!!  Sitting EOB with supervision. Repeated sit<>stands to/from EOB x3 with L UE support on bedrail and min assist to rise into standing with cuing for improved upright posture upon standing - pt lacks full R hip/knee extension and maintains excessive forward trunk flexion with crouched posture - pt with poor eccentric control when lowering to sit, cuing to "fold up" when going to sit for improvement as pt tends to lean backwards instead.  Block practice stand pivot transfers w/c<>EOB using LUE support on bedrail.  R EOB>w/c with min assist for lifting to stand and balance while turning due to R lateral lean, therapist did not facilitate improved R LE alignment prior to coming to stand to see how this would impact pt's balance during the transfer (resulted in greater R lean), verbal/visual cuing used to direct pt to have larger R lateral steps to maintain  balance while turning and pt relying heavily on L UE support on bedrail for balance and eccentric lowering into seat.  L w/c>EOB with lighter min assist for lifting and balance while turning with pt continuing to use L UE support on armrest transitioned to bedrail for balance with pt having to take very small steps towards L.   Pt states "I don't feel that good today." Pt requesting to rest and reports need to void bladder - provided urinal and pt continent of 200ccs (notified NT to chart).  Sit>supine with min assist for trunk descent to improve midline orientation in the bed. Pt left supine in bed with needs in reach and bed alarm on.     Session 2: Pt received supine in bed, knees drawn up towards his chest, and pt agreeable to therapy session despite pt reporting he "feels sick...like a cold or something."   Supine>sitting L EOB, HOB slightly elevated and relying heavily on L UE support on bedrail with min assist to bring trunk upright - pt able to use L UE to assist with R LE management if needed.  Sitting EOB, educated pt on how to don brief with pt having significant difficulty orienting the item requiring total assist. Sit>stand EOB>L UE support on bedrail, with min assist for lifting to stand - pt requires a minute to initiate and rises up very slowly but with good control, stays in crouched posture with excessive forward trunk flexion and B LE hip/knee flexion while standing. Pt attempts to use L UE to pull brief up over  hips but requires mod assist for balance due to R lateral lean when not using L UE support on bedrail therefore provided total assist to pull brief up remainder of way.  Therapist placed w/c for R stand pivot transfer from EOB and attempted to have pt lock brakes, but pt unsafe to reach across the chair to lock brakes on opposite side due to R lateral LOB in sitting requiring min assist to maintain upright.  R stand pivot EOB>w/c with pt relying on L UE support on bedrail  for lifting and balance with min assist and verbal/visual cuing to take larger R LE lateral step lengths to improve balance and rotation while turning.  L hemi-technique w/c propulsion ~152ft to main therapy gym with min/mod assist and pt having to primarily use L LE otherwise has difficulty coordinating both UE and LE push-strokes without consistently veering toward R.  Set-up w/c for R stand pivot transfer with max/total step-by-step cuing and hand-over-hand facilitation to manage w/c parts (brakes and swinging R LE out of the way) - completed transfer with min assist for balance and poor eccentric control when lowering.   Pt able to maintain static sitting balance EOM with distant supervision. Therapist placed yellow [place] on brakes to improve pt's ability to locate them independently.   L stand pivot EOM>w/c with light min assist for lifting to stand and balance while turning. Transported back to his room.   Pt able to propel w/c over next to bed to set-up for stand pivot transfer with min assist for forward propulsion of chair then with step-by-step verbal cuing pt able to lock brakes without manual facilitation but continues to require manual assist to swing R LE leg rest out of the way (will discuss with ATP how to make this more easy for pt to manage).   L stand pivot w/c>EOB with light min assist and pt continuing to have poor eccentric control when lowering.   Sit>supine with pt using L UE support on bedrail to assist with lowering trunk (lies backwards towards opposite side bedrails but is able to assist with moving his shoulders towards middle of bed better).   Pt left seated, supported upright in bed with meal tray set-up and pt positioned to use urinal independently - bed alarm on and needs in reach. Nursing staff aware of pt's position to provide appropriate intermittent supervision for meal.   Therapy Documentation Precautions:  Precautions Precautions: Fall, Other  (comment) Precaution Comments: acute on chronic R hemiparesis with R UE and LE posturing/contractures Restrictions Weight Bearing Restrictions: No   Pain:  Session 1: No reports of pain throughout session, but pt reports not feeling well.  Session 2: No reports of pain throughout session.   Therapy/Group: Individual Therapy  Tawana Scale , PT, DPT, NCS, CSRS 11/05/2022, 7:55 AM

## 2022-11-05 NOTE — Progress Notes (Signed)
Occupational Therapy Weekly Progress Note  Patient Details  Name: Daniel [name] MRN: [id number] Date of Birth: March 05, 1946  Beginning of progress report period: October 28, 2022 End of progress report period: November 04, 2022  Today's Date: 11/05/2022 OT Individual Time: 0800-0900 OT Individual Time Calculation (min): 60 min    Patient has met 4 of 4 short term goals.  Pt is demonstrating improved sitting balance, increase independence with grooming, and increased independence in LB dressing. Pt is receiving family support from his sister who will be providing supervision to him at home and a family friend who is able to provide physical support for BADLs and IADL tasks.   Patient continues to demonstrate the following deficits: muscle weakness and muscle joint tightness, decreased cardiorespiratoy endurance, abnormal tone and unbalanced muscle activation, decreased attention to right, decreased awareness, decreased problem solving, and delayed processing, and decreased sitting balance, decreased standing balance, decreased postural control, and hemiplegia and therefore will continue to benefit from skilled OT intervention to enhance overall performance with BADL.  Patient progressing toward long term goals..  Continue plan of care.  OT Short Term Goals Week 1:  OT Short Term Goal 1 (Week 1): Pt will maintian sitting balance min A for 3 minutes OT Short Term Goal 1 - Progress (Week 1): Met OT Short Term Goal 2 (Week 1): Pt will perform LB dressing mod A OT Short Term Goal 2 - Progress (Week 1): Met OT Short Term Goal 3 (Week 1): Pt will perform grooming mod A OT Short Term Goal 3 - Progress (Week 1): Met OT Short Term Goal 4 (Week 1): Further assess vision in functional context OT Short Term Goal 4 - Progress (Week 1): Met Week 2:  OT Short Term Goal 1 (Week 2): Pt will maintain dynamic seated balance during BADLs with SBA OT Short Term Goal 2 (Week 2): Pt will complete LB dressing  min A OT Short Term Goal 3 (Week 2): Pt will complete UB dressing with min A OT Short Term Goal 4 (Week 2): Pt will complete toileting mod A  Skilled Therapeutic Interventions/Progress Updates:     Pt received resting in bed in good spirits and motivated to participate in skilled OT session. Pt reporting 0/10 pain. Pt requesting to complete bath this AM.   Pt supine>EOB min A with mod verbal cues for body mechanics and hand positioning. Pt able to sit EOB during bathing  ~10 minutes with SBA and mod cueing to bring trunk to midline. Weight bearing through RUE facilitated during task to increase muscle activation and return. Pt able to complete UB bathing with mod HHA to wash RUE and mod cueing to ensure proper cleaning. Pt completed LB bathing with mod A to wash bottom and R lower leg. Pt donned gown using hemi dressing technique with min A and mod cueing on technique. Pt stand pivot>wc mod A to R with increased time and mod cues for technique.   Pt propelled self to sink using LLE/LUE. Pt educated on modified technique for opening and donning tooth paste brushing teeth with min A and mod cues. Pt transported total A to therapy gym for time efficiency.   Pt completed NMR RUE exercises seated at table with a focus on elbow flexion/extension and shoulder protraction/retraction in preparation for functional reaching. Pt transported back to room total A in wc. Pt stand pivot>EOB to L side min A with min cueing. Pt mod A back to bed to position trunk and lift legs.  Therapy Documentation Precautions:  Precautions Precautions: Fall, Other (comment) Precaution Comments: acute on chronic R hemiparesis with R UE and LE posturing/contractures Restrictions Weight Bearing Restrictions: No   ADL: ADL Eating: Supervision/safety Where Assessed-Eating: Bed level Grooming: Maximal assistance, Maximal cueing Where Assessed-Grooming: Edge of bed Upper Body Bathing: Maximal assistance Where Assessed-Upper  Body Bathing: Edge of bed Lower Body Bathing: Dependent Where Assessed-Lower Body Bathing: Edge of bed Upper Body Dressing: Maximal assistance Where Assessed-Upper Body Dressing: Edge of bed Lower Body Dressing: Dependent Where Assessed-Lower Body Dressing: Edge of bed Toileting: Dependent Where Assessed-Toileting: Other (Comment) (Condom cath and incontinent of bowel) Toilet Transfer: Unable to assess Toilet Transfer Method: Unable to assess Gaffer Transfer: Unable to assess Social research officer, government Method: Unable to assess  Therapy/Group: Individual Therapy  Janey Genta 11/05/2022, 12:44 PM

## 2022-11-05 NOTE — Progress Notes (Addendum)
Patient ID: Daniel [name], male   DOB: 1946/11/08, 76 y.o.   MRN: [id number]  Sw received follow up from patient sister, Daniel [name]. Daniel [name] has spoken with her friend, Daniel [name] who lives in walking distance of patient and sister about potentially assisting pt at home. Daniel [name] has agreed to assist the patient at discharge and expressed he has experience due to caring for his mother who suffered a stroke. Daniel [name] prefers not to come into the hospital due to his lack of transportation. Daniel [name] shared that he is able to lift, transfer and assist with ADLS.  Daniel [name] shares that she will send the picture of the wheelchair today. Sister appreciates care. No additional questions or concerns.

## 2022-11-06 NOTE — Progress Notes (Addendum)
Patient ID: Daniel [name], male   DOB: 01/20/1946, 76 y.o.   MRN: [id number]  10:07 AM Patient request for authorization started.  Auth ID: [id number] Clinicals to be faxed to: [phone]  10:45 AM Patient sister informed. Patient sister expressed to SW that patient friend, Daniel [name] is comfortable with inct care, he would prefer to attempt to get patient to the [facility] or bathroom, but does not mind assist with inct care in bed if needed. SW made attempt to call patient niece to arrange family education, sw unable to leave VM. Sw discussed with patient sister , Daniel [name] of the unsuccessful attempt to schedule family edu with patient niece. Sister expressed she has been making attempts with her but she has been busy with work. Sister expressed that she went to take pictures of the [facility] and realized the breaks do not work and it is heavy duty. No additional questions or concerns. SW will continue to follow up.

## 2022-11-06 NOTE — Progress Notes (Signed)
Speech Language Pathology Daily Session Note  Patient Details  Name: Daniel [name] MRN: [id number] Date of Birth: [birth year]  Today's Date: 11/06/2022 SLP Individual Time: 0802-0904 SLP Individual Time Calculation (min): 62 min  Short Term Goals: Week 2: SLP Short Term Goal 1 (Week 2): Pt will consume current diet with improved mastication, oral manipulation, minimal oral residuals, and minimal overt s/sx of aspiration with sup A cues for implementation of swallowing precautions/strategies SLP Short Term Goal 2 (Week 2): Patient will implement speech intelligibility strategies at the word and short sentence level with 75% intelligiblity with mod A for implementation of speech intelligibility strategies SLP Short Term Goal 3 (Week 2): Patient will recall and demonstrate use of memory compensations to increase independence and safety at home with mod A verbal cues. SLP Short Term Goal 4 (Week 2): Patient will demonstrate functional problem solving for basic and familiar tasks with Mod A verbal cues.  Skilled Therapeutic Interventions: Skilled ST treatment focused on cognitive-linguistic goals. Pt was in bed on arrival and more awake/alert as compared to previous encounters. Breakfast tray was at bedside and it appeared pt had consumed ~25% of meal. Nurse arrived for med pass. Pt consumed whole pills, one at a time with thin water with effective oral clearance and no overt s/sx of aspiration. Pt performed oral care and washed face at bed level following set-up A. Pt was oriented to person and month independent of cues. Pt required max A verbal/visual cues to orient to day of week, current year, and situation (stated "I'm here for a check-up"). Also required max A verbal cues to recall correct year of birth. SLP facilitated basic problem solving,reasoning, recall and sustained attention with novel card game, Uno. Pt completed task with steady max A for problem solving and recall of instructions using  external aid. Pt required significantly extended time to read brief instructions (i.e., "match by color or number") for use of external aid (suspect reading deficits, ? acute deficit) and required sup A phonemic cues for word finding to identify colors of cards. Pt exhibited poor carry over throughout task and still required max A cues to recall task instructions at end of activity despite maximum repetition throughout. Pt implemented speech strategies with mod A verbal cues at the word level to achieve 50-75% intelligibility today (improved from yesterday). Patient was left in bed with alarm activated and immediate needs within reach at end of session. Continue per current plan of care.      Pain  None/denied  Therapy/Group: Individual Therapy  Patty Sermons 11/06/2022, 8:56 AM

## 2022-11-06 NOTE — Progress Notes (Signed)
Physical Therapy Session Note  Patient Details  Name: Daniel [name] MRN: [id number] Date of Birth: 1946/01/31  Today's Date: 11/06/2022 PT Individual Time: 1002-1100 PT Individual Time Calculation (min): 58 min   Short Term Goals: Week 2:  PT Short Term Goal 1 (Week 2): Pt will perform supine<>sit with min assist using bed features as needed PT Short Term Goal 2 (Week 2): Pt will maintain sitting balance EOB with CGA while setting up his wheelchair for transfers PT Short Term Goal 3 (Week 2): Pt will perform bed<>chair transfers using LRAD with min assist consistently PT Short Term Goal 4 (Week 2): Pt will perform at least 78ft of wheelchair mobility with CGA  Skilled Therapeutic Interventions/Progress Updates:   First session:  Pt presents semi-reclined in bed w/ knees bent up, c/o pain to B feet.  Threaded shorts over feet and initiates sup to sit, but noted smell.  Small smear in brief and changed, NT to chart.  Pt required mod A for rolling side to side,  L > R.  Pt able to bridge to complete donning of shorts over hips.  Pt required mod A for sup to sit transfer to EOB.  Once sitting EOB, pt able to maintain balance w/o UE support, although loses posteriorly if distracted or moving arms or head.  Pt sat EOB for mod A to don scrub top.  Pt performed sit to stand w/ min A and then mod A for attempted step-pivot to w/c, unable to step w/RLE.  Pt practiced locking/unlocking brakes and positioning R leg rest.  Pt negotiated w/c x 100' x 2 w/ L UE and constant verbal cues for LLE.  Pt veers to right if using UE only.  Pt requires verbal and visual cues for scanning to R to avoid obstacles and mod A from PT for safety.  Pt remained sitting in w/c w/ chair alarm on, 1/2 tray on right and all needs in reach.  Pt aware of use of call bell for nursing.  Second session:  Pt presents supine in bed asleep.  Pt arouses but refuses therapy 2/2 fatigue, just transferred back to bed.  Bed alarm on and all  needs in reach.  NT in for vitals.  Missed time 30 min.     Therapy Documentation Precautions:  Precautions Precautions: Fall, Other (comment) Precaution Comments: acute on chronic R hemiparesis with R UE and LE posturing/contractures Restrictions Weight Bearing Restrictions: No General:   Vital Signs:  Pain:10/10 Pain Assessment Pain Scale: 0-10 Pain Score: 6  Pain Type: Acute pain Pain Location: Foot Pain Orientation: Right;Left Pain Descriptors / Indicators: Aching Pain Intervention(s): Medication (See eMAR)     Therapy/Group: Individual Therapy  Ladoris Gene 11/06/2022, 11:02 AM

## 2022-11-06 NOTE — Consult Note (Signed)
Neuropsychological Consultation   Patient:   Daniel [name]   DOB:   1946-11-30  MR Number:  [id number]  Location:  [facility] [address] C[facility] [address] [place] [id number] [place] [address] Dept: [place]: [phone]           Date of Service:   11/06/2022  Start Time:   9 AM End Time:   10 AM  Provider/Observer:  Ilean Skill, Psy.D.       Clinical Neuropsychologist       Billing Code/Service: [phone]  Chief Complaint:    Daniel [name] is a 76 year old left handed male referred for neuropsychological evaluation due to coping and adjustment issues and ongoing cognitive difficulties after a recent stroke with history of prior strokes and previous psychiatric diagnoses including schizophrenia.  Patient is currently well-managed as far as his psychiatric status.  Patient currently not taking any mood stabilizing medications or atypical antipsychotics.  Patient was admitted on 10/20/2022 with right facial droop and slurred speech.  Patient was found to have acute on chronic renal failure and other metabolic disturbance.  Patient reported that he had had multiple falls and worsening of weaknesses over the prior 2 days.  MRI brain revealed acute ischemia of left MCA territory and right greater than left subdural hematoma, generalized volume loss and encephalomalacia at prior L ACA infarct.  MRA showed occlusion of left ICA proximal to skull base with diminished flow enhancement in L MCA.  Patient with long history of significant microvascular ischemic disease small vessel disease identified initially more than a decade ago.  Once medical stability was achieved patient was referred for inpatient comprehensive rehabilitation stay due to functional deficits.  Reason for Service:  Patient referred for neuropsychological evaluation due to coping and adjustment issues in the setting of ongoing cognitive difficulties after recent  stroke in the setting of multiple prior strokes.  Below is the HPI for the current admission.  HPI: Daniel [name]. Lukas is a 76 year old  L handed male with history of HTN, hyperlipidemia, stroke w/residual R-HP, CKD-baseline SCr-2.0-2.3 range, question hx of schizophrenia, BPH who was admitted on 10/20/22 with right facial droop and slurred speech. He was found to have acute on chronic renal failure with BUN 41, SCr 5.8, bicarb 19 with mild metabolic acidosis as well as hypokalemia with K-2.9. patient reported multiple falls and worsening of weakness 2 days PTA. MRI brain done revealing acute ischemia in L-MCA territory and R>L SDH, generalized volume loss and encephalomalacia at prior L-ACA infarct. MRA showed occlusion of left ICA proximal to skull base with diminished flow enhancement in L-MCA. Carotid dopplers showed total occlusion of L-ICA, and bilateral ECA with > 50% stenosis.   Dr. Curly Shores felt that stroke was due to carotid stenosis which had progressed to total occlusion. Dr. Marcello Moores consulted and felt that no surgical intervention or follow up needed and recommended avoiding  DAPT and ASA alone likely safe. Dr. Erlinda Hong recommended repeat CT head which was stable and ASA added on 11/17.     2 D echo showed EF 60-65% with mild LVH. Renal ultrasound showed medical renal disease with minimal to mild interval volume loss of both kidneys. Patient treated with bicarb, potasium supplemented and foley placed with 500 cc urine return.  Dr. Carolin Sicks consulted and recommended fluid bolus as patient looked dry followed by IV bicarb and monitoring magnesium level.  He was treated with Zithromax for RML infiltrate concerning for PNA  and as urine culture 11/17 showed >100,000 colonies of Ecoli ws started on IV ceftriaxone on 11/18. Intermittent fevers are resolving and  Hypocalcemia corrects to near normal and phosphate level normal. SCr improving and nephrology recommends continuing IVF till intake stable and consider  stopping Sodium bicarb if serum bicarb> 24,   Current Status:  Patient was awake and alert when I entered the room with slowed information processing speed and clear expressive language deficits.  Patient was aware of his hospitalization and aware that he was currently in the rehab program.  Limits to full assessment due to expressive language deficits and slurred speech.  Patient denied any acute exacerbation of underlying psychiatric status and is aware of his prior psychiatric diagnoses.  Patient reports no increase or worsening of symptoms such as depression or anxiety/agitation.  Behavioral Observation: Daniel [name]  presents as a 76 y.o.-year-old Left handed African American Male who appeared his stated age. his dress was Appropriate and he was Well Groomed and his manners were Appropriate to the situation.  his participation was indicative of Inattentive and Redirectable behaviors.  There were physical disabilities noted.  he displayed an appropriate level of cooperation and motivation.     Interactions:    Active Inattentive and Redirectable  Attention:   abnormal and attention span appeared shorter than expected for age  Memory:   abnormal; remote memory intact, recent memory impaired  Visuo-spatial:  not examined  Speech (Volume):  low  Speech:   garbled; slurred  Thought Process:  Circumstantial  Though Content:  WNL; not suicidal and not homicidal  Orientation:   person, place, and situation  Judgment:   Fair  Planning:   Poor  Affect:    Blunted and Flat  Mood:    Dysphoric  Insight:   Fair  Intelligence:   normal  Medical History:   Past Medical History:  Diagnosis Date   Hypertension    Psychosis ([place])    Schizophrenia ([place]) 1974   Stroke ([place]) 2010   Right Hemiparesis         Patient Active Problem List   Diagnosis Date Noted   Acute ischemic left middle cerebral artery (MCA) stroke ([place]) 10/27/2022   Acute CVA (cerebrovascular accident) ([place])  10/20/2022   Subdural hematoma ([place]) 69/67/8938   Metabolic acidosis 09/22/5101   Hypokalemia 10/20/2022   Hypocalcemia 10/20/2022   BPH (benign prostatic hyperplasia) 10/20/2022   Xerosis cutis 04/30/2020   Closed displaced intertrochanteric fracture of right femur ([place]) 12/11/2019   LFT elevation    Acute kidney injury superimposed on chronic kidney disease ([place])    Nicotine dependence with current use 03/29/2018   Poor dentition 01/17/2015   Peripheral vascular disease ([place]) 01/18/2014   Benign hypertension with CKD (chronic kidney disease) stage III ([place]) 01/18/2014   Hyperlipidemia 08/17/2011   Hemiplegia, late effect of cerebrovascular disease ([place]) 10/17/2009   Anemia 10/16/2008   HTN (hypertension) 10/16/2008   Substance abuse in remission ([place]) 10/16/2008   Schizophrenia ([place]) 12/07/1972   Psychiatric History:  Patient has prior history of diagnosis of schizophrenia going back to 1974.  Patient does not appear to be on any current psychotropic medications for schizophrenia or psychosis.  Patient also has a past history of substance abuse in remission for some time.  Patient has been followed by palliative care.  Family Med/Psych History:  Family History  Problem Relation Age of Onset   Hypertension Mother    CVA Sister    Breast cancer Sister  Prostate cancer Brother     Impression/DX:  KO BARDON is a 76 year old left handed male referred for neuropsychological evaluation due to coping and adjustment issues and ongoing cognitive difficulties after a recent stroke with history of prior strokes and previous psychiatric diagnoses including schizophrenia.  Patient is currently well-managed as far as his psychiatric status.  Patient currently not taking any mood stabilizing medications or atypical antipsychotics.  Patient was admitted on 10/20/2022 with right facial droop and slurred speech.  Patient was found to have acute on chronic renal failure and other metabolic  disturbance.  Patient reported that he had had multiple falls and worsening of weaknesses over the prior 2 days.  MRI brain revealed acute ischemia of left MCA territory and right greater than left subdural hematoma, generalized volume loss and encephalomalacia at prior L ACA infarct.  MRA showed occlusion of left ICA proximal to skull base with diminished flow enhancement in L MCA.  Patient with long history of significant microvascular ischemic disease small vessel disease identified initially more than a decade ago.  Once medical stability was achieved patient was referred for inpatient comprehensive rehabilitation stay due to functional deficits.  Patient was awake and alert when I entered the room with slowed information processing speed and clear expressive language deficits.  Patient was aware of his hospitalization and aware that he was currently in the rehab program.  Limits to full assessment due to expressive language deficits and slurred speech.  Patient denied any acute exacerbation of underlying psychiatric status and is aware of his prior psychiatric diagnoses.  Patient reports no increase or worsening of symptoms such as depression or anxiety/agitation.          Electronically Signed   _______________________ Ilean Skill, Psy.D. Clinical Neuropsychologist

## 2022-11-06 NOTE — Progress Notes (Signed)
Occupational Therapy Session Note  Patient Details  Name: Daniel [name] MRN: [id number] Date of Birth: 1946-02-25  Today's Date: 11/06/2022 OT Individual Time: 1300-1408 OT Individual Time Calculation (min): 68 min   Short Term Goals: Week 2:  OT Short Term Goal 1 (Week 2): Pt will maintain dynamic seated balance during BADLs with SBA OT Short Term Goal 2 (Week 2): Pt will complete LB dressing min A OT Short Term Goal 3 (Week 2): Pt will complete UB dressing with min A OT Short Term Goal 4 (Week 2): Pt will complete toileting mod A  Skilled Therapeutic Interventions/Progress Updates:     Pt received resting in wc. Pt requesting to return to bed upon OT arrival. Pt educated on importance of participating in skilled therapy session with noted improvement in motivation to participate following education. Pt taken to restroom at beginning of session to follow toileting schedule. Pt squat pivot to BSC min A to L side to guide hips with increased time for motor planning and safety. Pt provided increased time on toilet d/t Pt reporting he felt the need to have a bowel movement (continent bowel movement in [facility]). Pt educated on modified strategy for completing posterior peri-care with maximal verbal cues provided for weight shifting. Pt unable to complete peri-care at this time d/t challenges weight shifting and leaning anteriorly to reach bottom. Pt complete sit>stand using RW to allow OT to assist with clean up. Pt required min-mod A to maintain standing balance. Pt mod A to pull pants over waist.   Pt squat pivot to R to EOB mod A with increased time. Pt completed AAROM and NMR exercises seated EOB with mod hand under hand A to facilitate elbow extension and flexion. Pt provided mod cues for body positioning and alignment seated EOB to facilitate improved midline orientation. Pt seated in front of mirror to provided visual feedback on posture with Pt requiring max cues to maintain head in midline and  min A to maintain seated balance. Pt mod A to bring legs into bed at end of session. Pt was left resting in bed with call bell in reach, urinal in reach, bed alarm on, and all needs met.   Therapy Documentation Precautions:  Precautions Precautions: Fall, Other (comment) Precaution Comments: acute on chronic R hemiparesis with R UE and LE posturing/contractures Restrictions Weight Bearing Restrictions: No  Pain: Pain Assessment Pain Scale: 0-10 Pain Score: 4  Faces Pain Scale: Hurts a little bit Pain Type: Acute pain Pain Location: Foot Pain Orientation: Right;Left Pain Descriptors / Indicators: Aching Pain Intervention(s): Medication (See eMAR) PAINAD (Pain Assessment in Advanced Dementia) Breathing: normal Negative Vocalization: none Body Language: relaxed Consolability: no need to console ADL: ADL Eating: Supervision/safety Where Assessed-Eating: Bed level Grooming: Maximal assistance, Maximal cueing Where Assessed-Grooming: Edge of bed Upper Body Bathing: Maximal assistance Where Assessed-Upper Body Bathing: Edge of bed Lower Body Bathing: Dependent Where Assessed-Lower Body Bathing: Edge of bed Upper Body Dressing: Maximal assistance Where Assessed-Upper Body Dressing: Edge of bed Lower Body Dressing: Dependent Where Assessed-Lower Body Dressing: Edge of bed Toileting: Dependent Where Assessed-Toileting: Other (Comment) (Condom cath and incontinent of bowel) Toilet Transfer: Unable to assess Toilet Transfer Method: Unable to assess Gaffer Transfer: Unable to assess Social research officer, government Method: Unable to assess   Therapy/Group: Individual Therapy  Janey Genta 11/06/2022, 2:18 PM

## 2022-11-07 DIAGNOSIS — G8111 Spastic hemiplegia affecting right dominant side: Secondary | ICD-10-CM

## 2022-11-07 DIAGNOSIS — I10 Essential (primary) hypertension: Secondary | ICD-10-CM

## 2022-11-07 LAB — RENAL FUNCTION PANEL
Albumin: 3 g/dL — ABNORMAL LOW (ref 3.5–5.0)
Anion gap: 11 (ref 5–15)
BUN: 25 mg/dL — ABNORMAL HIGH (ref 8–23)
CO2: 18 mmol/L — ABNORMAL LOW (ref 22–32)
Calcium: 7.9 mg/dL — ABNORMAL LOW (ref 8.9–10.3)
Chloride: 107 mmol/L (ref 98–111)
Creatinine, Ser: 4.81 mg/dL — ABNORMAL HIGH (ref 0.61–1.24)
GFR, Estimated: 12 mL/min — ABNORMAL LOW (ref >60)
Glucose, Bld: 134 mg/dL — ABNORMAL HIGH (ref 70–99)
Phosphorus: 3.5 mg/dL (ref 2.5–4.6)
Potassium: 4.2 mmol/L (ref 3.5–5.1)
Sodium: 136 mmol/L (ref 135–145)

## 2022-11-07 NOTE — Progress Notes (Signed)
[facility] NEPHROLOGY PROGRESS NOTE  Assessment/ Plan: Pt is a 76 y.o. yo male  with past medical history significant for hypertension, HLD, BPH, prior stroke with residual right hemiparesis, CKD 3B, presented with right sided facial droop and slurred speech, seen as a consultation for the evaluation of acute kidney injury on CKD.   #Acute kidney injury on CKD stage IV: cr around 2.2-2.7 in 2021, no recent baseline creatinine level available.  Kidney ultrasound with chronic finding and volume loss.  This is likely acute kidney injury due to hemodynamically mediated in the setting of decreased oral intake, use of ACE inhibitor and UTI.   S/p supportive care with IV fluids.   - on flomax with hx urinary retention per primary team note  - Strict ins/outs - incontinence complicates  - Kidney function has continued to slightly worsen over time.  Advanced CKD.  - His sister helps him with medical decisions and she shares that he would want conservative kidney management - she doesn't think he would want dialysis if it were needed.  We will continue with supportive care.  He is also a medically poor candidate for dialysis   # CKD stage IV - Cr 2.2 - 2.7 in 2021   #Acute left MCA ischemic stroke/acute bilateral SDH: No intervention per neurosurgeon.   s/p Neurology eval.  Per NS, only aspirin for anticoagulation.   #Hypertension: controlled on current regimen   #Metabolic acidosis due to renal failure: Improved, on oral sodium bicarbonate.  #History of BPH: On Flomax.  US renal without hydronephrosis.   #Hypokalemia: improved  #Hypocalcemia/hypophosphatemia: Phosphorus normal now. Calcium ok   # Anemia due to CKD: IV iron x 2 doses. Defer ESA in the setting of recent stroke   Disposition - per rehab team.  As above, conservative kidney management for advanced CKD.  Nephrology will see intermittently.      ----------------------   Subjective:   Strict ins/outs are still not  available.  He had 650 mL and 2 unmeasured voids over 12/1.  He is eating a dysphagia diet for dinner on my arrival and thinks he is getting enough to eat.   Review of systems:   Denies n/v  dysphagia No shortness of breath or chest pain   Objective Vital signs in last 24 hours: Vitals:   11/07/22 0303 11/07/22 0320 11/07/22 0836 11/07/22 1310  BP: 106/71  (!) 163/74 (!) 157/67  Pulse: 76  91 83  Resp: 18   20  Temp: 98.5 F (36.9 C)   97.7 F (36.5 C)  TempSrc: Oral   Oral  SpO2: 99%   99%  Weight:  66.5 kg    Height:       Weight change: -3.1 kg  Intake/Output Summary (Last 24 hours) at 11/07/2022 1732 Last data filed at 11/07/2022 1300 Gross per 24 hour  Intake 814 ml  Output 750 ml  Net 64 ml       Labs: RENAL PANEL Recent Labs    10/21/22 1456 10/22/22 0427 10/23/22 0324 10/24/22 0405 10/25/22 0415 10/26/22 0348 10/27/22 0844 10/28/22 0549 10/29/22 0452 10/30/22 0552 10/31/22 0656 11/01/22 0704 11/02/22 0552 11/03/22 0659 11/05/22 0715 11/07/22 0913  NA  --  138 141 141 141 140 139 140 140 141 138  138 139 137 139 136  K  --  3.0* 4.4 4.7 4.5 4.6 4.3 4.6 4.4 4.2 4.2 4.2 4.2 4.3 4.3 4.2  CL  --  111 112* 107 107 108 108 110 113*  111 110 109 105 105 107 107  CO2  --  19* 22 22 22  21* 21* 21* 21* 20* 19* 19* 20* 20* 20* 18*  GLUCOSE  --  104* 93 97 99 92 96 94 94 94 91 95  93 95 97 134*  BUN  --  40* 38* 36* 33* 31* 27* 28* 24* 21 23 24* 26* 25* 26* 25*  CREATININE  --  5.16* 4.72* 4.65* 4.38* 4.38* 4.27* 4.53* 4.30* 4.26* 4.48* 4.54* 4.69* 4.70* 4.83* 4.81*  CALCIUM  --  6.4* 6.6* 7.1* 7.4* 7.8* 8.1* 8.1* 7.7* 7.7* 7.9* 7.9* 7.9* 7.9* 8.2* 7.9*  MG 1.5*  --  1.6*  --  1.6* 2.1  --  1.8  --   --   --   --  1.6*  --   --   --   PHOS  --   --  1.3* 2.3* 2.9 3.1 3.2  --   --   --   --   --   --  4.1 3.8 3.5  ALBUMIN  --  2.7* 2.6* 2.9* 2.6* 2.6* 2.7* 2.6*  --   --   --   --   --  3.0* 3.0* 3.0*     Liver Function Tests: Recent Labs  Lab 11/03/22 0659  11/05/22 0715 11/07/22 0913  ALBUMIN 3.0* 3.0* 3.0*   No results for input(s): "LIPASE", "AMYLASE" in the last 168 hours. No results for input(s): "AMMONIA" in the last 168 hours. CBC: Recent Labs    10/22/22 0427 10/23/22 0318 10/23/22 0324 10/25/22 0415 10/28/22 0549 11/02/22 0552  HGB 8.6* 8.5*  --  8.5* 8.6* 8.5*  MCV 86.8 85.3  --  89.4 89.4 86.6  VITAMINB12  --   --  375  --   --   --   FOLATE  --   --  8.6  --   --   --   FERRITIN  --   --  94  --   --   --   TIBC  --   --  189*  --   --   --   IRON  --   --  18*  --   --   --   RETICCTPCT  --   --  0.8  --   --   --     Cardiac Enzymes: No results for input(s): "CKTOTAL", "CKMB", "CKMBINDEX", "TROPONINI" in the last 168 hours. CBG: No results for input(s): "GLUCAP" in the last 168 hours.  Iron Studies: No results for input(s): "IRON", "TIBC", "TRANSFERRIN", "FERRITIN" in the last 72 hours. Studies/Results: No results found.  Medications: Infusions:     Scheduled Medications:  amLODipine  10 mg Oral Daily   aspirin EC  81 mg Oral Daily   atorvastatin  40 mg Oral Daily   calcium carbonate  1 tablet Oral BID WC   enoxaparin (LOVENOX) injection  30 mg Subcutaneous QHS   ferrous sulfate  325 mg Oral Q breakfast   folic acid  1 mg Oral Daily   gabapentin  100 mg Oral QHS   hydrALAZINE  75 mg Oral Q8H   magnesium gluconate  250 mg Oral Daily   metoprolol tartrate  25 mg Oral BID   nystatin  5 mL Oral QID   mouth rinse  15 mL Mouth Rinse 4 times per day   sodium bicarbonate  650 mg Oral TID   tamsulosin  0.4 mg Oral Daily   Vitamin D (Ergocalciferol)  50,000 Units Oral Q7 days    have reviewed scheduled and prn medications.  Physical Exam:      General elderly male in bed in no acute distress HEENT facies c/w prior stroke, assymetric Neck supple trachea midline Lungs clear to auscultation bilaterally normal work of breathing at rest  Heart S1S2 no rub Abdomen soft nontender nondistended Extremities  no edema  Psych no anxiety or agitation; somewhat flat affect  Neuro - awake on arrival; provides limited history   Claudia Desanctis, MD 11/07/2022 5:45 PM  LOS: 11 days

## 2022-11-07 NOTE — Plan of Care (Signed)
  Problem: Consults Goal: RH STROKE PATIENT EDUCATION Description: See Patient Education module for education specifics  Outcome: Progressing   Problem: RH BOWEL ELIMINATION Goal: RH STG MANAGE BOWEL WITH ASSISTANCE Description: STG Manage Bowel with mod I Assistance. Outcome: Progressing Goal: RH STG MANAGE BOWEL W/MEDICATION W/ASSISTANCE Description: STG Manage Bowel with Medication with Assistance. Outcome: Progressing   Problem: RH BLADDER ELIMINATION Goal: RH STG MANAGE BLADDER WITH ASSISTANCE Description: STG Manage Bladder With mod I  Assistance Outcome: Progressing Goal: RH STG MANAGE BLADDER WITH MEDICATION WITH ASSISTANCE Description: STG Manage Bladder With Medication With mod I Assistance. Outcome: Progressing   Problem: RH SAFETY Goal: RH STG ADHERE TO SAFETY PRECAUTIONS W/ASSISTANCE/DEVICE Description: STG Adhere to Safety Precautions With cues Assistance/Device. Outcome: Progressing   Problem: RH PAIN MANAGEMENT Goal: RH STG PAIN MANAGED AT OR BELOW PT'S PAIN GOAL Description: < 4 with prns Outcome: Progressing   Problem: RH KNOWLEDGE DEFICIT Goal: RH STG INCREASE KNOWLEDGE OF HYPERTENSION Description: Patient and sister will be able to manage HTN with medications and dietary modifications using educational handouts interdependently Outcome: Progressing Goal: RH STG INCREASE KNOWLEGDE OF HYPERLIPIDEMIA Description: Patient and sister will be able to manage HLD with medications and dietary modifications using educational handouts interdependently Outcome: Progressing Goal: RH STG INCREASE KNOWLEDGE OF STROKE PROPHYLAXIS Description: Patient and sister will be able to manage secondary risks with medications and dietary modifications using educational handouts interdependently Outcome: Progressing   Problem: Education: Goal: Knowledge of disease or condition will improve Outcome: Progressing Goal: Knowledge of secondary prevention will improve (MUST DOCUMENT  ALL) Outcome: Progressing Goal: Knowledge of patient specific risk factors will improve Elta [place] N/A or DELETE if not current risk factor) Outcome: Progressing   Problem: Ischemic Stroke/TIA Tissue Perfusion: Goal: Complications of ischemic stroke/TIA will be minimized Outcome: Progressing   Problem: Coping: Goal: Will verbalize positive feelings about self Outcome: Progressing Goal: Will identify appropriate support needs Outcome: Progressing   Problem: Health Behavior/Discharge Planning: Goal: Ability to manage health-related needs will improve Outcome: Progressing Goal: Goals will be collaboratively established with patient/family Outcome: Progressing   Problem: Self-Care: Goal: Ability to participate in self-care as condition permits will improve Outcome: Progressing Goal: Verbalization of feelings and concerns over difficulty with self-care will improve Outcome: Progressing Goal: Ability to communicate needs accurately will improve Outcome: Progressing

## 2022-11-07 NOTE — Progress Notes (Signed)
PROGRESS NOTE   Subjective/Complaints:  Still with RLE spasticity.   ROS: Limited due to cognitive/behavioral   Objective:   No results found. No results for input(s): "WBC", "HGB", "HCT", "PLT" in the last 72 hours.  Recent Labs    11/05/22 0715  NA 139  K 4.3  CL 107  CO2 20*  GLUCOSE 97  BUN 26*  CREATININE 4.83*  CALCIUM 8.2*    Intake/Output Summary (Last 24 hours) at 11/07/2022 0859 Last data filed at 11/07/2022 0826 Gross per 24 hour  Intake 974 ml  Output 1000 ml  Net -26 ml        Physical Exam: Vital Signs Blood pressure (!) 163/74, pulse 91, temperature 98.5 F (36.9 C), temperature source Oral, resp. rate 18, height 5\' 6"  (1.676 m), weight 66.5 kg, SpO2 99 %.   Constitutional: No distress . Vital signs reviewed. HEENT: NCAT, EOMI, oral membranes moist Neck: supple Cardiovascular: RRR without murmur. No JVD    Respiratory/Chest: CTA Bilaterally without wheezes or rales. Normal effort    GI/Abdomen: BS +, non-tender, non-distended Ext: no clubbing, cyanosis, or edema Psych: flat Skin: Skin is warm and dry. +RUE IV  Neuro: + Mild dysarthria, as well as some espressive aphasia- sentence level - stable. AAOx3. Follows all commands.     PE from prior encounter:  RUE- proximal 3/5; distal 2/5 LUE 5-/5 in same muscles RLE- HF 3/5; DF/PF 2/5- pt couldn't understand to test KE/KF LLE- 5-/5 in same muscles- KE/KF NT   Tone increased MAS 3 in R elbow and knee flexors, mainly brachioradialis and medial hamstrings --no change    Assessment/Plan: 1. Functional deficits which require 3+ hours per day of interdisciplinary therapy in a comprehensive inpatient rehab setting. Physiatrist is providing close team supervision and 24 hour management of active medical problems listed below. Physiatrist and rehab team continue to assess barriers to discharge/monitor patient progress toward functional and  medical goals  Care Tool:  Bathing    Body parts bathed by patient: Chest, Abdomen, Front perineal area, Right upper leg, Left upper leg, Right lower leg, Left lower leg, Face, Right arm   Body parts bathed by helper: Left arm     Bathing assist Assist Level: Moderate Assistance - Patient 50 - 74%     Upper Body Dressing/Undressing Upper body dressing   What is the patient wearing?: Pull over shirt, Hospital gown only    Upper body assist Assist Level: Moderate Assistance - Patient 50 - 74%    Lower Body Dressing/Undressing Lower body dressing      What is the patient wearing?: Incontinence brief     Lower body assist Assist for lower body dressing: Maximal Assistance - Patient 25 - 49%     Toileting Toileting    Toileting assist Assist for toileting: Independent with assistive device Assistive Device Comment: urinal   Transfers Chair/bed transfer  Transfers assist  Chair/bed transfer activity did not occur: Safety/medical concerns  Chair/bed transfer assist level: Moderate Assistance - Patient 50 - 74% Chair/bed transfer assistive device: Armrests, Bedrails   Locomotion Ambulation   Ambulation assist   Ambulation activity did not occur: N/A (pt has not ambulated in  3 years, uses w/c for mobility baseline)          Walk 10 feet activity   Assist           Walk 50 feet activity   Assist           Walk 150 feet activity   Assist           Walk 10 feet on uneven surface  activity   Assist Walk 10 feet on uneven surfaces activity did not occur: N/A         Wheelchair     Assist Is the patient using a wheelchair?: Yes Type of Wheelchair: Manual    Wheelchair assist level: Moderate Assistance - Patient 50 - 74% Max wheelchair distance: 80    Wheelchair 50 feet with 2 turns activity    Assist        Assist Level: Moderate Assistance - Patient 50 - 74%   Wheelchair 150 feet activity     Assist       Assist Level: Maximal Assistance - Patient 25 - 49%   Blood pressure (!) 163/74, pulse 91, temperature 98.5 F (36.9 C), temperature source Oral, resp. rate 18, height 5\' 6"  (1.676 m), weight 66.5 kg, SpO2 99 %.  Medical Problem List and Plan: 1. Functional deficits secondary to L MCA stroke with R sided weakness in setting of prior R hemiparesis Spasticity R elbow and knee flexors will consider  botox             -patient may  shower             -ELOS/Goals: 12- 14 days supervision   -con't CIR-PT, OT and SLP   2.  Antithrombotics: -DVT/anticoagulation:  Pharmaceutical: Lovenox             -antiplatelet therapy: ASA 3. Pain Management: Tylenol prn.  Bilateral foot pain worse at night , no hx of DM but sx and exam most consistent with neuropathic pain , will trial gabapentin low dose  4. Mood/Behavior/Sleep: LCSW to follow for evaluation and support.              --trazodone prn for insomnia.              -antipsychotic agents: N/A  5. Neuropsych/cognition: This patient is capable of making decisions on his own behalf. 6. Skin/Wound Care: Routine pressure relief measures. And skin /wound care due to incontinence 7. Fluids/Electrolytes/Nutrition: Monitor I/O. Check CMET in am.             --continue IVF and bicarb for now - remains on maintenance 75 cc/hr - DC maintenance, encourage PO 11/23; goal to stabilize to remove PO bicarb and re-initiate ACE - appreciate Nephro note will ask f/u question on those issues   8. HTN: Monitor BP TID--continues to be labile. On hydralazine and amlodipine             --off ace due to acute on chronic renal failure  11/22- BP a little elevated- will monitor for trend and then change meds as required    11/07/2022    8:36 AM 11/07/2022    3:20 AM 11/07/2022    3:03 AM  Vitals with BMI  Weight  146 lbs 10 oz   BMI  96.78   Systolic 938  101  Diastolic 74  71  Pulse 91  76               11/23: Consistently 150-170 SBP; increase  Hydralazine to  50 mg BID              11/24: HTN remains uncontrolled. Cr and BUN has remained stable last several checks; DC IVF, encourage PO, if stable over 1-2 days will re-initiate ACE               11/25: HTN ongoing - hydralazine to 75 mg BID    12/1 fair control--no changes today 9. Acute on chronic renal failure (baseline 2.0-2.3 2021): Continue IVF. Strict I/O.  --SCr down from 6.4-->4.27-> 4.5->4.3   11/22- Cr up somewhat to 4.53 from 4.27- had been better last 3 days, however pt says not drinking well- will push fluids and recheck in AM              11/23: 1,500 IVF yesterday, PO 480 cc PO yesterday, Cr 4.3 today. Continue on PO and repeat in AM.               11/24: PO remains poor, patient remains on 75 cc/hr maintenance IVF, Cr and BUN stable. DC IVF today and recheck next 3 days.               11/25: Encourage PO one additional day; initiate timed toiletting so patient does not avoid fluids d/t incontinence. Labs in AM.     Latest Ref Rng & Units 11/05/2022    7:15 AM 11/03/2022    6:59 AM 11/02/2022    5:52 AM  BMP  Glucose 70 - 99 mg/dL 97  95  93   BUN 8 - 23 mg/dL 26  25  26    Creatinine 0.61 - 1.24 mg/dL 4.83  4.70  4.69   Sodium 135 - 145 mmol/L 139  137  139   Potassium 3.5 - 5.1 mmol/L 4.3  4.3  4.2   Chloride 98 - 111 mmol/L 107  105  105   CO2 22 - 32 mmol/L 20  20  20    Calcium 8.9 - 10.3 mg/dL 8.2  7.9  7.9      9. E coli UTI: completed abx             --monitor voiding with PVR/Bladder scan 10. Anemia fo chronic disease: Drop in Hgb due to hydration             --continue iron supplement.  11. Vitamin D deficiency: Started on ergocalciferol on 11/15 12.  Hypocalcemia: Ca 8.1 w/ alb2.7-->continue supplement as corrects to almost normal.               -- Ca stable 8.1-7.7 11/23  13. Questionable Schizophrenia/Psych Hx: CIR note from 2010 reviewed.  14. Spasticity due to L MCA infarct- focal affecting elbow and knee flexors Xeomin injection today  After verbal consent  Xeomin diluted with preservative free NS Lot #[id number] exp 1/25, [id number] exp 1/25 50u per ml R biceps 50U R brachiorad 50U Right hamstrings 250 U Waste 50U Pt tolerated procedure well No post procedure restrictions Expect onset time ~1wk 12/2-still with significant right sided hypertonia 15. Incontinent of bowel and bladder- has condom cath and using brief-will monitor skin closely.              -have changed senna S to prn which shou8ld help firm up stools and may improve bowel incontinence  12/2 large formed stool yesterday 16. Low Mg  11/22- Up to 2.1- con't to monitor weekly and replete as required- appreciate nephro assist  LOS: 11 days A FACE TO FACE EVALUATION WAS PERFORMED  Meredith Staggers 11/07/2022, 8:59 AM

## 2022-11-08 MED ORDER — METOPROLOL TARTRATE 50 MG PO TABS
50.0000 mg | ORAL_TABLET | Freq: Two times a day (BID) | ORAL | Status: DC
  Administered 2022-11-08 – 2022-11-12 (×8): 50 mg via ORAL
  Filled 2022-11-08 (×8): qty 1

## 2022-11-08 NOTE — Progress Notes (Signed)
Speech Language Pathology Daily Session Note  Patient Details  Name: Daniel [name] MRN: [id number] Date of Birth: [birth year]  Today's Date: 11/08/2022 SLP Individual Time: 1500-1530 SLP Individual Time Calculation (min): 30 min  Short Term Goals: Week 2: SLP Short Term Goal 1 (Week 2): Pt will consume current diet with improved mastication, oral manipulation, minimal oral residuals, and minimal overt s/sx of aspiration with sup A cues for implementation of swallowing precautions/strategies SLP Short Term Goal 2 (Week 2): Patient will implement speech intelligibility strategies at the word and short sentence level with 75% intelligiblity with mod A for implementation of speech intelligibility strategies SLP Short Term Goal 3 (Week 2): Patient will recall and demonstrate use of memory compensations to increase independence and safety at home with mod A verbal cues. SLP Short Term Goal 4 (Week 2): Patient will demonstrate functional problem solving for basic and familiar tasks with Mod A verbal cues.  Skilled Therapeutic Interventions:   Pt was seen for skilled ST targeting goals for dysphagia and communication.  Pt was asleep upon arrival but awakened easily to voice and was agreeable to participating in treatment.  Pt remained  lethargic throughout treatment session but as session progressed his alertness appeared to improve with a subsequent appreciable improvement in speech intelligibility.  SLP facilitated the session with a trial snack of dys 3 textures to continue working towards diet progression.  Pt had prolonged mastication of advanced solids with mod cues needed to monitor and correct diffuse oral residue.  No overt s/s of aspiration were evident with solids or liquids.  Recommend that pt remain on his currently prescribed diet with trials of advanced consistencies with SLP.  During conversations with SLP, pt required mod faded to min verbal cues for increased vocal intensity and  overarticulation to achieve intelligibility at the phrase to short sentence level.  Pt stated that he at times gets frustrated with the way his speech sounds and how often he has to repeat himself to his caregivers.  Pt was left in bed with bed alarm set and call bell within reach.  Continue per current plan of care.    Pain Pain Assessment Pain Scale: 0-10 Pain Score: 0-No pain  Therapy/Group: Individual Therapy  Senaya Dicenso, Selinda Orion 11/08/2022, 4:01 PM

## 2022-11-08 NOTE — Plan of Care (Signed)
  Problem: Consults Goal: RH STROKE PATIENT EDUCATION Description: See Patient Education module for education specifics  Outcome: Progressing   Problem: RH BOWEL ELIMINATION Goal: RH STG MANAGE BOWEL WITH ASSISTANCE Description: STG Manage Bowel with mod I Assistance. Outcome: Progressing Goal: RH STG MANAGE BOWEL W/MEDICATION W/ASSISTANCE Description: STG Manage Bowel with Medication with Assistance. Outcome: Progressing   Problem: RH BLADDER ELIMINATION Goal: RH STG MANAGE BLADDER WITH ASSISTANCE Description: STG Manage Bladder With mod I  Assistance Outcome: Progressing Goal: RH STG MANAGE BLADDER WITH MEDICATION WITH ASSISTANCE Description: STG Manage Bladder With Medication With mod I Assistance. Outcome: Progressing   Problem: RH SAFETY Goal: RH STG ADHERE TO SAFETY PRECAUTIONS W/ASSISTANCE/DEVICE Description: STG Adhere to Safety Precautions With cues Assistance/Device. Outcome: Progressing   Problem: RH PAIN MANAGEMENT Goal: RH STG PAIN MANAGED AT OR BELOW PT'S PAIN GOAL Description: < 4 with prns Outcome: Progressing   Problem: RH KNOWLEDGE DEFICIT Goal: RH STG INCREASE KNOWLEDGE OF HYPERTENSION Description: Patient and sister will be able to manage HTN with medications and dietary modifications using educational handouts interdependently Outcome: Progressing Goal: RH STG INCREASE KNOWLEGDE OF HYPERLIPIDEMIA Description: Patient and sister will be able to manage HLD with medications and dietary modifications using educational handouts interdependently Outcome: Progressing Goal: RH STG INCREASE KNOWLEDGE OF STROKE PROPHYLAXIS Description: Patient and sister will be able to manage secondary risks with medications and dietary modifications using educational handouts interdependently Outcome: Progressing   Problem: Education: Goal: Knowledge of disease or condition will improve Outcome: Progressing Goal: Knowledge of secondary prevention will improve (MUST DOCUMENT  ALL) Outcome: Progressing Goal: Knowledge of patient specific risk factors will improve Elta [place] N/A or DELETE if not current risk factor) Outcome: Progressing   Problem: Ischemic Stroke/TIA Tissue Perfusion: Goal: Complications of ischemic stroke/TIA will be minimized Outcome: Progressing   Problem: Coping: Goal: Will verbalize positive feelings about self Outcome: Progressing Goal: Will identify appropriate support needs Outcome: Progressing   Problem: Health Behavior/Discharge Planning: Goal: Ability to manage health-related needs will improve Outcome: Progressing Goal: Goals will be collaboratively established with patient/family Outcome: Progressing   Problem: Self-Care: Goal: Ability to participate in self-care as condition permits will improve Outcome: Progressing Goal: Verbalization of feelings and concerns over difficulty with self-care will improve Outcome: Progressing Goal: Ability to communicate needs accurately will improve Outcome: Progressing

## 2022-11-08 NOTE — Progress Notes (Signed)
Occupational Therapy Session Note  Patient Details  Name: Daniel [name] MRN: [id number] Date of Birth: [birth year]  Today's Date: 11/08/2022 OT Individual Time: 1345-1445 OT Individual Time Calculation (min): 60 min    Short Term Goals: Week 2:  OT Short Term Goal 1 (Week 2): Pt will maintain dynamic seated balance during BADLs with SBA OT Short Term Goal 2 (Week 2): Pt will complete LB dressing min A OT Short Term Goal 3 (Week 2): Pt will complete UB dressing with min A OT Short Term Goal 4 (Week 2): Pt will complete toileting mod A  Skilled Therapeutic Interventions/Progress Updates:    Upon OT arrival, pt semi recumbent stating "My legs hurt". Pt agreeable to OT session. Treatment intervention with a focus on self care retraining, sitting balance, and R UE NMR. Pt completes supine to sit transfer with CGA and donns pants with Max A requiring increased time and verbal cues. Pt completes sitting EOB with anywhere from [place]aine A-CGA. Pt completes stand pivot transfer into TIS with Mod A and was transported to nurses station to receive pain medication with setup assist. Pt was transported to dayroom gym via w/c and sits at tabletop to thread wooden beads onto string the L UE while stabilizing beads with the R UE. Pt requires mod A to assist with stabilizing but demos active shoulder flexion when provided verbal cues. Pt removes half of beads from string to return to bucket and was transported back to his room via w/c and total A. Pt completes stand pivot transfer to bed with Mod A, doffs pants with total A secondary to time constraints and performs sit to supine transfer with Mod A. Pt was positioned to L side and was left in bed with all needs met and safety measures in place.   Therapy Documentation Precautions:  Precautions Precautions: Fall, Other (comment) Precaution Comments: acute on chronic R hemiparesis with R UE and LE posturing/contractures Restrictions Weight Bearing Restrictions:  No  Therapy/Group: Individual Therapy  Marvetta Gibbons 11/08/2022, 2:25 PM

## 2022-11-09 DIAGNOSIS — E559 Vitamin D deficiency, unspecified: Secondary | ICD-10-CM | POA: Insufficient documentation

## 2022-11-09 LAB — BASIC METABOLIC PANEL
Anion gap: 12 (ref 5–15)
BUN: 29 mg/dL — ABNORMAL HIGH (ref 8–23)
CO2: 19 mmol/L — ABNORMAL LOW (ref 22–32)
Calcium: 8.2 mg/dL — ABNORMAL LOW (ref 8.9–10.3)
Chloride: 107 mmol/L (ref 98–111)
Creatinine, Ser: 4.91 mg/dL — ABNORMAL HIGH (ref 0.61–1.24)
GFR, Estimated: 11 mL/min — ABNORMAL LOW (ref >60)
Glucose, Bld: 92 mg/dL (ref 70–99)
Potassium: 4.4 mmol/L (ref 3.5–5.1)
Sodium: 138 mmol/L (ref 135–145)

## 2022-11-09 LAB — CBC
HCT: 24.7 % — ABNORMAL LOW (ref 39.0–52.0)
Hemoglobin: 8 g/dL — ABNORMAL LOW (ref 13.0–17.0)
MCH: 29.4 pg (ref 26.0–34.0)
MCHC: 32.4 g/dL (ref 30.0–36.0)
MCV: 90.8 fL (ref 80.0–100.0)
Platelets: 319 10*3/uL (ref 150–400)
RBC: 2.72 MIL/uL — ABNORMAL LOW (ref 4.22–5.81)
RDW: 13.8 % (ref 11.5–15.5)
WBC: 4.1 10*3/uL (ref 4.0–10.5)
nRBC: 0 % (ref 0.0–0.2)

## 2022-11-09 MED ORDER — SODIUM BICARBONATE 650 MG PO TABS
1300.0000 mg | ORAL_TABLET | Freq: Two times a day (BID) | ORAL | Status: DC
  Administered 2022-11-09 – 2022-11-12 (×6): 1300 mg via ORAL
  Filled 2022-11-09 (×6): qty 2

## 2022-11-09 MED ORDER — ORAL CARE MOUTH RINSE: 15.0000 mL | Freq: Three times a day (TID) | OROMUCOSAL | 0 refills | Status: DC

## 2022-11-09 MED ORDER — SENNOSIDES-DOCUSATE SODIUM 8.6-50 MG PO TABS: 1.0000 | ORAL_TABLET | Freq: Every evening | ORAL | Status: AC | PRN

## 2022-11-09 MED ORDER — HYDRALAZINE HCL 25 MG PO TABS: 75.0000 mg | ORAL_TABLET | Freq: Three times a day (TID) | ORAL | Status: DC

## 2022-11-09 MED ORDER — METOPROLOL TARTRATE 50 MG PO TABS: 50.0000 mg | ORAL_TABLET | Freq: Two times a day (BID) | ORAL | Status: DC

## 2022-11-09 MED ORDER — MAGNESIUM GLUCONATE 500 MG PO TABS: 250.0000 mg | ORAL_TABLET | Freq: Every day | ORAL | Status: AC

## 2022-11-09 MED ORDER — NYSTATIN 100000 UNIT/ML MT SUSP: 5.0000 mL | Freq: Four times a day (QID) | OROMUCOSAL | 0 refills | Status: DC

## 2022-11-09 MED ORDER — SIMETHICONE 80 MG PO CHEW: 80.0000 mg | CHEWABLE_TABLET | Freq: Four times a day (QID) | ORAL | 0 refills | Status: AC | PRN

## 2022-11-09 MED ORDER — GABAPENTIN 100 MG PO CAPS: 100.0000 mg | ORAL_CAPSULE | Freq: Every day | ORAL | Status: DC

## 2022-11-09 MED ORDER — ACETAMINOPHEN 325 MG PO TABS: 325.0000 mg | ORAL_TABLET | ORAL | Status: AC | PRN

## 2022-11-09 MED ORDER — SODIUM BICARBONATE 650 MG PO TABS: 1300.0000 mg | ORAL_TABLET | Freq: Two times a day (BID) | ORAL | Status: DC

## 2022-11-09 MED ORDER — ATORVASTATIN CALCIUM 40 MG PO TABS: 40.0000 mg | ORAL_TABLET | Freq: Every day | ORAL | 3 refills | Status: DC

## 2022-11-09 NOTE — Discharge Summary (Incomplete Revision)
Physician Discharge Summary  Patient ID: Daniel [name] MRN: [id number] DOB/AGE: 1946/12/01 76 y.o.  Admit date: 10/27/2022 Discharge date: 11/09/2022  Discharge Diagnoses:  Principal Problem:   Acute ischemic left middle cerebral artery (MCA) stroke (HCC) Active Problems:   Anemia   HTN (hypertension)   Hemiplegia, late effect of cerebrovascular disease (HCC)   Peripheral vascular disease (HCC)   Acute kidney injury superimposed on chronic kidney disease ([place])   Vitamin D deficiency   Hypomagnesemia   Discharged Condition: stable  Significant Diagnostic Studies: N/A   Labs:  Basic Metabolic Panel: Recent Labs  Lab 11/03/22 0659 11/05/22 0715 11/07/22 0913 11/09/22 0704  NA 137 139 136 138  K 4.3 4.3 4.2 4.4  CL 105 107 107 107  CO2 20* 20* 18* 19*  GLUCOSE 95 97 134* 92  BUN 25* 26* 25* 29*  CREATININE 4.70* 4.83* 4.81* 4.91*  CALCIUM 7.9* 8.2* 7.9* 8.2*  PHOS 4.1 3.8 3.5  --     CBC:    Latest Ref Rng & Units 11/09/2022    7:04 AM 11/02/2022    5:52 AM 10/28/2022    5:49 AM  CBC  WBC 4.0 - 10.5 K/uL 4.1  4.7  6.0   Hemoglobin 13.0 - 17.0 g/dL 8.0  8.5  8.6   Hematocrit 39.0 - 52.0 % 24.7  24.6  26.2   Platelets 150 - 400 K/uL 319  304  310      CBG: No results for input(s): "GLUCAP" in the last 168 hours.  Brief HPI:   Daniel [name] is a 76 y.o. male left handed male with history of HTN, prior stroke with residual R-HP, CKD with baseline SCr- 2.0-2.3 in the past, BPH was admitted on 10/20/2022 with right facial droop, slurred speech, acute on chronic renal failure with serum creatinine up to 5.8 and mild metabolic acidosis with hypokalemia.  Per reports patient had multiple falls with worsening of weak this 2 days prior to admission.  MRI brain done revealing acute ischemia in left MCA territory and right greater than left SDH with generalized volume loss and encephalomalacia and prior left ACA infarct MRA showed occlusion of left ICA proximal to the  skull base with diminished flow and has MCA.  Doppler showed total occlusion of the with greater than 50% stenosis.  Neurology felt that carotid stenosis had progressed to total occlusion and was likely the cause of his stroke.  Neurosurgery/Dr. Marcello Moores was consulted for input and felt surgical intervention not needed and recommended avoiding DAPT but felt aspirin alone was likely safe.  CT head was repeated and noted to be stable therefore ASA added on 11/17.  Patient treated with bicarb drip with improvement in metabolic acidosis as well as poor potassium supplementation.  He did have urinary retention requiring Foley placement.  Dr. Carolin Sicks with nephrology was consulted for input renal status and recommended fluid bolus monitoring of magnesium level.  RML infiltrate as well as E coli UTI treated with IV ceftriaxone and intermittent fevers were resolving. Hypocalcemia corrects to near normal and phosphate level normal with recommendations to continue sodium bicarb till serum bicarb > 24. Therapy was working with patient who continued to be limited by weakness with  cognitive deficits. CIR was recommended due to functional decline.    Hospital Course: Daniel [name] was admitted to rehab 10/27/2022 for inpatient therapies to consist of PT, ST and OT at least three hours five days a week. Past admission physiatrist, therapy team and rehab  RN have worked together to provide customized collaborative inpatient rehab. Here blood pressures were monitored on TID basis and medications were titrated for better control.  He continued to have acute on chronic renal failure and renal ultrasound showed chronic findings with volume loss.  IV fluids were resumed and nephrology was consulted for input due to recurrent worsening.  Dr. Royce Macadamia felt patient with CKD stage IV and recommended strict I/O, encourage fluid intake and supportive care as patient felt to be poor candidate for dialysis but this was discussed with his  sister who felt that patient would not want HD.  Fluid intake has been encouraged with BUN/serum creatinine trending upwards. Bicarb increased to 1300 mg bid prior to discharge.  Recommend continued monitoring of renal status weekly and follow-up with nephrology after discharge for further input.  Anemia due to chronic kidney disease was treated with IV iron x 2 doses.  ESA deferred due to recent stroke.  He is tolerating dysphagia 2 diet without difficulty. Mood has been stable but he continues to make slow progress due to weakness and cognitive deficits. Family has elected on SNF for progressive therapy . He was discharged to [facility] on 11/09/22   Rehab course: During patient's stay in rehab weekly team conferences were held to monitor patient's progress, set goals and discuss barriers to discharge. At admission, patient required assist with basic ADL task and safety.  He exhibited significant cognitive linguistic deficits with moderate dysarthria, suspicion of anomia and mild to moderate oral dysphagia.  He  has had improvement in activity tolerance, balance, postural control as well as ability to compensate for deficits. He is able to complete ADL tasks with contact-guard to max assist. He requires max verbal cues for recall of speech intelligibility strategies and for effective problem-solving.  Disposition: Skilled Nursing Facility  Diet: Dysphagia 2, thins, Low salt.     Special Instructions: Monitor weights daily to monitor for any signs of overload.  Strict I/O. Recheck BMET and CBC weekly.   Allergies as of 11/09/2022   No Known Allergies      Medication List     STOP taking these medications    magnesium oxide 400 (240 Mg) MG tablet Commonly known as: MAG-OX       TAKE these medications    acetaminophen 325 MG tablet Commonly known as: TYLENOL Take 1-2 tablets (325-650 mg total) by mouth every 4 (four) hours as needed for mild pain.   amLODipine 10 MG  tablet Commonly known as: NORVASC TAKE 1 TABLET BY MOUTH EVERY DAY   aspirin EC 81 MG tablet Take 1 tablet (81 mg total) by mouth daily. Swallow whole.   atorvastatin 40 MG tablet Commonly known as: LIPITOR Take 1 tablet (40 mg total) by mouth daily.   calcium carbonate 1250 (500 Ca) MG tablet Commonly known as: OS-CAL - dosed in mg of elemental calcium Take 1 tablet (1,250 mg total) by mouth 2 (two) times daily with a meal.   eucerin lotion Apply 1 Application topically as needed for dry skin.   ferrous sulfate 325 (65 FE) MG tablet Take 1 tablet (325 mg total) by mouth daily with breakfast.   folic acid 1 MG tablet Commonly known as: FOLVITE TAKE 1 TABLET BY MOUTH EVERY DAY   gabapentin 100 MG capsule Commonly known as: NEURONTIN Take 1 capsule (100 mg total) by mouth at bedtime.   hydrALAZINE 25 MG tablet Commonly known as: APRESOLINE Take 3 tablets (75 mg total) by mouth  every 8 (eight) hours. What changed:  how much to take when to take this   magnesium gluconate 500 MG tablet Commonly known as: MAGONATE Take 0.5 tablets (250 mg total) by mouth daily. Start taking on: November 10, 2022   metoprolol tartrate 50 MG tablet Commonly known as: LOPRESSOR Take 1 tablet (50 mg total) by mouth 2 (two) times daily.   mouth rinse Liqd solution 15 mLs by Mouth Rinse route 4 (four) times daily - after meals and at bedtime.   nystatin 100000 UNIT/ML suspension Commonly known as: MYCOSTATIN Take 5 mLs (500,000 Units total) by mouth 4 (four) times daily.   senna-docusate 8.6-50 MG tablet Commonly known as: Senokot-S Take 1 tablet by mouth at bedtime as needed for mild constipation. What changed:  when to take this reasons to take this   simethicone 80 MG chewable tablet Commonly known as: MYLICON Chew 1 tablet (80 mg total) by mouth 4 (four) times daily as needed for flatulence.   sodium bicarbonate 650 MG tablet Take 1 tablet (650 mg total) by mouth 3 (three)  times daily.   tamsulosin 0.4 MG Caps capsule Commonly known as: FLOMAX TAKE 1 CAPSULE BY MOUTH EVERY DAY   Vitamin D (Ergocalciferol) 1.25 MG (50000 UNIT) Caps capsule Commonly known as: DRISDOL Take 1 capsule (50,000 Units total) by mouth every 7 (seven) days.        Contact information for after-discharge care     Destination     [facility] Preferred SNF .   Service: Skilled Nursing Contact information: [address] [place] [phone]                     Signed: Bary Leriche 11/09/2022, 12:12 PM

## 2022-11-09 NOTE — Progress Notes (Signed)
Inpatient Rehabilitation Discharge Medication Review by a Pharmacist  A complete drug regimen review was completed for this patient to identify any potential clinically significant medication issues.  High Risk Drug Classes Is patient taking? Indication by Medication  Antipsychotic No   Anticoagulant No   Antibiotic No   Opioid No   Antiplatelet Yes ASA - CVA ppx  Hypoglycemics/insulin No   Vasoactive Medication Yes Metoprolol tartrate, hydralazine - HTN  Chemotherapy No   Other Yes Atorvastatin - HLD Nystatin - fungal prevention Simethicone - PRN flatulence Gabapentin - neuropathy/pain Senokot-S - PRN constipation Magnesium gluconate, sodium bicarbonate, vitamin D - supplement Tamsulosin - urinary retention     Type of Medication Issue Identified Description of Issue Recommendation(s)  Drug Interaction(s) (clinically significant)     Duplicate Therapy     Allergy     No Medication Administration End Date     Incorrect Dose     Additional Drug Therapy Needed     Significant med changes from prior encounter (inform family/care partners about these prior to discharge).    Other       Clinically significant medication issues were identified that warrant physician communication and completion of prescribed/recommended actions by midnight of the next day:  No  Pharmacist comments: n/a  Time spent performing this drug regimen review (minutes): 20   Thank you for allowing pharmacy to be a part of this patient's care.  Thelma Barge, PharmD Clinical Pharmacist

## 2022-11-09 NOTE — Progress Notes (Incomplete)
Occupational Therapy Discharge Summary  Patient Details  Name: Daniel [name] MRN: [id number] Date of Birth: 04-10-1946  Date of Discharge from OT service:{Time; dates multiple:304500300}  {CHL IP REHAB OT TIME CALCULATIONS:304400400}   Patient has met {NUMBERS 0-12:18577} of {NUMBERS 0-12:18577} long term goals due to {due JA:2505397}.  Patient to discharge at overall {LOA:3049010} level.  Patient's care partner {care partner:3041650} to provide the necessary {assistance:3041652} assistance at discharge.    Reasons goals not met: ***  Recommendation:  Patient will benefit from ongoing skilled OT services in {setting:3041680} to continue to advance functional skills in the area of {ADL/iADL:3041649}.  Equipment: {equipment:3041657}  Reasons for discharge: {Reason for discharge:3049018}  Patient/family agrees with progress made and goals achieved: {Pt/Family agree with progress/goals:3049020}  OT Discharge Precautions/Restrictions  Precautions Precautions: Fall;Other (comment) Precaution Comments: acute on chronic R hemiparesis with R UE and LE posturing/contractures Restrictions Weight Bearing Restrictions: No General   Vital Signs Therapy Vitals Temp: 97.9 F (36.6 C) Pulse Rate: 71 Resp: 14 BP: 134/83 Patient Position (if appropriate): Sitting Oxygen Therapy SpO2: 100 % O2 Device: Room Air Pain Pain Assessment Pain Scale: 0-10 Pain Score: 0-No pain ADL ADL Eating: Supervision/safety Where Assessed-Eating: Bed level Grooming: Setup, Moderate cueing Where Assessed-Grooming: Sitting at sink Upper Body Bathing: Minimal assistance Where Assessed-Upper Body Bathing: Edge of bed Lower Body Bathing: Moderate assistance Where Assessed-Lower Body Bathing: Edge of bed Upper Body Dressing: Minimal assistance Where Assessed-Upper Body Dressing: Edge of bed Lower Body Dressing: Moderate assistance Where Assessed-Lower Body Dressing: Edge of bed Toileting: Setup,  Minimal assistance (Pt able to void with set-up A in urinal bed level; requires min-mod A for posterior peri-care and clothing management when using BSC) Where Assessed-Toileting: Bed level, Bedside Commode Toilet Transfer: Minimal assistance Toilet Transfer Method: Squat pivot Toilet Transfer Equipment: Geophysical data processor: Moderate assistance Social research officer, government Method: Engineer, agricultural with back Vision Baseline Vision/History: 0 No visual deficits Patient Visual Report: No change from baseline Vision Assessment?: Yes Eye Alignment: Within Functional Limits Ocular Range of Motion: Restricted on the right Alignment/Gaze Preference: Chin down;Gaze left Tracking/Visual Pursuits: Requires cues, head turns, or add eye shifts to track;Decreased smoothness of horizontal tracking Saccades: Decreased speed of saccadic movement;Additional head turns occurred during testing Convergence: Within functional limits Visual Fields: Right visual field deficit Perception  Perception: Impaired Inattention/Neglect: Does not attend to right visual field Praxis Praxis: Impaired Praxis Impairment Details: Motor planning Cognition Cognition Overall Cognitive Status: Impaired/Different from baseline Arousal/Alertness: Awake/alert Orientation Level: Person;Situation Memory: Impaired Memory Impairment: Storage deficit;Decreased recall of new information;Retrieval deficit Attention: Focused;Sustained;Selective Focused Attention: Appears intact Sustained Attention: Impaired Sustained Attention Impairment: Verbal basic Selective Attention: Impaired Selective Attention Impairment: Verbal basic;Functional basic Awareness: Impaired Awareness Impairment: Intellectual impairment Problem Solving: Impaired Problem Solving Impairment: Verbal basic;Functional basic Executive Function: Reasoning;Decision Making;Self  Correcting;Sequencing;Initiating;Organizing;Self Monitoring Reasoning: Impaired Sequencing: Impaired Organizing: Impaired Decision Making: Impaired Initiating: Impaired Self Monitoring: Impaired Self Correcting: Impaired Safety/Judgment: Impaired Brief Interview for Mental Status (BIMS) Repetition of Three Words (First Attempt): 3 Temporal Orientation: Year: Correct Temporal Orientation: Month: Accurate within 5 days Temporal Orientation: Day: Correct Recall: "Sock": No, could not recall Recall: "Blue": Yes, no cue required Recall: "Bed": No, could not recall BIMS Summary Score: 11 Sensation Sensation Light Touch: Impaired Detail Central sensation comments: unable to feel touch in R LE and impaired in L LE Light Touch Impaired Details: Absent RLE;Impaired LLE Hot/Cold: Not tested Proprioception: Impaired Detail Proprioception Impaired Details: Impaired RLE;Impaired LLE Stereognosis:  Not tested Coordination Gross Motor Movements are Fluid and Coordinated: No Fine Motor Movements are Fluid and Coordinated: No Coordination and Movement Description: impaired due to acute on chronic R hemiplegia and with flexion contractures Motor  Motor Motor: Hemiplegia;Abnormal tone;Abnormal postural alignment and control Motor - Skilled Clinical Observations: Impaired d/t acute on chronic R hemiplegia with flexion contractures Mobility  Bed Mobility Bed Mobility: Supine to Sit;Sit to Supine Rolling Right: Supervision/verbal cueing Rolling Left: Minimal Assistance - Patient > 75%;Supervision/Verbal cueing Supine to Sit: Contact Guard/Touching assist Sit to Supine: Minimal Assistance - Patient > 75% Transfers Sit to Stand: Contact Guard/Touching assist;Minimal Assistance - Patient > 75% Stand to Sit: Minimal Assistance - Patient > 75%;Contact Guard/Touching assist  Trunk/Postural Assessment  Cervical Assessment Cervical Assessment: Exceptions to [facility] (cervical rotation to L with rounded  shoulders) Thoracic Assessment Thoracic Assessment: Exceptions to [facility] (rounded shoudlers, scoliosis, uneven shoulders) Lumbar Assessment Lumbar Assessment: Exceptions to [facility] (pelvic obliquity with windswept legs) Postural Control Postural Control: Deficits on evaluation Trunk Control: fai r Righting Reactions: Impaired Protective Responses: Impaired Postural Limitations: decreased  Balance Balance Balance Assessed: Yes Static Sitting Balance Static Sitting - Balance Support: Feet supported;Left upper extremity supported Static Sitting - Level of Assistance: 5: Stand by assistance Dynamic Sitting Balance Dynamic Sitting - Balance Support: Feet supported;Left upper extremity supported Dynamic Sitting - Level of Assistance: 5: Stand by assistance Dynamic Sitting Balance - Compensations: Pulling with LUE using bed features Static Standing Balance Static Standing - Balance Support: During functional activity;Bilateral upper extremity supported Static Standing - Level of Assistance: 4: Min assist;5: Stand by assistance Extremity/Trunk Assessment RUE Assessment RUE Assessment: Exceptions to [facility] Active Range of Motion (AROM) Comments: distal 2-/5, triceps 2+/5, biceps 3/5, shoudler protraction/retraction 2+/5 RUE Body System: Neuro Brunstrum levels for arm and hand: Arm;Hand Brunstrum level for arm: Stage III Synergy is performed voluntarily Brunstrum level for hand: Stage II Synergy is developing RUE AROM (degrees) Overall AROM Right Upper Extremity: Deficits RUE Overall AROM Comments: Pt demonstrating returning of muscle activation proximal>distal LUE Assessment LUE Assessment: Within Functional Limits Active Range of Motion (AROM) Comments: 5-/5   Janey Genta 11/09/2022, 4:34 PM

## 2022-11-09 NOTE — Progress Notes (Signed)
Occupational Therapy Session Note  Patient Details  Name: Daniel [name] MRN: [id number] Date of Birth: 12/06/1946  Today's Date: 11/09/2022 OT Individual Time: 6967-8938 OT Individual Time Calculation (min): 68 min    Short Term Goals: Week 2:  OT Short Term Goal 1 (Week 2): Pt will maintain dynamic seated balance during BADLs with SBA OT Short Term Goal 2 (Week 2): Pt will complete LB dressing min A OT Short Term Goal 3 (Week 2): Pt will complete UB dressing with min A OT Short Term Goal 4 (Week 2): Pt will complete toileting mod A  Skilled Therapeutic Interventions/Progress Updates:  Pt received supine in bed for skilled OT session with focus on NMR and ADL retraining. Pt agreeable to interventions, demonstrating an overall pleasant mood. Pt with no reports of pain stating. OT continuing to offer intermediate rest breaks and positioning suggestions throughout session to address pain/fatigue and maximize participation/safety in session.   Pt performs sup>EOB with HOB elevated, use of bed rail, and Mod A for trunk elevation. Seated EOB, pt threads scrub pants with education on compensatory techniques and Min A for dynamic seated balance. In standing with Mod A, pt initiates donning of pants over hips/bottom, requiring A to complete.  Pt performs multiple squat-pivot transfers throughout session. Pt requiring Min-Mod A, dependent on fatigue, and minimal verbal cuing to sequence transfers. Pt dependent for transport to therapy gym.   In therapy gym, pt engages in series of RUE ROM exercises with support at R-elbow/wrist. Pt continues to demonstrate minimal muscular activation in distal arm, R-shoulder area outperforming. Pt with sporadic "clonus-like" movements concentrated in R-shoulder and bicep area, during elbow/shoulder flexion/extension.  Seated EOM and in standing, pt engages in activities targeting seated/standing posture (pt with strong kyphosis) and WB through bilateral UES. Pt  requires multimodal cues to reach more "open" posture and extend neck. Pt tolerates trials of standing, lasting ~2 mins, with Mod A x2 for balance and weight-shifting onto bilateral lower extremities.   In room, pt performs oral care with A for item set-up and thoroughness due to severe plaque build-up.   Pt remained seated in [facility] with all immediate needs met and lap belt alarmed at end of session. Pt continues to be appropriate for skilled OT intervention to promote further functional independence.   Therapy Documentation Precautions:  Precautions Precautions: Fall, Other (comment) Precaution Comments: acute on chronic R hemiparesis with R UE and LE posturing/contractures Restrictions Weight Bearing Restrictions: No   Therapy/Group: Individual Therapy  Maudie Mercury, OTR/L, MSOT  11/09/2022, 7:56 AM

## 2022-11-09 NOTE — Progress Notes (Addendum)
Patient ID: Daniel [name], male   DOB: 02/21/1946, 76 y.o.   MRN: [id number]  Patient level E PASRR has expired. Patient new submission has been escalated to level II requiring a nursing manuel review. Patient will not discharge today. Authorization approved through 12/7.

## 2022-11-09 NOTE — Discharge Summary (Addendum)
Physician Discharge Summary  Patient ID: Daniel [name] MRN: [id number] DOB/AGE: 09-01-1946 76 y.o.  Admit date: 10/27/2022 Discharge date: 11/12/2022  Discharge Diagnoses:  Principal Problem:   Acute ischemic left middle cerebral artery (MCA) stroke (HCC) Active Problems:   Anemia   HTN (hypertension)   Hemiplegia, late effect of cerebrovascular disease (HCC)   Peripheral vascular disease (HCC)   Acute kidney injury superimposed on chronic kidney disease (HCC)   Vitamin D deficiency   Hypomagnesemia   Discharged Condition: stable  Significant Diagnostic Studies: N/A   Labs:  Basic Metabolic Panel:    Latest Ref Rng & Units 11/09/2022    7:04 AM 11/07/2022    9:13 AM 11/05/2022    7:15 AM  BMP  Glucose 70 - 99 mg/dL 92  409  97   BUN 8 - 23 mg/dL 29  25  26    Creatinine 0.61 - 1.24 mg/dL 8.11  9.14  7.82   Sodium 135 - 145 mmol/L 138  136  139   Potassium 3.5 - 5.1 mmol/L 4.4  4.2  4.3   Chloride 98 - 111 mmol/L 107  107  107   CO2 22 - 32 mmol/L 19  18  20    Calcium 8.9 - 10.3 mg/dL 8.2  7.9  8.2      CBC:    Latest Ref Rng & Units 11/09/2022    7:04 AM 11/02/2022    5:52 AM 10/28/2022    5:49 AM  CBC  WBC 4.0 - 10.5 K/uL 4.1  4.7  6.0   Hemoglobin 13.0 - 17.0 g/dL 8.0  8.5  8.6   Hematocrit 39.0 - 52.0 % 24.7  24.6  26.2   Platelets 150 - 400 K/uL 319  304  310     . CBG: No results for input(s): "GLUCAP" in the last 168 hours.  Brief HPI:   [name] is a 76 y.o. male left handed male with history of HTN, prior stroke with residual R-HP, CKD with baseline SCr- 2.0-2.3 in the past, BPH was admitted on 10/20/2022 with right facial droop, slurred speech, acute on chronic renal failure with serum creatinine up to 5.8 and mild metabolic acidosis with hypokalemia.  Per reports patient had multiple falls with worsening of weak this 2 days prior to admission.  MRI brain done revealing acute ischemia in left MCA territory and right greater than left SDH with  generalized volume loss and encephalomalacia and prior left ACA infarct MRA showed occlusion of left ICA proximal to the skull base with diminished flow and has MCA.  Doppler showed total occlusion of the with greater than 50% stenosis.  Neurology felt that carotid stenosis had progressed to total occlusion and was likely the cause of his stroke.  Neurosurgery/Dr. Maisie Fus was consulted for input and felt surgical intervention not needed and recommended avoiding DAPT but felt aspirin alone was likely safe.  CT head was repeated and noted to be stable therefore ASA added on 11/17.  Patient treated with bicarb drip with improvement in metabolic acidosis as well as poor potassium supplementation.  He did have urinary retention requiring Foley placement.  Dr. Ronalee Belts with nephrology was consulted for input renal status and recommended fluid bolus monitoring of magnesium level.  RML infiltrate as well as E coli UTI treated with IV ceftriaxone and intermittent fevers were resolving. Hypocalcemia corrects to near normal and phosphate level normal with recommendations to continue sodium bicarb till serum bicarb > 24. Therapy was working with  patient who continued to be limited by weakness with  cognitive deficits. CIR was recommended due to functional decline.    Hospital Course: [name] was admitted to rehab 10/27/2022 for inpatient therapies to consist of PT, ST and OT at least three hours five days a week. Past admission physiatrist, therapy team and rehab RN have worked together to provide customized collaborative inpatient rehab. Here blood pressures were monitored on TID basis and medications were titrated for better control.  He continued to have acute on chronic renal failure and renal ultrasound showed chronic findings with volume loss.  IV fluids were resumed and nephrology was consulted for input due to recurrent worsening.  Dr. Malen Gauze felt patient with CKD stage IV and recommended strict I/O,  encourage fluid intake and supportive care as patient felt to be poor candidate for dialysis but this was discussed with his sister who felt that patient would not want HD.    Fluid intake has been encouraged with BUN/serum creatinine trending upwards. Bicarb increased to 1300 mg bid prior to discharge.  Recommend continued monitoring of renal status weekly and follow-up with nephrology after discharge for further input.  Anemia due to chronic kidney disease was treated with IV iron x 2 doses.  ESA deferred due to recent stroke.  He is tolerating dysphagia 2 diet without difficulty. Mood has been stable but he continues to make slow progress due to weakness and cognitive deficits. Family has elected on SNF for progressive therapy. His LOS was extended by 2 day pending PSSAR update and he was discharged to [facility] on 11/09/22   Rehab course: During patient's stay in rehab weekly team conferences were held to monitor patient's progress, set goals and discuss barriers to discharge. At admission, patient required assist with basic ADL task and safety.  He exhibited significant cognitive linguistic deficits with moderate dysarthria, suspicion of anomia and mild to moderate oral dysphagia.  He  has had improvement in activity tolerance, balance, postural control as well as ability to compensate for deficits. He is able to complete ADL tasks with contact-guard to max assist. He requires max verbal cues for recall of speech intelligibility strategies and for effective problem-solving.  Disposition: Skilled Nursing Facility  Diet: Dysphagia 2, thins, Low salt.     Special Instructions: Monitor weights daily to monitor for any signs of overload.  Strict I/O. Recheck BMET and CBC weekly.  Discharge Instructions     Ambulatory referral to Neurology   Complete by: As directed    An appointment is requested in approximately: 3-4 weeks stroke follow up   Ambulatory referral to Physical Medicine  Rehab   Complete by: As directed    4-6 weeks hospital follow up      Allergies as of 11/12/2022   No Known Allergies      Medication List     STOP taking these medications    magnesium oxide 400 (240 Mg) MG tablet Commonly known as: MAG-OX       TAKE these medications    acetaminophen 325 MG tablet Commonly known as: TYLENOL Take 1-2 tablets (325-650 mg total) by mouth every 4 (four) hours as needed for mild pain.   amLODipine 10 MG tablet Commonly known as: NORVASC TAKE 1 TABLET BY MOUTH EVERY DAY   aspirin EC 81 MG tablet Take 1 tablet (81 mg total) by mouth daily. Swallow whole.   atorvastatin 40 MG tablet Commonly known as: LIPITOR Take 1 tablet (40 mg total) by mouth  daily.   calcium carbonate 1250 (500 Ca) MG tablet Commonly known as: OS-CAL - dosed in mg of elemental calcium Take 1 tablet (1,250 mg total) by mouth 2 (two) times daily with a meal.   eucerin lotion Apply 1 Application topically as needed for dry skin.   ferrous sulfate 325 (65 FE) MG tablet Take 1 tablet (325 mg total) by mouth daily with breakfast.   folic acid 1 MG tablet Commonly known as: FOLVITE TAKE 1 TABLET BY MOUTH EVERY DAY   gabapentin 100 MG capsule Commonly known as: NEURONTIN Take 1 capsule (100 mg total) by mouth at bedtime.   hydrALAZINE 25 MG tablet Commonly known as: APRESOLINE Take 3 tablets (75 mg total) by mouth every 8 (eight) hours. What changed:  how much to take when to take this   magnesium gluconate 500 MG tablet Commonly known as: MAGONATE Take 0.5 tablets (250 mg total) by mouth daily.   metoprolol tartrate 50 MG tablet Commonly known as: LOPRESSOR Take 1 tablet (50 mg total) by mouth 2 (two) times daily.   mouth rinse Liqd solution 15 mLs by Mouth Rinse route 4 (four) times daily - after meals and at bedtime.   nystatin 100000 UNIT/ML suspension Commonly known as: MYCOSTATIN Take 5 mLs (500,000 Units total) by mouth 4 (four) times daily.    senna-docusate 8.6-50 MG tablet Commonly known as: Senokot-S Take 1 tablet by mouth at bedtime as needed for mild constipation. What changed:  when to take this reasons to take this   simethicone 80 MG chewable tablet Commonly known as: MYLICON Chew 1 tablet (80 mg total) by mouth 4 (four) times daily as needed for flatulence.   sodium bicarbonate 650 MG tablet Take 2 tablets (1,300 mg total) by mouth 2 (two) times daily. What changed:  how much to take when to take this   tamsulosin 0.4 MG Caps capsule Commonly known as: FLOMAX TAKE 1 CAPSULE BY MOUTH EVERY DAY   Vitamin D (Ergocalciferol) 1.25 MG (50000 UNIT) Caps capsule Commonly known as: DRISDOL Take 1 capsule (50,000 Units total) by mouth every 7 (seven) days.        Contact information for follow-up providers     Ardith Dark, MD. Call.   Specialty: Family Medicine Why: for follow up after d/c from SNF Contact information: [address] [place] [address] [phone]         Erick Colace, MD Follow up.   Specialty: Physical Medicine and Rehabilitation Why: office will call you with follow up appointment Contact information: [address] [place] [address] [phone]         [facility] Follow up.   Why: office will call you with follow up appointment Contact information: [address]     [address] [place] [address] [phone]        Maxie Barb, MD. Call.   Specialties: Nephrology, Internal Medicine Why: for follow up on renal issues/CKD Contact information: [address] [place] [address] [phone]              Contact information for after-discharge care     Destination     [facility] Preferred SNF .   Service: Skilled Nursing Contact information: [address] [place] [address] [phone]                     Signed: Jacquelynn Cree 11/12/2022, 10:42 AM

## 2022-11-09 NOTE — Plan of Care (Signed)
  Problem: RH Swallowing Goal: LTG Patient will consume least restrictive diet using compensatory strategies with assistance (SLP) Description: LTG:  Patient will consume least restrictive diet using compensatory strategies with assistance (SLP) Outcome: Not Met (add Reason) Goal: LTG Patient will participate in dysphagia therapy to increase swallow function with assistance (SLP) Description: LTG:  Patient will participate in dysphagia therapy to increase swallow function with assistance (SLP) Outcome: Not Met (add Reason) Goal: LTG Pt will demonstrate functional change in swallow as evidenced by bedside/clinical objective assessment (SLP) Description: LTG: Patient will demonstrate functional change in swallow as evidenced by bedside/clinical objective assessment (SLP) Outcome: Not Met (add Reason)   Problem: RH Expression Communication Goal: LTG Patient will verbally express basic/complex needs(SLP) Description: LTG:  Patient will verbally express basic/complex needs, wants or ideas with cues  (SLP) Outcome: Not Met (add Reason)   Problem: RH Problem Solving Goal: LTG Patient will demonstrate problem solving for (SLP) Description: LTG:  Patient will demonstrate problem solving for basic/complex daily situations with cues  (SLP) Outcome: Not Met (add Reason)   Problem: RH Memory Goal: LTG Patient will use memory compensatory aids to (SLP) Description: LTG:  Patient will use memory compensatory aids to recall biographical/new, daily complex information with cues (SLP) Outcome: Not Met (add Reason)   Problem: RH Awareness Goal: LTG: Patient will demonstrate awareness during functional activites type of (SLP) Description: LTG: Patient will demonstrate awareness during functional activites type of (SLP) Outcome: Not Met (add Reason)   Problem: RH Expression Communication Goal: LTG Patient will increase word finding of common (SLP) Description: LTG:  Patient will increase word finding of  common objects/daily info/abstract thoughts with cues using compensatory strategies (SLP). Outcome: Completed/Met   Problem: RH Expression Communication Goal: LTG Patient will increase speech intelligibility (SLP) Description: LTG: Patient will increase speech intelligibility at word/phrase/conversation level with cues, % of the time (SLP) Outcome: Completed/Met

## 2022-11-09 NOTE — Progress Notes (Signed)
Speech Language Pathology Discharge Summary  Patient Details  Name: Daniel [name] MRN: [id number] Date of Birth: 1946-11-24  Date of Discharge from [place] service:November 09, 2022  Patient has met 2 of 9 long term goals.  Patient to discharge at overall Mod;Max level.  Reasons goals not met: severity of deficits, slow progress, discharge earlier than anticipated to SNF   Clinical Impression/Discharge Summary: Patient has made minimal gains and has met 2 of 9 long-term goals this admission. Progress was limited by fatigue impacting functional participation, decreased carry over, severity of deficits, discharge earlier than anticipated (initial plan was to discharge home with sister and support from neighbor), and suspect possible baseline deficits that may impact level of recovery. SLP was unable to discuss pt's PLOF d/t pt being a poor historian and no family or friends present during scheduled speech therapy sessions to confirm. Patient is currently completing functional and basic cognitive tasks with max A cues in regards to intellectual awareness, problem solving, and functional recall. Pt is currently implementing speech intelligibility strategies to communicate functional needs at the word and short phrase level with min-to-mod A verbal cues. Pt continues to be limited by severe dysarthria and decreased recall nor carry over of strategies. Pt is currently consuming a dysphagia 2 diet with thin liquids with minimal overt s/sx of aspiration and requires mod verbal cues to monitor and correct diffuse oral residue. Pt with plans to discharge to SNF and would benefit from ongoing SLP services to address cognitive-linguistic, speech, and swallowing needs to maximize functional independence.   Care Partner:  Caregiver Able to Provide Assistance: No (SNF)    Recommendation:  Home Health SLP;24 hour supervision/assistance  Rationale for SLP Follow Up: Maximize functional communication;Maximize swallowing  safety;Maximize cognitive function and independence;Reduce caregiver burden   Equipment: None   Reasons for discharge: Discharged from hospital   Patient/Family Agrees with Progress Made and Goals Achieved: Yes    Minh Jasper T Zakirah Weingart 11/09/2022, 12:44 PM

## 2022-11-09 NOTE — Progress Notes (Signed)
Physical Therapy Session Note  Patient Details  Name: Daniel [name] MRN: [id number] Date of Birth: 05-26-1946  Today's Date: 11/09/2022 PT Individual Time: 1412-1445 PT Individual Time Calculation (min): 33 min   Short Term Goals: Week 1:  PT Short Term Goal 1 (Week 1): Pt will perform supine<>sit with min assist using bed features as needed PT Short Term Goal 1 - Progress (Week 1): Progressing toward goal PT Short Term Goal 2 (Week 1): Pt will perform sit<>stand transfers using LRAD with min assist PT Short Term Goal 2 - Progress (Week 1): Met PT Short Term Goal 3 (Week 1): Pt will perform bed<>wheelchair transfers using LRAD with min assist consistently PT Short Term Goal 3 - Progress (Week 1): Progressing toward goal PT Short Term Goal 4 (Week 1): Pt will perform at least 9f wheelchair mobility with no more than min assist PT Short Term Goal 4 - Progress (Week 1): Met Week 2:  PT Short Term Goal 1 (Week 2): Pt will perform supine<>sit with min assist using bed features as needed PT Short Term Goal 2 (Week 2): Pt will maintain sitting balance EOB with CGA while setting up his wheelchair for transfers PT Short Term Goal 3 (Week 2): Pt will perform bed<>chair transfers using LRAD with min assist consistently PT Short Term Goal 4 (Week 2): Pt will perform at least 520fof wheelchair mobility with CGA Week 3:     Skilled Therapeutic Interventions/Progress Updates:      Therapy Documentation Precautions:  Precautions Precautions: Fall, Other (comment) Precaution Comments: acute on chronic R hemiparesis with R UE and LE posturing/contractures Restrictions Weight Bearing Restrictions: No General:   Vital Signs:   Pain: Pain Assessment Pain Scale: 0-10 Pain Score: 0-No pain Mobility: Bed Mobility Bed Mobility: Supine to Sit;Sit to Supine Rolling Right: Supervision/verbal cueing Rolling Left: Minimal Assistance - Patient > 75%;Supervision/Verbal cueing Supine to Sit:  Contact Guard/Touching assist Sit to Supine: Minimal Assistance - Patient > 75% Transfers Transfers: Sit to Stand;Stand to Sit;Squat Pivot Transfers Sit to Stand: Contact Guard/Touching assist;Minimal Assistance - Patient > 75% Stand to Sit: Minimal Assistance - Patient > 75%;Contact Guard/Touching assist Squat Pivot Transfers: Contact Guard/Touching assist;Minimal Assistance - Patient > 75% Transfer (Assistive device): Rolling walker Locomotion :    Trunk/Postural Assessment : Cervical Assessment Cervical Assessment: Exceptions to [facility] to L with rounded shoulders) Thoracic Assessment Thoracic Assessment: Exceptions to [facility] Hospital[facility] shoudlers, scoliosis, uneven shoulders) Lumbar Assessment Lumbar Assessment: Exceptions to [facility]pelvic obliquity with windswept legs) Postural Control Postural Control: Deficits on evaluation Trunk Control: fai r Righting Reactions: Impaired Protective Responses: Impaired Postural Limitations: decreased  Balance: Balance Balance Assessed: Yes Static Sitting Balance Static Sitting - Balance Support: Feet supported;Left upper extremity supported Static Sitting - Level of Assistance: 5: Stand by assistance Dynamic Sitting Balance Dynamic Sitting - Balance Support: Feet supported;Left upper extremity supported Dynamic Sitting - Level of Assistance: 5: Stand by assistance Dynamic Sitting Balance - Compensations: Pulling with LUE using bed features Static Standing Balance Static Standing - Balance Support: During functional activity;Bilateral upper extremity supported Static Standing - Level of Assistance: 4: Min assist;5: Stand by assistance Exercises:   Other Treatments:      Therapy/Group: Individual Therapy  JuAlger SimonsT, DPT, CSRS 11/09/2022, 6:11 PM

## 2022-11-09 NOTE — Progress Notes (Addendum)
Speech Language Pathology Daily Session Note  Patient Details  Name: Daniel [name] MRN: [id number] Date of Birth: Jan 09, 1946  Today's Date: 11/09/2022 SLP Individual Time: 0803-0900 SLP Individual Time Calculation (min): 57 min  Short Term Goals: Week 2: SLP Short Term Goal 1 (Week 2): Pt will consume current diet with improved mastication, oral manipulation, minimal oral residuals, and minimal overt s/sx of aspiration with sup A cues for implementation of swallowing precautions/strategies SLP Short Term Goal 2 (Week 2): Patient will implement speech intelligibility strategies at the word and short sentence level with 75% intelligiblity with mod A for implementation of speech intelligibility strategies SLP Short Term Goal 3 (Week 2): Patient will recall and demonstrate use of memory compensations to increase independence and safety at home with mod A verbal cues. SLP Short Term Goal 4 (Week 2): Patient will demonstrate functional problem solving for basic and familiar tasks with Mod A verbal cues.  Skilled Therapeutic Interventions: Skilled ST treatment focused on cognitive-linguistic and speech goals. Pt greeted in bed on arrival and accompanied by nurse.  Pt required max A verbal cues for recall of speech intelligibility strategies. SLP facilitated implementation of these strategies at the word and sentence level with mod A verbal cues to implement over-articulation and pausing between words in order to achieve 75% intelligibility. Pt with limited carry over of strategies during non-structured speech tasks resulting in reduced intelligibility of ~25-50% at the sentence level further c/b fatigue.   Reviewed recommended daily water consumption (6 glasses/day per MD). After reviewing this information 6 times throughout session, pt was still unable to successfully recall these recommendations throughout session ranging from 2/5/8 minute delays. Most successful following 1 minute delay with max A  verbal cues given field of choices.   SLP facilitated functional problem solving scenarios within the home with max A verbal cues for effective problem solving, reasoning, and awareness. Pt continues to exhibit impaired intellectual awareness of deficits, as evidenced by exhibiting difficulty identifying physical/cognitive impairments, as well as stating it would be appropriate for him to drive at discharge. Pt stated "I could drive if I needed to."   Patient was left in bed with alarm activated and immediate needs within reach at end of session. Continue per current plan of care.       Pain Pain Assessment Pain Scale: 0-10 Pain Score: 5  Pain Type: Acute pain Pain Location: Hip Pain Orientation: Left Pain Descriptors / Indicators: Aching Pain Frequency: Constant Pain Onset: Gradual Pain Intervention(s): Medication (See eMAR)  Therapy/Group: Individual Therapy  Ryle Buscemi T Kindred Reidinger 11/09/2022, 8:22 AM

## 2022-11-09 NOTE — Progress Notes (Signed)
Occupational Therapy Session Note  Patient Details  Name: Daniel [name] MRN: [id number] Date of Birth: 1945-12-29  Today's Date: 11/09/2022 OT Individual Time: 4944-9675 OT Individual Time Calculation (min): 46 min    Short Term Goals: Week 2:  OT Short Term Goal 1 (Week 2): Pt will maintain dynamic seated balance during BADLs with SBA OT Short Term Goal 2 (Week 2): Pt will complete LB dressing min A OT Short Term Goal 3 (Week 2): Pt will complete UB dressing with min A OT Short Term Goal 4 (Week 2): Pt will complete toileting mod A  Skilled Therapeutic Interventions/Progress Updates:     Pt received semi-reclined in bed in good spirits and receptive to skilled OT session with a focus on ADL retraining, bed-level toileting/dressing, and NMR-education. Pt reporting 0/10 pain during session.   Pt incontinent bowel movement and soiled in urine in brief upon OT arrival. Pt completed bed mobility rolling R CGA and L min A with mod verbal cueing for body mechanics to assist OT with peri-care. Pt demonstrating learning of rolling technique initially requiring mod cues progressing to simple verbal cues during task. Pt able to assist with anterior peri-care while laying supine in bed max A for posterior peri-care. Pt discussed d/c plans with OT with a focus on problem solving toileting. Pt reported he felt confident using urinal bed level, but will require assistance with posterior per-care following d/c. Pt completed bridge in bed to assist with donning clean brief fastening front of brief using L hand.   Pt supine>sitting EOB close supervision with increased time using bed features. Pt doffed dirty shirt EOB with CGA to maintain seated balance. Pt completed U/LB bathing seated EOB with mod A +time. Mod HOH A provided to use RUE to wash LUE. Pt educated on modified technique for UB bathing demonstrating good teach back during task. Pt donned shirt sitting EOB using modified technique requiring mod  verbal cues for technique and min A to bring shirt over his head. Sitting EOB Pt required stand by A to maintain seated balance and mod verbal cues for head positioning and body alignment.   Pt brushed his teeth sitting EOB with set-up A. Pt utilized modified one-handed technique to don toothpaste. Weight-bearing through RUE facilitated during task to improved Pt awareness of arm and facilitate co-traction for muscle stability.   Pt completed AAROM NMR exercises EOB with min-mod hand under hand A to facilitate full ROM for tricep extension during anterior reaching . Pt demonstrating increased muscle activation of RUE proximal>distal. Pt CGA to return to bed. Pt was left resting in bed with call bell in reach, bed alarm on, and all needs met.   Therapy Documentation Precautions:  Precautions Precautions: Fall, Other (comment) Precaution Comments: acute on chronic R hemiparesis with R UE and LE posturing/contractures Restrictions Weight Bearing Restrictions: No General:     Pain: Pain Assessment Pain Scale: 0-10 Pain Score: 5  Pain Type: Acute pain Pain Location: Hip Pain Orientation: Left Pain Descriptors / Indicators: Aching Pain Frequency: Constant Pain Onset: Gradual Pain Intervention(s): Medication (See eMAR) ADL: ADL Eating: Supervision/safety Where Assessed-Eating: Bed level Grooming: Maximal assistance, Maximal cueing Where Assessed-Grooming: Edge of bed Upper Body Bathing: Maximal assistance Where Assessed-Upper Body Bathing: Edge of bed Lower Body Bathing: Dependent Where Assessed-Lower Body Bathing: Edge of bed Upper Body Dressing: Maximal assistance Where Assessed-Upper Body Dressing: Edge of bed Lower Body Dressing: Dependent Where Assessed-Lower Body Dressing: Edge of bed Toileting: Dependent Where Assessed-Toileting: Other (Comment) (Condom cath  and incontinent of bowel) Toilet Transfer: Unable to assess Toilet Transfer Method: Unable to assess [employer]  Transfer: Unable to assess [employer] Method: Unable to assess   Therapy/Group: Individual Therapy  Janey Genta 11/09/2022, 9:30 AM

## 2022-11-09 NOTE — Progress Notes (Addendum)
PROGRESS NOTE   Subjective/Complaints: +RLE spasticity Patient as no new complaints this morning Tolerated SLP well 5/10 pain in left hip  ROS: +left hip pain  Objective:   No results found. Recent Labs    11/09/22 0704  WBC 4.1  HGB 8.0*  HCT 24.7*  PLT 319    Recent Labs    11/07/22 0913 11/09/22 0704  NA 136 138  K 4.2 4.4  CL 107 107  CO2 18* 19*  GLUCOSE 134* 92  BUN 25* 29*  CREATININE 4.81* 4.91*  CALCIUM 7.9* 8.2*    Intake/Output Summary (Last 24 hours) at 11/09/2022 1048 Last data filed at 11/09/2022 0801 Gross per 24 hour  Intake 476 ml  Output 102 ml  Net 374 ml        Physical Exam: Vital Signs Blood pressure 139/76, pulse 76, temperature 98.8 F (37.1 C), temperature source Oral, resp. rate 18, height 5\' 6"  (1.676 m), weight 66.4 kg, SpO2 99 %.  Gen: no distress, normal appearing HEENT: oral mucosa pink and moist, NCAT Cardio: Reg rate Chest: normal effort, normal rate of breathing Abd: soft, non-distended Ext: no edema Psych: pleasant, normal affect Skin: Skin is warm and dry. +RUE IV  Neuro: + Mild dysarthria, as well as some espressive aphasia- sentence level - stable. AAOx3. Follows all commands.     PE from prior encounter:  RUE- proximal 3/5; distal 2/5 LUE 5-/5 in same muscles RLE- HF 3/5; DF/PF 2/5- pt couldn't understand to test KE/KF LLE- 5-/5 in same muscles- KE/KF NT   Tone increased MAS 3 in R elbow and knee flexors, mainly brachioradialis and medial hamstrings --no change    Assessment/Plan: 1. Functional deficits which require 3+ hours per day of interdisciplinary therapy in a comprehensive inpatient rehab setting. Physiatrist is providing close team supervision and 24 hour management of active medical problems listed below. Physiatrist and rehab team continue to assess barriers to discharge/monitor patient progress toward functional and medical goals  Care  Tool:  Bathing    Body parts bathed by patient: Chest, Abdomen, Front perineal area, Right upper leg, Left upper leg, Right lower leg, Left lower leg, Face, Right arm   Body parts bathed by helper: Left arm     Bathing assist Assist Level: Moderate Assistance - Patient 50 - 74%     Upper Body Dressing/Undressing Upper body dressing   What is the patient wearing?: Pull over shirt, Hospital gown only    Upper body assist Assist Level: Moderate Assistance - Patient 50 - 74%    Lower Body Dressing/Undressing Lower body dressing      What is the patient wearing?: Incontinence brief     Lower body assist Assist for lower body dressing: Maximal Assistance - Patient 25 - 49%     Toileting Toileting    Toileting assist Assist for toileting: Independent with assistive device Assistive Device Comment: urinal   Transfers Chair/bed transfer  Transfers assist  Chair/bed transfer activity did not occur: Safety/medical concerns  Chair/bed transfer assist level: Moderate Assistance - Patient 50 - 74% Chair/bed transfer assistive device: Armrests, Bedrails   Locomotion Ambulation   Ambulation assist   Ambulation activity did  not occur: N/A (pt has not ambulated in 3 years, uses w/c for mobility baseline)          Walk 10 feet activity   Assist           Walk 50 feet activity   Assist           Walk 150 feet activity   Assist           Walk 10 feet on uneven surface  activity   Assist Walk 10 feet on uneven surfaces activity did not occur: N/A         Wheelchair     Assist Is the patient using a wheelchair?: Yes Type of Wheelchair: Manual    Wheelchair assist level: Moderate Assistance - Patient 50 - 74% Max wheelchair distance: 80    Wheelchair 50 feet with 2 turns activity    Assist        Assist Level: Moderate Assistance - Patient 50 - 74%   Wheelchair 150 feet activity     Assist      Assist Level: Maximal  Assistance - Patient 25 - 49%   Blood pressure 139/76, pulse 76, temperature 98.8 F (37.1 C), temperature source Oral, resp. rate 18, height 5\' 6"  (1.676 m), weight 66.4 kg, SpO2 99 %.  Medical Problem List and Plan: 1. Functional deficits secondary to L MCA stroke with R sided weakness in setting of prior R hemiparesis Spasticity R elbow and knee flexors will consider  botox             -patient may  shower             -ELOS/Goals: 12- 14 days supervision   Continue CIR-PT, OT and SLP   2.  Antithrombotics: -DVT/anticoagulation:  Pharmaceutical: Lovenox             -antiplatelet therapy: ASA 3. Pain Management: Tylenol prn.  Bilateral foot pain worse at night , no hx of DM but sx and exam most consistent with neuropathic pain , will trial gabapentin low dose  4. Mood/Behavior/Sleep: LCSW to follow for evaluation and support.              --trazodone prn for insomnia.              -antipsychotic agents: N/A  5. Neuropsych/cognition: This patient is capable of making decisions on his own behalf. 6. Skin/Wound Care: Routine pressure relief measures. And skin /wound care due to incontinence 7. Fluids/Electrolytes/Nutrition: Monitor I/O. Check CMET in am.             --continue IVF and bicarb for now - remains on maintenance 75 cc/hr - DC maintenance, encourage PO 11/23; goal to stabilize to remove PO bicarb and re-initiate ACE - appreciate Nephro note will ask f/u question on those issues   8. HTN: Monitor BP TID--continues to be labile. On hydralazine and amlodipine             --off ace due to acute on chronic renal failure  11/22- BP a little elevated- will monitor for trend and then change meds as required    11/09/2022    5:19 AM 11/09/2022    3:21 AM 11/08/2022    7:24 PM  Vitals with BMI  Weight 146 lbs 6 oz    BMI 18.84    Systolic  166 063  Diastolic  76 69  Pulse  76 74  11/23: Consistently 150-170 SBP; increase Hydralazine to 50 mg BID              11/24:  HTN remains uncontrolled. Cr and BUN has remained stable last several checks; DC IVF, encourage PO, if stable over 1-2 days will re-initiate ACE               11/25: HTN ongoing - hydralazine to 75 mg BID    12/1 fair control--no changes today 9. Acute on chronic renal failure (baseline 2.0-2.3 2021): Continue IVF. Strict I/O.  --SCr down from 6.4-->4.27-> 4.5->4.3   11/22- Cr up somewhat to 4.53 from 4.27- had been better last 3 days, however pt says not drinking well- will push fluids and recheck in AM              11/23: 1,500 IVF yesterday, PO 480 cc PO yesterday, Cr 4.3 today. Continue on PO and repeat in AM.               11/24: PO remains poor, patient remains on 75 cc/hr maintenance IVF, Cr and BUN stable. DC IVF today and recheck next 3 days.               11/25: Encourage PO one additional day; initiate timed toiletting so patient does not avoid fluids d/t incontinence. Labs in AM.     Latest Ref Rng & Units 11/09/2022    7:04 AM 11/07/2022    9:13 AM 11/05/2022    7:15 AM  BMP  Glucose 70 - 99 mg/dL 92  134  97   BUN 8 - 23 mg/dL 29  25  26    Creatinine 0.61 - 1.24 mg/dL 4.91  4.81  4.83   Sodium 135 - 145 mmol/L 138  136  139   Potassium 3.5 - 5.1 mmol/L 4.4  4.2  4.3   Chloride 98 - 111 mmol/L 107  107  107   CO2 22 - 32 mmol/L 19  18  20    Calcium 8.9 - 10.3 mg/dL 8.2  7.9  8.2      9. E coli UTI: completed abx             --monitor voiding with PVR/Bladder scan 10. Anemia fo chronic disease: Drop in Hgb due to hydration             --continue iron supplement.  11. Vitamin D deficiency: Started on ergocalciferol on 11/15 12.  Hypocalcemia: Ca 8.1 w/ alb2.7-->continue supplement as corrects to almost normal.               -- Ca stable 8.1-7.7 11/23  13. Questionable Schizophrenia/Psych Hx: CIR note from 2010 reviewed.  14. Spasticity due to L MCA infarct- focal affecting elbow and knee flexors Xeomin injection today  After verbal consent Xeomin diluted with preservative  free NS Lot #[id number] exp 1/25, [id number] exp 1/25 50u per ml R biceps 50U R brachiorad 50U Right hamstrings 250 U Waste 50U Pt tolerated procedure well No post procedure restrictions Expect onset time ~1wk 12/2-still with significant right sided hypertonia 15. Incontinent of bowel and bladder- has condom cath and using brief-will monitor skin closely.              -have changed senna S to prn which shou8ld help firm up stools and may improve bowel incontinence  12/2 large formed stool yesterday 16. Low Mg  11/22- Up to 2.1- con't to monitor weekly and replete as required- appreciate nephro assist  12/4: messaged nursing to find out how stools have been- last one mushy on 12/2, will hold off on additional stool softners until we hear from nephro on magnesium supplementation since this will help bowels to move  Magnesium 1.6 on 11/27, will inform nephro.       LOS: 13 days A FACE TO FACE EVALUATION WAS PERFORMED  Clide Deutscher Shilee Biggs 11/09/2022, 10:48 AM

## 2022-11-09 NOTE — Progress Notes (Addendum)
Patient ID: Daniel [name], male   DOB: 13-Jan-1946, 76 y.o.   MRN: [id number]  Josem Kaufmann ID: [id number]   8:45 AM: Patient authorization approved through 12/5. SW informed patient sister, [name] of the determination. Patient sister prefers [facility]. SW will follow up with patient on SNF decision. No additional questions or concerns  10:38 AM: SW left VM for covering AD, Kia at [phone]. Sw will wait for follow up.  11:37AM: Sw received follow up from covering AD, Kia. AD would like to plan for a transfer today. Auth provided.  Patient agreeable to transfer.   Kia: [phone]

## 2022-11-09 NOTE — Discharge Instructions (Signed)
Inpatient Rehab Discharge Instructions  Daniel [name] Discharge date and time: 11/09/22   Activities/Precautions/ Functional Status: Activity: activity as tolerated Diet: D2, low salt.  Wound Care: none needed   Functional status:  ___ No restrictions     ___ Walk up steps independently _X__ 24/7 supervision/assistance   ___ Walk up steps with assistance ___ Intermittent supervision/assistance  ___ Bathe/dress independently ___ Walk with walker     ___ Bathe/dress with assistance ___ Walk Independently    ___ Shower independently ___ Walk with assistance    ___ Shower with assistance ___ No alcohol     ___ Return to work/school ________  Special Instructions:  STROKE/TIA DISCHARGE INSTRUCTIONS SMOKING Cigarette smoking nearly doubles your risk of having a stroke & is the single most alterable risk factor  If you smoke or have smoked in the last 12 months, you are advised to quit smoking for your health. Most of the excess cardiovascular risk related to smoking disappears within a year of stopping. Ask you doctor about anti-smoking medications [place] Quit Line: 1-800-QUIT NOW Free Smoking Cessation Classes [phone]  CHOLESTEROL Know your levels; limit fat & cholesterol in your diet  Lipid Panel     Component Value Date/Time   CHOL 132 10/21/2022 0435   CHOL 89 (L) 09/01/2016 1128   TRIG 107 10/21/2022 0435   HDL 46 10/21/2022 0435   HDL 48 09/01/2016 1128   CHOLHDL 2.9 10/21/2022 0435   VLDL 21 10/21/2022 0435   LDLCALC 65 10/21/2022 0435   LDLCALC 32 09/01/2016 1128     Many patients benefit from treatment even if their cholesterol is at goal. Goal: Total Cholesterol (CHOL) less than 160 Goal:  Triglycerides (TRIG) less than 150 Goal:  HDL greater than 40 Goal:  LDL (LDLCALC) less than 100   BLOOD PRESSURE American Stroke Association blood pressure target is less that 120/80 mm/Hg  Your discharge blood pressure is:  BP: 139/76 Monitor your blood pressure Limit  your salt and alcohol intake Many individuals will require more than one medication for high blood pressure  DIABETES (A1c is a blood sugar average for last 3 months) Goal HGBA1c is under 7% (HBGA1c is blood sugar average for last 3 months)  Diabetes: No known diagnosis of diabetes    Lab Results  Component Value Date   HGBA1C 5.5 10/21/2022    Your HGBA1c can be lowered with medications, healthy diet, and exercise. Check your blood sugar as directed by your physician Call your physician if you experience unexplained or low blood sugars.  PHYSICAL ACTIVITY/REHABILITATION Goal is 30 minutes at least 4 days per week  Activity: No driving, Therapies: N/A Return to work: N/A Activity decreases your risk of heart attack and stroke and makes your heart stronger.  It helps control your weight and blood pressure; helps you relax and can improve your mood. Participate in a regular exercise program. Talk with your doctor about the best form of exercise for you (dancing, walking, swimming, cycling).  DIET/WEIGHT Goal is to maintain a healthy weight  Your discharge diet is:  Diet Order             DIET DYS 2 Fluid consistency: Thin  Diet effective now                   liquids Your height is:  Height: 5\' 6"  (167.6 cm) Your current weight is: Weight: 66.4 kg Your Body Mass Index (BMI) is:  BMI (Calculated): 23.64 Following the type  of diet specifically designed for you will help prevent another stroke. Your goal weight range is:   Your goal Body Mass Index (BMI) is 19-24. Healthy food habits can help reduce 3 risk factors for stroke:  High cholesterol, hypertension, and excess weight.  RESOURCES Stroke/Support Group:  Call [phone]   STROKE EDUCATION PROVIDED/REVIEWED AND GIVEN TO PATIENT Stroke warning signs and symptoms How to activate emergency medical system (call 911). Medications prescribed at discharge. Need for follow-up after discharge. Personal risk factors for  stroke. Pneumonia vaccine given:  Flu vaccine given:  My questions have been answered, the writing is legible, and I understand these instructions.  I will adhere to these goals & educational materials that have been provided to me after my discharge from the hospital.     My questions have been answered and I understand these instructions. I will adhere to these goals and the provided educational materials after my discharge from the hospital.  Patient/Caregiver Signature _______________________________ Date __________  Clinician Signature _______________________________________ Date __________  Please bring this form and your medication list with you to all your follow-up doctor's appointments.

## 2022-11-10 NOTE — Progress Notes (Signed)
Occupational Therapy Session Note  Patient Details  Name: Daniel [name] MRN: [id number] Date of Birth: Aug 28, 1946  Today's Date: 11/10/2022 OT Individual Time: 1445-1530 OT Individual Time Calculation (min): 45 min    Short Term Goals: Week 2:  OT Short Term Goal 1 (Week 2): Pt will maintain dynamic seated balance during BADLs with SBA OT Short Term Goal 2 (Week 2): Pt will complete LB dressing min A OT Short Term Goal 3 (Week 2): Pt will complete UB dressing with min A OT Short Term Goal 4 (Week 2): Pt will complete toileting mod A  Skilled Therapeutic Interventions/Progress Updates:     Pt received resting in bed in good spirits and receptive to participating in skilled OT session with a focus on RUE NMR-education, dynamic sitting balance, and BADL retraining. Pt reporting 0/10 pain during session. Pt expressing concern related to upcoming d/c to SNF. Pt provided therapeutic support and encouragement with noted improvement in Pt moral. Pt completed semi supine>EOB with supervision using bed feature with increased time and min verbal cues for body mechanics.   Pt completed RUE PROM stretches to prevent contractures and in preparation for NMR exercises. Pt noted to have increased tone in end and beginning ranges of elbow flexion/extension with improvement following stretches. Pt completed RUE NMR AAROM exercises with increased tricep and bicep activation this session when completing RUE functional reaching with min manual resistance provided and support provided under elbow. Pt able to close hand ~75% in gravity eliminated plane and extend fingers ~25% when giving maximal effort. Sitting EOB pt provided moderate verbal cues for body alignment and positioning of head to increase trunk/head control with mirror placed in front of Pt for visual feedback. Pt min A to guide shoulders and lift R LLE when returning to bed.   Pt re-educated on bed level urinal use to increase independence in toileting.  Pt demonstrating how to complete tasks with min verbal cueing required. Pt was left resting in bed with call bell in reach, bed alarm on, and all needs met.    Therapy Documentation Precautions:  Precautions Precautions: Fall, Other (comment) Precaution Comments: acute on chronic R hemiparesis with R UE and LE posturing/contractures Restrictions Weight Bearing Restrictions: No General:   Vital Signs: Therapy Vitals Temp: 97.8 F (36.6 C) Pulse Rate: 74 Resp: 14 BP: 130/65 Patient Position (if appropriate): Lying Oxygen Therapy SpO2: 100 % O2 Device: Room Air Pain:   ADL: ADL Eating: Supervision/safety Where Assessed-Eating: Bed level Grooming: Setup, Moderate cueing Where Assessed-Grooming: Sitting at sink Upper Body Bathing: Minimal assistance Where Assessed-Upper Body Bathing: Edge of bed Lower Body Bathing: Moderate assistance Where Assessed-Lower Body Bathing: Edge of bed Upper Body Dressing: Minimal assistance Where Assessed-Upper Body Dressing: Edge of bed Lower Body Dressing: Moderate assistance Where Assessed-Lower Body Dressing: Edge of bed Toileting: Setup, Minimal assistance (Pt able to void with set-up A in urinal bed level; requires min-mod A for posterior peri-care and clothing management when using BSC) Where Assessed-Toileting: Bed level, Bedside Commode Toilet Transfer: Minimal assistance Toilet Transfer Method: Squat pivot Toilet Transfer Equipment: Geophysical data processor: Moderate assistance Social research officer, government Method: Education officer, environmental: Shower seat with back   Praxis Praxis: Impaired Praxis Impairment Details: Motor planning    Therapy/Group: Individual Therapy  Janey Genta 11/10/2022, 3:41 PM

## 2022-11-10 NOTE — Progress Notes (Signed)
PROGRESS NOTE   Subjective/Complaints: No pain c/os , eating breakfast , unaware of d/c plans today   ROS: +left hip pain  Objective:   No results found. Recent Labs    11/09/22 0704  WBC 4.1  HGB 8.0*  HCT 24.7*  PLT 319     Recent Labs    11/07/22 0913 11/09/22 0704  NA 136 138  K 4.2 4.4  CL 107 107  CO2 18* 19*  GLUCOSE 134* 92  BUN 25* 29*  CREATININE 4.81* 4.91*  CALCIUM 7.9* 8.2*     Intake/Output Summary (Last 24 hours) at 11/10/2022 0653 Last data filed at 11/09/2022 1838 Gross per 24 hour  Intake 383 ml  Output 2 ml  Net 381 ml         Physical Exam: Vital Signs Blood pressure 138/76, pulse 72, temperature 98.6 F (37 C), temperature source Oral, resp. rate 16, height 5\' 6"  (1.676 m), weight 67 kg, SpO2 97 %.  General: No acute distress Mood and affect are appropriate Heart: Regular rate and rhythm no rubs murmurs or extra sounds Lungs: Clear to auscultation, breathing unlabored, no rales or wheezes Abdomen: Positive bowel sounds, soft nontender to palpation, nondistended Extremities: No clubbing, cyanosis, or edema   Skin: Skin is warm and dry. +RUE IV  Neuro: + Mild dysarthria, as well as some espressive aphasia- sentence level - stable. AAOx3. Follows all commands.     PE from prior encounter:  RUE- proximal 3/5; distal 2/5 LUE 5-/5 in same muscles RLE- HF 3/5; DF/PF 2/5- pt couldn't understand to test KE/KF LLE- 5-/5 in same muscles- KE/KF NT   Tone increased MAS 3 in R elbow and knee flexors, mainly brachioradialis and medial hamstrings --no change    Assessment/Plan: 1. Functional deficits due3 to L MCA infarct  Stable for D/C today to SNF F/u PCP in [facility] F/u PM&R 4-6wks See D/C summary See D/C instructions   Care Tool:  Bathing    Body parts bathed by patient: Chest, Abdomen, Front perineal area, Right upper leg, Left upper leg, Right lower leg, Left lower leg,  Face, Right arm   Body parts bathed by helper: Buttocks, Left arm     Bathing assist Assist Level: Moderate Assistance - Patient 50 - 74%     Upper Body Dressing/Undressing Upper body dressing   What is the patient wearing?: Pull over shirt, Hospital gown only    Upper body assist Assist Level: Minimal Assistance - Patient > 75%    Lower Body Dressing/Undressing Lower body dressing      What is the patient wearing?: Incontinence brief, Pants     Lower body assist Assist for lower body dressing: Moderate Assistance - Patient 50 - 74%     Toileting Toileting    Toileting assist Assist for toileting: Minimal Assistance - Patient > 75% Assistive Device Comment: Pt able to complete toileting using urinal bed level mod I; pt requires assistance for posterior peri-care following BMs   Transfers Chair/bed transfer  Transfers assist  Chair/bed transfer activity did not occur: Safety/medical concerns  Chair/bed transfer assist level: Moderate Assistance - Patient 50 - 74% Chair/bed transfer assistive device: Armrests, Bedrails  Locomotion Ambulation   Ambulation assist   Ambulation activity did not occur: N/A (pt has not ambulated in 3 years, uses w/c for mobility baseline)          Walk 10 feet activity   Assist           Walk 50 feet activity   Assist           Walk 150 feet activity   Assist           Walk 10 feet on uneven surface  activity   Assist Walk 10 feet on uneven surfaces activity did not occur: N/A         Wheelchair     Assist Is the patient using a wheelchair?: Yes Type of Wheelchair: Manual    Wheelchair assist level: Moderate Assistance - Patient 50 - 74% Max wheelchair distance: 80    Wheelchair 50 feet with 2 turns activity    Assist        Assist Level: Moderate Assistance - Patient 50 - 74%   Wheelchair 150 feet activity     Assist      Assist Level: Maximal Assistance - Patient 25 -  49%   Blood pressure 138/76, pulse 72, temperature 98.6 F (37 C), temperature source Oral, resp. rate 16, height 5\' 6"  (1.676 m), weight 67 kg, SpO2 97 %.  Medical Problem List and Plan: 1. Functional deficits secondary to L MCA stroke with R sided weakness in setting of prior R hemiparesis Spasticity R elbow and knee flexors will consider  botox             -patient may  shower             -ELOS/Goals: 12- 14 days supervision   Dc to SNF today   2.  Antithrombotics: -DVT/anticoagulation:  Pharmaceutical: Lovenox             -antiplatelet therapy: ASA 3. Pain Management: Tylenol prn.  Bilateral foot pain worse at night , no hx of DM but sx and exam most consistent with neuropathic pain , will trial gabapentin low dose  4. Mood/Behavior/Sleep: LCSW to follow for evaluation and support.              --trazodone prn for insomnia.              -antipsychotic agents: N/A  5. Neuropsych/cognition: This patient is capable of making decisions on his own behalf. 6. Skin/Wound Care: Routine pressure relief measures. And skin /wound care due to incontinence 7. Fluids/Electrolytes/Nutrition: Monitor I/O. Check CMET in am.             --continue IVF and bicarb for now - remains on maintenance 75 cc/hr - DC maintenance, encourage PO 11/23; goal to stabilize to remove PO bicarb and re-initiate ACE - appreciate Nephro note will ask f/u question on those issues   8. HTN: Monitor BP TID--continues to be labile. On hydralazine and amlodipine             --off ace due to acute on chronic renal failure  11/22- BP a little elevated- will monitor for trend and then change meds as required    11/10/2022    5:00 AM 11/10/2022    4:50 AM 11/09/2022    7:43 PM  Vitals with BMI  Weight 147 lbs 11 oz    BMI 08.65    Systolic  784 696  Diastolic  76 63  Pulse  72 80  11/23: Consistently 150-170 SBP; increase Hydralazine to 50 mg BID              11/24: HTN remains uncontrolled. Cr and BUN has  remained stable last several checks; DC IVF, encourage PO, if stable over 1-2 days will re-initiate ACE               11/25: HTN ongoing - hydralazine to 75 mg BID    12/1 fair control--no changes today 9. Acute on chronic renal failure (baseline 2.0-2.3 2021): Continue IVF. Strict I/O.  --SCr down from 6.4-->4.27-> 4.5->4.3   11/22- Cr up somewhat to 4.53 from 4.27- had been better last 3 days, however pt says not drinking well- will push fluids and recheck in AM              11/23: 1,500 IVF yesterday, PO 480 cc PO yesterday, Cr 4.3 today. Continue on PO and repeat in AM.               11/24: PO remains poor, patient remains on 75 cc/hr maintenance IVF, Cr and BUN stable. DC IVF today and recheck next 3 days.               11/25: Encourage PO one additional day; initiate timed toiletting so patient does not avoid fluids d/t incontinence. Labs in AM.     Latest Ref Rng & Units 11/09/2022    7:04 AM 11/07/2022    9:13 AM 11/05/2022    7:15 AM  BMP  Glucose 70 - 99 mg/dL 92  134  97   BUN 8 - 23 mg/dL 29  25  26    Creatinine 0.61 - 1.24 mg/dL 4.91  4.81  4.83   Sodium 135 - 145 mmol/L 138  136  139   Potassium 3.5 - 5.1 mmol/L 4.4  4.2  4.3   Chloride 98 - 111 mmol/L 107  107  107   CO2 22 - 32 mmol/L 19  18  20    Calcium 8.9 - 10.3 mg/dL 8.2  7.9  8.2      9. E coli UTI: completed abx             --monitor voiding with PVR/Bladder scan 10. Anemia fo chronic disease: Drop in Hgb due to hydration             --continue iron supplement.  11. Vitamin D deficiency: Started on ergocalciferol on 11/15 12.  Hypocalcemia: Ca 8.1 w/ alb2.7-->continue supplement as corrects to almost normal.               -- Ca stable 8.1-7.7 11/23  13. Questionable Schizophrenia/Psych Hx: CIR note from 2010 reviewed.  14. Spasticity due to L MCA infarct- focal affecting elbow and knee flexors Xeomin injection today  After verbal consent Xeomin diluted with preservative free NS Lot #[id number] exp 1/25, 242353  exp 1/25 50u per ml R biceps 50U R brachiorad 50U Right hamstrings 250 U Waste 50U Pt tolerated procedure well No post procedure restrictions Expect onset time ~1wk 12/2-still with significant right sided hypertonia 15. Incontinent of bowel and bladder- has condom cath and using brief-will monitor skin closely.              12/5 large formed stool yesterday 16. Low Mg  Appreciate nephro assist       LOS: 14 days A FACE TO FACE EVALUATION WAS PERFORMED  Charlett Blake 11/10/2022, 6:53 AM

## 2022-11-10 NOTE — Progress Notes (Signed)
Physical Therapy Session Note  Patient Details  Name: Daniel [name] MRN: [id number] Date of Birth: [birth year]  Today's Date: 11/10/2022 PT Individual Time: [phone] and [id number] PT Individual Time Calculation (min): 58 min and 57 min  Short Term Goals: Week 2:  PT Short Term Goal 1 (Week 2): Pt will perform supine<>sit with min assist using bed features as needed PT Short Term Goal 2 (Week 2): Pt will maintain sitting balance EOB with CGA while setting up his wheelchair for transfers PT Short Term Goal 3 (Week 2): Pt will perform bed<>chair transfers using LRAD with min assist consistently PT Short Term Goal 4 (Week 2): Pt will perform at least 24ft of wheelchair mobility with CGA  Skilled Therapeutic Interventions/Progress Updates:    Session 1: Pt received supine in bed with LEs windswept to the R and drawn up in a flexed position (R>L) and pt reporting he is doing "not too good...still feeling bad..my legs hurt all over." Nurse exiting upon therapist arrival and had provided pt medication already. Despite this, pt agreeable to therapy session. Supine>sitting L EOB, HOB partially elevated and using L UE support on bedrail, with CGA/light min assist for R UE management and bringing trunk forward. Sitting EOB, using L UE support on bedrail while participating in UB hygiene and donning clean gown - pt with consistent R trunk posterior lean/LOB sitting EOB relying on L UE on bedrail to assist with maintaining upright (significant pelvic obliquity with L hip elevated and R hip depressed). R stand pivot transfer EOB>w/c using L UE support on bedrail with heavy min assist for lifting/balance with pt unable to step laterally with R LE at this time. Pt noted to have been incontinent of BM. L stand pivot transfer w/c>BSC using L UE support on bedrail with heavy min assist for lifting/balance. Standing with L UE support on bedrail with heavy min assist for balance during dependent LB clothing management  and peri-care - pt continent of bladder. R stand pivot BSC>w/c using L UEssupport on bedrail with heavy min assist for rotating hips and controlled lowering in to seat. Pt performed oral care at sink with set-up assist. Pt left seated in w/c with needs in reach, R UE supported on 1/2 lap tray, and nurse arriving to perform PASRR.     Session 2: Pt received supine in bed with B LEs windswept to the R with R LE drawn up into a flexed and externally rotated position - this prolong positioning in the bed is likely leading to more limited R LE extension ROM and affecting transfers - pt needs support in the bed for improved midline positioning. Pt agreeable to therapy session. Pt noted to have been continent of bladder in urinal.   Supine>sitting L EOB, HOB elevated and using L UE support on bedrail, with CGA/light min assist only for R UE management while pt bringing trunk upright and managing LEs over to EOB. R stand pivot transfer EOB>w/c with heavy min assist for balance as pt pivoting and having limited ability to step R LE laterally and then maintain R LE stance control to step L LE in and back to complete the transfer.  Reviewed w/c part management including brakes and R LE w/c leg rest with patient.  L hemibody (LE primarily due to pt having difficulty coordinating UE and LE push-strokes together with consistent R veer) w/c propulsion ~136ft to main therapy gym with min assist for forward propulsion (pt still doesn't have shoes to allow improved foot traction  on floor requiring increased assistance).  Pt able to lock w/c brakes, requires total assist to remove R UE 1/2 lap tray, and hand-over-hand assist to swing R LE leg rest out of the way - all with 100% verbal cues on what to do.  R stand pivot w/c>EOM as described above with heavy min assist and therapist manually facilitating R LE lateral step length.   Repeated sit<>stands EOM<>RW x3 reps then x5 reps with mirror feedback added on 2nd set  and pt demoing siginficantly improved midline orientation because without the mirror pt with moderately strong R lateral trunk lean with lack of sufficient activation in R hip/knee extensors (although always staying in flexed/crouched posture due to contractures) - with mirror feedback progressed to coming to and stand and sit with CGA/light min assist - pt places both hands up on RW and keeps them there during the transfer - cuing to bring head down to "fold up" when going to sit to decrease poor eccentric control with minor posterior LOB.  Block practice stand pivot transfers w/c<>EOM x2 focusing on improved motor planning and sequencing - pt requires min assist in both directions with details below: - L EOM>w/c: lighter min assist in this direction, pt comes to a stand but then pivots on his feet rather than taking a step and then lowers his pelvis into the chair relying heavily on L UE support on w/c armrest - R w/c>EOM: heavier min assist in this direction with therapist having to manually move/step R LE laterally towards mat to achieve a step and then pt able to slightly step L LE in and back towards the mat (therapist guarding R knee during stance for safety due to slight giving way)  Transported back to his room. Reviewed locking brakes and moving LEs off w/c leg rests.  L stand pivot w/c>EOB using L UE support on bedrail with light min assist for lifting and balance with cuing for sequencing to perform L lateral step to complete transfer.  Sit>supine, HOB partially elevated and using L UE support on bedrail, with CGA and verbal cuing to bring his head laterally down towards the pillow (rather than just leaning backwards across the bed) - pt able to bring LEs up onto bed without assist.   Pt positioned supported upright in bed with pillows positioning LEs to improve midline hip rotation and increased hip/knee extension with HOB raised and in position for his meal. Pt left with needs in reach and  call bell in place with NT arriving to set-up meal.       Therapy Documentation Precautions:  Precautions Precautions: Fall, Other (comment) Precaution Comments: acute on chronic R hemiparesis with R UE and LE posturing/contractures Restrictions Weight Bearing Restrictions: No   Pain:  Session 1: Reports B LE pain - details above - did not limit participation in session and pt premedicated.  Session 2: Denies pain during session.   Therapy/Group: Individual Therapy  Tawana Scale , PT, DPT, NCS, CSRS 11/10/2022, 7:45 AM

## 2022-11-10 NOTE — Discharge Summary (Signed)
Physical Therapy Discharge Summary  Patient Details  Name: JEBIDIAH BAGGERLY MRN: [id number] Date of Birth: Aug 14, 1946  Date of Discharge from PT service:{MONTH:10108} {NUMBERS 1-31 (DATE):31396}, {YEAR HISTORY:31397}  {CHL IP REHAB PT TIME CALCULATION:304800500}   Patient has met {NUMBERS 0-12:18577} of {NUMBERS 0-12:18577} long term goals due to {due HY:8502774}.  Patient to discharge at a wheelchair level Min Assist/mod assist. Patient's care partner is unavailable to provide the necessary physical and cognitive assistance at discharge.  Reasons goals not met: ***  Recommendation:  Patient will benefit from ongoing skilled PT services in skilled nursing facility setting to continue to advance safe functional mobility, address ongoing impairments in ***, and minimize fall risk.  Equipment: No equipment provided, will be provided by facility  Reasons for discharge: {Reason for discharge:3049018}  Patient/family agrees with progress made and goals achieved: Yes  Skilled Therapeutic Interventions/Progress Updates:  ***    PT Discharge Precautions/Restrictions Precautions Precautions: Fall;Other (comment) Precaution Comments: acute on chronic R hemiparesis with R UE and LE posturing/contractures Restrictions Weight Bearing Restrictions: No Pain *** Pain Assessment Pain Scale: 0-10 Pain Score: 10-Worst pain ever Pain Type: Acute pain Pain Location: Leg Pain Orientation: Right;Left Pain Onset: On-going Pain Intervention(s): Medication (See eMAR);Distraction;Repositioned;Ambulation/increased activity;Emotional support Pain Interference Pain Interference Pain Effect on Sleep: 2. Occasionally Pain Interference with Therapy Activities: 1. Rarely or not at all (pt states "I go on and do it") Pain Interference with Day-to-Day Activities: 1. Rarely or not at all Vision/Perception  Vision - History Ability to See in Adequate Light: 0 Adequate Perception Perception:  Impaired Inattention/Neglect: Other (comment) (decreased R side attention (body and environment) but improved) Praxis Praxis: Impaired Praxis Impairment Details: Motor planning  Cognition Overall Cognitive Status: Impaired/Different from baseline Arousal/Alertness: Awake/alert Orientation Level: Oriented to person;Oriented to place;Disoriented to situation;Oriented to time (pt may not have fully understood the question regarding "situation") Year: 2023 Month: December Day of Week: Incorrect Attention: Focused;Sustained Focused Attention: Appears intact Sustained Attention: Impaired Memory: Impaired Awareness: Impaired Problem Solving: Impaired Safety/Judgment: Impaired Sensation Sensation Light Touch: Impaired Detail Central sensation comments: unable to feel touch in R LE and impaired in L LE Light Touch Impaired Details: Absent RLE;Impaired LLE Hot/Cold: Not tested Proprioception: Impaired Detail Proprioception Impaired Details: Impaired RLE;Impaired LLE Stereognosis: Not tested Coordination Gross Motor Movements are Fluid and Coordinated: No Coordination and Movement Description: impaired due to acute on chronic R hemiplegia with flexion contractures Motor  Motor Motor: Hemiplegia;Abnormal tone;Abnormal postural alignment and control Motor - Discharge Observations: Impaired d/t acute on chronic R hemiplegia with flexion contractures  Mobility Bed Mobility Bed Mobility: Supine to Sit;Sit to Supine Supine to Sit: Contact Guard/Touching assist;Minimal Assistance - Patient > 75% (HOB elevated and using LUE support on bedrail) Sit to Supine: Minimal Assistance - Patient > 75% (HOB elevated and using LUE support on bedrail) Transfers Transfers: Sit to Stand;Stand to Sit;Stand Pivot Transfers Sit to Stand: Contact Guard/Touching assist;Minimal Assistance - Patient > 75% Stand to Sit: Contact Guard/Touching assist;Minimal Assistance - Patient > 75% Stand Pivot Transfers:  Minimal Assistance - Patient > 75% (***) Transfer (Assistive device): Other (Comment) (bedrail and w/c armrest) Locomotion  Gait Ambulation: No (pt unable to ambulate at baseline) Gait Gait: No Stairs / Additional Locomotion Stairs: No Wheelchair Mobility Wheelchair Mobility: Yes Wheelchair Propulsion: Left lower extremity Wheelchair Parts Management: Needs assistance  Trunk/Postural Assessment  Cervical Assessment Cervical Assessment: Exceptions to [facility] (L cervical rotation (limited towards R) with forward head and downward gaze primarily) Thoracic Assessment Thoracic Assessment: Exceptions to  WFL (thoracic kyphosis with scoliosis (concave on L side)) Lumbar Assessment Lumbar Assessment: Exceptions to [facility] (pelvic obliquity with L hip higher and significant posterior pelvic tilt with R hip rotated forwards) Postural Control Postural Control: Deficits on evaluation Trunk Control: impaired with R posterior lean/LOB when not using L UE support Righting Reactions: delayed but adequate for mild to moderate LOB in sitting using L UE support for balance recovery  Balance Balance Balance Assessed: Yes Static Sitting Balance Static Sitting - Balance Support: Feet supported;Left upper extremity supported Static Sitting - Level of Assistance: 5: Stand by assistance Dynamic Sitting Balance Dynamic Sitting - Balance Support: Feet supported Dynamic Sitting - Level of Assistance: 4: Min assist;3: Mod assist (if not using L UE support pt with consistent R posterior lean/LOB) Static Standing Balance Static Standing - Balance Support: During functional activity;Left upper extremity supported Static Standing - Level of Assistance: Other (comment) (CGA) Dynamic Standing Balance Dynamic Standing - Balance Support: During functional activity;Left upper extremity supported Dynamic Standing - Level of Assistance: 3: Mod assist;4: Min assist Extremity Assessment            Tawana Scale , PT,  DPT, NCS, CSRS 11/10/2022, 7:52 AM

## 2022-11-10 NOTE — Progress Notes (Signed)
Occupational Therapy Session Note  Patient Details  Name: Daniel [name] MRN: [id number] Date of Birth: 1946/10/05  Today's Date: 11/10/2022 OT Individual Time: 1100-1200 OT Individual Time Calculation (min): 60 min  Short Term Goals: Week 2: OT Short Term Goal 1 (Week 2): Pt will maintain dynamic seated balance during BADLs with SBA OT Short Term Goal 2 (Week 2): Pt will complete LB dressing min A OT Short Term Goal 3 (Week 2): Pt will complete UB dressing with min A OT Short Term Goal 4 (Week 2): Pt will complete toileting mod A  Skilled Therapeutic Interventions/Progress Updates:  Pt received seated in [facility] for skilled OT session with focus on NMR and functional transfers. Pt agreeable to interventions, demonstrating overall pleasant mood. Pt un-rated pain in BLEs. OT offering intermediate rest breaks and positioning suggestions throughout session to address pain/fatigue and maximize participation/safety in session.   Pt dependent for WC transport to ortho gym. In ortho gym, pt performs squat pivot transfer from WC<>EOM with varying level of A, Mod-Max A, and verbal cuing for technique. Seated EOM engages in upright-posture promoting exercise, using mirror for visual feedback and weight-bearing through UE. Pt benefiting from visual target to promote neck extension and verbal countdown to maintain posture, pt completing three sets of 10 sec holds.   Pt then performs ~10-15 reps of scapular elevation/depression (as a preparatory movement), with support at base of scapula and underarm, and heavy multimodal cuing.   Pt participates in series of ROM exercises targeting R-shoulder flexion/extension, abduction/adduction, and internal/external rotation to address RUE tone. RUE supported at proximal/distal joints to ensure safe range to 90 degrees. Pt demonstrating increased tone/stiffness in extremity at this time when compared to previous session.   At table-top, pt performs ~10-15 reps of shoulder  protraction/retraction, requiring heavy tactile cues for compensatory movements, but showing some activation of shoulder musculature during task.  Time spent padding patient's foot pedals to address acute foot pain, decrease chances of pressure wounds, and promote OOB tolerance.  Pt remained seated in [facility] with all immediate needs met at end of session. Pt continues to be appropriate for skilled OT intervention to promote further functional independence.   Therapy Documentation Precautions:  Precautions Precautions: Fall, Other (comment) Precaution Comments: acute on chronic R hemiparesis with R UE and LE posturing/contractures Restrictions Weight Bearing Restrictions: No    Therapy/Group: Individual Therapy  Maudie Mercury, OTR/L, MSOT  11/10/2022, 7:49 AM

## 2022-11-10 NOTE — Progress Notes (Signed)
Patient ID: Daniel [name], male   DOB: [birth year], 76 y.o.   MRN: [id number]  Patient d/c date tentative for today-Thursday. SW will send out update once confirmed.

## 2022-11-11 NOTE — Progress Notes (Signed)
Inpatient Rehabilitation Discharge Medication Review by a Pharmacist  A complete drug regimen review was completed for this patient to identify any potential clinically significant medication issues.  High Risk Drug Classes Is patient taking? Indication by Medication  Antipsychotic No   Anticoagulant No   Antibiotic No   Opioid No   Antiplatelet Yes ASA - CVA ppx  Hypoglycemics/insulin No   Vasoactive Medication Yes Amlodipine, metoprolol tartrate, hydralazine - HTN  Chemotherapy No   Other Yes Atorvastatin - HLD Gabapentin - neuropathy/pain Nystatin - fungal prevention Simethicone - PRN flatulence Senokot-S - PRN constipation Folic acid, magnesium gluconate, calcium carbonate, ferrous sulfate, sodium bicarbonate, vitamin D - supplement Tamsulosin - urinary retention     Type of Medication Issue Identified Description of Issue Recommendation(s)  Drug Interaction(s) (clinically significant)     Duplicate Therapy     Allergy     No Medication Administration End Date     Incorrect Dose     Additional Drug Therapy Needed     Significant med changes from prior encounter (inform family/care partners about these prior to discharge). Lisinopril Dcd (replaced with amlodipine) due to AKI/CKD  Clopidogrel Dcd due to SDH Communicate medication changes with patient/family at discharge  Other       Clinically significant medication issues were identified that warrant physician communication and completion of prescribed/recommended actions by midnight of the next day:  No  Pharmacist comments: n/a  Time spent performing this drug regimen review (minutes): 20   Thank you for allowing pharmacy to be a part of this patient's care.  Ardyth Harps, PharmD Clinical Pharmacist

## 2022-11-11 NOTE — Progress Notes (Signed)
Occupational Therapy Session Note  Patient Details  Name: Daniel [name] MRN: [id number] Date of Birth: [birth year]  Today's Date: 11/11/2022 OT Individual Time: 6803-2122  OT Individual Time Calculation (min): 70 min  OT Minutes Missed: 60 mins   Short Term Goals: Week 2:  OT Short Term Goal 1 (Week 2): Pt will maintain dynamic seated balance during BADLs with SBA OT Short Term Goal 2 (Week 2): Pt will complete LB dressing min A OT Short Term Goal 3 (Week 2): Pt will complete UB dressing with min A OT Short Term Goal 4 (Week 2): Pt will complete toileting mod A  Skilled Therapeutic Interventions/Progress Updates:   Session 1:  Pt received supine in bed with nursing staff present for skilled OT session with focus on ADL retraining. Pt agreeable to interventions, demonstrating overall pleasant mood. Pt with un-rated reports of pain in bilateral feet. OT offering intermediate rest breaks and positioning suggestions throughout session to address pain/fatigue and maximize participation/safety in session.   Pt with episode of bowel incontinence upon arrival of OT, leading to bed-level bathing and dressing activities as focus of session. Pt performs anterior/posterior peri-care with set-up of needed items. Pt then completes sponge bath, cleaning bilateral thighs and L calf indep., requiring verbal cuing to initiate cleaning of R calf and bilateral feet (with A for thoroughness). Pt doffs/dons bilat socks with increased time, requiring A to bring R leg into figure-4 technique and thread toes. Pt needing max A for doffing/donning of brief, assisting therapist with R/L rolls and bridging, R roll observed to be easier than L.    Pt doffs gown with A for fabric management and RUE support. Pt cleans trunk and RUE with verbal cuing for initiation, using RUE to clean L-arm with support at elbow/wrist for NMR. Pt washes face with encouragement, and brushes teeth with Min A for thoroughness. Pt then dons gown  with A for seated balance and verbal cuing for hemi-dressing techniques.    Pt assists with bed boost towards HOB using bed rail and LEs to bridge/push, pt requiring Mod A to complete. Pt's RUE/RLE positioned into flexion with pillows to promote passive stretch.   Pt remained supine in bed with all immediate needs met and bed alarm activated at end of session. Pt continues to be appropriate for skilled OT intervention to promote further functional independence.   Session 2: Pt refusing afternoon session, repeatedly stating "I'm cut out" and declining all activities, even at bed-level. Pt missing 60 minutes of skilled OT intervention, to be made up as appropriate.    Therapy Documentation Precautions:  Precautions Precautions: Fall, Other (comment) Precaution Comments: acute on chronic R hemiparesis with R UE and LE posturing/contractures Restrictions Weight Bearing Restrictions: No   Therapy/Group: Individual Therapy  Maudie Mercury, OTR/L, MSOT  11/11/2022, 7:56 AM

## 2022-11-11 NOTE — Progress Notes (Signed)
Physical Therapy Session Note  Patient Details  Name: Daniel [name] MRN: [id number] Date of Birth: Apr 22, 1946  Today's Date: 11/11/2022 PT Individual Time: 0900-0957 PT Individual Time Calculation (min): 57 min   Short Term Goals: Week 2:  PT Short Term Goal 1 (Week 2): Pt will perform supine<>sit with min assist using bed features as needed PT Short Term Goal 2 (Week 2): Pt will maintain sitting balance EOB with CGA while setting up his wheelchair for transfers PT Short Term Goal 3 (Week 2): Pt will perform bed<>chair transfers using LRAD with min assist consistently PT Short Term Goal 4 (Week 2): Pt will perform at least 39ft of wheelchair mobility with CGA  Skilled Therapeutic Interventions/Progress Updates:      Therapy Documentation Precautions:  Precautions Precautions: Fall, Other (comment) Precaution Comments: acute on chronic R hemiparesis with R UE and LE posturing/contractures Restrictions Weight Bearing Restrictions: No  Pt agreeable to PT session and reports bilateral foot pain, nursing notified and provided with rest and re-positioning for relief. Pt requires min A with rolling to left and mod/max with rolling to the right. Pt requires max A for peri-care as pt incontinent  of bladder on arrival. Pt requires mod A with supine to sit edge of bed and min A for dynamic balance during med pass as pt presents with posterior lean. Pt mod A for squat pivot transfer from bed > w/c > mat table in ortho gym. Pt transported by w/c total A for time management and energy conservation. Pt requires min A for dynamic sitting balance EOM with contralateral and forward reaching with L UE with squigs to verbalized target. Pt requires verbal and visual cues (mirrors) to maintain upright posture.  Pt able to follow commands and with 2 posterior losses of balance requiring min/mod A for recovery. Pt transported to room and left seated in w/c with all needs in reach and chair alarm on.      Therapy/Group: Individual Therapy  Verl Dicker Verl Dicker PT, DPT  11/11/2022, 7:41 AM

## 2022-11-11 NOTE — Progress Notes (Signed)
Patient ID: Daniel [name], male   DOB: Apr 23, 1946, 76 y.o.   MRN: [id number]  Team Conference Report to Patient/Family  Team Conference discussion was reviewed with the patient and caregiver, including goals, any changes in plan of care and target discharge date.  Patient and caregiver express understanding and are in agreement.  The patient has a target discharge date of  (SNF pending).  SW met with patient at bedside and provided conference updates. Patient ready for SNF just waiting on PASRR update. No additional questions or concerns. SW made attempt to call patient sister, [name]. Left detailed VM.   [name] 11/11/2022, 1:26 PM

## 2022-11-11 NOTE — Progress Notes (Signed)
PROGRESS NOTE   Subjective/Complaints:  RIght UE and RLE are still tight, discussed that should start improving in ~3-5 d  ROS: +left hip pain  Objective:   No results found. Recent Labs    11/09/22 0704  WBC 4.1  HGB 8.0*  HCT 24.7*  PLT 319     Recent Labs    11/09/22 0704  NA 138  K 4.4  CL 107  CO2 19*  GLUCOSE 92  BUN 29*  CREATININE 4.91*  CALCIUM 8.2*     Intake/Output Summary (Last 24 hours) at 11/11/2022 0748 Last data filed at 11/11/2022 0732 Gross per 24 hour  Intake 324 ml  Output 900 ml  Net -576 ml         Physical Exam: Vital Signs Blood pressure (!) 147/63, pulse 77, temperature 98.3 F (36.8 C), temperature source Oral, resp. rate 18, height 5\' 6"  (1.676 m), weight 68.6 kg, SpO2 98 %.  General: No acute distress Mood and affect are appropriate Heart: Regular rate and rhythm no rubs murmurs or extra sounds Lungs: Clear to auscultation, breathing unlabored, no rales or wheezes Abdomen: Positive bowel sounds, soft nontender to palpation, nondistended Extremities: No clubbing, cyanosis, or edema   Skin: Skin is warm and dry. +RUE IV  Neuro: + Mild dysarthria, as well as some espressive aphasia- sentence level - stable. AAOx3. Follows all commands.    Motor 2- RIght delt, Bi , tri, 0/5 grip, 3- Right KE , 0 ankle DF   Tone increased MAS 3 in R elbow and knee flexors, mainly brachioradialis and medial hamstrings --no change Triple flexor response RLE    Assessment/Plan: 1. Functional deficits due3 to L MCA infarct  Stable for D/C today to SNF F/u PCP in [place] F/u PM&R 4-6wks See D/C summary See D/C instructions   Care Tool:  Bathing    Body parts bathed by patient: Chest, Abdomen, Front perineal area, Right upper leg, Left upper leg, Right lower leg, Left lower leg, Face, Right arm   Body parts bathed by helper: Buttocks, Left arm     Bathing assist Assist Level:  Moderate Assistance - Patient 50 - 74%     Upper Body Dressing/Undressing Upper body dressing   What is the patient wearing?: Pull over shirt, Hospital gown only    Upper body assist Assist Level: Minimal Assistance - Patient > 75%    Lower Body Dressing/Undressing Lower body dressing      What is the patient wearing?: Incontinence brief, Pants     Lower body assist Assist for lower body dressing: Moderate Assistance - Patient 50 - 74%     Toileting Toileting    Toileting assist Assist for toileting: Minimal Assistance - Patient > 75% Assistive Device Comment: Pt able to complete toileting using urinal bed level mod I; pt requires assistance for posterior peri-care following BMs   Transfers Chair/bed transfer  Transfers assist  Chair/bed transfer activity did not occur: Safety/medical concerns  Chair/bed transfer assist level: Minimal Assistance - Patient > 75% Chair/bed transfer assistive device: Armrests, Bedrails   Locomotion Ambulation   Ambulation assist   Ambulation activity did not occur: N/A (pt has not ambulated in  3 years, uses w/c for mobility baseline)          Walk 10 feet activity   Assist           Walk 50 feet activity   Assist           Walk 150 feet activity   Assist           Walk 10 feet on uneven surface  activity   Assist Walk 10 feet on uneven surfaces activity did not occur: N/A         Wheelchair     Assist Is the patient using a wheelchair?: Yes Type of Wheelchair: Manual    Wheelchair assist level: Minimal Assistance - Patient > 75% Max wheelchair distance: 150ft    Wheelchair 50 feet with 2 turns activity    Assist        Assist Level: Moderate Assistance - Patient 50 - 74%   Wheelchair 150 feet activity     Assist      Assist Level: Maximal Assistance - Patient 25 - 49%   Blood pressure (!) 147/63, pulse 77, temperature 98.3 F (36.8 C), temperature source Oral, resp.  rate 18, height 5\' 6"  (1.676 m), weight 68.6 kg, SpO2 98 %.  Medical Problem List and Plan: 1. Functional deficits secondary to L MCA stroke with R sided weakness in setting of prior R hemiparesis Spasticity R elbow and knee flexors will consider  botox             -patient may  shower       Dc to SNF today   2.  Antithrombotics: -DVT/anticoagulation:  Pharmaceutical: Lovenox             -antiplatelet therapy: ASA 3. Pain Management: Tylenol prn.  Bilateral foot pain worse at night , no hx of DM but sx and exam most consistent with neuropathic pain , will trial gabapentin low dose  4. Mood/Behavior/Sleep: LCSW to follow for evaluation and support.              --trazodone prn for insomnia.              -antipsychotic agents: N/A  5. Neuropsych/cognition: This patient is capable of making decisions on his own behalf. 6. Skin/Wound Care: Routine pressure relief measures. And skin /wound care due to incontinence 7. Fluids/Electrolytes/Nutrition:appreciate nephro assist may d/c IV  8. HTN: Monitor BP TID--continues to be labile. On hydralazine and amlodipine             --off ace due to acute on chronic renal failure  11/22- BP a little elevated- will monitor for trend and then change meds as required    11/11/2022    3:56 AM 11/11/2022    2:49 AM 11/10/2022    9:40 PM  Vitals with BMI  Weight  151 lbs 4 oz   BMI  58.09   Systolic 983  382  Diastolic 63  76  Pulse 77  76               11/23: Consistently 150-170 SBP; increase Hydralazine to 50 mg BID              11/24: HTN remains uncontrolled. Cr and BUN has remained stable last several checks; DC IVF, encourage PO, if stable over 1-2 days will re-initiate ACE               11/25: HTN ongoing - hydralazine to 75 mg BID  12/1 fair control--no changes today 9. Acute on chronic renal failure (baseline 2.0-2.3 2021): Continue IVF. Strict I/O.  --SCr down from 6.4-->4.27-> 4.5->4.3   11/22- Cr up somewhat to 4.53 from 4.27- had been  better last 3 days, however pt says not drinking well- will push fluids and recheck in AM              11/23: 1,500 IVF yesterday, PO 480 cc PO yesterday, Cr 4.3 today. Continue on PO and repeat in AM.               11/24: PO remains poor, patient remains on 75 cc/hr maintenance IVF, Cr and BUN stable. DC IVF today and recheck next 3 days.               11/25: Encourage PO one additional day; initiate timed toiletting so patient does not avoid fluids d/t incontinence. Labs in AM.     Latest Ref Rng & Units 11/09/2022    7:04 AM 11/07/2022    9:13 AM 11/05/2022    7:15 AM  BMP  Glucose 70 - 99 mg/dL 92  134  97   BUN 8 - 23 mg/dL 29  25  26    Creatinine 0.61 - 1.24 mg/dL 4.91  4.81  4.83   Sodium 135 - 145 mmol/L 138  136  139   Potassium 3.5 - 5.1 mmol/L 4.4  4.2  4.3   Chloride 98 - 111 mmol/L 107  107  107   CO2 22 - 32 mmol/L 19  18  20    Calcium 8.9 - 10.3 mg/dL 8.2  7.9  8.2      9. E coli UTI: completed abx             --monitor voiding with PVR/Bladder scan 10. Anemia fo chronic disease: Drop in Hgb due to hydration             --continue iron supplement.  11. Vitamin D deficiency: Started on ergocalciferol on 11/15 12.  Hypocalcemia: Ca 8.1 w/ alb2.7-->continue supplement as corrects to almost normal.               -- Ca stable 8.1-7.7 11/23  13. Questionable Schizophrenia/Psych Hx: CIR note from 2010 reviewed.  14. Spasticity due to L MCA infarct- focal affecting elbow and knee flexors S/p Xeomin 12/2-still with significant right sided hypertonia 15. Incontinent of bowel and bladder- has condom cath and using brief-will monitor skin closely.              12/5 large formed stool yesterday 16. Low Mg  Appreciate nephro assist       LOS: 15 days A FACE TO FACE EVALUATION WAS PERFORMED  Charlett Blake 11/11/2022, 7:48 AM

## 2022-11-11 NOTE — Progress Notes (Signed)
Physical Therapy Session Note  Patient Details  Name: STEFANOS HAYNESWORTH MRN: [id number] Date of Birth: 11-22-1946  Today's Date: 11/11/2022 PT Missed Time: 45 Minutes Missed Time Reason: Patient fatigue  Pt received in a deep sleep lying supine in bed and startles awake to therapist's knocking with pt looking scared. Therapist provided calming voice and ensured pt recognized therapist. Upon encouraging pt to participate in session, pt declines stating he would like to rest at this time and despite attempts at educating and encouraging pt continues to politely decline participation. Pt left supine in bed with needs in reach and bed alarm on. Missed 45 minutes of skilled physical therapy.  Tawana Scale , PT, DPT, NCS, CSRS 11/11/2022, 3:38 PM

## 2022-11-11 NOTE — Patient Care Conference (Signed)
Inpatient RehabilitationTeam Conference and Plan of Care Update Date: 11/11/2022   Time: 10:41 AM    Patient Name: Daniel [name]      Medical Record Number: [id number]  Date of Birth: 11-06-1946 Sex: Male         Room/Bed: 4M04C/4M04C-01 Payor Info: Payor: [employer] MEDICARE / Plan: [employer] MEDICARE CHOICE PPO / Product Type: *No Product type* /    Admit Date/Time:  10/27/2022  5:04 PM  Primary Diagnosis:  Acute ischemic left middle cerebral artery (MCA) stroke [facility])  Hospital Problems: Principal Problem:   Acute ischemic left middle cerebral artery (MCA) stroke (HCC) Active Problems:   Anemia   HTN (hypertension)   Hemiplegia, late effect of cerebrovascular disease ([place])   Peripheral vascular disease ([place])   Acute kidney injury superimposed on chronic kidney disease ([place])   Vitamin D deficiency   Hypomagnesemia    Expected Discharge Date: Expected Discharge Date:  (SNF pending)  Team Members Present: Physician leading conference: Dr. Alysia Penna Social Worker Present: Erlene Quan, BSW Nurse Present: Dorien Chihuahua, RN PT Present: Page Spiro, PT OT Present: Other (comment) Lowella Fairy, OT) SLP Present: Sherren Kerns, SLP PPS Coordinator present : Gunnar Fusi, SLP     Current Status/Progress Goal Weekly Team Focus  Bowel/Bladder   Incontinent of bowel/bladder   Patient to regain continence   regain continence    Swallow/Nutrition/ Hydration   dys 2 diet, thin liquids, min-to-mod A   sup A  tolerance of current diet with implementation of safe swallowing precautions and strategies    ADL's   Bed mobility min A, min/mod stand pivot transfers to [facility], improved bicep and tricep activation in RUE and begining stages of finger flexion/extension; min A UB dressing, mod A LB dressing, mod bathing   Min A for BADLs   BADL retrianing, NMR RUE and LE, functional transfers, UB strengthening    Mobility   CGA/min assist bed mobility relying on bed features,  min assist stand pivot transfers using armrest or bedrail support, min assist L hemi-technique w/c propulsion up to 174ft (using primarily LE due to impaired motor planning/coordination of UE and LE simultaneously) - custom manual wheelchair consult deferred due to pt not planning to D/C to SNF   supervision bed mobility, CGA bed<>chair transfers, supervision wheelchair mobility  activity tolerance, R LE hemibody NMR and ROM, bed mobility training, transfer training, standing tolerance and balance, wheelchair mobility and part management, pt awareness and motor planning    Communication   mod-to-max A speech intelligibility, mod I for word finding   mod I for word finding, min-to-mod A for speech intelligibility   speech intelligibility strategies    Safety/Cognition/ Behavioral Observations  mod-to-max A   sup A   orientation, basic problem solving, memory, intellectual awareness    Pain   Patient has no c/o pain   remain pain free   Pain free    Skin   Pain has no pressure injuries   Remain free of pressure injuries  remain free of pressure injuries      Discharge Planning:  Patient set to d/c to SNF tuesday-Thurs (Pending PASSR)   Team Discussion: Patient post Botox  treatment; positioning in bed addressed to decrease contracture risk. Little progress with SLP; due to poor recall, carry over and intelligibility awareness.  Patient on target to meet rehab goals: no, currently needs min assist to sit EOB with modified strategies. Able to roll right better than to the left. Some shoulder return noted but  requires CGA for bed mobility.  Able to use left arm bedrail or arm rest in chair to complete sit -- stand with min assist. Able to propel left hemi wheelchair with min assist. Continue to note spillage and pocketing when eating; maintain D2 thin liquid diet.   *See Care Plan and progress notes for long and short-term goals.   Revisions to Treatment Plan:  Defer custom  wheelchair eval until after SNF   Teaching Needs: Safety, cues for eating and maintain strategies, medications, transfers, toileting, etc  Current Barriers to Discharge: Decreased caregiver support and Home enviroment access/layout  Possible Resolutions to Barriers: SNF placement pending     Medical Summary Current Status: CKD stable, off IVF, has neuropathy pain, renal dose gabapentin, severe spasticity , poor sitting balance  Barriers to Discharge: Medical stability;Renal Insufficiency/Failure;Spasticity   Possible Resolutions to [employer] Focus: cont PT, OT for ROM, will need additional therapy in SNF setting, improve bed mobility   Continued Need for Acute Rehabilitation Level of Care: The patient requires daily medical management by a physician with specialized training in physical medicine and rehabilitation for the following reasons: Direction of a multidisciplinary physical rehabilitation program to maximize functional independence : Yes Medical management of patient stability for increased activity during participation in an intensive rehabilitation regime.: Yes Analysis of laboratory values and/or radiology reports with any subsequent need for medication adjustment and/or medical intervention. : Yes   I attest that I was present, lead the team conference, and concur with the assessment and plan of the team.   Dorien Chihuahua B 11/11/2022, 2:31 PM

## 2022-11-12 DIAGNOSIS — N179 Acute kidney failure, unspecified: Secondary | ICD-10-CM | POA: Diagnosis not present

## 2022-11-12 DIAGNOSIS — I10 Essential (primary) hypertension: Secondary | ICD-10-CM | POA: Diagnosis not present

## 2022-11-12 DIAGNOSIS — U071 COVID-19: Secondary | ICD-10-CM | POA: Diagnosis not present

## 2022-11-12 DIAGNOSIS — N401 Enlarged prostate with lower urinary tract symptoms: Secondary | ICD-10-CM | POA: Diagnosis not present

## 2022-11-12 DIAGNOSIS — Z79899 Other long term (current) drug therapy: Secondary | ICD-10-CM | POA: Diagnosis not present

## 2022-11-12 DIAGNOSIS — I69320 Aphasia following cerebral infarction: Secondary | ICD-10-CM | POA: Diagnosis not present

## 2022-11-12 DIAGNOSIS — R531 Weakness: Secondary | ICD-10-CM | POA: Diagnosis not present

## 2022-11-12 DIAGNOSIS — N184 Chronic kidney disease, stage 4 (severe): Secondary | ICD-10-CM | POA: Diagnosis not present

## 2022-11-12 DIAGNOSIS — I69391 Dysphagia following cerebral infarction: Secondary | ICD-10-CM | POA: Diagnosis not present

## 2022-11-12 DIAGNOSIS — R29898 Other symptoms and signs involving the musculoskeletal system: Secondary | ICD-10-CM | POA: Diagnosis not present

## 2022-11-12 DIAGNOSIS — I739 Peripheral vascular disease, unspecified: Secondary | ICD-10-CM | POA: Diagnosis not present

## 2022-11-12 DIAGNOSIS — I63512 Cerebral infarction due to unspecified occlusion or stenosis of left middle cerebral artery: Secondary | ICD-10-CM | POA: Diagnosis not present

## 2022-11-12 DIAGNOSIS — M6281 Muscle weakness (generalized): Secondary | ICD-10-CM | POA: Diagnosis not present

## 2022-11-12 DIAGNOSIS — Z8616 Personal history of COVID-19: Secondary | ICD-10-CM | POA: Diagnosis not present

## 2022-11-12 DIAGNOSIS — I69322 Dysarthria following cerebral infarction: Secondary | ICD-10-CM | POA: Diagnosis not present

## 2022-11-12 DIAGNOSIS — Z20822 Contact with and (suspected) exposure to covid-19: Secondary | ICD-10-CM | POA: Diagnosis not present

## 2022-11-12 DIAGNOSIS — E860 Dehydration: Secondary | ICD-10-CM | POA: Diagnosis not present

## 2022-11-12 DIAGNOSIS — E559 Vitamin D deficiency, unspecified: Secondary | ICD-10-CM | POA: Diagnosis not present

## 2022-11-12 DIAGNOSIS — I69353 Hemiplegia and hemiparesis following cerebral infarction affecting right non-dominant side: Secondary | ICD-10-CM | POA: Diagnosis not present

## 2022-11-12 DIAGNOSIS — S065XAS Traumatic subdural hemorrhage with loss of consciousness status unknown, sequela: Secondary | ICD-10-CM | POA: Diagnosis not present

## 2022-11-12 DIAGNOSIS — I69328 Other speech and language deficits following cerebral infarction: Secondary | ICD-10-CM | POA: Diagnosis not present

## 2022-11-12 DIAGNOSIS — B370 Candidal stomatitis: Secondary | ICD-10-CM | POA: Diagnosis not present

## 2022-11-12 DIAGNOSIS — Z7401 Bed confinement status: Secondary | ICD-10-CM | POA: Diagnosis not present

## 2022-11-12 NOTE — Progress Notes (Signed)
Occupational Therapy Discharge Summary  Patient Details  Name: Daniel [name] MRN: [id number] Date of Birth: [birth year]  Date of Discharge from OT service:November 12, 2022  Patient has met 6 of 9 long term goals due to improved activity tolerance, improved balance, postural control, ability to compensate for deficits, functional use of  RIGHT upper extremity, improved attention, and improved awareness.  Patient to discharge at overall Min-Mod Assist level.  Patient's care partner requires assistance to provide the necessary physical assistance at discharge.    Reasons goals not met: Pt currently requiring mod A to complete toilet transfers, LB dressing, and tub bench transfers d/t decreased problem solving skills, decrease awareness, decreased postural control, and decreased functional use of RU/LE.   Recommendation:  Patient will benefit from ongoing skilled OT services in skilled nursing facility setting to continue to advance functional skills in the area of BADL, iADL, and Reduce care partner burden.  Equipment: No equipment provided  Reasons for discharge: treatment goals met and discharge from hospital  Patient/family agrees with progress made and goals achieved: Yes  OT Discharge Precautions/Restrictions  Precautions Precautions: Fall;Other (comment) Precaution Comments: acute on chronic R hemiparesis with R UE and LE posturing/contractures Restrictions Weight Bearing Restrictions: No  Pain Pain Assessment Pain Scale: 0-10 Pain Score: 0-No pain ADL ADL Eating: Supervision/safety Where Assessed-Eating: Bed level Grooming: Setup, Moderate cueing Where Assessed-Grooming: Sitting at sink Upper Body Bathing: Minimal assistance Where Assessed-Upper Body Bathing: Bed level Lower Body Bathing: Minimal assistance Where Assessed-Lower Body Bathing: Bed level Upper Body Dressing: Minimal assistance Where Assessed-Upper Body Dressing: Edge of bed Lower Body Dressing: Moderate  assistance Where Assessed-Lower Body Dressing: Bed level Toileting: Setup, Minimal assistance Where Assessed-Toileting: Bed level Toilet Transfer: Not assessed, Other (comment) (Pt completing toileting bed level) Toilet Transfer Method: Squat pivot Toilet Transfer Equipment: Geophysical data processor: Moderate assistance Social research officer, government Method: Education officer, environmental: Shower seat with back ADL Comments: Pt planning to complete bathing bed level upon d/c Vision Baseline Vision/History: 0 No visual deficits Patient Visual Report: No change from baseline Vision Assessment?: Yes Eye Alignment: Within Functional Limits Ocular Range of Motion: Restricted on the right Alignment/Gaze Preference: Chin down;Gaze left Tracking/Visual Pursuits: Requires cues, head turns, or add eye shifts to track;Decreased smoothness of horizontal tracking Saccades: Decreased speed of saccadic movement;Additional head turns occurred during testing Convergence: Within functional limits Visual Fields: Right visual field deficit Perception  Perception: Impaired Inattention/Neglect: Other (comment) (Decreased attention to R side, improvement from eval) Praxis Praxis: Impaired Praxis Impairment Details: Motor planning Cognition Cognition Overall Cognitive Status: Impaired/Different from baseline Arousal/Alertness: Awake/alert Orientation Level: Person;Situation Memory: Impaired Memory Impairment: Storage deficit;Decreased recall of new information;Retrieval deficit Attention: Focused;Sustained Focused Attention: Appears intact Sustained Attention: Impaired Sustained Attention Impairment: Verbal basic Selective Attention: Impaired Selective Attention Impairment: Verbal basic;Functional basic Awareness: Impaired Awareness Impairment: Intellectual impairment Problem Solving: Impaired Problem Solving Impairment: Verbal basic;Functional basic Executive Function:  (All  impaired d/t Pt at lower cognitve level) Safety/Judgment: Impaired Brief Interview for Mental Status (BIMS) Repetition of Three Words (First Attempt): 3 Temporal Orientation: Year: Correct Temporal Orientation: Month: Accurate within 5 days Temporal Orientation: Day: Correct Recall: "Sock": No, could not recall Recall: "Blue": Yes, no cue required Recall: "Bed": No, could not recall BIMS Summary Score: 11 Sensation Sensation Light Touch: Impaired Detail Central sensation comments: unable to feel touch in R LE and impaired in L LE Light Touch Impaired Details: Absent RLE;Impaired LLE Hot/Cold: Not tested Proprioception: Impaired Detail Proprioception Impaired  Details: Impaired RLE;Impaired LLE Stereognosis: Not tested Coordination Gross Motor Movements are Fluid and Coordinated: No Fine Motor Movements are Fluid and Coordinated: No Coordination and Movement Description: impaired due to acute on chronic R hemiplegia with flexion contractures Motor  Motor Motor: Hemiplegia;Abnormal tone;Abnormal postural alignment and control Motor - Discharge Observations: Impaired d/t acute on chronic R hemiplegia with flexion contractures RLE>RUE Mobility  Bed Mobility Bed Mobility: Supine to Sit;Sit to Supine Rolling Right: Supervision/verbal cueing Rolling Left: Minimal Assistance - Patient > 75%;Supervision/Verbal cueing Supine to Sit: Contact Guard/Touching assist;Minimal Assistance - Patient > 75% Sit to Supine: Minimal Assistance - Patient > 75% Transfers Sit to Stand: Contact Guard/Touching assist;Minimal Assistance - Patient > 75% Stand to Sit: Contact Guard/Touching assist;Minimal Assistance - Patient > 75%  Trunk/Postural Assessment  Cervical Assessment Cervical Assessment: Exceptions to WFL Thoracic Assessment Thoracic Assessment: Exceptions to WFL Lumbar Assessment Lumbar Assessment: Exceptions to WFL Postural Control Postural Control: Deficits on evaluation Righting  Reactions: delayed but adequate for mild to moderate LOB in sitting using L UE support for balance recovery Protective Responses: Impaired Postural Limitations: decreased  Balance Balance Balance Assessed: Yes Static Sitting Balance Static Sitting - Balance Support: Feet supported;Left upper extremity supported Static Sitting - Level of Assistance: 5: Stand by assistance Dynamic Sitting Balance Dynamic Sitting - Balance Support: Feet supported Dynamic Sitting - Level of Assistance: 4: Min assist;3: Mod assist Static Standing Balance Static Standing - Balance Support: During functional activity;Left upper extremity supported Static Standing - Level of Assistance: 4: Min assist Dynamic Standing Balance Dynamic Standing - Balance Support: During functional activity;Left upper extremity supported Dynamic Standing - Level of Assistance: 3: Mod assist;4: Min assist Extremity/Trunk Assessment RUE Assessment RUE Assessment: Exceptions to WFL Active Range of Motion (AROM) Comments: distal 2-/5, triceps 2+/5, biceps 3/5, shoudler protraction/retraction 2+/5 RUE Body System: Neuro Brunstrum levels for arm and hand: Arm;Hand Brunstrum level for arm: Stage III Synergy is performed voluntarily Brunstrum level for hand: Stage II Synergy is developing RUE AROM (degrees) Overall AROM Right Upper Extremity: Deficits RUE Overall AROM Comments: Pt demonstrating returning of muscle activation proximal>distal LUE Assessment LUE Assessment: Within Functional Limits Active Range of Motion (AROM) Comments: 5-/5    A  11/12/2022, 4:10 PM 

## 2022-11-12 NOTE — Progress Notes (Signed)
PROGRESS NOTE   Subjective/Complaints:  No foot pain, discussed that at home he was mainly in [facility] during the day   ROS: limited by cognition   Objective:   No results found. No results for input(s): "WBC", "HGB", "HCT", "PLT" in the last 72 hours.   No results for input(s): "NA", "K", "CL", "CO2", "GLUCOSE", "BUN", "CREATININE", "CALCIUM" in the last 72 hours.   Intake/Output Summary (Last 24 hours) at 11/12/2022 0723 Last data filed at 11/11/2022 1805 Gross per 24 hour  Intake 472 ml  Output 300 ml  Net 172 ml         Physical Exam: Vital Signs Blood pressure (!) 145/77, pulse 73, temperature 99 F (37.2 C), temperature source Oral, resp. rate 17, height 5\' 6"  (1.676 m), weight 64.1 kg, SpO2 100 %.  General: No acute distress Mood and affect are appropriate Heart: Regular rate and rhythm no rubs murmurs or extra sounds Lungs: Clear to auscultation, breathing unlabored, no rales or wheezes Abdomen: Positive bowel sounds, soft nontender to palpation, nondistended Extremities: No clubbing, cyanosis, or edema   Skin: Skin is warm and dry. +RUE IV  Neuro: + Mild dysarthria, as well as some espressive aphasia- sentence level - stable. AAOx2. Follows all commands.    Motor 2- RIght delt, Bi , tri, 0/5 grip, 3- Right KE , 0 ankle DF   Tone increased MAS 3 in R elbow and knee flexors, mainly brachioradialis and medial hamstrings --no change Triple flexor response RLE    Assessment/Plan: 1. Functional deficits due3 to L MCA infarct  Stable for D/C today to SNF F/u PCP in [place] F/u PM&R 4-6wks See D/C summary See D/C instructions   Care Tool:  Bathing    Body parts bathed by patient: Right arm, Chest, Abdomen, Front perineal area, Buttocks, Right upper leg, Left upper leg, Right lower leg, Left lower leg, Face   Body parts bathed by helper: Left arm     Bathing assist Assist Level: Minimal Assistance -  Patient > 75% (Bed-level)     Upper Body Dressing/Undressing Upper body dressing   What is the patient wearing?: Hospital gown only    Upper body assist Assist Level: Minimal Assistance - Patient > 75%    Lower Body Dressing/Undressing Lower body dressing      What is the patient wearing?: Incontinence brief, Pants     Lower body assist Assist for lower body dressing: Moderate Assistance - Patient 50 - 74%     Toileting Toileting    Toileting assist Assist for toileting: Minimal Assistance - Patient > 75% Assistive Device Comment: Pt able to complete toileting using urinal bed level mod I; pt requires assistance for posterior peri-care following BMs   Transfers Chair/bed transfer  Transfers assist  Chair/bed transfer activity did not occur: Safety/medical concerns  Chair/bed transfer assist level: Minimal Assistance - Patient > 75% Chair/bed transfer assistive device: Armrests, Bedrails   Locomotion Ambulation   Ambulation assist   Ambulation activity did not occur: N/A (pt has not ambulated in 3 years, uses w/c for mobility baseline)          Walk 10 feet activity   Assist  Walk 50 feet activity   Assist           Walk 150 feet activity   Assist           Walk 10 feet on uneven surface  activity   Assist Walk 10 feet on uneven surfaces activity did not occur: N/A         Wheelchair     Assist Is the patient using a wheelchair?: Yes Type of Wheelchair: Manual    Wheelchair assist level: Minimal Assistance - Patient > 75% Max wheelchair distance: 131ft    Wheelchair 50 feet with 2 turns activity    Assist        Assist Level: Moderate Assistance - Patient 50 - 74%   Wheelchair 150 feet activity     Assist      Assist Level: Maximal Assistance - Patient 25 - 49%   Blood pressure (!) 145/77, pulse 73, temperature 99 F (37.2 C), temperature source Oral, resp. rate 17, height 5\' 6"  (1.676 m),  weight 64.1 kg, SpO2 100 %.  Medical Problem List and Plan: 1. Functional deficits secondary to L MCA stroke with R sided weakness in setting of prior R hemiparesis Spasticity R elbow and knee flexors will consider  botox             -patient may  shower       Dc to SNF once accepted   2.  Antithrombotics: -DVT/anticoagulation:  Pharmaceutical: Lovenox             -antiplatelet therapy: ASA 3. Pain Management: Tylenol prn.  Bilateral foot pain worse at night , no hx of DM but sx and exam most consistent with neuropathic pain , will trial gabapentin low dose  4. Mood/Behavior/Sleep: LCSW to follow for evaluation and support.              --trazodone prn for insomnia.              -antipsychotic agents: N/A  5. Neuropsych/cognition: This patient is capable of making decisions on his own behalf. 6. Skin/Wound Care: Routine pressure relief measures. And skin /wound care due to incontinence 7. Fluids/Electrolytes/Nutrition:appreciate nephro assist may d/c IV  8. HTN: Monitor BP TID--continues to be labile. On hydralazine and amlodipine             --off ace due to acute on chronic renal failure  11/22- BP a little elevated- will monitor for trend and then change meds as required    11/12/2022    5:20 AM 11/11/2022    8:15 PM 11/11/2022    2:43 PM  Vitals with BMI  Weight 141 lbs 5 oz    BMI 05.39    Systolic [phone]  Diastolic 77 65 72  Pulse 73 79 79               11/23: Consistently 150-170 SBP; increase Hydralazine to 50 mg BID              11/24: HTN remains uncontrolled. Cr and BUN has remained stable last several checks; DC IVF, encourage PO, if stable over 1-2 days will re-initiate ACE               11/25: HTN ongoing - hydralazine to 75 mg BID    12/1 fair control--no changes today 9. Acute on chronic renal failure (baseline 2.0-2.3 2021): Continue IVF. Strict I/O.  --SCr down from 6.4-->4.27-> 4.5->4.3   11/22- Cr up somewhat to  4.53 from 4.27- had been better last 3  days, however pt says not drinking well- will push fluids and recheck in AM              11/23: 1,500 IVF yesterday, PO 480 cc PO yesterday, Cr 4.3 today. Continue on PO and repeat in AM.               11/24: PO remains poor, patient remains on 75 cc/hr maintenance IVF, Cr and BUN stable. DC IVF today and recheck next 3 days.               11/25: Encourage PO one additional day; initiate timed toiletting so patient does not avoid fluids d/t incontinence. Labs in AM.     Latest Ref Rng & Units 11/09/2022    7:04 AM 11/07/2022    9:13 AM 11/05/2022    7:15 AM  BMP  Glucose 70 - 99 mg/dL 92  134  97   BUN 8 - 23 mg/dL 29  25  26    Creatinine 0.61 - 1.24 mg/dL 4.91  4.81  4.83   Sodium 135 - 145 mmol/L 138  136  139   Potassium 3.5 - 5.1 mmol/L 4.4  4.2  4.3   Chloride 98 - 111 mmol/L 107  107  107   CO2 22 - 32 mmol/L 19  18  20    Calcium 8.9 - 10.3 mg/dL 8.2  7.9  8.2      9. E coli UTI: completed abx             --monitor voiding with PVR/Bladder scan 10. Anemia fo chronic disease: Drop in Hgb due to hydration             --continue iron supplement.  11. Vitamin D deficiency: Started on ergocalciferol on 11/15 12.  Hypocalcemia: Ca 8.1 w/ alb2.7-->continue supplement as corrects to almost normal.               -- Ca stable 8.1-7.7 11/23  13. Questionable Schizophrenia/Psych Hx: CIR note from 2010 reviewed.  14. Spasticity due to L MCA infarct- focal affecting elbow and knee flexors S/p Xeomin 12/7-still with significant right sided hypertonia but may have element of contrature at the R knee flexors  15. Incontinent of bowel and bladder- has condom cath and using brief-will monitor skin closely.              12/5 large formed stool yesterday 16. Low Mg  Appreciate nephro assist       LOS: 16 days A FACE TO FACE EVALUATION WAS PERFORMED  Charlett Blake 11/12/2022, 7:23 AM

## 2022-11-12 NOTE — Progress Notes (Signed)
Physical Therapy Session Note  Patient Details  Name: Daniel [name] MRN: [id number] Date of Birth: 01/03/1946  Today's Date: 11/12/2022 PT Individual Time: 0806-0850 PT Individual Time Calculation (min): 44 min   Short Term Goals: Week 2:  PT Short Term Goal 1 (Week 2): Pt will perform supine<>sit with min assist using bed features as needed PT Short Term Goal 2 (Week 2): Pt will maintain sitting balance EOB with CGA while setting up his wheelchair for transfers PT Short Term Goal 3 (Week 2): Pt will perform bed<>chair transfers using LRAD with min assist consistently PT Short Term Goal 4 (Week 2): Pt will perform at least 65ft of wheelchair mobility with CGA  Skilled Therapeutic Interventions/Progress Updates:    Pt received supine in bed saying "what do you want?" upon thearpist arrival. Therapist provided calming reassurance of her presence and educated pt on plan to perform OOB mobility this morning. Pt calmed and remained very pleasant and compliant for remainder of session. Pt reports he is in a wet brief.   Supine>sitting L EOB, HOB partially elevated and relying on L UE support on bedrail, with min assist for R LE and UE management to ensure cleanliness while transitioning out of soiled bed.  Sit<>stands to/from EOB with L UE support on bedrail with min assist for lifting/balance due to R posterior lean. Standing with consistent min progressed to lighter min assist during dependent peri-care and LB clothing management. Sitting EOB donned clean gown total assist for time management.  Pt adamantly declining sitting in w/c stating he didn't want to leave the room today but is agreeable to transferring to recliner. R stand pivot EOB>recliner using L UE support on bedrail with min assist for balance while rotating, pt continues to lack full R lateral step length during transfer.  Sitting in recliner at sink performed oral care with set-up assistance. At end of session, pt left seated in  recliner with needs in reach, pillows positioned for improved midline orientation, urinal positioned for pt to independently void, and seat belt alarm on.   Therapy Documentation Precautions:  Precautions Precautions: Fall, Other (comment) Precaution Comments: acute on chronic R hemiparesis with R UE and LE posturing/contractures Restrictions Weight Bearing Restrictions: No   Pain: No reports of pain throughout session.    Therapy/Group: Individual Therapy  Tawana Scale , PT, DPT, NCS, CSRS 11/12/2022, 7:46 AM

## 2022-11-12 NOTE — Progress Notes (Signed)
Inpatient Rehabilitation Care Coordinator Discharge Note   Patient Details  Name: Daniel [name] MRN: [id number] Date of Birth: 1946-06-15   Discharge location: SNF ([facility])  Length of Stay: 16Days  Discharge activity level: Mod/max  Home/community participation: Sister or niece  Patient response FB:XUXYBF Literacy - How often do you need to have someone help you when you read instructions, pamphlets, or other written material from your doctor or pharmacy?: Sometimes  Patient response XO:VANVBT Isolation - How often do you feel lonely or isolated from those around you?: Never  Services provided included: MD, RD, PT, OT, SLP, RN, TR, CM, Pharmacy, Neuropsych, SW  Financial Services:  Charity fundraiser Utilized: [employer]  Choices offered to/list presented to: pt and sister  Follow-up services arranged:              Patient response to transportation need: Is the patient able to respond to transportation needs?: Yes In the past 12 months, has lack of transportation kept you from medical appointments or from getting medications?: No In the past 12 months, has lack of transportation kept you from meetings, work, or from getting things needed for daily living?: No    Comments (or additional information):  Patient/Family verbalized understanding of follow-up arrangements:  Yes  Individual responsible for coordination of the follow-up plan: [name]  Confirmed correct DME delivered: [name] 11/12/2022    [name]

## 2022-11-12 NOTE — Progress Notes (Signed)
Physical Therapy Session Note  Patient Details  Name: Daniel [name] MRN: [id number] Date of Birth: [birth year]  Today's Date: 11/12/2022 PT Individual Time: 0920-0945 PT Individual Time Calculation (min): 25 min   Short Term Goals: Week 1:  PT Short Term Goal 1 (Week 1): Pt will perform supine<>sit with min assist using bed features as needed PT Short Term Goal 1 - Progress (Week 1): Progressing toward goal PT Short Term Goal 2 (Week 1): Pt will perform sit<>stand transfers using LRAD with min assist PT Short Term Goal 2 - Progress (Week 1): Met PT Short Term Goal 3 (Week 1): Pt will perform bed<>wheelchair transfers using LRAD with min assist consistently PT Short Term Goal 3 - Progress (Week 1): Progressing toward goal PT Short Term Goal 4 (Week 1): Pt will perform at least 25f wheelchair mobility with no more than min assist PT Short Term Goal 4 - Progress (Week 1): Met Week 2:  PT Short Term Goal 1 (Week 2): Pt will perform supine<>sit with min assist using bed features as needed PT Short Term Goal 2 (Week 2): Pt will maintain sitting balance EOB with CGA while setting up his wheelchair for transfers PT Short Term Goal 3 (Week 2): Pt will perform bed<>chair transfers using LRAD with min assist consistently PT Short Term Goal 4 (Week 2): Pt will perform at least 541fof wheelchair mobility with CGA Week 3:     Skilled Therapeutic Interventions/Progress Updates:   Pt received sitting in recliner and agreeable to PT. Pt noted to be attempting urination. Continued to try without assist in urinal, then requesting attempt on toilet. Stand [facility] mod assist and cues for use of RLE in transfer. Sitting balance on BSC with supervision assist and no trunk support. Pt able to void Bowels. Sit>partial stand with mod assist for PT to perform hygiene. Squat pivot transfer back to recliner with max assist to prevent LOB to the R. Pt left sitting in recliner with call bell in reach  and all needs met.       Therapy Documentation Precautions:  Precautions Precautions: Fall, Other (comment) Precaution Comments: acute on chronic R hemiparesis with R UE and LE posturing/contractures Restrictions Weight Bearing Restrictions: No   Pain: denies    Therapy/Group: Individual Therapy  AuLorie Phenix2/06/2022, 9:47 AM

## 2022-11-12 NOTE — Progress Notes (Addendum)
Patient ID: Daniel [name], male   DOB: 1946/09/16, 76 y.o.   MRN: [id number]  Patient ready for d/c to SNF. Number for report: [id number], # for report [id number]. SW made 3 attempts to call patient sister, Daniel [name]. Patient informed. Sw will continue to follow up with pt sister.

## 2022-11-12 NOTE — Plan of Care (Signed)
  Problem: RH Dressing Goal: LTG Patient will perform lower body dressing w/assist (OT) Description: LTG: Patient will perform lower body dressing with assist, with/without cues in positioning using equipment (OT) Outcome: Not Met (add Reason) Flowsheets (Taken 10/28/2022 1656) LTG: Pt will perform lower body dressing with assistance level of: Minimal Assistance - Patient > 75% Note: Pt continues to require mod A for LB d/t decreased problem solving, awareness, postural control, and decreased functional use of RU/LES   Problem: RH Toilet Transfers Goal: LTG Patient will perform toilet transfers w/assist (OT) Description: LTG: Patient will perform toilet transfers with assist, with/without cues using equipment (OT) Outcome: Not Met (add Reason) Note: Pt requires mod A to complete squat pivot transfers to [facility] d/t decreased functional use of RU/LE   Problem: RH Tub/Shower Transfers Goal: LTG Patient will perform tub/shower transfers w/assist (OT) Description: LTG: Patient will perform tub/shower transfers with assist, with/without cues using equipment (OT) Outcome: Not Met (add Reason) Flowsheets (Taken 10/28/2022 1656) LTG: Pt will perform tub/shower stall transfers with assistance level of: Contact Guard/Touching assist Note: Pt requires mod A for tub bench transfers. Pt planning to complete sponge baths bed level upon d/c.   Problem: RH Balance Goal: LTG: Patient will maintain dynamic sitting balance (OT) Description: LTG:  Patient will maintain dynamic sitting balance with assistance during activities of daily living (OT) Outcome: Completed/Met Flowsheets (Taken 10/28/2022 1655) LTG: Pt will maintain dynamic sitting balance during ADLs with: Contact Guard/Touching assist   Problem: RH Grooming Goal: LTG Patient will perform grooming w/assist,cues/equip (OT) Description: LTG: Patient will perform grooming with assist, with/without cues using equipment (OT) Outcome:  Completed/Met Flowsheets (Taken 10/28/2022 1656) LTG: Pt will perform grooming with assistance level of: Minimal Assistance - Patient > 75%   Problem: RH Bathing Goal: LTG Patient will bathe all body parts with assist levels (OT) Description: LTG: Patient will bathe all body parts with assist levels (OT) Outcome: Completed/Met Flowsheets (Taken 10/28/2022 1656) LTG: Pt will perform bathing with assistance level/cueing: Minimal Assistance - Patient > 75%   Problem: RH Dressing Goal: LTG Patient will perform upper body dressing (OT) Description: LTG Patient will perform upper body dressing with assist, with/without cues (OT). Outcome: Completed/Met Flowsheets (Taken 10/28/2022 1656) LTG: Pt will perform upper body dressing with assistance level of: Minimal Assistance - Patient > 75%   Problem: RH Toileting Goal: LTG Patient will perform toileting task (3/3 steps) with assistance level (OT) Description: LTG: Patient will perform toileting task (3/3 steps) with assistance level (OT)  Outcome: Completed/Met Note: Min A at bed level   Problem: RH Functional Use of Upper Extremity Goal: LTG Patient will use RT/LT upper extremity as a (OT) Description: LTG: Patient will use right/left upper extremity as a stabilizer/gross assist/diminished/nondominant/dominant level with assist, with/without cues during functional activity (OT) Outcome: Completed/Met Flowsheets (Taken 10/28/2022 1656) LTG: Pt will use upper extremity in functional activity with assistance level of: Minimal Assistance - Patient > 75%

## 2022-11-12 NOTE — Progress Notes (Signed)
Physical Therapy Session Note  Patient Details  Name: Daniel [name] MRN: [id number] Date of Birth: May 21, 1946  Today's Date: 11/12/2022 PT Individual Time: 1050-1105 PT Individual Time Calculation (min): 15 min   Short Term Goals: Week 2:  PT Short Term Goal 1 (Week 2): Pt will perform supine<>sit with min assist using bed features as needed PT Short Term Goal 2 (Week 2): Pt will maintain sitting balance EOB with CGA while setting up his wheelchair for transfers PT Short Term Goal 3 (Week 2): Pt will perform bed<>chair transfers using LRAD with min assist consistently PT Short Term Goal 4 (Week 2): Pt will perform at least 63ft of wheelchair mobility with CGA  Skilled Therapeutic Interventions/Progress Updates: Pt presents sitting in recliner and pt states unwillingness to participate w/ therapy 2/2 D/C to SNF soon.  Pt encouraged to perform LE there ex in sitting.  Pt performed LAQ, hip flexion, calf raises, and abd/add 3 x 10.  Pt performed R elbow flexion x 5, slow stretch to R shoulder for flexion and into elbow extension.  Pt remained sitting in recliner w/ chair alarm on and all needs in reach.     Therapy Documentation Precautions:  Precautions Precautions: Fall, Other (comment) Precaution Comments: acute on chronic R hemiparesis with R UE and LE posturing/contractures Restrictions Weight Bearing Restrictions: No General: PT Amount of Missed Time (min): 30 Minutes PT Missed Treatment Reason: Patient fatigue;Other (Comment);Patient unwilling to participate (pt being D/C'd to SNF, unwilling to participate.) Vital Signs:  Pain:no pain stated.      Therapy/Group: Individual Therapy  Ladoris Gene 11/12/2022, 11:17 AM

## 2022-11-17 DIAGNOSIS — B370 Candidal stomatitis: Secondary | ICD-10-CM | POA: Diagnosis not present

## 2022-11-18 DIAGNOSIS — I10 Essential (primary) hypertension: Secondary | ICD-10-CM | POA: Diagnosis not present

## 2022-11-18 DIAGNOSIS — I69322 Dysarthria following cerebral infarction: Secondary | ICD-10-CM | POA: Diagnosis not present

## 2022-11-18 DIAGNOSIS — E559 Vitamin D deficiency, unspecified: Secondary | ICD-10-CM | POA: Diagnosis not present

## 2022-11-18 DIAGNOSIS — I69320 Aphasia following cerebral infarction: Secondary | ICD-10-CM | POA: Diagnosis not present

## 2022-11-19 DIAGNOSIS — I69391 Dysphagia following cerebral infarction: Secondary | ICD-10-CM | POA: Diagnosis not present

## 2022-11-19 DIAGNOSIS — I69353 Hemiplegia and hemiparesis following cerebral infarction affecting right non-dominant side: Secondary | ICD-10-CM | POA: Diagnosis not present

## 2022-11-19 DIAGNOSIS — N184 Chronic kidney disease, stage 4 (severe): Secondary | ICD-10-CM | POA: Diagnosis not present

## 2022-11-20 DIAGNOSIS — I739 Peripheral vascular disease, unspecified: Secondary | ICD-10-CM | POA: Diagnosis not present

## 2022-11-20 DIAGNOSIS — N184 Chronic kidney disease, stage 4 (severe): Secondary | ICD-10-CM | POA: Diagnosis not present

## 2022-11-20 DIAGNOSIS — B370 Candidal stomatitis: Secondary | ICD-10-CM | POA: Diagnosis not present

## 2022-11-20 DIAGNOSIS — I10 Essential (primary) hypertension: Secondary | ICD-10-CM | POA: Diagnosis not present

## 2022-11-20 DIAGNOSIS — I69353 Hemiplegia and hemiparesis following cerebral infarction affecting right non-dominant side: Secondary | ICD-10-CM | POA: Diagnosis not present

## 2022-11-20 DIAGNOSIS — I69391 Dysphagia following cerebral infarction: Secondary | ICD-10-CM | POA: Diagnosis not present

## 2022-11-23 DIAGNOSIS — E559 Vitamin D deficiency, unspecified: Secondary | ICD-10-CM | POA: Diagnosis not present

## 2022-11-23 DIAGNOSIS — N184 Chronic kidney disease, stage 4 (severe): Secondary | ICD-10-CM | POA: Diagnosis not present

## 2022-11-23 DIAGNOSIS — B370 Candidal stomatitis: Secondary | ICD-10-CM | POA: Diagnosis not present

## 2022-11-23 DIAGNOSIS — Z79899 Other long term (current) drug therapy: Secondary | ICD-10-CM | POA: Diagnosis not present

## 2022-11-23 DIAGNOSIS — N179 Acute kidney failure, unspecified: Secondary | ICD-10-CM | POA: Diagnosis not present

## 2022-11-25 DIAGNOSIS — N184 Chronic kidney disease, stage 4 (severe): Secondary | ICD-10-CM | POA: Diagnosis not present

## 2022-11-26 DIAGNOSIS — Z20822 Contact with and (suspected) exposure to covid-19: Secondary | ICD-10-CM | POA: Diagnosis not present

## 2022-12-01 DIAGNOSIS — Z20822 Contact with and (suspected) exposure to covid-19: Secondary | ICD-10-CM | POA: Diagnosis not present

## 2022-12-01 DIAGNOSIS — N184 Chronic kidney disease, stage 4 (severe): Secondary | ICD-10-CM | POA: Diagnosis not present

## 2022-12-01 DIAGNOSIS — E860 Dehydration: Secondary | ICD-10-CM | POA: Diagnosis not present

## 2022-12-01 DIAGNOSIS — N179 Acute kidney failure, unspecified: Secondary | ICD-10-CM | POA: Diagnosis not present

## 2022-12-02 DIAGNOSIS — I69353 Hemiplegia and hemiparesis following cerebral infarction affecting right non-dominant side: Secondary | ICD-10-CM | POA: Diagnosis not present

## 2022-12-02 DIAGNOSIS — I69391 Dysphagia following cerebral infarction: Secondary | ICD-10-CM | POA: Diagnosis not present

## 2022-12-02 DIAGNOSIS — E860 Dehydration: Secondary | ICD-10-CM | POA: Diagnosis not present

## 2022-12-02 DIAGNOSIS — U071 COVID-19: Secondary | ICD-10-CM | POA: Diagnosis not present

## 2022-12-02 DIAGNOSIS — N184 Chronic kidney disease, stage 4 (severe): Secondary | ICD-10-CM | POA: Diagnosis not present

## 2022-12-04 DIAGNOSIS — I69391 Dysphagia following cerebral infarction: Secondary | ICD-10-CM | POA: Diagnosis not present

## 2022-12-04 DIAGNOSIS — U071 COVID-19: Secondary | ICD-10-CM | POA: Diagnosis not present

## 2022-12-08 DIAGNOSIS — U071 COVID-19: Secondary | ICD-10-CM | POA: Diagnosis not present

## 2022-12-08 DIAGNOSIS — N184 Chronic kidney disease, stage 4 (severe): Secondary | ICD-10-CM | POA: Diagnosis not present

## 2022-12-15 DIAGNOSIS — R29898 Other symptoms and signs involving the musculoskeletal system: Secondary | ICD-10-CM | POA: Diagnosis not present

## 2022-12-15 DIAGNOSIS — Z8616 Personal history of COVID-19: Secondary | ICD-10-CM | POA: Diagnosis not present

## 2022-12-15 NOTE — Progress Notes (Deleted)
[facility] [address] [place]. [place] [address] [phone]       HOSPITAL FOLLOW UP NOTE  [name] Date of Birth:  Jul 06, 1946 Medical Record Number:  [id number]   Reason for Referral:  hospital stroke follow up    SUBJECTIVE:   CHIEF COMPLAINT:  No chief complaint on file.   HPI:   Daniel [name] is a 77 y.o. who  has a past medical history of Hypertension, Psychosis ([place]), Schizophrenia ([place]) (1974), and Stroke ([place]) (2010).  Patient presented on 10/20/2022 with right sided facial droop and slurred speech. Personally reviewed hospitalization pertinent progress notes, lab work and imaging.  Evaluated by Dr Erlinda Hong.      PERTINENT IMAGING/LABS  CT head Acute 6 mm right subdural hematoma extending along the falx cerebri and tentorium.  MRI  Large area of acute ischemia within the anterior left MCA territory. Bilateral subdural hematomas right greater than left. MRA Occlusion of the left ICA proximal to the skull base. Diminished flow related enhancement in the left MCA. CT head repeat 11/17 stable infarct and small SDH Carotid Doppler total occlusion left ICA, right ICA 40 to 59% stenosis 2D Echo EF 60 to 65%   A1C Lab Results  Component Value Date   HGBA1C 5.5 10/21/2022    Lipid Panel     Component Value Date/Time   CHOL 132 10/21/2022 0435   CHOL 89 (L) 09/01/2016 1128   TRIG 107 10/21/2022 0435   HDL 46 10/21/2022 0435   HDL 48 09/01/2016 1128   CHOLHDL 2.9 10/21/2022 0435   VLDL 21 10/21/2022 0435   LDLCALC 65 10/21/2022 0435   LDLCALC 32 09/01/2016 1128   LABVLDL 9 09/01/2016 1128      ROS:   14 system review of systems performed and negative with exception of those listed in HPI  PMH:  Past Medical History:  Diagnosis Date   Hypertension    Psychosis ([place])    Schizophrenia ([place]) 1974   Stroke ([place]) 2010   Right Hemiparesis    PSH:  Past Surgical History:  Procedure Laterality Date   FEMUR IM NAIL Right  12/12/2019   Procedure: INTRAMEDULLARY (IM) NAIL FEMORAL;  Surgeon: Renette Butters, MD;  Location: [facility];  Service: Orthopedics;  Laterality: Right;    Social History:  Social History   Socioeconomic History   Marital status: Single    Spouse name: Not on file   Number of children: Not on file   Years of education: Not on file   Highest education level: Not on file  Occupational History   Not on file  Tobacco Use   Smoking status: Former    Packs/day: 0.10    Types: Cigarettes   Smokeless tobacco: Never   Tobacco comments:    thinking about.  Cutting back 2-3 cigs per day  Substance and Sexual Activity   Alcohol use: Not Currently    Alcohol/week: 0.0 standard drinks of alcohol   Drug use: Not Currently   Sexual activity: Not on file  Other Topics Concern   Not on file  Social History Narrative   Lives with 2 sisters. Has clear signs of psychosis, being cared for younger sister.    Social Determinants of Health   Financial Resource Strain: Not on file  Food Insecurity: Not on file  Transportation Needs: Not on file  Physical Activity: Not on file  Stress: Not on file  Social Connections: Not on file  Intimate Partner Violence: Not  on file    Family History:  Family History  Problem Relation Age of Onset   Hypertension Mother    CVA Sister    Breast cancer Sister    Prostate cancer Brother     Medications:   Current Outpatient Medications on File Prior to Visit  Medication Sig Dispense Refill   acetaminophen (TYLENOL) 325 MG tablet Take 1-2 tablets (325-650 mg total) by mouth every 4 (four) hours as needed for mild pain.     amLODipine (NORVASC) 10 MG tablet TAKE 1 TABLET BY MOUTH EVERY DAY (Patient taking differently: Take 10 mg by mouth daily.) 30 tablet 0   aspirin EC 81 MG tablet Take 1 tablet (81 mg total) by mouth daily. Swallow whole. 30 tablet 12   atorvastatin (LIPITOR) 40 MG tablet Take 1 tablet (40 mg total) by mouth daily. 90 tablet 3   calcium  carbonate (OS-CAL - DOSED IN MG OF ELEMENTAL CALCIUM) 1250 (500 Ca) MG tablet Take 1 tablet (1,250 mg total) by mouth 2 (two) times daily with a meal. 30 tablet 0   Emollient (EUCERIN) lotion Apply 1 Application topically as needed for dry skin.     ferrous sulfate 325 (65 FE) MG tablet Take 1 tablet (325 mg total) by mouth daily with breakfast. 30 tablet 3   folic acid (FOLVITE) 1 MG tablet TAKE 1 TABLET BY MOUTH EVERY DAY (Patient taking differently: Take 1 mg by mouth daily.) 30 tablet 0   gabapentin (NEURONTIN) 100 MG capsule Take 1 capsule (100 mg total) by mouth at bedtime.     hydrALAZINE (APRESOLINE) 25 MG tablet Take 3 tablets (75 mg total) by mouth every 8 (eight) hours.     magnesium gluconate (MAGONATE) 500 MG tablet Take 0.5 tablets (250 mg total) by mouth daily.     metoprolol tartrate (LOPRESSOR) 50 MG tablet Take 1 tablet (50 mg total) by mouth 2 (two) times daily.     Mouthwashes (MOUTH RINSE) LIQD solution 15 mLs by Mouth Rinse route 4 (four) times daily - after meals and at bedtime.  0   nystatin (MYCOSTATIN) 100000 UNIT/ML suspension Take 5 mLs (500,000 Units total) by mouth 4 (four) times daily. 60 mL 0   senna-docusate (SENOKOT-S) 8.6-50 MG tablet Take 1 tablet by mouth at bedtime as needed for mild constipation.     simethicone (MYLICON) 80 MG chewable tablet Chew 1 tablet (80 mg total) by mouth 4 (four) times daily as needed for flatulence. 30 tablet 0   sodium bicarbonate 650 MG tablet Take 2 tablets (1,300 mg total) by mouth 2 (two) times daily.     tamsulosin (FLOMAX) 0.4 MG CAPS capsule TAKE 1 CAPSULE BY MOUTH EVERY DAY (Patient taking differently: Take 0.4 mg by mouth daily.) 30 capsule 0   Vitamin D, Ergocalciferol, (DRISDOL) 1.25 MG (50000 UNIT) CAPS capsule Take 1 capsule (50,000 Units total) by mouth every 7 (seven) days. 5 capsule 0   No current facility-administered medications on file prior to visit.    Allergies:  No Known  Allergies    OBJECTIVE:  Physical Exam  There were no vitals filed for this visit. There is no height or weight on file to calculate BMI. No results found.     03/29/2018    9:27 AM  Depression screen PHQ 2/9  Decreased Interest 0  Down, Depressed, Hopeless 0  PHQ - 2 Score 0     General: well developed, well nourished, seated, in no evident distress Head: head normocephalic and  atraumatic.   Neck: supple with no carotid or supraclavicular bruits Cardiovascular: regular rate and rhythm, no murmurs Musculoskeletal: no deformity Skin:  no rash/petichiae Vascular:  Normal pulses all extremities   Neurologic Exam Mental Status: Awake and fully alert.  Fluent speech and language.  Oriented to place and time. Recent and remote memory intact. Attention span, concentration and fund of knowledge appropriate. Mood and affect appropriate.  Cranial Nerves: Fundoscopic exam reveals sharp disc margins. Pupils equal, briskly reactive to light. Extraocular movements full without nystagmus. Visual fields full to confrontation. Hearing intact. Facial sensation intact. Face, tongue, palate moves normally and symmetrically.  Motor: Normal bulk and tone. Normal strength in all tested extremity muscles Sensory.: intact to touch , pinprick , position and vibratory sensation.  Coordination: Rapid alternating movements normal in all extremities. Finger-to-nose and heel-to-shin performed accurately bilaterally. Gait and Station: Arises from chair without difficulty. Stance is normal. Gait demonstrates normal stride length and balance with ***. Tandem walk and heel toe ***.  Reflexes: 1+ and symmetric.    NIHSS  *** Modified Rankin  ***    ASSESSMENT: AIZIK BROLIN is a 77 y.o. year old male ***. Vascular risk factors include ***.      PLAN:  Stroke: Acute left MCA ischemic infarct likely due to chronic left ICA occlusion in the setting of AKI on CKD and hypotension  : Residual deficit: ***.  Continue aspirin 81 mg daily  and atorvastatin 74m  for secondary stroke prevention.  Discussed secondary stroke prevention measures and importance of close PCP follow up for aggressive stroke risk factor management. I have gone over the pathophysiology of stroke, warning signs and symptoms, risk factors and their management in some detail with instructions to go to the closest emergency room for symptoms of concern. HTN: BP goal <130/90.  Stable on norvasc, metoprolol and lisinopril per PCP HLD: LDL goal <70. Recent LDL 65. Continue atorvastatin per PCP.  DMII: A1c goal<7.0. Recent A1c 5.5.  Left ICA occlusion: Right ICA stenosis: VVS? CKD: continue close follow up with nephrology   Follow up in *** or call earlier if needed   CC:  GAllenportprovider: Dr. SLeonie ManPCP: PVivi Barrack MD    I spent *** minutes of face-to-face and non-face-to-face time with patient.  This included previsit chart review including review of recent hospitalization, lab review, study review, order entry, electronic health record documentation, patient education regarding recent stroke including etiology, secondary stroke prevention measures and importance of managing stroke risk factors, residual deficits and typical recovery time and answered all other questions to patient satisfaction   ADebbora Presto FDayton Va [facility] GOptions [facility] SystemNe[facility] [address] LaneSKanabGEllsworth [place] [address] Phone [phone] [phone]: This document was prepared with digital dictation and possible smart phrase technology. Any transcriptional errors that result from this process are unintentional.

## 2022-12-16 ENCOUNTER — Inpatient Hospital Stay: Payer: Medicare PPO | Admitting: Family Medicine

## 2022-12-17 DIAGNOSIS — N184 Chronic kidney disease, stage 4 (severe): Secondary | ICD-10-CM | POA: Diagnosis not present

## 2022-12-17 DIAGNOSIS — R29898 Other symptoms and signs involving the musculoskeletal system: Secondary | ICD-10-CM | POA: Diagnosis not present

## 2022-12-17 DIAGNOSIS — I739 Peripheral vascular disease, unspecified: Secondary | ICD-10-CM | POA: Diagnosis not present

## 2022-12-17 DIAGNOSIS — I69353 Hemiplegia and hemiparesis following cerebral infarction affecting right non-dominant side: Secondary | ICD-10-CM | POA: Diagnosis not present

## 2022-12-24 ENCOUNTER — Inpatient Hospital Stay: Payer: Medicare PPO | Admitting: Physical Medicine & Rehabilitation

## 2022-12-24 NOTE — Progress Notes (Deleted)
[facility] [address] [place]. [place] [address] [phone]       HOSPITAL FOLLOW UP NOTE  [name] Date of Birth:  March 04, 1946 Medical Record Number:  [id number]   Reason for Referral:  hospital stroke follow up    SUBJECTIVE:   CHIEF COMPLAINT:  No chief complaint on file.   HPI:   Daniel [name] is a 77 y.o. who  has a past medical history of Hypertension, Psychosis ([place]), Schizophrenia ([place]) (1974), and Stroke ([place]) (2010).  Patient presented on 10/20/2022 with right sided facial droop and slurred speech. MRI showed large area os ischemia in anterior left MCA and bilateral subdural hematomas. Hx prior CVA with residual right sided weakness. Reported 2 falls in the previous 1-2 weeks. Carotid dopplers showed total occlusion of L-ICA, and bilateral ECA with > 50% stenosis.  Dr. Curly Shores felt that stroke was due to carotid stenosis which had progressed to total occlusion. Dr. Marcello Moores consulted and felt that no surgical intervention or follow up needed and recommended avoiding  DAPT and ASA alone likely safe. ASA '81mg'$  started following stable CT 10/23/2022. He was admitted to [facility] 10/27/2022. Per discharge note "At admission, patient required assist with basic ADL task and safety.  He exhibited significant cognitive linguistic deficits with moderate dysarthria, suspicion of anomia and mild to moderate oral dysphagia.  He  has had improvement in activity tolerance, balance, postural control as well as ability to compensate for deficits. He is able to complete ADL tasks with contact-guard to max assist. He requires max verbal cues for recall of speech intelligibility strategies and for effective problem-solving." He was discharged to SNF on 11/12/2022. Personally reviewed hospitalization pertinent progress notes, lab work and imaging.  Evaluated by Dr Erlinda Hong.   Since,    PERTINENT IMAGING/LABS  CT head Acute 6 mm right subdural hematoma extending along the  falx cerebri and tentorium.  MRI  Large area of acute ischemia within the anterior left MCA territory. Bilateral subdural hematomas right greater than left. MRA Occlusion of the left ICA proximal to the skull base. Diminished flow related enhancement in the left MCA. CT head repeat 11/17 stable infarct and small SDH Carotid Doppler total occlusion left ICA, right ICA 40 to 59% stenosis 2D Echo EF 60 to 65%   A1C Lab Results  Component Value Date   HGBA1C 5.5 10/21/2022    Lipid Panel     Component Value Date/Time   CHOL 132 10/21/2022 0435   CHOL 89 (L) 09/01/2016 1128   TRIG 107 10/21/2022 0435   HDL 46 10/21/2022 0435   HDL 48 09/01/2016 1128   CHOLHDL 2.9 10/21/2022 0435   VLDL 21 10/21/2022 0435   LDLCALC 65 10/21/2022 0435   LDLCALC 32 09/01/2016 1128   LABVLDL 9 09/01/2016 1128      ROS:   14 system review of systems performed and negative with exception of those listed in HPI  PMH:  Past Medical History:  Diagnosis Date   Hypertension    Psychosis ([place])    Schizophrenia ([place]) 1974   Stroke ([place]) 2010   Right Hemiparesis    PSH:  Past Surgical History:  Procedure Laterality Date   FEMUR IM NAIL Right 12/12/2019   Procedure: INTRAMEDULLARY (IM) NAIL FEMORAL;  Surgeon: Renette Butters, MD;  Location: [facility];  Service: Orthopedics;  Laterality: Right;    Social History:  Social History   Socioeconomic History   Marital status: Single    Spouse name:  Not on file   Number of children: Not on file   Years of education: Not on file   Highest education level: Not on file  Occupational History   Not on file  Tobacco Use   Smoking status: Former    Packs/day: 0.10    Types: Cigarettes   Smokeless tobacco: Never   Tobacco comments:    thinking about.  Cutting back 2-3 cigs per day  Substance and Sexual Activity   Alcohol use: Not Currently    Alcohol/week: 0.0 standard drinks of alcohol   Drug use: Not Currently   Sexual activity: Not on file   Other Topics Concern   Not on file  Social History Narrative   Lives with 2 sisters. Has clear signs of psychosis, being cared for younger sister.    Social Determinants of Health   Financial Resource Strain: Not on file  Food Insecurity: Not on file  Transportation Needs: Not on file  Physical Activity: Not on file  Stress: Not on file  Social Connections: Not on file  Intimate Partner Violence: Not on file    Family History:  Family History  Problem Relation Age of Onset   Hypertension Mother    CVA Sister    Breast cancer Sister    Prostate cancer Brother     Medications:   Current Outpatient Medications on File Prior to Visit  Medication Sig Dispense Refill   acetaminophen (TYLENOL) 325 MG tablet Take 1-2 tablets (325-650 mg total) by mouth every 4 (four) hours as needed for mild pain.     amLODipine (NORVASC) 10 MG tablet TAKE 1 TABLET BY MOUTH EVERY DAY (Patient taking differently: Take 10 mg by mouth daily.) 30 tablet 0   aspirin EC 81 MG tablet Take 1 tablet (81 mg total) by mouth daily. Swallow whole. 30 tablet 12   atorvastatin (LIPITOR) 40 MG tablet Take 1 tablet (40 mg total) by mouth daily. 90 tablet 3   calcium carbonate (OS-CAL - DOSED IN MG OF ELEMENTAL CALCIUM) 1250 (500 Ca) MG tablet Take 1 tablet (1,250 mg total) by mouth 2 (two) times daily with a meal. 30 tablet 0   Emollient (EUCERIN) lotion Apply 1 Application topically as needed for dry skin.     ferrous sulfate 325 (65 FE) MG tablet Take 1 tablet (325 mg total) by mouth daily with breakfast. 30 tablet 3   folic acid (FOLVITE) 1 MG tablet TAKE 1 TABLET BY MOUTH EVERY DAY (Patient taking differently: Take 1 mg by mouth daily.) 30 tablet 0   gabapentin (NEURONTIN) 100 MG capsule Take 1 capsule (100 mg total) by mouth at bedtime.     hydrALAZINE (APRESOLINE) 25 MG tablet Take 3 tablets (75 mg total) by mouth every 8 (eight) hours.     magnesium gluconate (MAGONATE) 500 MG tablet Take 0.5 tablets (250 mg  total) by mouth daily.     metoprolol tartrate (LOPRESSOR) 50 MG tablet Take 1 tablet (50 mg total) by mouth 2 (two) times daily.     Mouthwashes (MOUTH RINSE) LIQD solution 15 mLs by Mouth Rinse route 4 (four) times daily - after meals and at bedtime.  0   nystatin (MYCOSTATIN) 100000 UNIT/ML suspension Take 5 mLs (500,000 Units total) by mouth 4 (four) times daily. 60 mL 0   senna-docusate (SENOKOT-S) 8.6-50 MG tablet Take 1 tablet by mouth at bedtime as needed for mild constipation.     simethicone (MYLICON) 80 MG chewable tablet Chew 1 tablet (80 mg total)  by mouth 4 (four) times daily as needed for flatulence. 30 tablet 0   sodium bicarbonate 650 MG tablet Take 2 tablets (1,300 mg total) by mouth 2 (two) times daily.     tamsulosin (FLOMAX) 0.4 MG CAPS capsule TAKE 1 CAPSULE BY MOUTH EVERY DAY (Patient taking differently: Take 0.4 mg by mouth daily.) 30 capsule 0   Vitamin D, Ergocalciferol, (DRISDOL) 1.25 MG (50000 UNIT) CAPS capsule Take 1 capsule (50,000 Units total) by mouth every 7 (seven) days. 5 capsule 0   No current facility-administered medications on file prior to visit.    Allergies:  No Known Allergies    OBJECTIVE:  Physical Exam  There were no vitals filed for this visit. There is no height or weight on file to calculate BMI. No results found.     03/29/2018    9:27 AM  Depression screen PHQ 2/9  Decreased Interest 0  Down, Depressed, Hopeless 0  PHQ - 2 Score 0     General: well developed, well nourished, seated, in no evident distress Head: head normocephalic and atraumatic.   Neck: supple with no carotid or supraclavicular bruits Cardiovascular: regular rate and rhythm, no murmurs Musculoskeletal: no deformity Skin:  no rash/petichiae Vascular:  Normal pulses all extremities   Neurologic Exam Mental Status: Awake and fully alert.  Fluent speech and language.  Oriented to place and time. Recent and remote memory intact. Attention span, concentration and  fund of knowledge appropriate. Mood and affect appropriate.  Cranial Nerves: Fundoscopic exam reveals sharp disc margins. Pupils equal, briskly reactive to light. Extraocular movements full without nystagmus. Visual fields full to confrontation. Hearing intact. Facial sensation intact. Face, tongue, palate moves normally and symmetrically.  Motor: Normal bulk and tone. Normal strength in all tested extremity muscles Sensory.: intact to touch , pinprick , position and vibratory sensation.  Coordination: Rapid alternating movements normal in all extremities. Finger-to-nose and heel-to-shin performed accurately bilaterally. Gait and Station: Arises from chair without difficulty. Stance is normal. Gait demonstrates normal stride length and balance with ***. Tandem walk and heel toe ***.  Reflexes: 1+ and symmetric.    NIHSS  *** Modified Rankin  ***    ASSESSMENT: Daniel [name] is a 77 y.o. year old male presenting to ER 10/20/2022 with right sided facial droop and slurred speech. Vascular risk factors include HTN, HLD, CKD, previous CVA, bilateral ICA stenosis, left occlusion, advanced age.      PLAN:  Stroke: Acute left MCA ischemic infarct likely due to chronic left ICA occlusion in the setting of AKI on CKD and hypotension  : Residual deficit: right sided weakness. Continue aspirin 81 mg daily  and atorvastatin '40mg'$   for secondary stroke prevention.  Discussed secondary stroke prevention measures and importance of close PCP follow up for aggressive stroke risk factor management. I have gone over the pathophysiology of stroke, warning signs and symptoms, risk factors and their management in some detail with instructions to go to the closest emergency room for symptoms of concern. HTN: BP goal <130/90.  Stable on norvasc, metoprolol and lisinopril per PCP HLD: LDL goal <70. Recent LDL 65. Continue atorvastatin per PCP.  DMII: A1c goal<7.0. Recent A1c 5.5.  Left ICA occlusion: Right ICA  stenosis: VVS? CKD: continue close follow up with nephrology   Follow up in *** or call earlier if needed   CC:  [place] provider: Dr. Leonie Man PCP: Vivi Barrack, MD    I spent *** minutes of face-to-face and non-face-to-face time with  patient.  This included previsit chart review including review of recent hospitalization, lab review, study review, order entry, electronic health record documentation, patient education regarding recent stroke including etiology, secondary stroke prevention measures and importance of managing stroke risk factors, residual deficits and typical recovery time and answered all other questions to patient satisfaction   Debbora Presto, [facility]  [facility] [address] [place], [place] [address]  Phone [phone] Fax [phone] Note: This document was prepared with digital dictation and possible smart phrase technology. Any transcriptional errors that result from this process are unintentional.

## 2022-12-30 ENCOUNTER — Inpatient Hospital Stay: Payer: Medicare PPO | Admitting: Family Medicine

## 2022-12-31 ENCOUNTER — Telehealth: Payer: Self-pay | Admitting: Family Medicine

## 2022-12-31 NOTE — Telephone Encounter (Signed)
Ok to give VO? 

## 2022-12-31 NOTE — Telephone Encounter (Signed)
Home Health Verbal Orders  Agency:  [facility]  Caller: Daniel [name] and title  Requesting OT/ PT/ Skilled nursing/ Social Work/ Speech:    Reason for Request:  Wont be able to evaluate for PT until next week  Frequency:    HH needs F2F w/in last 30 days

## 2023-01-01 NOTE — Telephone Encounter (Signed)
Ok with me. Please place any necessary orders. 

## 2023-01-01 NOTE — Telephone Encounter (Signed)
Spoke with Tillie Rung at [phone]  Stated VO received, unable to start with visit due to short of stuff due to Covid

## 2023-01-04 DIAGNOSIS — R1310 Dysphagia, unspecified: Secondary | ICD-10-CM | POA: Diagnosis not present

## 2023-01-04 DIAGNOSIS — I69322 Dysarthria following cerebral infarction: Secondary | ICD-10-CM | POA: Diagnosis not present

## 2023-01-04 DIAGNOSIS — I69353 Hemiplegia and hemiparesis following cerebral infarction affecting right non-dominant side: Secondary | ICD-10-CM | POA: Diagnosis not present

## 2023-01-04 DIAGNOSIS — I69391 Dysphagia following cerebral infarction: Secondary | ICD-10-CM | POA: Diagnosis not present

## 2023-01-04 DIAGNOSIS — I129 Hypertensive chronic kidney disease with stage 1 through stage 4 chronic kidney disease, or unspecified chronic kidney disease: Secondary | ICD-10-CM | POA: Diagnosis not present

## 2023-01-04 DIAGNOSIS — I69320 Aphasia following cerebral infarction: Secondary | ICD-10-CM | POA: Diagnosis not present

## 2023-01-04 DIAGNOSIS — I69392 Facial weakness following cerebral infarction: Secondary | ICD-10-CM | POA: Diagnosis not present

## 2023-01-04 DIAGNOSIS — I69328 Other speech and language deficits following cerebral infarction: Secondary | ICD-10-CM | POA: Diagnosis not present

## 2023-01-04 DIAGNOSIS — U071 COVID-19: Secondary | ICD-10-CM | POA: Diagnosis not present

## 2023-01-06 DIAGNOSIS — I69320 Aphasia following cerebral infarction: Secondary | ICD-10-CM | POA: Diagnosis not present

## 2023-01-06 DIAGNOSIS — I69328 Other speech and language deficits following cerebral infarction: Secondary | ICD-10-CM | POA: Diagnosis not present

## 2023-01-06 DIAGNOSIS — R1310 Dysphagia, unspecified: Secondary | ICD-10-CM | POA: Diagnosis not present

## 2023-01-06 DIAGNOSIS — I69391 Dysphagia following cerebral infarction: Secondary | ICD-10-CM | POA: Diagnosis not present

## 2023-01-06 DIAGNOSIS — I69353 Hemiplegia and hemiparesis following cerebral infarction affecting right non-dominant side: Secondary | ICD-10-CM | POA: Diagnosis not present

## 2023-01-06 DIAGNOSIS — I69322 Dysarthria following cerebral infarction: Secondary | ICD-10-CM | POA: Diagnosis not present

## 2023-01-06 DIAGNOSIS — I69392 Facial weakness following cerebral infarction: Secondary | ICD-10-CM | POA: Diagnosis not present

## 2023-01-06 DIAGNOSIS — U071 COVID-19: Secondary | ICD-10-CM | POA: Diagnosis not present

## 2023-01-06 DIAGNOSIS — I129 Hypertensive chronic kidney disease with stage 1 through stage 4 chronic kidney disease, or unspecified chronic kidney disease: Secondary | ICD-10-CM | POA: Diagnosis not present

## 2023-01-06 NOTE — Progress Notes (Unsigned)
[facility] [address] [place]. [place] [address] [phone]       HOSPITAL FOLLOW UP NOTE  [name] Date of Birth:  07/11/1946 Medical Record Number:  [id number]   Reason for Referral:  hospital stroke follow up    SUBJECTIVE:   CHIEF COMPLAINT:  No chief complaint on file.   HPI:   Daniel [name] is [name] 76 y.o. who  has [name] past medical history of Hypertension, Psychosis ([facility]), Schizophrenia ([facility]) (1974), and Stroke ([facility]) (2010).  Patient presented on 10/20/2022 with right sided facial droop and slurred speech. MRI showed large area os ischemia in anterior left MCA and bilateral subdural hematomas. Hx prior CVA with residual right sided weakness. Reported 2 falls in the previous 1-2 weeks. Carotid dopplers showed total occlusion of L-ICA, and bilateral ECA with > 50% stenosis.  Dr. Bhagat felt that stroke was due to carotid stenosis which had progressed to total occlusion. Dr. Thomas consulted and felt that no surgical intervention or follow up needed and recommended avoiding  DAPT and ASA alone likely safe. ASA 81mg started following stable CT 10/23/2022. He was admitted to [facility] 10/27/2022. Per discharge note "At admission, patient required assist with basic ADL task and safety.  He exhibited significant cognitive linguistic deficits with moderate dysarthria, suspicion of anomia and mild to moderate oral dysphagia.  He  has had improvement in activity tolerance, balance, postural control as well as ability to compensate for deficits. He is able to complete ADL tasks with contact-guard to max assist. He requires max verbal cues for recall of speech intelligibility strategies and for effective problem-solving." He was discharged to SNF on 11/12/2022. Personally reviewed hospitalization pertinent progress notes, lab work and imaging.  Evaluated by Dr Xu.   Since,    PERTINENT IMAGING/LABS  CT head Acute 6 mm right subdural hematoma extending along the  falx cerebri and tentorium.  MRI  Large area of acute ischemia within the anterior left MCA territory. Bilateral subdural hematomas right greater than left. MRA Occlusion of the left ICA proximal to the skull base. Diminished flow related enhancement in the left MCA. CT head repeat 11/17 stable infarct and small SDH Carotid Doppler total occlusion left ICA, right ICA 40 to 59% stenosis 2D Echo EF 60 to 65%   A1C Lab Results  Component Value Date   HGBA1C 5.5 10/21/2022    Lipid Panel     Component Value Date/Time   CHOL 132 10/21/2022 0435   CHOL 89 (L) 09/01/2016 1128   TRIG 107 10/21/2022 0435   HDL 46 10/21/2022 0435   HDL 48 09/01/2016 1128   CHOLHDL 2.9 10/21/2022 0435   VLDL 21 10/21/2022 0435   LDLCALC 65 10/21/2022 0435   LDLCALC 32 09/01/2016 1128   LABVLDL 9 09/01/2016 1128      ROS:   14 system review of systems performed and negative with exception of those listed in HPI  PMH:  Past Medical History:  Diagnosis Date   Hypertension    Psychosis ([facility])    Schizophrenia ([facility]) 1974   Stroke ([facility]) 2010   Right Hemiparesis    PSH:  Past Surgical History:  Procedure Laterality Date   FEMUR IM NAIL Right 12/12/2019   Procedure: INTRAMEDULLARY (IM) NAIL FEMORAL;  Surgeon: Murphy, Timothy D, MD;  Location: [facility];  Service: Orthopedics;  Laterality: Right;    Social History:  Social History   Socioeconomic History   Marital status: Single    Spouse name:   Not on file   Number of children: Not on file   Years of education: Not on file   Highest education level: Not on file  Occupational History   Not on file  Tobacco Use   Smoking status: Former    Packs/day: 0.10    Types: Cigarettes   Smokeless tobacco: Never   Tobacco comments:    thinking about.  Cutting back 2-3 cigs per day  Substance and Sexual Activity   Alcohol use: Not Currently    Alcohol/week: 0.0 standard drinks of alcohol   Drug use: Not Currently   Sexual activity: Not on file   Other Topics Concern   Not on file  Social History Narrative   Lives with 2 sisters. Has clear signs of psychosis, being cared for younger sister.    Social Determinants of Health   Financial Resource Strain: Not on file  Food Insecurity: Not on file  Transportation Needs: Not on file  Physical Activity: Not on file  Stress: Not on file  Social Connections: Not on file  Intimate Partner Violence: Not on file    Family History:  Family History  Problem Relation Age of Onset   Hypertension Mother    CVA Sister    Breast cancer Sister    Prostate cancer Brother     Medications:   Current Outpatient Medications on File Prior to Visit  Medication Sig Dispense Refill   acetaminophen (TYLENOL) 325 MG tablet Take 1-2 tablets (325-650 mg total) by mouth every 4 (four) hours as needed for mild pain.     amLODipine (NORVASC) 10 MG tablet TAKE 1 TABLET BY MOUTH EVERY DAY (Patient taking differently: Take 10 mg by mouth Daniel.) 30 tablet 0   aspirin EC 81 MG tablet Take 1 tablet (81 mg total) by mouth Daniel. Swallow whole. 30 tablet 12   atorvastatin (LIPITOR) 40 MG tablet Take 1 tablet (40 mg total) by mouth Daniel. 90 tablet 3   calcium carbonate (OS-CAL - DOSED IN MG OF ELEMENTAL CALCIUM) 1250 (500 Ca) MG tablet Take 1 tablet (1,250 mg total) by mouth 2 (two) times Daniel with [name] meal. 30 tablet 0   Emollient (EUCERIN) lotion Apply 1 Application topically as needed for dry skin.     ferrous sulfate 325 (65 FE) MG tablet Take 1 tablet (325 mg total) by mouth Daniel with breakfast. 30 tablet 3   folic acid (FOLVITE) 1 MG tablet TAKE 1 TABLET BY MOUTH EVERY DAY (Patient taking differently: Take 1 mg by mouth Daniel.) 30 tablet 0   gabapentin (NEURONTIN) 100 MG capsule Take 1 capsule (100 mg total) by mouth at bedtime.     hydrALAZINE (APRESOLINE) 25 MG tablet Take 3 tablets (75 mg total) by mouth every 8 (eight) hours.     magnesium gluconate (MAGONATE) 500 MG tablet Take 0.5 tablets (250 mg  total) by mouth Daniel.     metoprolol tartrate (LOPRESSOR) 50 MG tablet Take 1 tablet (50 mg total) by mouth 2 (two) times Daniel.     Mouthwashes (MOUTH RINSE) LIQD solution 15 mLs by Mouth Rinse route 4 (four) times Daniel - after meals and at bedtime.  0   nystatin (MYCOSTATIN) 100000 UNIT/ML suspension Take 5 mLs (500,000 Units total) by mouth 4 (four) times Daniel. 60 mL 0   senna-docusate (SENOKOT-S) 8.6-50 MG tablet Take 1 tablet by mouth at bedtime as needed for mild constipation.     simethicone (MYLICON) 80 MG chewable tablet Chew 1 tablet (80 mg total)   by mouth 4 (four) times Daniel as needed for flatulence. 30 tablet 0   sodium bicarbonate 650 MG tablet Take 2 tablets (1,300 mg total) by mouth 2 (two) times Daniel.     tamsulosin (FLOMAX) 0.4 MG CAPS capsule TAKE 1 CAPSULE BY MOUTH EVERY DAY (Patient taking differently: Take 0.4 mg by mouth Daniel.) 30 capsule 0   Vitamin D, Ergocalciferol, (DRISDOL) 1.25 MG (50000 UNIT) CAPS capsule Take 1 capsule (50,000 Units total) by mouth every 7 (seven) days. 5 capsule 0   No current facility-administered medications on file prior to visit.    Allergies:  No Known Allergies    OBJECTIVE:  Physical Exam  There were no vitals filed for this visit. There is no height or weight on file to calculate BMI. No results found.     03/29/2018    9:27 AM  Depression screen PHQ 2/9  Decreased Interest 0  Down, Depressed, Hopeless 0  PHQ - 2 Score 0     General: well developed, well nourished, seated, in no evident distress Head: head normocephalic and atraumatic.   Neck: supple with no carotid or supraclavicular bruits Cardiovascular: regular rate and rhythm, no murmurs Musculoskeletal: no deformity Skin:  no rash/petichiae Vascular:  Normal pulses all extremities   Neurologic Exam Mental Status: Awake and fully alert.  Fluent speech and language.  Oriented to place and time. Recent and remote memory intact. Attention span, concentration and  fund of knowledge appropriate. Mood and affect appropriate.  Cranial Nerves: Fundoscopic exam reveals sharp disc margins. Pupils equal, briskly reactive to light. Extraocular movements full without nystagmus. Visual fields full to confrontation. Hearing intact. Facial sensation intact. Face, tongue, palate moves normally and symmetrically.  Motor: Normal bulk and tone. Normal strength in all tested extremity muscles Sensory.: intact to touch , pinprick , position and vibratory sensation.  Coordination: Rapid alternating movements normal in all extremities. Finger-to-nose and heel-to-shin performed accurately bilaterally. Gait and Station: Arises from chair without difficulty. Stance is normal. Gait demonstrates normal stride length and balance with ***. Tandem walk and heel toe ***.  Reflexes: 1+ and symmetric.    NIHSS  *** Modified Rankin  ***    ASSESSMENT: Daniel [name] is [name] 76 y.o. year old male presenting to ER 10/20/2022 with right sided facial droop and slurred speech. Vascular risk factors include HTN, HLD, CKD, previous CVA, bilateral ICA stenosis, left occlusion, advanced age.      PLAN:  Stroke: Acute left MCA ischemic infarct likely due to chronic left ICA occlusion in the setting of AKI on CKD and hypotension  : Residual deficit: right sided weakness. Continue aspirin 81 mg Daniel  and atorvastatin 40mg  for secondary stroke prevention.  Discussed secondary stroke prevention measures and importance of close PCP follow up for aggressive stroke risk factor management. I have gone over the pathophysiology of stroke, warning signs and symptoms, risk factors and their management in some detail with instructions to go to the closest emergency room for symptoms of concern. HTN: BP goal <130/90.  Stable on norvasc, metoprolol and lisinopril per PCP HLD: LDL goal <70. Recent LDL 65. Continue atorvastatin per PCP.  DMII: A1c goal<7.0. Recent A1c 5.5.  Left ICA occlusion: Right ICA  stenosis: VVS? CKD: continue close follow up with nephrology   Follow up in *** or call earlier if needed   CC:  [facility] provider: Dr. Sethi PCP: Parker, Caleb M, MD    I spent *** minutes of face-to-face and non-face-to-face time with   patient.  This included previsit chart review including review of recent hospitalization, lab review, study review, order entry, electronic health record documentation, patient education regarding recent stroke including etiology, secondary stroke prevention measures and importance of managing stroke risk factors, residual deficits and typical recovery time and answered all other questions to patient satisfaction   Ashey Tramontana, FNP-C  [facility] [address] [place], [place] [address]  Phone [phone] Fax [phone] Note: This document was prepared with digital dictation and possible smart phrase technology. Any transcriptional errors that result from this process are unintentional.       

## 2023-01-06 NOTE — Patient Instructions (Incomplete)

## 2023-01-07 ENCOUNTER — Ambulatory Visit (INDEPENDENT_AMBULATORY_CARE_PROVIDER_SITE_OTHER): Payer: Medicare PPO | Admitting: Family Medicine

## 2023-01-07 ENCOUNTER — Encounter: Payer: Self-pay | Admitting: Family Medicine

## 2023-01-07 VITALS — BP 147/67 | HR 66 | Ht 72.0 in

## 2023-01-07 DIAGNOSIS — I63512 Cerebral infarction due to unspecified occlusion or stenosis of left middle cerebral artery: Secondary | ICD-10-CM | POA: Diagnosis not present

## 2023-01-08 ENCOUNTER — Telehealth: Payer: Self-pay | Admitting: Family Medicine

## 2023-01-08 DIAGNOSIS — I69322 Dysarthria following cerebral infarction: Secondary | ICD-10-CM | POA: Diagnosis not present

## 2023-01-08 DIAGNOSIS — I129 Hypertensive chronic kidney disease with stage 1 through stage 4 chronic kidney disease, or unspecified chronic kidney disease: Secondary | ICD-10-CM | POA: Diagnosis not present

## 2023-01-08 DIAGNOSIS — R1310 Dysphagia, unspecified: Secondary | ICD-10-CM | POA: Diagnosis not present

## 2023-01-08 DIAGNOSIS — U071 COVID-19: Secondary | ICD-10-CM | POA: Diagnosis not present

## 2023-01-08 DIAGNOSIS — I69392 Facial weakness following cerebral infarction: Secondary | ICD-10-CM | POA: Diagnosis not present

## 2023-01-08 DIAGNOSIS — I69328 Other speech and language deficits following cerebral infarction: Secondary | ICD-10-CM | POA: Diagnosis not present

## 2023-01-08 DIAGNOSIS — I69353 Hemiplegia and hemiparesis following cerebral infarction affecting right non-dominant side: Secondary | ICD-10-CM | POA: Diagnosis not present

## 2023-01-08 DIAGNOSIS — I69320 Aphasia following cerebral infarction: Secondary | ICD-10-CM | POA: Diagnosis not present

## 2023-01-08 DIAGNOSIS — I69391 Dysphagia following cerebral infarction: Secondary | ICD-10-CM | POA: Diagnosis not present

## 2023-01-08 NOTE — Telephone Encounter (Signed)
..  Home Health Verbal Orders  Agency:  [facility]  Caller: Lonzo Candy and title Ph# [phone] -Speech Therapist  Requesting Speech:  Speech Therapy Evaluation be moved to next week  Reason for Request:  Scheduling conflicts  Frequency:    HH needs F2F w/in last 30 days

## 2023-01-11 NOTE — Telephone Encounter (Signed)
Ok with me. Please place any necessary orders. 

## 2023-01-11 NOTE — Telephone Encounter (Signed)
LMOVM giving OK verbal orders

## 2023-01-11 NOTE — Telephone Encounter (Signed)
Ok to give VO?

## 2023-01-12 ENCOUNTER — Telehealth: Payer: Self-pay | Admitting: Family Medicine

## 2023-01-12 DIAGNOSIS — I69328 Other speech and language deficits following cerebral infarction: Secondary | ICD-10-CM | POA: Diagnosis not present

## 2023-01-12 DIAGNOSIS — I129 Hypertensive chronic kidney disease with stage 1 through stage 4 chronic kidney disease, or unspecified chronic kidney disease: Secondary | ICD-10-CM | POA: Diagnosis not present

## 2023-01-12 DIAGNOSIS — I69391 Dysphagia following cerebral infarction: Secondary | ICD-10-CM | POA: Diagnosis not present

## 2023-01-12 DIAGNOSIS — I69353 Hemiplegia and hemiparesis following cerebral infarction affecting right non-dominant side: Secondary | ICD-10-CM | POA: Diagnosis not present

## 2023-01-12 DIAGNOSIS — I69322 Dysarthria following cerebral infarction: Secondary | ICD-10-CM | POA: Diagnosis not present

## 2023-01-12 DIAGNOSIS — I69392 Facial weakness following cerebral infarction: Secondary | ICD-10-CM | POA: Diagnosis not present

## 2023-01-12 DIAGNOSIS — I69320 Aphasia following cerebral infarction: Secondary | ICD-10-CM | POA: Diagnosis not present

## 2023-01-12 DIAGNOSIS — U071 COVID-19: Secondary | ICD-10-CM | POA: Diagnosis not present

## 2023-01-12 DIAGNOSIS — R1310 Dysphagia, unspecified: Secondary | ICD-10-CM | POA: Diagnosis not present

## 2023-01-12 NOTE — Telephone Encounter (Signed)
.  Home Health Certification or Plan of Care Tracking  Is this a Certification or Plan of Care? Yes  [place]: [place]  Order Number:  [phone]  Has charge sheet been attached? Yes  Where has form been placed:  In provider's box  Faxed to:   [phone]

## 2023-01-13 NOTE — Telephone Encounter (Signed)
Form placed in PCP office to be review

## 2023-01-14 DIAGNOSIS — I129 Hypertensive chronic kidney disease with stage 1 through stage 4 chronic kidney disease, or unspecified chronic kidney disease: Secondary | ICD-10-CM | POA: Diagnosis not present

## 2023-01-14 DIAGNOSIS — I69392 Facial weakness following cerebral infarction: Secondary | ICD-10-CM | POA: Diagnosis not present

## 2023-01-14 DIAGNOSIS — I69391 Dysphagia following cerebral infarction: Secondary | ICD-10-CM | POA: Diagnosis not present

## 2023-01-14 DIAGNOSIS — R1310 Dysphagia, unspecified: Secondary | ICD-10-CM | POA: Diagnosis not present

## 2023-01-14 DIAGNOSIS — I69320 Aphasia following cerebral infarction: Secondary | ICD-10-CM | POA: Diagnosis not present

## 2023-01-14 DIAGNOSIS — I69353 Hemiplegia and hemiparesis following cerebral infarction affecting right non-dominant side: Secondary | ICD-10-CM | POA: Diagnosis not present

## 2023-01-14 DIAGNOSIS — U071 COVID-19: Secondary | ICD-10-CM | POA: Diagnosis not present

## 2023-01-14 DIAGNOSIS — I69322 Dysarthria following cerebral infarction: Secondary | ICD-10-CM | POA: Diagnosis not present

## 2023-01-14 DIAGNOSIS — I69328 Other speech and language deficits following cerebral infarction: Secondary | ICD-10-CM | POA: Diagnosis not present

## 2023-01-15 ENCOUNTER — Telehealth: Payer: Self-pay | Admitting: Family Medicine

## 2023-01-15 NOTE — Telephone Encounter (Signed)
  Home Health Verbal Orders  Agency:  [facility]  Caller: [place] and title  Ph#   [phone]  OT  Requesting a Hold Order for all [place] services at this time until Patient's sister provides proof that the Bed Bugs have been exterminated (has 30 days to provide proof or else Patient will be discharged from services)   Reason for Request:  Bed Bugs    HH needs F2F w/in last 30 days

## 2023-01-15 NOTE — Telephone Encounter (Signed)
Form Faxed to:   [phone]

## 2023-01-15 NOTE — Telephone Encounter (Signed)
Daniel [name] with [facility] wanted to inform provider about a situation. She visited pt today & she believes he has bed bugs.

## 2023-01-15 NOTE — Telephone Encounter (Signed)
See note

## 2023-01-28 ENCOUNTER — Encounter: Payer: Self-pay | Admitting: Family Medicine

## 2023-01-28 ENCOUNTER — Ambulatory Visit: Payer: Medicare PPO | Admitting: Family Medicine

## 2023-01-28 ENCOUNTER — Ambulatory Visit (INDEPENDENT_AMBULATORY_CARE_PROVIDER_SITE_OTHER): Payer: Medicare PPO | Admitting: Family Medicine

## 2023-01-28 VITALS — BP 138/77 | HR 81 | Temp 97.8°F | Ht 72.0 in

## 2023-01-28 DIAGNOSIS — I129 Hypertensive chronic kidney disease with stage 1 through stage 4 chronic kidney disease, or unspecified chronic kidney disease: Secondary | ICD-10-CM

## 2023-01-28 DIAGNOSIS — I10 Essential (primary) hypertension: Secondary | ICD-10-CM

## 2023-01-28 DIAGNOSIS — N183 Chronic kidney disease, stage 3 unspecified: Secondary | ICD-10-CM

## 2023-01-28 DIAGNOSIS — E785 Hyperlipidemia, unspecified: Secondary | ICD-10-CM

## 2023-01-28 DIAGNOSIS — F209 Schizophrenia, unspecified: Secondary | ICD-10-CM

## 2023-01-28 DIAGNOSIS — I69051 Hemiplegia and hemiparesis following nontraumatic subarachnoid hemorrhage affecting right dominant side: Secondary | ICD-10-CM | POA: Diagnosis not present

## 2023-01-28 DIAGNOSIS — I739 Peripheral vascular disease, unspecified: Secondary | ICD-10-CM

## 2023-01-28 DIAGNOSIS — D649 Anemia, unspecified: Secondary | ICD-10-CM

## 2023-01-28 MED ORDER — HYDRALAZINE HCL 25 MG PO TABS: 75.0000 mg | ORAL_TABLET | Freq: Three times a day (TID) | ORAL | 3 refills | Status: DC

## 2023-01-28 MED ORDER — LISINOPRIL 20 MG PO TABS: 20.0000 mg | ORAL_TABLET | Freq: Every day | ORAL | 0 refills | Status: DC

## 2023-01-28 MED ORDER — FOLIC ACID 1 MG PO TABS: 1.0000 mg | ORAL_TABLET | Freq: Every day | ORAL | 3 refills | Status: AC

## 2023-01-28 MED ORDER — VITAMIN D (ERGOCALCIFEROL) 1.25 MG (50000 UNIT) PO CAPS: 50000.0000 [IU] | ORAL_CAPSULE | ORAL | 3 refills | Status: AC

## 2023-01-28 MED ORDER — AMLODIPINE BESYLATE 10 MG PO TABS: 10.0000 mg | ORAL_TABLET | Freq: Every day | ORAL | 3 refills | Status: AC

## 2023-01-28 MED ORDER — METOPROLOL TARTRATE 50 MG PO TABS: 50.0000 mg | ORAL_TABLET | Freq: Two times a day (BID) | ORAL | 3 refills | Status: AC

## 2023-01-28 MED ORDER — TAMSULOSIN HCL 0.4 MG PO CAPS: 0.4000 mg | ORAL_CAPSULE | Freq: Every day | ORAL | 3 refills | Status: AC

## 2023-01-28 MED ORDER — FERROUS SULFATE 325 (65 FE) MG PO TABS: 325.0000 mg | ORAL_TABLET | Freq: Every day | ORAL | 3 refills | Status: AC

## 2023-01-28 MED ORDER — ATORVASTATIN CALCIUM 40 MG PO TABS: 40.0000 mg | ORAL_TABLET | Freq: Every day | ORAL | 3 refills | Status: AC

## 2023-01-28 NOTE — Assessment & Plan Note (Signed)
Continue aspirin 81 mg daily and Lipitor 40 mg daily.

## 2023-01-28 NOTE — Assessment & Plan Note (Addendum)
Unfortunately suffered another stroke since our last visit 2 and half years ago.  Also was found to have subdural hematoma at that time and his dual antiplatelets were stopped.  He is now on statin and aspirin alone.  We are also addressing his other risk factors as above.  Blood pressure is at goal today and last lipid panel was well-controlled on Lipitor.  Last A1c was well-controlled as well.  He will come back later this year for annual exam and we will recheck labs at that time.  Home health has been ordered and have been working with him.  He also has his sister who is living with him to help perform ADLs.

## 2023-01-28 NOTE — Assessment & Plan Note (Signed)
Stable off medications.  

## 2023-01-28 NOTE — Progress Notes (Signed)
   Daniel [name] is a 77 y.o. male who presents today for an office visit.  Assessment/Plan:  Chronic Problems Addressed Today: HTN (hypertension) Reconciled patient's medications today.  We will refill what he has been on.  His blood pressure is at goal today.  Will continue lisinopril 20 mg daily, amlodipine 10 mg daily, Metroprolol tartrate 50 mg twice daily, and hydralazine 75 mg 3 times daily.  Hyperlipidemia Last lipid panel was at goal.  He is on Lipitor 40 mg daily.  He will come back later this year and we will recheck lipids at that time.  Benign hypertension with CKD (chronic kidney disease) stage III (HCC) Stable on last blood draw.  We will recheck next office visit.  He is currently on ACE inhibitor for renal protection.  Will avoid nephrotoxic medications going forward.  Schizophrenia ([place]) Stable off medications.   Hemiplegia, late effect of cerebrovascular disease ([place]) Unfortunately suffered another stroke since our last visit 2 and half years ago.  Also was found to have subdural hematoma at that time and his dual antiplatelets were stopped.  He is now on statin and aspirin alone.  We are also addressing his other risk factors as above.  Blood pressure is at goal today and last lipid panel was well-controlled on Lipitor.  Last A1c was well-controlled as well.  He will come back later this year for annual exam and we will recheck labs at that time.  Home health has been ordered and have been working with him.  He also has his sister who is living with him to help perform ADLs.  Peripheral vascular disease (HCC) Continue aspirin 81 mg daily and Lipitor 40 mg daily.       Subjective:  HPI:  See A/p for status of chronic conditions.  Patient is here for follow up today. He was last seen here about 2.5 years ago. He states that he is doing well today and needs medication refills. He has no acute concerns or questions today.  Since our last visit he suffered another  stroke a few months ago.  He was admitted from 10/28/2023 until 11/13/2023.  He was discharged to SNF.  He was discharged home with home health.  He followed up with neurology 3 weeks ago.  Patient is not able to provide much history today however notes in the chart indicate that he is now living at home with his sister who helps with ADLs and medication administration.       Objective:  Physical Exam: BP 138/77   Pulse 81   Temp 97.8 F (36.6 C) (Temporal)   Ht 6' (1.829 m)   SpO2 99%   BMI 19.17 kg/m   Gen: No acute distress, resting comfortably CV: Regular rate and rhythm with no murmurs appreciated Pulm: Normal work of breathing, clear to auscultation bilaterally with no crackles, wheezes, or rhonchi Neuro: In wheelchair.  Psych: Normal affect and thought content      Daysi Boggan M. [name], MD 01/28/2023 11:54 AM

## 2023-01-28 NOTE — Assessment & Plan Note (Signed)
Reconciled patient's medications today.  We will refill what he has been on.  His blood pressure is at goal today.  Will continue lisinopril 20 mg daily, amlodipine 10 mg daily, Metroprolol tartrate 50 mg twice daily, and hydralazine 75 mg 3 times daily.

## 2023-01-28 NOTE — Assessment & Plan Note (Signed)
Stable on last blood draw.  We will recheck next office visit.  He is currently on ACE inhibitor for renal protection.  Will avoid nephrotoxic medications going forward.

## 2023-01-28 NOTE — Patient Instructions (Signed)
It was very nice to see you today!  I will refill your medications today.  Please come back in 6 months for your annual physical with labs.  Come back sooner if needed.  Take care, Dr [name]  PLEASE NOTE:  If you had any lab tests, please let us know if you have not heard back within a few days. You may see your results on mychart before we have a chance to review them but we will give you a call once they are reviewed by [place].   If we ordered any referrals today, please let us know if you have not heard from their office within the next week.   If you had any urgent prescriptions sent in today, please check with the pharmacy within an hour of our visit to make sure the prescription was transmitted appropriately.   Please try these tips to maintain a healthy lifestyle:  Eat at least 3 REAL meals and 1-2 snacks per day.  Aim for no more than 5 hours between eating.  If you eat breakfast, please do so within one hour of getting up.   Each meal should contain half fruits/vegetables, one quarter protein, and one quarter carbs (no bigger than a computer mouse)  Cut down on sweet beverages. This includes juice, soda, and sweet tea.   Drink at least 1 glass of water with each meal and aim for at least 8 glasses per day  Exercise at least 150 minutes every week.

## 2023-01-28 NOTE — Assessment & Plan Note (Signed)
Last lipid panel was at goal.  He is on Lipitor 40 mg daily.  He will come back later this year and we will recheck lipids at that time.

## 2023-04-27 ENCOUNTER — Other Ambulatory Visit: Payer: Self-pay | Admitting: Family Medicine

## 2023-05-19 ENCOUNTER — Encounter: Payer: Self-pay | Admitting: Neurology

## 2023-05-19 ENCOUNTER — Telehealth: Payer: Self-pay | Admitting: Neurology

## 2023-05-19 ENCOUNTER — Ambulatory Visit (INDEPENDENT_AMBULATORY_CARE_PROVIDER_SITE_OTHER): Payer: Medicare PPO | Admitting: Neurology

## 2023-05-19 VITALS — BP 149/73 | HR 77 | Ht 72.0 in

## 2023-05-19 DIAGNOSIS — I63412 Cerebral infarction due to embolism of left middle cerebral artery: Secondary | ICD-10-CM | POA: Diagnosis not present

## 2023-05-19 DIAGNOSIS — I6522 Occlusion and stenosis of left carotid artery: Secondary | ICD-10-CM

## 2023-05-19 DIAGNOSIS — G8110 Spastic hemiplegia affecting unspecified side: Secondary | ICD-10-CM

## 2023-05-19 NOTE — Telephone Encounter (Signed)
Referral faxed to Neurorehabilitation: Phone: [phone] Fax: [phone] 

## 2023-05-19 NOTE — Patient Instructions (Signed)
I had a long d/w patient and his cousin about his recent stroke, residual spastic hemiplegia, carotid occlusion risk for recurrent stroke/TIAs, personally independently reviewed imaging studies and stroke evaluation results and answered questions.Continue aspirin 81 mg daily  for secondary stroke prevention and maintain strict control of hypertension with blood pressure goal below 130/90, diabetes with hemoglobin A1c goal below 6.5% and lipids with LDL cholesterol goal below 70 mg/dL. I also advised the patient to eat a healthy diet with plenty of whole grains, cereals, fruits and vegetables, exercise regularly and maintain ideal body weight .recommend referral to outpatient physical occupational and speech therapy.  Followup in the future with my nurse practitioner in 6 months or call earlier if necessary.  Stroke Prevention Some medical conditions and behaviors can lead to a higher chance of having a stroke. You can help prevent a stroke by eating healthy, exercising, not smoking, and managing any medical conditions you have. Stroke is a leading cause of functional impairment. Primary prevention is particularly important because a majority of strokes are first-time events. Stroke changes the lives of not only those who experience a stroke but also their family and other caregivers. How can this condition affect me? A stroke is a medical emergency and should be treated right away. A stroke can lead to brain damage and can sometimes be life-threatening. If a person gets medical treatment right away, there is a better chance of surviving and recovering from a stroke. What can increase my risk? The following medical conditions may increase your risk of a stroke: Cardiovascular disease. High blood pressure (hypertension). Diabetes. High cholesterol. Sickle cell disease. Blood clotting disorders (hypercoagulable state). Obesity. Sleep disorders (obstructive sleep apnea). Other risk factors  include: Being older than age 64. Having a history of blood clots, stroke, or mini-stroke (transient ischemic attack, TIA). Genetic factors, such as race, ethnicity, or a family history of stroke. Smoking cigarettes or using other tobacco products. Taking birth control pills, especially if you also use tobacco. Heavy use of alcohol or drugs, especially cocaine and methamphetamine. Physical inactivity. What actions can I take to prevent this? Manage your health conditions High cholesterol levels. Eating a healthy diet is important for preventing high cholesterol. If cholesterol cannot be managed through diet alone, you may need to take medicines. Take any prescribed medicines to control your cholesterol as told by your health care provider. Hypertension. To reduce your risk of stroke, try to keep your blood pressure below 130/80. Eating a healthy diet and exercising regularly are important for controlling blood pressure. If these steps are not enough to manage your blood pressure, you may need to take medicines. Take any prescribed medicines to control hypertension as told by your health care provider. Ask your health care provider if you should monitor your blood pressure at home. Have your blood pressure checked every year, even if your blood pressure is normal. Blood pressure increases with age and some medical conditions. Diabetes. Eating a healthy diet and exercising regularly are important parts of managing your blood sugar (glucose). If your blood sugar cannot be managed through diet and exercise, you may need to take medicines. Take any prescribed medicines to control your diabetes as told by your health care provider. Get evaluated for obstructive sleep apnea. Talk to your health care provider about getting a sleep evaluation if you snore a lot or have excessive sleepiness. Make sure that any other medical conditions you have, such as atrial fibrillation or atherosclerosis, are  managed. Nutrition Follow instructions from  your health care provider about what to eat or drink to help manage your health condition. These instructions may include: Reducing your daily calorie intake. Limiting how much salt (sodium) you use to 1,500 milligrams (mg) each day. Using only healthy fats for cooking, such as olive oil, canola oil, or sunflower oil. Eating healthy foods. You can do this by: Choosing foods that are high in fiber, such as whole grains, and fresh fruits and vegetables. Eating at least 5 servings of fruits and vegetables a day. Try to fill one-half of your plate with fruits and vegetables at each meal. Choosing lean protein foods, such as lean cuts of meat, poultry without skin, fish, tofu, beans, and nuts. Eating low-fat dairy products. Avoiding foods that are high in sodium. This can help lower blood pressure. Avoiding foods that have saturated fat, trans fat, and cholesterol. This can help prevent high cholesterol. Avoiding processed and prepared foods. Counting your daily carbohydrate intake.  Lifestyle If you drink alcohol: Limit how much you have to: 0-1 drink a day for women who are not pregnant. 0-2 drinks a day for men. Know how much alcohol is in your drink. In the [place] one drink equals one 12 oz bottle of beer ( ), one 5 oz glass of wine ( ), or one 1 oz glass of hard liquor (44mL). Do not use any products that contain nicotine or tobacco. These products include cigarettes, chewing tobacco, and vaping devices, such as e-cigarettes. If you need help quitting, ask your health care provider. Avoid secondhand smoke. Do not use drugs. Activity  Try to stay at a healthy weight. Get at least 30 minutes of exercise on most days, such as: Fast walking. Biking. Swimming. Medicines Take over-the-counter and prescription medicines only as told by your health care provider. Aspirin or blood thinners (antiplatelets or anticoagulants) may be recommended  to reduce your risk of forming blood clots that can lead to stroke. Avoid taking birth control pills. Talk to your health care provider about the risks of taking birth control pills if: You are over 35 years old. You smoke. You get very bad headaches. You have had a blood clot. Where to find more information American Stroke Association: www.strokeassociation.org Get help right away if: You or a loved one has any symptoms of a stroke. "BE FAST" is an easy way to remember the main warning signs of a stroke: B - Balance. Signs are dizziness, sudden trouble walking, or loss of balance. E - Eyes. Signs are trouble seeing or a sudden change in vision. F - Face. Signs are sudden weakness or numbness of the face, or the face or eyelid drooping on one side. A - Arms. Signs are weakness or numbness in an arm. This happens suddenly and usually on one side of the body. S - Speech. Signs are sudden trouble speaking, slurred speech, or trouble understanding what people say. T - Time. Time to call emergency services. Write down what time symptoms started. You or a loved one has other signs of a stroke, such as: A sudden, severe headache with no known cause. Nausea or vomiting. Seizure. These symptoms may represent a serious problem that is an emergency. Do not wait to see if the symptoms will go away. Get medical help right away. Call your local emergency services (911 in the [place] Do not drive yourself to the hospital. Summary You can help to prevent a stroke by eating healthy, exercising, not smoking, limiting alcohol intake, and managing any medical conditions you may  have. Do not use any products that contain nicotine or tobacco. These include cigarettes, chewing tobacco, and vaping devices, such as e-cigarettes. If you need help quitting, ask your health care provider. Remember "BE FAST" for warning signs of a stroke. Get help right away if you or a loved one has any of these signs. This information  is not intended to replace advice given to you by your health care provider. Make sure you discuss any questions you have with your health care provider. Document Revised: 10/26/2022 Document Reviewed: 10/26/2022 Elsevier Patient Education  2024 [employer].

## 2023-05-19 NOTE — Telephone Encounter (Signed)
Referral faxed to [facility]habil[facility]  Phone: [phone]  Fax: [phone]

## 2023-05-19 NOTE — Progress Notes (Signed)
[facility] [address] [place]. [place] [address] [phone]       OFFICE CONSULT NOTE  Daniel [name] Date of Birth:  1946/10/13 Medical Record Number:  [id number]   Referring MD: Marvel Plan  Reason for Referral: Stroke follow-up  HPI: Daniel [name] is a 77 year old African-American male seen today for initial office consultation visit for stroke follow-up.  He is accompanied by his third cousin.  History is obtained from them and review of electronic medical records.  I personally reviewed pertinent available imaging films in PACS.  He presented on 10/20/2022 with 2-day history of worsening of his existing right sided weakness with increasing falls out of his wheelchair trying to help with transfers.  He had prior history of left MCA stroke in 2012 due to left carotid occlusion with residual right hemiparesis and some cognitive impairment and slurred speech. Due to persistent symptoms he was brought to the emergency room for evaluation where MRI was obtained which showed small acute subdural hemorrhage but also new left MCA territory infarct superimposed upon the origin.  MR angiogram showed chronic proximal left carotid occlusion.  Carotid ultrasound showed 40 to 59% right ICA and chronic left.  2D echo showed ejection fraction of 5%.  LDL cholesterol was optimal at 65 mg percent and hemoglobin A1c was 5.5.  Neurosurgery did not recommend any surgical intervention for the small subdural and was started on aspirin alone and transferred to inpatient rehab.  Patient is currently living at home with his sister.  He has not began treatment on the right side remains wheelchair-bound.  He is currently not getting any physical occupational and speech therapy.  He is able to feed himself but needs help with cleaning the floor and going to the restroom and bathing.  Remains on aspirin for tolerating well without bruising or bleeding.  His blood pressure is under good control.  No  stroke or TIA symptoms.  His cognitive impairment is stable and nonprogressive.  ROS:   14 system review of systems is positive for dysarthria,, weakness, difficulty walking all other systems negative  PMH:  Past Medical History:  Diagnosis Date   Hypertension    Psychosis (HCC)    Schizophrenia (HCC) 1974   Stroke (HCC) 2010   Right Hemiparesis    Social History:  Social History   Socioeconomic History   Marital status: Single    Spouse name: Not on file   Number of children: Not on file   Years of education: Not on file   Highest education level: Not on file  Occupational History   Not on file  Tobacco Use   Smoking status: Former    Packs/day: .1    Types: Cigarettes   Smokeless tobacco: Never   Tobacco comments:    thinking about.  Cutting back 2-3 cigs per day  Substance and Sexual Activity   Alcohol use: Not Currently    Alcohol/week: 0.0 standard drinks of alcohol   Drug use: Not Currently   Sexual activity: Not on file  Other Topics Concern   Not on file  Social History Narrative   Lives with 2 sisters. Has clear signs of psychosis, being cared for younger sister.    Social Determinants of Health   Financial Resource Strain: Not on file  Food Insecurity: Not on file  Transportation Needs: Not on file  Physical Activity: Not on file  Stress: Not on file  Social Connections: Not on file  Intimate Partner Violence: Not on  file    Medications:   Current Outpatient Medications on File Prior to Visit  Medication Sig Dispense Refill   acetaminophen (TYLENOL) 325 MG tablet Take 1-2 tablets (325-650 mg total) by mouth every 4 (four) hours as needed for mild pain.     amLODipine (NORVASC) 10 MG tablet Take 1 tablet (10 mg total) by mouth daily. 90 tablet 3   aspirin EC 81 MG tablet Take 1 tablet (81 mg total) by mouth daily. Swallow whole. 30 tablet 12   atorvastatin (LIPITOR) 40 MG tablet Take 1 tablet (40 mg total) by mouth daily. 90 tablet 3   calcium  carbonate (OS-CAL - DOSED IN MG OF ELEMENTAL CALCIUM) 1250 (500 Ca) MG tablet Take 1 tablet (1,250 mg total) by mouth 2 (two) times daily with a meal. 30 tablet 0   Emollient (EUCERIN) lotion Apply 1 Application topically as needed for dry skin.     ferrous sulfate 325 (65 FE) MG tablet Take 1 tablet (325 mg total) by mouth daily with breakfast. 90 tablet 3   folic acid (FOLVITE) 1 MG tablet Take 1 tablet (1 mg total) by mouth daily. 90 tablet 3   hydrALAZINE (APRESOLINE) 25 MG tablet TAKE 3 TABLETS (75 MG TOTAL) BY MOUTH EVERY 8 HOURS 810 tablet 1   lisinopril (ZESTRIL) 20 MG tablet TAKE 1 TABLET BY MOUTH EVERY DAY 90 tablet 0   magnesium gluconate (MAGONATE) 500 MG tablet Take 0.5 tablets (250 mg total) by mouth daily.     metoprolol tartrate (LOPRESSOR) 50 MG tablet Take 1 tablet (50 mg total) by mouth 2 (two) times daily. 180 tablet 3   senna-docusate (SENOKOT-S) 8.6-50 MG tablet Take 1 tablet by mouth at bedtime as needed for mild constipation.     simethicone (MYLICON) 80 MG chewable tablet Chew 1 tablet (80 mg total) by mouth 4 (four) times daily as needed for flatulence. 30 tablet 0   tamsulosin (FLOMAX) 0.4 MG CAPS capsule Take 1 capsule (0.4 mg total) by mouth daily. 90 capsule 3   Vitamin D, Ergocalciferol, (DRISDOL) 1.25 MG (50000 UNIT) CAPS capsule Take 1 capsule (50,000 Units total) by mouth every 7 (seven) days. 12 capsule 3   No current facility-administered medications on file prior to visit.    Allergies:  No Known Allergies  Physical Exam General: Frail malnourished looking elderly African-American male seated, in no evident distress Head: head normocephalic and atraumatic.   Neck: supple with no carotid or supraclavicular bruits Cardiovascular: regular rate and rhythm, no murmurs Musculoskeletal: no deformity Skin:  no rash/petichiae Vascular:  Normal pulses all extremities  Neurologic Exam Mental Status: Awake and fully alert. Oriented to place and time. Recent and  remote memory diminished attention span, concentration and fund of knowledge poor mood and affect appropriate.  Mild dysarthria.  Slight nonfluent speech.  Good comprehension.  Able to name and appeared quite well. Cranial Nerves: Fundoscopic exam reveals sharp disc margins. Pupils equal, briskly reactive to light. Extraocular movements full without nystagmus. Visual fields show partial right hemianopsia to confrontation. Hearing intact. Facial sensation intact.  Moderate right lower facial weakness., tongue, palate moves normally and symmetrically.  Motor: Spastic right hemiplegia with contracture of the right wrist and fingers.  Right upper extremity proximal strength 3/5 with significant weakness distally of the grip and hand 0/5 tone is increased on the right with spasticity.  Right lower extremity proximally 4/5 at the hip and knee right ankle foot drop 0/5.  Tone is increased on the right side  with spasticity. Sensory.: intact to touch , pinprick , position and vibratory sensation.  Coordination: Normal on the left and impaired on the right.   Gait and Station: Deferred as patient is in a wheelchair and unable to ambulate at baseline Reflexes: 2+ and asymmetric brisker on the right. Toes downgoing.   NIHSS  8 Modified Rankin  4   ASSESSMENT: 77 year old African-American male with left middle cerebral artery infarct likely due to artery to artery embolism from chronic left carotid occlusion.  He has significant residual spastic right hemiplegia.  Vascular risk factors of hypertension, hyperlipidemia , peripheral vascular disease, remote smoking and substance abuse and carotid disease     PLAN:I had a long d/w patient and his cousin about his recent stroke, residual spastic hemiplegia, carotid occlusion risk for recurrent stroke/TIAs, personally independently reviewed imaging studies and stroke evaluation results and answered questions.Continue aspirin 81 mg daily  for secondary stroke  prevention and maintain strict control of hypertension with blood pressure goal below 130/90, diabetes with hemoglobin A1c goal below 6.5% and lipids with LDL cholesterol goal below 70 mg/dL. I also advised the patient to eat a healthy diet with plenty of whole grains, cereals, fruits and vegetables, exercise regularly and maintain ideal body weight .recommend referral to outpatient physical occupational and speech therapy.  Followup in the future with my nurse practitioner in 6 months or call earlier if necessary.  Greater than 50% time during this 45-minute consultation visit was spent on counseling and coordination of care about his MCA stroke, hemiaplasia and carotid occlusion and answering questions.  Delia Heady, MD Note: This document was prepared with digital dictation and possible smart phrase technology. Any transcriptional errors that result from this process are unintentional.

## 2023-08-28 ENCOUNTER — Other Ambulatory Visit: Payer: Self-pay | Admitting: Family Medicine

## 2023-11-05 ENCOUNTER — Other Ambulatory Visit: Payer: Self-pay | Admitting: Family Medicine

## 2023-11-18 ENCOUNTER — Telehealth: Payer: Self-pay | Admitting: Family Medicine

## 2023-11-18 NOTE — Telephone Encounter (Signed)
Pt passed away 09/20/2023.

## 2023-11-25 ENCOUNTER — Ambulatory Visit: Payer: Self-pay | Admitting: Family Medicine
